# Patient Record
Sex: Male | Born: 1940 | Race: Black or African American | Hispanic: No | Marital: Married | Smoking: Former smoker
Health system: Southern US, Community
[De-identification: ages and names within clinical notes are randomized; demographics above are authoritative.]

## PROBLEM LIST (undated history)

## (undated) ENCOUNTER — Emergency Department (HOSPITAL_COMMUNITY): Admission: EM | Payer: PPO

## (undated) DIAGNOSIS — I2510 Atherosclerotic heart disease of native coronary artery without angina pectoris: Secondary | ICD-10-CM

## (undated) DIAGNOSIS — K219 Gastro-esophageal reflux disease without esophagitis: Secondary | ICD-10-CM

## (undated) DIAGNOSIS — I499 Cardiac arrhythmia, unspecified: Secondary | ICD-10-CM

## (undated) DIAGNOSIS — E785 Hyperlipidemia, unspecified: Secondary | ICD-10-CM

## (undated) DIAGNOSIS — R002 Palpitations: Secondary | ICD-10-CM

## (undated) DIAGNOSIS — K279 Peptic ulcer, site unspecified, unspecified as acute or chronic, without hemorrhage or perforation: Secondary | ICD-10-CM

## (undated) DIAGNOSIS — N400 Enlarged prostate without lower urinary tract symptoms: Secondary | ICD-10-CM

## (undated) DIAGNOSIS — M1990 Unspecified osteoarthritis, unspecified site: Secondary | ICD-10-CM

## (undated) DIAGNOSIS — I209 Angina pectoris, unspecified: Secondary | ICD-10-CM

## (undated) DIAGNOSIS — I10 Essential (primary) hypertension: Secondary | ICD-10-CM

## (undated) HISTORY — DX: Atherosclerotic heart disease of native coronary artery without angina pectoris: I25.10

## (undated) HISTORY — PX: HERNIA REPAIR: SHX51

---

## 1980-06-04 HISTORY — PX: INGUINAL HERNIA REPAIR: SUR1180

## 2000-05-29 ENCOUNTER — Encounter: Payer: Self-pay | Admitting: Orthopedic Surgery

## 2000-05-29 ENCOUNTER — Ambulatory Visit (HOSPITAL_COMMUNITY): Admission: RE | Admit: 2000-05-29 | Discharge: 2000-05-29 | Payer: Self-pay | Admitting: Orthopedic Surgery

## 2001-02-13 ENCOUNTER — Ambulatory Visit (HOSPITAL_COMMUNITY): Admission: RE | Admit: 2001-02-13 | Discharge: 2001-02-13 | Payer: Self-pay | Admitting: Family Medicine

## 2001-02-13 ENCOUNTER — Encounter: Payer: Self-pay | Admitting: Family Medicine

## 2001-03-11 ENCOUNTER — Encounter: Payer: Self-pay | Admitting: Family Medicine

## 2001-03-11 ENCOUNTER — Ambulatory Visit (HOSPITAL_COMMUNITY): Admission: RE | Admit: 2001-03-11 | Discharge: 2001-03-11 | Payer: Self-pay | Admitting: Family Medicine

## 2001-06-26 ENCOUNTER — Encounter: Payer: Self-pay | Admitting: Emergency Medicine

## 2001-06-26 ENCOUNTER — Emergency Department (HOSPITAL_COMMUNITY): Admission: EM | Admit: 2001-06-26 | Discharge: 2001-06-26 | Payer: Self-pay | Admitting: Emergency Medicine

## 2001-06-27 ENCOUNTER — Inpatient Hospital Stay (HOSPITAL_COMMUNITY): Admission: RE | Admit: 2001-06-27 | Discharge: 2001-06-29 | Payer: Self-pay | Admitting: Family Medicine

## 2003-06-05 HISTORY — PX: BACK SURGERY: SHX140

## 2003-09-03 HISTORY — PX: LUMBAR LAMINECTOMY: SHX95

## 2003-09-04 ENCOUNTER — Inpatient Hospital Stay (HOSPITAL_COMMUNITY): Admission: RE | Admit: 2003-09-04 | Discharge: 2003-09-10 | Payer: Self-pay | Admitting: Orthopedic Surgery

## 2003-09-18 ENCOUNTER — Emergency Department (HOSPITAL_COMMUNITY): Admission: EM | Admit: 2003-09-18 | Discharge: 2003-09-19 | Payer: Self-pay | Admitting: Emergency Medicine

## 2003-12-01 ENCOUNTER — Ambulatory Visit (HOSPITAL_COMMUNITY): Admission: RE | Admit: 2003-12-01 | Discharge: 2003-12-01 | Payer: Self-pay | Admitting: *Deleted

## 2003-12-01 ENCOUNTER — Encounter (INDEPENDENT_AMBULATORY_CARE_PROVIDER_SITE_OTHER): Payer: Self-pay | Admitting: *Deleted

## 2006-02-22 ENCOUNTER — Inpatient Hospital Stay (HOSPITAL_COMMUNITY): Admission: EM | Admit: 2006-02-22 | Discharge: 2006-02-23 | Payer: Self-pay | Admitting: Emergency Medicine

## 2006-02-22 HISTORY — PX: CARDIAC CATHETERIZATION: SHX172

## 2007-02-21 ENCOUNTER — Ambulatory Visit (HOSPITAL_COMMUNITY): Admission: RE | Admit: 2007-02-21 | Discharge: 2007-02-21 | Payer: Self-pay | Admitting: Family Medicine

## 2008-04-26 ENCOUNTER — Inpatient Hospital Stay (HOSPITAL_COMMUNITY): Admission: EM | Admit: 2008-04-26 | Discharge: 2008-04-28 | Payer: Self-pay | Admitting: Cardiovascular Disease

## 2008-04-26 ENCOUNTER — Encounter: Payer: Self-pay | Admitting: Emergency Medicine

## 2008-04-27 HISTORY — PX: CARDIAC CATHETERIZATION: SHX172

## 2009-10-03 HISTORY — PX: NM MYOCAR PERF WALL MOTION: HXRAD629

## 2010-03-19 ENCOUNTER — Inpatient Hospital Stay (HOSPITAL_COMMUNITY)
Admission: EM | Admit: 2010-03-19 | Discharge: 2010-03-22 | Payer: Self-pay | Source: Home / Self Care | Admitting: Emergency Medicine

## 2010-03-20 ENCOUNTER — Ambulatory Visit: Payer: Self-pay | Admitting: Internal Medicine

## 2010-03-21 ENCOUNTER — Encounter (INDEPENDENT_AMBULATORY_CARE_PROVIDER_SITE_OTHER): Payer: Self-pay | Admitting: Family Medicine

## 2010-08-16 LAB — BASIC METABOLIC PANEL
BUN: 17 mg/dL (ref 6–23)
Chloride: 104 mEq/L (ref 96–112)
Glucose, Bld: 183 mg/dL — ABNORMAL HIGH (ref 70–99)
Potassium: 4.4 mEq/L (ref 3.5–5.1)

## 2010-08-16 LAB — DIFFERENTIAL
Basophils Absolute: 0.1 10*3/uL (ref 0.0–0.1)
Basophils Relative: 1 % (ref 0–1)
Eosinophils Absolute: 0.2 10*3/uL (ref 0.0–0.7)
Eosinophils Relative: 4 % (ref 0–5)
Eosinophils Relative: 4 % (ref 0–5)
Lymphs Abs: 0.9 10*3/uL (ref 0.7–4.0)
Lymphs Abs: 1.2 10*3/uL (ref 0.7–4.0)
Monocytes Absolute: 0.6 10*3/uL (ref 0.1–1.0)
Monocytes Relative: 11 % (ref 3–12)
Monocytes Relative: 12 % (ref 3–12)
Neutro Abs: 3.3 10*3/uL (ref 1.7–7.7)

## 2010-08-16 LAB — CBC
HCT: 31.6 % — ABNORMAL LOW (ref 39.0–52.0)
Hemoglobin: 10.7 g/dL — ABNORMAL LOW (ref 13.0–17.0)
Hemoglobin: 11.4 g/dL — ABNORMAL LOW (ref 13.0–17.0)
Hemoglobin: 11.6 g/dL — ABNORMAL LOW (ref 13.0–17.0)
MCH: 29.1 pg (ref 26.0–34.0)
MCH: 29.2 pg (ref 26.0–34.0)
MCV: 86.1 fL (ref 78.0–100.0)
MCV: 86.7 fL (ref 78.0–100.0)
MCV: 86.8 fL (ref 78.0–100.0)
RBC: 3.67 MIL/uL — ABNORMAL LOW (ref 4.22–5.81)
RBC: 3.91 MIL/uL — ABNORMAL LOW (ref 4.22–5.81)
RBC: 3.97 MIL/uL — ABNORMAL LOW (ref 4.22–5.81)
WBC: 5.7 10*3/uL (ref 4.0–10.5)

## 2010-08-16 LAB — GLUCOSE, CAPILLARY
Glucose-Capillary: 107 mg/dL — ABNORMAL HIGH (ref 70–99)
Glucose-Capillary: 128 mg/dL — ABNORMAL HIGH (ref 70–99)
Glucose-Capillary: 135 mg/dL — ABNORMAL HIGH (ref 70–99)
Glucose-Capillary: 142 mg/dL — ABNORMAL HIGH (ref 70–99)
Glucose-Capillary: 144 mg/dL — ABNORMAL HIGH (ref 70–99)
Glucose-Capillary: 149 mg/dL — ABNORMAL HIGH (ref 70–99)
Glucose-Capillary: 88 mg/dL (ref 70–99)

## 2010-08-16 LAB — HEMOGLOBIN AND HEMATOCRIT, BLOOD
HCT: 32.1 % — ABNORMAL LOW (ref 39.0–52.0)
Hemoglobin: 10.4 g/dL — ABNORMAL LOW (ref 13.0–17.0)
Hemoglobin: 10.7 g/dL — ABNORMAL LOW (ref 13.0–17.0)

## 2010-08-16 LAB — POCT CARDIAC MARKERS
CKMB, poc: 1.3 ng/mL (ref 1.0–8.0)
CKMB, poc: <1 ng/mL — ABNORMAL LOW (ref 1.0–8.0)
Myoglobin, poc: 80.2 ng/mL (ref 12–200)
Troponin i, poc: <0.05 ng/mL (ref 0.00–0.09)

## 2010-08-16 LAB — TYPE AND SCREEN
ABO/RH(D): O POS
Antibody Screen: NEGATIVE

## 2010-10-17 NOTE — Cardiovascular Report (Signed)
NAMEGAUGE, WINSKI            ACCOUNT NO.:  [id number]   MEDICAL RECORD NO.:  [id number]          PATIENT TYPE:  INP   LOCATION:  2003                         FACILITY:  [facility]   PHYSICIAN:  Richard A. Alanda Amass, M.D.DATE OF BIRTH:  1940-11-12   DATE OF PROCEDURE:  04/27/2008  DATE OF DISCHARGE:                            CARDIAC CATHETERIZATION   PROCEDURES:  Retrograde central aortic catheterization, selective  coronary angiography by Judkins technique, left ventricular angiogram  LAO projection, abdominal aortic angiogram, midstream PA projection,  selective left renal angiogram, hand injection.   PROCEDURE:  The patient was brought to second floor CP lab in a  postabsorptive state after premedication of 5 mg Valium p.o.  Given 2 mg  of IV Versed for sedation in the laboratory.  A 1% Xylocaine was used  throughout the procedure.  The right groin was prepped, draped in the  usual manner.  The CRFA was entered with a single anterior puncture  using 18 thin-wall needle.  He was relatively thin and had a very  superficial vessel that appeared somewhat tortuous, but this was a  single anterior puncture.  A 6-French short sidearm sheath was inserted  without difficulty and study was done using guidewire exchange and 6-  French 4-cm taper Cordis preformed coronary and pigtail catheters.  Selective coronary angiography was performed followed by LV angiogram in  the RAO and LAO projection at 25 mL, 14 mL per second and 20 mL, 12 mL  per second.  Pullback pressure of CA was performed showed no gradient  across the aortic valve.  Catheter was pulled down above the level of  the renal arteries and abdominal aortic angiogram was done in the  midstream PA projection at 25 mL, 20 mL per second.  Because of concern  of dual, left renal arteries overlap, and possible stenosis, selective  left renal angiography was done with the 6-French right coronary  catheter.  Catheters were removed.   Side-arm sheath was flushed.  Because of relative hypertension of 160 to 170 mmHg with maintenance of  sinus rhythm.  The patient was given 20 mg of labetalol for blood  pressure elevation which brought this down to 130-140 range.  Initial  attempt at Angio-Seal closure was not successful because of the  superficial artery and a relatively stiff angle, the sheath would not  going smoothly, so this was abandoned.  A 6-French sheath was then put  back in easily and the patient was brought to the holding area for ACT  measurement, sheath removal, and pressure hemostasis.  He tolerated the  procedure well.   Pressures:  LV:  160/0; LVEDP 18 mmHg.   CA:  160/85 mmHg.   There was no gradient across the aortic valve on catheter pullback.   Fluoroscopy did not reveal any significant coronary, intracardiac, or  valvular calcification.   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  Selective left renal angiography because of  overlap showed approximately 30% narrowing of the superior left renal  artery in the proximal portion and no significant narrowing of the  posterior  segment inferior left renal artery.  The right renal artery is  single, widely patent and normal.  The IMA was intact and the proximal  iliacs appeared intact.  There was no infrarenal aneurysm or stenosis.   LV angiogram in the RAO and LAO projection showed a normally contracting  LV with angiographic LVH of at least a moderate degree and EF of  approximately 60% and no mitral regurgitation or segment.  There was  very minimal late systolic relaxation of the mid anterolateral wall  (minimal hypokinesis).   The main left coronary artery was long, widely patent, and normal.  The  coronary arteries were all quite large and tortuous except for the  affected DX1.   The LAD course to the apex of the heart, was tortuous, bifurcated.  There were irregularities with less  than 20% after SP1 and DX1 and there  was a 40% eccentric narrowing in the mid-to-distal third of the LAD, but  a large residual lumen just before the small DX2.  The remainder of the  LAD had no significant disease and normal flow.   There was a moderately long, but very thin DX1.  A normal superior  bifurcation branch, but the inferior bifurcation branch was 100%  occluded with some late filling beyond this.  This is in the area of  past angiographic stenosis at cath of 607 (70%).   There was a small, thin optional diagonal that was normal.  The  circumflex was tortuous and large, widely patent, mild irregularity in  the proximal third with normal 4 marginal branches from the AV groove,  circ, and a large atrial branch that was normal proximally.   The right coronary was a dominant vessel.  There was 30% irregularity  and narrowing of the proximal third.  The remainder of the vessel was  widely patent.  The PLA bifurcated and the PDA was single, both were  normal.  Two RV branches were normal from the proximal mid RCA.   DISCUSSION:  This 70 year old Afro American, married, father of 4 (3  living) with 13 grandchildren, is a remote smoker.  He has AODM,  hypertension, and hyperlipidemia on therapy.  He underwent  catheterization for chest pain by Dr. Tresa Endo on June 2007 and had 60-70%  narrowing of the inferior bifurcation branch of the DX1.  He has done  well on medical therapy long-term; however, over the last 3 days, he has  had intermittent chest fullness and pressure that has been unlike his  occasional reflux symptoms.  He has severe nonradiating chest pain,  prompting him going to [facility] on April 26, 2008.  He was  transferred to [facility] on the same evening.  Initial enzymes showed mild  elevation of troponin, mild elevation of CPK to 220 and MB to 126 with  troponin 1.13, all compatible with mild ischemia.  There are no acute  EKG changes, but he did have nonspecific ST-T  changes in the anterior  leads that look like repolarization and were not evolutionary on his  first EKGs.   The patient's chest pain is most likely related to occlusion of the  inferior bifurcation branch of the DX1.  This is probably completed with  a minimal wall motion abnormality of the mid anterolateral wall, well-  preserved global EF, and very mild coronary disease of the LAD and right  as outlined above.  I would recommend continued medical therapy, status  post small lateral MI with continued medical therapy of  his  hypertension, diabetes, and lipids.   Follow up with Dr. [name] and then cardiac followup with Dr.  Nanetta Batty.  We would probably need to keep him in the hospital  another 24-48 hours for cardiac monitoring.   CATHETERIZATION DIAGNOSES:  1. Unstable angina 3 days' duration resulting in small lateral      myocardial infarction from inferior bifurcation branch DX      occlusion.  2. Otherwise noncritical coronary artery disease, mild as outlined      above.  3. Systemic hypertension - left ventricular hypertrophy.  4. No significant renal artery stenosis.  5. Adult-onset diabetes mellitus.  6. Hyperlipidemia.  7. Remote smoker.  8. History of gastroesophageal reflux disease.      Richard A. Alanda Amass, M.D.  Electronically Signed     RAW/MEDQ  D:  04/27/2008  T:  04/28/2008  Job:  [id number]   cc:   Nanetta Batty, M.D.  Mila Homer. Sudie Bailey, M.D.  Cath Lab

## 2010-10-17 NOTE — Discharge Summary (Signed)
Angel Dixon, Angel Dixon            ACCOUNT NO.:  [id number]   MEDICAL RECORD NO.:  [id number]          PATIENT TYPE:  INP   LOCATION:  2003                         FACILITY:  [facility]   PHYSICIAN:  Nanetta Batty, M.D.   DATE OF BIRTH:  1940-08-05   DATE OF ADMISSION:  04/26/2008  DATE OF DISCHARGE:  04/28/2008                               DISCHARGE SUMMARY   DISCHARGE DIAGNOSES:  1. 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  2. Non-insulin-dependent diabetes.  3. Treated hypertension.  4. Treated dyslipidemia.  5. Minor coronary disease at catheterization in 2007.  6. Preserved left ventricular function.  7. SULFA allergy.   HOSPITAL COURSE:  Angel Dixon is a pleasant 70 year old male who has  been seen in the past by Dr. Allyson Sabal.  He had a catheterization in  September 2007 for chest pain.  This showed a 60-70% branch narrowing  off a diagonal.  Remainder of his coronaries were essentially without  significant stenosis.  He had good LV function and normal renal arteries  and normal iliac arteries.  He does have risk factors for coronary  disease including hypertension, dyslipidemia, and diabetes.  He is a  remote smoker.  He quit 20 years ago.  He is retired from [employer].  He does have a family history that his father died of some heart trouble  in his 73s.  The patient presented to [facility] ER on April 26, 2008  with chest pain, worrisome for unstable angina.  His initial CK was 220  with 12.6 MBs and a troponin of 1.13.  He was transferred to [facility] and  was started on heparin, nitrates, and aspirin.  The patient was pain-  free and he had no acute EKG changes, so he was not transferred until a  bed was available which was late on the April 26, 2008.  He was taken  the cath lab on April 27, 2008 by Dr. Alanda Amass.  This revealed a 30%  dominant RCA, normal left main, normal circumflex, normal  optional  diagonal, 40% narrowing in the mid LAD prior to the takeoff of the  second diagonal, large first diagonal with an occluded branch that had  previously been 60-70% in 2007.  EF was normal, he did have some LVH.  His initial CK of 200 with 12 MB was his peak.  By April 28, 2008,  his CK-MB were normal at 110 with 2.8 MB and a negative troponin.  He  has not had arrhythmias.  His EKG shows no acute changes.  We feel he  can be discharged later on April 28, 2008.  He will follow up with  Dr. Allyson Sabal in [place].  We did make some adjustments in his  medications, he had been on Pravachol at home for generic reasons, the  patient tells me he had been on Lipitor in the past without problems but  could not afford it.  Although, I had to change him over to simvastatin  which may be more effective.  He will need a followup lipid panel at  some point in about 6-8 weeks, his LDL was 104.   DISCHARGE MEDICATIONS:  1. Lisinopril 10 mg a day.  2. Glipizide 10 mg a day.  3. Aspirin 81 mg 2 tablets a day.  4. Centrum Silver once a day.  5. Ranitidine 150 mg twice a day.  6. Metoprolol ER 50 mg a day.  7. Simvastatin 80 mg a day.  8. Nitroglycerin 0.4 mg sublingual p.r.n.  9. The patient will resume his metformin 850 mg twice a day on      April 30, 2008.   LABORATORY DATA:  TSH is 1.9.  Cholesterol is 171, LDL 104, and HDL 30.  CK peaked at 220 with 12 MBs.  At discharge, his sodium was 135,  potassium 4.2, BUN 8, and creatinine 0.88.  White count 5.3, hemoglobin  13.1, hematocrit 38.4, and platelets 227.  Chest x-ray shows no active  disease.   DISPOSITION:  The patient is discharged in stable condition and followup  will be arranged with Dr. Allyson Sabal either in [place] or in [place].  He has been instructed not to drive or perform any strenuous activity  for at least a week.      Angel Dixon, P.A.      Nanetta Batty, M.D.  Electronically Signed    LKK/MEDQ   D:  04/28/2008  T:  04/29/2008  Job:  [id number]   cc:   Mila Homer. Sudie Bailey, M.D.

## 2010-10-20 NOTE — H&P (Signed)
NAME:  Angel Dixon, Angel Dixon                      ACCOUNT NO.:  [id number]   MEDICAL RECORD NO.:  [id number]                   PATIENT TYPE:  AMB   LOCATION:  DAY                                  FACILITY:  [facility]   PHYSICIAN:  Marlowe Kays, M.D.               DATE OF BIRTH:  [birth year]   DATE OF ADMISSION:  09/03/2003  DATE OF DISCHARGE:                                HISTORY & PHYSICAL   CHIEF COMPLAINT:  Pain in my back and legs.   HISTORY OF PRESENT ILLNESS:  This 70 year old black male who has been seen  by [place] for continuing progressive problems concerning his low back with  radiculitis into the lower extremities. The patient has had epidural steroid  injections in the back by Dr. Sheran Luz which unfortunately has not  truly ameliorated his discomfort. He has been tried on nonsteroidal anti-  inflammatories. 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 Quite severe stenosis was seen at  L4-5, moderate at L3-4, and mild at L2-3.  No fracture or acute abnormality  was seen other than mentioned.  Due to these continuing findings and the  fact that the patient has not responded to conservative care, it was felt  that he would benefit with surgical intervention now with central  decompressive lumbar laminectomy at L2-3, L3-4, and L4-5 with possible  diskectomy at L4-5. The potential risks and complications of the procedure  were explained to him by Dr. Marlowe Kays. We decided to go ahead with  the above procedure.   PAST MEDICAL HISTORY:  This patient has no medical allergies. His current  medications are Lipitor 40 mg one daily, Avandia 8 mg one daily, glipizide  10 mg one b.i.d., Nexium 40 mg one daily, lisinopril/hydrochlorothiazide  10/12.5 mg one daily, aspirin 81 mg per day (will stop five days prior to  surgery). He has hypertension, GERD,  hyperlipidemia, and type 2 diabetes.  His family physician is Dr. [name] in [place], [place].  Past surgical includes endoscopy about two years ago. No other major  surgeries.   FAMILY HISTORY:  Noncontributory.   SOCIAL HISTORY:  The patient is married, works for [employer]. Has no intake of tobacco products. Has moderate amount of intake of  ETOH. He has four children and his caregiver after surgery will be his wife.   REVIEW OF SYSTEMS:  CNS:  Seizures, stroke, paralysis, or double vision, but  the patient does have radiculitis consistent with spinal stenosis of the  lower extremities . No fever, chills, night sweats. RESPIRATORY:  No  productive cough, hemoptysis, or shortness of breath. CARDIOVASCULAR: No  chest pain, angina, or orthopnea. GI: No nausea, vomiting, melena, or bloody  stool. GENITOURINARY: No discharge, dysuria, or hematuria. MUSCULOSKELETAL:  Primarily in present illness.   PHYSICAL EXAMINATION:  VITAL  SIGNS: Pulse 82, respirations 12, blood  pressure 140/80.  GENERAL: Alert, cooperative, and friendly 70 year old black male who is  accompanied by his wife. He is well groomed and fully alert.  HEENT:  Normocephalic. PERRLA. Oropharynx is clear. Wears dental prosthesis.  NECK: Supple. No lymphadenopathy.  CHEST: Clear to auscultation. No rales or rhonchi.  HEART: Regular rate and rhythm.  No murmurs are heard.  ABDOMEN: Soft, nontender. Liver and spleen not felt.  RECTAL/GENITALIA: Not done; not pertinent to the present illness.  EXTREMITIES: Negative straight leg raise bilaterally.  NEUROLOGIC: Intact in lower extremities.   ADMISSION DIAGNOSES:  1. Spinal stenosis, L2-3, L3-4, and L4-5.  2. Hypertension.  3. Type 2 diabetes.  4. Hyperlipidemia.   PLAN:  The patient will undergo central decompressive lumbar laminectomy at  L2 through L5 with possible diskectomy at L4.     Dooley L. Cherlynn June.                 Marlowe Kays, M.D.    DLU/MEDQ  D:  09/02/2003  T:  09/02/2003  Job:  [id number]   cc:   Mila Homer. Sudie Bailey, M.D.  [address] [place], [place] [address]  Fax: [phone]
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
## 2010-10-20 NOTE — Consult Note (Signed)
NAME:  Angel Dixon, Angel Dixon                      ACCOUNT NO.:  [id number]   MEDICAL RECORD NO.:  [id number]                   PATIENT TYPE:  INP   LOCATION:  0473                                 FACILITY:  [facility]   PHYSICIAN:  James L. Malon Kindle., M.D.          DATE OF BIRTH:  09/09/1940   DATE OF CONSULTATION:  09/06/2003  DATE OF DISCHARGE:                                   CONSULTATION   REASON FOR CONSULTATION:  Postoperative ileus.   HISTORY:  A nice 70 year old patient of Dr. Bridgett Larsson up in [place],  who has pain in his back and legs.  He has had progressive problems and has  been treated with steroid injections, medications, etc.  He has developed  spinal stenosis.  It is felt that he would benefit from an elective  decompressive lumbar laminectomy, which was performed on April 1.  Since  that procedure, the patient has had problems passing bowel movements and has  become progressively more distended.  The most recent labs have shown a  potassium of 3.6.  He has had enemas which have helped some.  An acute  abdominal series has shown a postoperative ileus with increased air in the  colon with a small bowel.   MEDICATIONS ON ADMISSION:  Lipitor, Avandia, glipizide, Nexium, lisinopril,  hydrochlorothiazide, aspirin.   ALLERGIES:  1. SULFA.  2. VIOXX.   PAST MEDICAL HISTORY:  He has non-insulin-dependent diabetes.  This is  treated with oral agents.  He also has hypertension and esophageal reflux  disease.   He had an endoscopy a couple of years ago.  He has never had any other  abdominal surgeries.   FAMILY HISTORY:  Noncontributory.   SOCIAL HISTORY:  He is married and works for [employer].  Does not smoke.  Drinks alcohol intermittently.  Has four children.   REVIEW OF SYSTEMS:  Remarkable for some constipation but generally speaking,  no problems with his bowels.   PHYSICAL EXAMINATION:  VITAL SIGNS:  Patient is afebrile.  Vital signs are  unremarkable.  GENERAL:  An alert, oriented African-American male in no acute distress.  HEENT:  Eyes:  Sclerae are anicteric.  NECK:  Supple.  No lymphadenopathy.  LUNGS:  Clear.  HEART:  Regular rate and rhythm.  No murmurs or gallops.  ABDOMEN:  Distended with a few bowel sounds.  Soft and nontender.   ASSESSMENT:  Postoperative ileus, probably due to the combination of a  spinal surgery and hypokalemia.   PLAN:  Will continue on clear liquids and will try to get his potassium up  to 4.5 to 5 for several days and see how he does clinically.                                               James L. Malon Kindle.,  M.D.    JLE/MEDQ  D:  09/06/2003  T:  09/07/2003  Job:  [id number]   cc:   Marlowe Kays, M.D.  [address]  [place]  [place] [address]  Fax: [phone]   Lacy Duverney, M.D.

## 2010-10-20 NOTE — Group Therapy Note (Signed)
[facility]  Patient:    Angel Dixon, Angel Dixon Visit Number: [id number] MRN: [id number]          Service Type: MED Location: 2A A226 01 Attending Physician:  [name] Dictated by:   [name], M.D. Admit Date:  06/27/2001                               Progress Note  SUBJECTIVE:  The patient is really feeling better.  He is in no distress, no pain.  OBJECTIVE:  Temperature 99.3, pulse 81, respiratory rate 20, blood pressure 122/74.  He is supine in bed, oriented, and alert, in no acute distress. Heart has a regular rhythm without murmur, rate of 70.  The lungs are clear throughout, moving air well.  The abdomen is soft, without hepatosplenomegaly or mass.  No tenderness.  Bowel sounds normal.  Extremities normal.  Hemoglobin today is 9.9.  He had an EGD through Dr. Karilyn Cota, gastroenterologist, who found on this two bulbar ulcers with stigmata of bleed.  These were coagulated.  There was also erosive gastritis.  Also, problem #2 is diabetes.  Accu-Chek today 169.  PLAN:  Continue with liquids.  Start a 4-gram sodium diet.  If his hemoglobin is stable in the morning, he will be able to be discharged home. Dictated by:   [name], M.D. Attending Physician:  [name] DD:  06/28/01 TD:  06/29/01 Job: [id number]

## 2010-10-20 NOTE — Consult Note (Signed)
[facility]  Patient:    Angel Dixon, Angel Dixon Visit Number: [id number] MRN: [id number]          Service Type: MED Location: 2A A226 01 Attending Physician:  Angel Dixon Dictated by:   Angel Dixon, M.D. Proc. Date: 06/27/01 Admit Date:  06/27/2001                            Consultation Report  REASON FOR CONSULTATION:  Melena and anemia.  HISTORY OF PRESENT ILLNESS:  [name] is a 70 year old African-American male who was admitted to [facility] service this afternoon with weakness, melena, and anemia.  The patient was in his usual state of health until yesterday afternoon when he was at work.  He lifted a box of cigarettes and he felt very light-headed and dizzy.  This passed within a few minutes and he was able to finish his shift. He drove back home.  When he sat down to eat his evening meal, he did not feel well and he did not have a good appetite.  However, he did not throw up or have abdominal pain.  He did have just one episode of chest tightness which resolved spontaneously.  Later in the evening, he stood up and he had very transient syncopal spell.  His wife helped him.  He stood up and slumped over to the ground a second time.  He did not have a frank fall.  He was brought to the emergency room by ambulance.  He was evaluated.  His hemoglobin was in the 11 range.  He was given IV fluids.  He felt better and he was discharged with advice to follow with Angel Dixon.  He did have tarry stool this morning. Angel Dixon did a rectal exam and noted his stool to be black and strongly guaiac positive.  He has not had a spell like that but he still feels weak and is not hungry.  He denies heartburn, dysphagia, hematemesis, abdominal pain, fresh blood per rectum, or recent change in his bowel habits.  He has a good appetite.  He had back problems a couple of months ago.  He was evaluated by neurosurgeon.  He received three epidural  shots with significant relief of his pain.  He also was begun on Naprosyn 500 mg but he does not take it every day. He is also on ASA 81 mg q.d. and Anacin on a p.r.n. basis.  Other medications include Prinivil ? 10 mg q.d. and glipizide q.d.  CURRENT MEDICATIONS: 1. Naprosyn 500 mg. 2. ASA 81 mg q.d. 3. Anacin on a p.r.n. basis. 4. Prinivil ? 10 mg q.d. 5. Glipizide q.d.  REVIEW OF SYSTEMS:  Negative for shortness of breath, fever or chills, hematuria, or dysuria.  There is no history of peptic ulcer disease.  PAST MEDICAL HISTORY: 1. Hypertension. 2. Diabetes mellitus.  Both of these were diagnosed about five years ago. 3. About four and a half years ago he had a screening flexible sigmoidoscopy    and ______ which was normal. 4. He had a right inguinal herniorrhaphy years ago. 5. 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 6. He had a stress test about three months ago which was within normal limits.  ALLERGIES:  SULFA caused rash and itching.  He is allergic to another medication but does not remember the name.  FAMILY HISTORY:  Both parents are  deceased.  They died in their 50s.  Father had some heart problems and mother had renal problems.  SOCIAL HISTORY:  He is married.  He has three daughters and one son.  One of his daughters is diabetic.  She is on oral hypoglycemics and takes insulin on a p.r.n. basis.  Other children are in good health.  He quit cigarette smoking 13 years ago after having smoked for 25-30 years.  He drinks alcohol no more than a couple of drinks a month.  PHYSICAL EXAMINATION:  GENERAL:  The patient is a pleasant well-developed, well-nourished African-American male who is in no acute distress.  VITAL SIGNS:  His usual weight is 165 pounds.  He has not been weighed yet. He is 5 feet 8 inches tall.  Pulse 112 per minute, blood pressure 123/80, respirations 20, and temperature 97.8.  He was noted to  be orthostatic in [facility] Dixon office.  HEENT:  Conjunctivae is pale.  Sclerae is nonicteric.  Oropharyngeal mucosa is normal.  He has complete upper and partial lower dental plate.  NECK:  Without masses or thyromegaly.  JVD is flat.  Carotids are 2+ bilaterally and without bruits.  CARDIAC:  Regular rhythm, normal S1 and S2.  No murmur or gallop noted.  LUNGS:  Clear to auscultation.  ABDOMEN:  Symmetrical bowel sounds and normal.  Palpation reveals a soft abdomen.  He has mild mid epigastric tenderness but no organomegaly or masses.  RECTAL:  Deferred as he had one by Angel Dixon today.  EXTREMITIES:  He does not have clubbing or koilonychia.  LABORATORY DATA:  Are pending.  ASSESSMENT:  Angel Dixon is a 70 year old African-American male who presents with melena and anemia with associated symptoms of weakness and lightheadedness.  He has been on Naprosyn p.r.n. and he also takes low dose aspirin and p.r.n. Anacin.  I suspect he has upper gastrointestinal bleed secondary to peptic ulcer disease due to nonsteroidal anti-inflammatory drug therapy.  Whether or not he has Helicobacter pylori infection remains to be seen.  I suspect his hemoglobin has dropped significantly since yesterday as he has a resting sinus tachycardia.  He needs to undergo esophagogastroduodenoscopy both for diagnostic and therapeutic purposes. Unfortunately, he decided to eat some food before he came to the hospital and therefore it would be best for me to wait until tomorrow morning unless he were to show signs of active bleeding.  PLAN: 1. I agree with the use of IV proton pump inhibitors and IV fluids. 2. Will consider packed red blood cells if hemoglobin drops below 8 grams. 3. Esophagogastroduodenoscopy in the a.m. 4. I have reviewed the procedure with the patient and his wife, and they are    agreeable.  I would like to thank Angel Dixon for sharing the care of this gentleman. Dictated by:    Angel Dixon, M.D. Attending Physician:  Angel Dixon DD:  06/27/01  TD:  06/28/01 Job: [id number]

## 2010-10-20 NOTE — Op Note (Signed)
NAME:  Angel Dixon, Angel Dixon                      ACCOUNT NO.:  [id number]   MEDICAL RECORD NO.:  [id number]                   PATIENT TYPE:  OBV   LOCATION:  0460                                 FACILITY:  [facility]   PHYSICIAN:  Marlowe Kays, M.D.               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   POSTOPERATIVE DIAGNOSES:  Severe spinal stenosis L2-3, L3-4, and L4-5.   OPERATION:  Central decompressive laminectomy L2-3, L3-4, L4-5 right with  inspection of the L4-5 disk right.   SURGEON:  Marlowe Kays, M.D.   ASSISTANT:  Georges Lynch. Darrelyn Hillock, M.D.   ANESTHESIA:  General.   PATHOLOGY AND JUSTIFICATION FOR PROCEDURE:  He has had progressive back and bilateral leg pain.  He had a myelogram done  actually in 2001 which demonstrated three level spinal stenosis most severe  at L4-5.  Because of the progression of symptoms, he is here today for the  above mentioned surgery. A recent MRI on August 11, 2003 demonstrated disk  protrusion on the right at L4-5 which I do not feel was the major  contributor of symptoms but we felt that it should be looked at anyway.   DESCRIPTION OF PROCEDURE:  Prophylactic antibiotics, satisfactory general  anesthesia, Foley catheter inserted, knee chest position on the Andrew's  frame. The back was prepped with Duraprep, draped in a sterile field. I made  an initial midline incision and tagged the two spinous processes in the  middle of the field with Kocher clamps and then took a lateral x-ray which  was co-read by the radiologist as well since this man has a transitional  vertebra at L5-S1.  It was felt by both Dr. Darrelyn Hillock and me and the  radiologist that the Kocher clamps were on the spinous process of L2 and L3.  I therefore did not do anymore  dissection proximal ward but opened the wound  caudad until I was able to dissect down to the L4-5 disk space. Two self  retaining McCullough retractors were inserted. With a combination of double  action rongeur and 2 and 3 mm Kerrison rongeurs, I first removed the spinous  processes and a portion of the neural arches of L2 except for the most  superior portion down to L5 removing the superior portion.  Then working in  the midline with the Kerrison rongeurs, we meticulously and carefully began  decompressing the spinal canal which was severely compressed by bone.  When  we had freed up as much of the meninges as we felt comfortable without using  the microscope, we brought in the microscope and completed the decompression  particularly at L4-5.  He had a very thick ligamentum flavum which was a  major contributor to his  compression. The neuroforamen bilaterally at L2-3,  L3-4 and L4-5 were all widely patent at the conclusion of the decompression.  We looked at the L4-5 disk. This appeared to be flat to Bristol 4 pressure  and the neuroforamen for the L5 nerve root was widely patent and it was not  felt that any microdiskectomy was indicated. I then irrigated the wound well  with sterile saline and Gelfoam was placed over the dura. I then placed a  1/4 inch Penrose drain through the left posterior flank on top of the  Gelfoam.  Self retaining retractors were removed. There was no unusual  bleeding.  I then closed the wound with interrupted #1 Vicryl in the  paralumbar muscle and fascia taking care under direct visualization to  avoid piercing the drain. The subcutaneous tissue was closed with 2-0 Vicryl  and skin with staples.  Betadine Adaptic dry sterile dressing were applied,  he was gently placed on his bed and taken to the recovery room in  satisfactory condition with no known complications. Estimated blood loss was  450 mL, no blood replacement.                                                Marlowe Kays, M.D.    JA/MEDQ  D:  09/03/2003  T:  09/04/2003  Job:  [id number]
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
## 2010-10-20 NOTE — Cardiovascular Report (Signed)
NAMESIDNEY, KANN NO.:  [id number]   MEDICAL RECORD NO.:  [id number]          PATIENT TYPE:  INP   LOCATION:  2807                         FACILITY:  [facility]   PHYSICIAN:  Nicki Guadalajara, M.D.     DATE OF BIRTH:  08-Jun-1940   DATE OF PROCEDURE:  DATE OF DISCHARGE:                              CARDIAC CATHETERIZATION   Audio too short to transcribe (less than 5 seconds)           ______________________________  Nicki Guadalajara, M.D.     TK/MEDQ  D:  02/22/2006  T:  02/22/2006  Job:  [id number]

## 2010-10-20 NOTE — Discharge Summary (Signed)
NAME:  DIMETRIUS, MONTFORT                      ACCOUNT NO.:  [id number]   MEDICAL RECORD NO.:  [id number]                   PATIENT TYPE:  INP   LOCATION:  0473                                 FACILITY:  [facility]   PHYSICIAN:  Marlowe Kays, M.D.               DATE OF BIRTH:  1941-03-12   DATE OF ADMISSION:  09/03/2003  DATE OF DISCHARGE:  09/10/2003                                 DISCHARGE SUMMARY   ADMISSION DIAGNOSES:  1. Spinal stenosis, L2-3, L3-4, and L4-5.  2. Hypertension.  3. Type 2 diabetes.  4. Hyperlipidemia.   DISCHARGE DIAGNOSES:  1. Spinal stenosis, L2-3, L3-4, and L4-5.  2. Hypertension.  3. Type 2 diabetes.  4. Hyperlipidemia.  5. Postoperative anemia treated with two units of autologous blood.  6. Postoperative ileus (resolved).  7. Hyponatremia (resolved).  8. Tinea cruris, treated with Nystatin.   OPERATION:  On September 03, 2003, the patient underwent central decompressive  lumbar laminectomy at L2-3, L3-4, and L4-5 on the right with inspection of  the L4 disk on the right. Dr. Ranee Gosselin assisted.   CONSULTATIONS:  Dr. Randa Evens and Dr. Luther Parody, gastroenterology.   BRIEF HISTORY:  This 70 year old black male seen for continuing problems  concerning his low back pain. He has radiation of pain and discomfort in  both lower extremities. He has failed conservative care including epidural  steroid injections, nonsteroidal anti-inflammatories.  MRI showed the three  level spinal stenosis. After potential risks and complications, the surgery  was discussed with the patient who decided to go ahead with the above  procedure.   HOSPITAL COURSE:  The patient tolerated the surgical procedure quite well.  He had marked relief in his leg pain. He had some soreness in his back. A  Penrose drain, which was placed postoperatively, was advanced and then  pulled over the weekend after surgery.  He had slight elevation in  temperature, and Tylenol relieved it. He has no  pulmonary symptomatology.  It was noted on postoperative day two, however, that he had complaints of  stomach tightness. The weekend call people noted that he had some distention  beginning. Mr. Clarene Reamer, PA-C, began the patient on ileus precautions.  Unfortunately,the patient went on to develop a full postoperative ileus with  KUB showing ileus pattern (this was repeated after treatment and was  resolving). Unfortunately, the patient did not respond in a timely fashion  to the protocol that we had given him. He had quiet bowel sounds, no flatus,  and certainly no BM. We asked Dr. Randa Evens and his group to see the patient.  It was noted that the patient had hyponatremia. This was corrected,  potassium was added as well as simethicone. Eventually, the patient began  having passing of flatus. Various suppositories and enemas also caused him  to have stool. He emptied stool the day prior to discharge. He was much more  comfortable. Bowel sounds were present. The  abdomen was soft at discharge.  Postoperatively, it was noted that the patient had a postoperative anemia  with his hemoglobin dropping down to 7.9. He was ashen, pale, and light-  headed. It was felt that indeed a postoperative anemia was present and  therefore discussed it with both him and his wife concerning transfusion. He  was transfused with two units of bank blood. This brought his hemoglobin up  to 10.1. He has less dizziness, felt better.  The patient also had an  intertriginous groin monilial infection and this was treated with Nystatin  powder.  On the day of discharge the patient had very little pain or  discomfort in the back or lower extremities. He was ambulating in the hall  as he had been postoperatively. His abdomen was soft, bowel sounds present.  He was voiding on his own and had had stool. The wound was dry.  Neurovascularly was intact in the lower extremities. Both him and his wife  in the room for discharge  instructions. He has passed a considerable amount  of flatus during the night.   Laboratory values in the hospital, hematologically, showed a preoperative  CBC with a very mild anemia, hemoglobin 12.7, hematocrit 38.0; otherwise  within normal limits.  Preoperative chemistry was normal with a glucose of  135.  Final sodium was 133 and glucose was normal at 88, BUN 33, creatinine  2.6. Urinalysis was negative for urinary tract infection.  Preoperative  chest x-ray showed no active lung disease. The KUB x-ray on September 05, 2003,  showed ileus pattern and when repeated on September 08, 2003, showed improving  postoperative ileus.  Electrocardiogram showed normal sinus rhythm with left  atrial enlargement.   CONDITION ON DISCHARGE:  Improved, stable.   PLAN:  The patient is discharged to his home in the care of his wife and  family. He is given both verbally and written explicit instructions to  return to see Dr. Simonne Come on September 17, 2003.  He is to use dry dressing  p.r.n. to the back, he may shower. He is to ambulate for comfort, be seated  for comfort, and supine for comfort.   He is to follow up with Dr. Sudie Bailey, his primary care physician, next week.  I told them he would call for an  appointment so he can be back on the  diabetic regimen Dr. Sudie Bailey has had him on.  We had to put him on sliding  scale in the hospital due to his NPO status.   The patient is follow up with Dr. Luther Parody in the not too distant future.  Dr. Luther Parody feels that the patient may have had a recurrence of an ulcer  problem as he had stool for positive blood times one while in the hospital.  He is recommended an upper endoscopy. I instructed him to call Dr. Luther Parody  for an  appointment at [phone]. The patient is to continue with his home  medications and diet per Dr. Michelle Nasuti office.  MEDICATIONS:  Tylox #30 one q.6h. p.r.n. pain and Robaxin 500 mg one q.6h.  p.r.n. muscle spasm. They are to buy  over-the-counter Gas-X (simethicone)  should he have increased gas. He is to use laxatives p.r.n.  By the time of  discharge he was on a regular diet.     Dooley L. Cherlynn June.                 Marlowe Kays, M.D.    DLU/MEDQ  D:  09/10/2003  T:  09/10/2003  Job:  [id number]   cc:   Mila Homer. Sudie Bailey, M.D.  [address] [place], [place] [address]  Fax: [phone]

## 2010-10-20 NOTE — Cardiovascular Report (Signed)
Angel Dixon, SALOMON NO.:  [id number]   MEDICAL RECORD NO.:  [id number]          PATIENT TYPE:  INP   LOCATION:  2034                         FACILITY:  [facility]   PHYSICIAN:  Nicki Guadalajara, M.D.     DATE OF BIRTH:  [birth year]   DATE OF PROCEDURE:  02/22/2006  DATE OF DISCHARGE:                              CARDIAC CATHETERIZATION   INDICATIONS:  Mr. [name] is a 70 year old, African American male  who is a patient Dr. Allyson Sabal, who has a history of hypertension,  hyperlipidemia, diabetes mellitus as well as significant premature family  history of coronary disease.  He has noticed some episodes of some mild  chest tightness.  Prior nuclear stress study has suggested inferior  thinning.  Apparently this morning, he developed a significant episode of  palpitation with chest tightness associated with shortness of breath.  He  ultimately presented to the emergency room.  His ECG suggested showed new,  small Q waves inferiorly of questionable significance.  With his significant  cardiac risk factor profile and an episode of significant chest pressure  this morning, definitive diagnostic cardiac catheterization was recommended.   PROCEDURE:  After premedication with Valium 5 mg intravenously, the patient  prepped and draped in usual fashion.  His right femoral artery was punctured  anteriorly and a 5-French sheath was inserted without difficulty.  Diagnostic catheterization was done utilizing 5-French Judkins-4, left and  right diagnostic catheters.  A pigtail catheter was used for RAO  ventriculography.  With his hypertensive history and diabetes mellitus,  distal aortography was also performed.  He tolerated procedure well.  Hemostasis was obtained by direct manual pressure.   HEMODYNAMIC DATA:  Central aortic pressure is 132/75, mean 99, left  ventricular pressure is 132/13.   ANGIOGRAPHIC DATA:  Left main coronary artery was a very long, normal  angiographically normal vessel which trifurcated into LAD, small to moderate  intermediate vessel and left circumflex system.   The LAD proper was angiographically normal and extended around the LV apex.  The LAD gave rise to a first diagonal vessel and the inferior branch of this  diagonal vessel had 60-70% focal narrowing.  The remainder of the LAD and  septal and diagonal vessels were normal.   The intermediate vessel was small to moderate size and was angiographically  normal.   The circumflex vessel was angiographically normal and gave rise to several  obtuse marginal vessels and ended in a posterolateral like vessel.   The right coronary artery was moderate size vessel that gave rise to the  PDA, several inferior LV branches and small PLA system.  There was mild  smooth 10% narrowing in the mid segment.   RAO ventriculography revealed normal LV contractility with an ejection  fraction of at least 65% without focal segmental wall motion abnormalities.   Distal aortography revealed a normal aortoiliac system without evidence for  renal artery stenosis.   IMPRESSION:  1. Normal left ventricular function.  2. 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  3. Normal intermediate vessel.  4. Normal left circumflex vessel.  5. Smooth 10% narrowing in the mid right coronary artery.   RECOMMENDATIONS:  Medical therapy with increased beta-blocker for his  documented PVCs and palpitations leading to presentation.           ______________________________  Nicki Guadalajara, M.D.     TK/MEDQ  D:  02/22/2006  T:  02/25/2006  Job:  [id number]   cc:   Nanetta Batty, M.D.

## 2010-10-20 NOTE — Discharge Summary (Signed)
Angel Dixon, Angel Dixon            ACCOUNT NO.:  [id number]   MEDICAL RECORD NO.:  [id number]          PATIENT TYPE:  INP   LOCATION:  2034                         FACILITY:  [facility]   PHYSICIAN:  Cristy Hilts. Jacinto Halim, MD       DATE OF BIRTH:  Oct 20, 1940   DATE OF ADMISSION:  02/22/2006  DATE OF DISCHARGE:  02/23/2006                                 DISCHARGE SUMMARY   SUBJECTIVE:  Angel Dixon is a 70 year old African-American male patient  who came to the ER per referral of our office.  He is a patient of Dr.  Hazle Coca.  He has known hypertension, diabetes, hyperlipidemia.  He came to  the emergency room visit for his recurrence of chest tightness with  shortness of breath, a rumbling in his chest and skip beats.  He had no  diaphoresis, no shortness of breath.  It did not last long.  He has had  recurrence while he has been in the ER.  His EKG shows questionable new Q  waves in inferior leads since June of 2007.  The EKG in the office, his  point of care of markers x1 were negative.  He was seen by Dr. Tresa Endo. It was  decided since he had a treadmill Cardiolite July 17, 2005, which showed  no ischemia, inferior thinning with a normal EF, that it was decided he  should undergo coronary artery catheterization for definite diagnosis.  He  also drives a forklift at work.  Cath was performed February 22, 2006, it  showed no major obstructive disease.  He had 10% in his RCA.  He had 60% to  70% in SI branch; it was diagonal.  Continued primary medical therapy was  recommended.  He was seen in the morning of February 23, 2006.  His groin  was without any bruit or hematoma.  His blood pressure was 116/71, his pulse  was 70, his respirations were 20 and his temperature was 97.1.   LABS:  Sodium 139, potassium 3.9, BUN 11, creatinine 1.1 and his hemoglobin  was 13.6, his hematocrit 40, his WBCs 5.4, his platelets 260, INR was 1.1,  his PTT was 37.   Chest x-ray showed no acute distress.   DISCHARGE MEDICATIONS:  Same meds as prior to hospitalization:  1. Glipizide 10 mg two times per day.  2. Lipitor 80 mg one time per day.  3. Lisinopril 20 mg one time per day.  4. Metformin 500 mg three times per day; he should hold it until Monday.  5. Aspirin 81 mg one time per day.  6. Nexium 40 mg one time per day.  7. Toprol XL 25 mg a day.   He should follow up Dr. Allyson Sabal in two weeks, he should call Monday for an  appointment.   DISCHARGE DIAGNOSES:  1. Chest pain noncardiac, probable musculoskeletal.  2. Palpitations.  3. Hypertension.  4. Hyperlipidemia.  5. Non-insulin-dependent diabetes mellitus.  6. Premature family history of heart disease.      Lezlie Octave, N.P.      Cristy Hilts. Jacinto Halim, MD  Electronically Signed    BB/MEDQ  D:  02/23/2006  T:  02/23/2006  Job:  [id number]   cc:   Mila Homer. Sudie Bailey, M.D.

## 2011-03-06 LAB — CBC
HCT: 39.6
MCHC: 32.9
MCHC: 34
MCV: 88.8
Platelets: 261
Platelets: 227
Platelets: 234
RBC: 4.45
RDW: 13
RDW: 13.1
RDW: 13.3
RDW: 13.6
WBC: 6.1

## 2011-03-06 LAB — BASIC METABOLIC PANEL
CO2: 29
Calcium: 8.8
Calcium: 9.3
Creatinine, Ser: 0.93
GFR calc Af Amer: >60
GFR calc Af Amer: >60
GFR calc non Af Amer: >60
Potassium: 4.2
Sodium: 135

## 2011-03-06 LAB — GLUCOSE, CAPILLARY
Glucose-Capillary: 141 — ABNORMAL HIGH
Glucose-Capillary: 167 — ABNORMAL HIGH
Glucose-Capillary: 230 — ABNORMAL HIGH

## 2011-03-06 LAB — CARDIAC PANEL(CRET KIN+CKTOT+MB+TROPI)
CK, MB: 10.3 — ABNORMAL HIGH
CK, MB: 2.8
Relative Index: 2.5
Relative Index: 5.3 — ABNORMAL HIGH
Total CK: 110
Troponin I: 0.68
Troponin I: 1.2

## 2011-03-06 LAB — DIFFERENTIAL
Basophils Absolute: 0
Lymphocytes Relative: 24
Monocytes Absolute: 1.1 — ABNORMAL HIGH
Neutro Abs: 3.6

## 2011-03-06 LAB — COMPREHENSIVE METABOLIC PANEL
ALT: 33
AST: 34
Albumin: 3.8
Albumin: 3.7
Alkaline Phosphatase: 72
BUN: 18
Chloride: 104
Creatinine, Ser: 1.24
GFR calc Af Amer: >60
Potassium: 4.1
Sodium: 136
Total Bilirubin: 0.9
Total Protein: 7
Total Protein: 6.6

## 2011-03-06 LAB — POCT CARDIAC MARKERS
CKMB, poc: 6
Myoglobin, poc: 120
Troponin i, poc: 0.24 — ABNORMAL HIGH
Troponin i, poc: 0.33

## 2011-03-06 LAB — PROTIME-INR
INR: 1
Prothrombin Time: 13.7

## 2011-03-06 LAB — HEPARIN LEVEL (UNFRACTIONATED)
Heparin Unfractionated: 0.37
Heparin Unfractionated: 0.5
Heparin Unfractionated: <0.1 — ABNORMAL LOW

## 2011-03-06 LAB — LIPID PANEL
LDL Cholesterol: 104 — ABNORMAL HIGH
Triglycerides: 184 — ABNORMAL HIGH

## 2011-05-11 ENCOUNTER — Encounter: Payer: Self-pay | Admitting: *Deleted

## 2011-05-11 ENCOUNTER — Other Ambulatory Visit: Payer: Self-pay

## 2011-05-11 ENCOUNTER — Emergency Department (HOSPITAL_COMMUNITY): Payer: Medicare Other

## 2011-05-11 ENCOUNTER — Inpatient Hospital Stay (HOSPITAL_COMMUNITY)
Admission: EM | Admit: 2011-05-11 | Discharge: 2011-05-14 | DRG: 287 | Disposition: A | Discharge status: Home / Self Care | Payer: Medicare Other | Attending: Internal Medicine | Admitting: Internal Medicine

## 2011-05-11 DIAGNOSIS — I493 Ventricular premature depolarization: Secondary | ICD-10-CM | POA: Diagnosis present

## 2011-05-11 DIAGNOSIS — I252 Old myocardial infarction: Secondary | ICD-10-CM

## 2011-05-11 DIAGNOSIS — E785 Hyperlipidemia, unspecified: Secondary | ICD-10-CM | POA: Diagnosis present

## 2011-05-11 DIAGNOSIS — R0789 Other chest pain: Principal | ICD-10-CM | POA: Diagnosis present

## 2011-05-11 DIAGNOSIS — Z8249 Family history of ischemic heart disease and other diseases of the circulatory system: Secondary | ICD-10-CM

## 2011-05-11 DIAGNOSIS — Z79899 Other long term (current) drug therapy: Secondary | ICD-10-CM

## 2011-05-11 DIAGNOSIS — I2510 Atherosclerotic heart disease of native coronary artery without angina pectoris: Secondary | ICD-10-CM | POA: Diagnosis present

## 2011-05-11 DIAGNOSIS — I10 Essential (primary) hypertension: Secondary | ICD-10-CM | POA: Diagnosis present

## 2011-05-11 DIAGNOSIS — Z7982 Long term (current) use of aspirin: Secondary | ICD-10-CM

## 2011-05-11 DIAGNOSIS — I249 Acute ischemic heart disease, unspecified: Secondary | ICD-10-CM | POA: Diagnosis present

## 2011-05-11 DIAGNOSIS — I4949 Other premature depolarization: Secondary | ICD-10-CM | POA: Diagnosis present

## 2011-05-11 DIAGNOSIS — E119 Type 2 diabetes mellitus without complications: Secondary | ICD-10-CM | POA: Insufficient documentation

## 2011-05-11 DIAGNOSIS — R002 Palpitations: Secondary | ICD-10-CM | POA: Diagnosis present

## 2011-05-11 HISTORY — PX: CARDIAC CATHETERIZATION: SHX172

## 2011-05-11 HISTORY — DX: Cardiac arrhythmia, unspecified: I49.9

## 2011-05-11 HISTORY — DX: Peptic ulcer, site unspecified, unspecified as acute or chronic, without hemorrhage or perforation: K27.9

## 2011-05-11 HISTORY — DX: Hyperlipidemia, unspecified: E78.5

## 2011-05-11 HISTORY — DX: Angina pectoris, unspecified: I20.9

## 2011-05-11 HISTORY — DX: Essential (primary) hypertension: I10

## 2011-05-11 HISTORY — DX: Unspecified osteoarthritis, unspecified site: M19.90

## 2011-05-11 HISTORY — DX: Gastro-esophageal reflux disease without esophagitis: K21.9

## 2011-05-11 LAB — COMPREHENSIVE METABOLIC PANEL
ALT: 29 U/L (ref 0–53)
AST: 24 U/L (ref 0–37)
Albumin: 3.8 g/dL (ref 3.5–5.2)
Alkaline Phosphatase: 100 U/L (ref 39–117)
BUN: 15 mg/dL (ref 6–23)
CO2: 27 mEq/L (ref 19–32)
Calcium: 9.6 mg/dL (ref 8.4–10.5)
Chloride: 101 mEq/L (ref 96–112)
Creatinine, Ser: 0.84 mg/dL (ref 0.50–1.35)
GFR calc Af Amer: >90 mL/min (ref >90)
GFR calc non Af Amer: 87 mL/min — ABNORMAL LOW (ref >90)
Glucose, Bld: 76 mg/dL (ref 70–99)
Potassium: 4.1 mEq/L (ref 3.5–5.1)
Sodium: 140 mEq/L (ref 135–145)
Total Bilirubin: 0.4 mg/dL (ref 0.3–1.2)
Total Protein: 7.2 g/dL (ref 6.0–8.3)

## 2011-05-11 LAB — DIFFERENTIAL
Basophils Absolute: 0.1 10*3/uL (ref 0.0–0.1)
Basophils Relative: 1 % (ref 0–1)
Eosinophils Absolute: 0.5 10*3/uL (ref 0.0–0.7)
Eosinophils Relative: 7 % — ABNORMAL HIGH (ref 0–5)
Lymphocytes Relative: 30 % (ref 12–46)
Lymphs Abs: 2 10*3/uL (ref 0.7–4.0)
Monocytes Absolute: 0.7 10*3/uL (ref 0.1–1.0)
Monocytes Relative: 11 % (ref 3–12)
Neutro Abs: 3.3 10*3/uL (ref 1.7–7.7)
Neutrophils Relative %: 51 % (ref 43–77)

## 2011-05-11 LAB — CBC
HCT: 36.1 % — ABNORMAL LOW (ref 39.0–52.0)
Hemoglobin: 12.2 g/dL — ABNORMAL LOW (ref 13.0–17.0)
Hemoglobin: 11.8 g/dL — ABNORMAL LOW (ref 13.0–17.0)
MCH: 28.4 pg (ref 26.0–34.0)
MCH: 27.5 pg (ref 26.0–34.0)
MCHC: 32.7 g/dL (ref 30.0–36.0)
MCV: 83.9 fL (ref 78.0–100.0)
MCV: 84.1 fL (ref 78.0–100.0)
Platelets: 293 10*3/uL (ref 150–400)
Platelets: 321 10*3/uL (ref 150–400)
RBC: 4.29 MIL/uL (ref 4.22–5.81)
RBC: 4.29 MIL/uL (ref 4.22–5.81)
RDW: 12.7 % (ref 11.5–15.5)
WBC: 5.2 10*3/uL (ref 4.0–10.5)
WBC: 6.6 10*3/uL (ref 4.0–10.5)

## 2011-05-11 LAB — BASIC METABOLIC PANEL
CO2: 28 mEq/L (ref 19–32)
Calcium: 10 mg/dL (ref 8.4–10.5)
Chloride: 98 mEq/L (ref 96–112)
Creatinine, Ser: 0.85 mg/dL (ref 0.50–1.35)
Glucose, Bld: 163 mg/dL — ABNORMAL HIGH (ref 70–99)
Sodium: 136 mEq/L (ref 135–145)

## 2011-05-11 LAB — APTT: aPTT: 36 seconds (ref 24–37)

## 2011-05-11 LAB — CARDIAC PANEL(CRET KIN+CKTOT+MB+TROPI)
CK, MB: 2.3 ng/mL (ref 0.3–4.0)
Relative Index: 1.8 (ref 0.0–2.5)
Total CK: 130 U/L (ref 7–232)
Troponin I: <0.3 ng/mL (ref <0.30)

## 2011-05-11 LAB — PROTIME-INR
INR: 1.03 (ref 0.00–1.49)
Prothrombin Time: 13.7 seconds (ref 11.6–15.2)

## 2011-05-11 LAB — MAGNESIUM: Magnesium: 2 mg/dL (ref 1.5–2.5)

## 2011-05-11 LAB — GLUCOSE, CAPILLARY: Glucose-Capillary: 123 mg/dL — ABNORMAL HIGH (ref 70–99)

## 2011-05-11 LAB — POCT I-STAT TROPONIN I: Troponin i, poc: 0 ng/mL (ref 0.00–0.08)

## 2011-05-11 MED ORDER — METOPROLOL TARTRATE 25 MG PO TABS
25.0000 mg | ORAL_TABLET | Freq: Two times a day (BID) | ORAL | Status: DC
  Administered 2011-05-11: 25 mg via ORAL
  Filled 2011-05-11 (×3): qty 1

## 2011-05-11 MED ORDER — ASPIRIN EC 81 MG PO TBEC
81.0000 mg | DELAYED_RELEASE_TABLET | Freq: Every day | ORAL | Status: DC
  Administered 2011-05-12 – 2011-05-13 (×2): 81 mg via ORAL
  Filled 2011-05-11 (×4): qty 1

## 2011-05-11 MED ORDER — INSULIN ASPART 100 UNIT/ML ~~LOC~~ SOLN
0.0000 [IU] | Freq: Three times a day (TID) | SUBCUTANEOUS | Status: DC
  Administered 2011-05-12 (×3): 2 [IU] via SUBCUTANEOUS
  Administered 2011-05-13: 1 [IU] via SUBCUTANEOUS
  Administered 2011-05-13: 2 [IU] via SUBCUTANEOUS
  Filled 2011-05-11 (×2): qty 3

## 2011-05-11 MED ORDER — LISINOPRIL 10 MG PO TABS
10.0000 mg | ORAL_TABLET | Freq: Every day | ORAL | Status: DC
  Administered 2011-05-12 – 2011-05-14 (×3): 10 mg via ORAL
  Filled 2011-05-11 (×4): qty 1

## 2011-05-11 MED ORDER — NITROGLYCERIN 0.4 MG SL SUBL: 0.4000 mg | SUBLINGUAL_TABLET | SUBLINGUAL | Status: DC | PRN

## 2011-05-11 MED ORDER — SALINE NASAL SPRAY 0.65 % NA SOLN
1.0000 | NASAL | Status: DC | PRN
  Filled 2011-05-11 (×3): qty 30

## 2011-05-11 MED ORDER — ZOLPIDEM TARTRATE 5 MG PO TABS
10.0000 mg | ORAL_TABLET | Freq: Every evening | ORAL | Status: DC | PRN
  Administered 2011-05-12: 10 mg via ORAL
  Filled 2011-05-11: qty 2

## 2011-05-11 MED ORDER — SODIUM CHLORIDE 0.9 % IJ SOLN
3.0000 mL | INTRAMUSCULAR | Status: DC | PRN
  Administered 2011-05-13: 3 mL via INTRAVENOUS

## 2011-05-11 MED ORDER — NITROGLYCERIN 0.4 MG SL SUBL
0.4000 mg | SUBLINGUAL_TABLET | Freq: Once | SUBLINGUAL | Status: AC
  Administered 2011-05-11: 0.4 mg via SUBLINGUAL

## 2011-05-11 MED ORDER — ONDANSETRON HCL 4 MG/2ML IJ SOLN: 4.0000 mg | Freq: Four times a day (QID) | INTRAMUSCULAR | Status: DC | PRN

## 2011-05-11 MED ORDER — ALPRAZOLAM 0.25 MG PO TABS: 0.2500 mg | ORAL_TABLET | Freq: Three times a day (TID) | ORAL | Status: DC | PRN

## 2011-05-11 MED ORDER — ASPIRIN 81 MG PO CHEW
324.0000 mg | CHEWABLE_TABLET | Freq: Once | ORAL | Status: AC
  Administered 2011-05-11: 243 mg via ORAL
  Filled 2011-05-11: qty 4

## 2011-05-11 MED ORDER — INSULIN ASPART 100 UNIT/ML ~~LOC~~ SOLN
0.0000 [IU] | Freq: Every day | SUBCUTANEOUS | Status: DC
  Filled 2011-05-11 (×4): qty 3

## 2011-05-11 MED ORDER — HEPARIN SOD (PORCINE) IN D5W 100 UNIT/ML IV SOLN
1050.0000 [IU]/h | INTRAVENOUS | Status: DC
  Administered 2011-05-11: 850 [IU]/h via INTRAVENOUS
  Administered 2011-05-12 – 2011-05-13 (×2): 1050 [IU]/h via INTRAVENOUS
  Filled 2011-05-11 (×5): qty 250

## 2011-05-11 MED ORDER — NITROGLYCERIN 2 % TD OINT
0.5000 [in_us] | TOPICAL_OINTMENT | Freq: Four times a day (QID) | TRANSDERMAL | Status: DC
  Administered 2011-05-11 – 2011-05-14 (×10): 1 [in_us] via TOPICAL
  Filled 2011-05-11 (×2): qty 30

## 2011-05-11 MED ORDER — PANTOPRAZOLE SODIUM 40 MG PO TBEC
40.0000 mg | DELAYED_RELEASE_TABLET | Freq: Every day | ORAL | Status: DC
  Administered 2011-05-12 – 2011-05-14 (×3): 40 mg via ORAL
  Filled 2011-05-11 (×2): qty 1

## 2011-05-11 MED ORDER — GLIPIZIDE 10 MG PO TABS
10.0000 mg | ORAL_TABLET | Freq: Two times a day (BID) | ORAL | Status: DC
  Administered 2011-05-12 – 2011-05-14 (×5): 10 mg via ORAL
  Filled 2011-05-11 (×8): qty 1

## 2011-05-11 MED ORDER — METOPROLOL TARTRATE 1 MG/ML IV SOLN
5.0000 mg | Freq: Once | INTRAVENOUS | Status: AC
  Administered 2011-05-11: 5 mg via INTRAVENOUS
  Filled 2011-05-11: qty 5

## 2011-05-11 MED ORDER — ACETAMINOPHEN 325 MG PO TABS
650.0000 mg | ORAL_TABLET | ORAL | Status: DC | PRN
  Administered 2011-05-12 – 2011-05-13 (×2): 650 mg via ORAL
  Filled 2011-05-11 (×4): qty 2

## 2011-05-11 MED ORDER — ROSUVASTATIN CALCIUM 20 MG PO TABS
20.0000 mg | ORAL_TABLET | Freq: Every day | ORAL | Status: DC
  Administered 2011-05-11 – 2011-05-14 (×4): 20 mg via ORAL
  Filled 2011-05-11 (×5): qty 1

## 2011-05-11 MED ORDER — HEPARIN BOLUS VIA INFUSION
3500.0000 [IU] | Freq: Once | INTRAVENOUS | Status: AC
  Administered 2011-05-11: 3500 [IU] via INTRAVENOUS
  Filled 2011-05-11: qty 3500

## 2011-05-11 NOTE — H&P (Signed)
Patient ID: JAHAN FRIEDLANDER MRN: [id number], DOB/AGE: 1940/06/23   Admit date: 05/11/2011   Primary Physician: Milana Obey, MD Primary Cardiologist: Dr Rennis Golden  HPI: Mr. Haroon is a 70 year old male who has been seen by [place] in the past. He had a catheterization in 2007 for chest pain that showed a 60-70% branch of the diagonal. This was treated medically. In 2009 he had a small non-ST elevation MI with a troponin of 1.13. He was restudied at that time and the branch off the diagonal had now occluded. Otherwise he had 40% mid LAD but no other significant coronary disease and good LV function. He has not seen [place] back since. He is followed by Dr. Katharine Look. He is admitted now as a transfer from [facility]. The patient presented to the emergency room there after complaining of 2 weeks of gradually increasing indigestion and chest tightness and palpitations. He's had frequent PVCs on telemetry. His EKG shows no other acute changes. He denies any radiation to his jaw or arms. He denies any associated nausea vomiting diaphoresis. He did not use nitroglycerin.   Problem List: Past Medical History  Diagnosis Date  . Hypertension   . Diabetes mellitus   . PUD (peptic ulcer disease)     endo 03/15/10  . 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 Dyslipidemia     Past Surgical History  Procedure Date  . Hernia repair   . Back surgery      Allergies:  Allergies  Allergen Reactions  . Sulfa Antibiotics   . Vioxx (Rofecoxib)      Home Medications Prescriptions prior to admission  Medication Sig Dispense Refill  . aspirin EC 81 MG tablet Take 81 mg by mouth daily.        Marland Kitchen atorvastatin (LIPITOR) 40 MG tablet Take 40 mg by mouth daily.        Marland Kitchen glipiZIDE (GLUCOTROL) 10 MG tablet Take 10 mg by mouth 2 (two) times daily before a meal.        . lisinopril (PRINIVIL,ZESTRIL) 20 MG tablet Take 10 mg by mouth daily.        . metFORMIN (GLUCOPHAGE) 500 MG tablet  Take 1,000 mg by mouth 2 (two) times daily with a meal.        . metoprolol tartrate (LOPRESSOR) 25 MG tablet Take 12.5 mg by mouth 2 (two) times daily.        . Multiple Vitamins-Minerals (CENTRUM SILVER ULTRA MENS PO) Take 1 tablet by mouth daily.        . pantoprazole (PROTONIX) 40 MG tablet Take 40 mg by mouth daily.        . sodium chloride (OCEAN) 0.65 % nasal spray Place 1 spray into the nose as needed. sinus          Family History  Problem Relation Age of Onset  . Coronary artery disease Father     died 22  . Kidney failure Mother     died 50  . Sarcoidosis Daughter     died 30     History   Social History  . Marital Status: Married    Spouse Name: N/A    Number of Children: 4  . Years of Education: N/A   Occupational History  . retired    Social History Main Topics  . Smoking status: Former Games developer  . Smokeless tobacco: Not on file  . Alcohol Use: Yes     seldom  .  Drug Use: No  . Sexually Active:    Other Topics Concern  . Not on file   Social History Narrative  . No narrative on file     Review of Systems: General: negative for chills, fever, night sweats or weight changes.  Cardiovascular: negative for chest pain, dyspnea on exertion, edema, orthopnea, palpitations, paroxysmal nocturnal dyspnea or shortness of breath Dermatological: negative for rash Respiratory: negative for cough or wheezing Urologic: negative for hematuria Abdominal: negative for nausea, vomiting, diarrhea, bright red blood per rectum, melena, or hematemesis Neurologic: negative for visual changes, syncope, or dizziness All other systems reviewed and are otherwise negative except as noted above.  Physical Exam: Blood pressure 138/80, pulse 63, temperature 98.7 F (37.1 C), temperature source Oral, resp. rate 20, height 5\' 8"  (1.727 m), weight 69.4 kg (153 lb), SpO2 99.00%.  General appearance: alert, cooperative, appears stated age and no distress Neck: no adenopathy, no  carotid bruit, no JVD, supple, symmetrical, trachea midline and thyroid not enlarged, symmetric, no tenderness/mass/nodules Lungs: clear to auscultation bilaterally Heart: regular rate and rhythm, S1, S2 normal, no murmur, click, rub or gallop Abdomen: soft, non-tender; bowel sounds normal; no masses,  no organomegaly Extremities: extremities normal, atraumatic, no cyanosis or edema Pulses: 2+ and symmetric Skin: Skin color, texture, turgor normal. No rashes or lesions Neurologic: Grossly normal    Labs:   Results for orders placed during the hospital encounter of 05/11/11 (from the past 24 hour(s))  CBC     Status: Abnormal   Collection Time   05/11/11 11:38 AM      Component Value Range   WBC 5.2  4.0 - 10.5 (K/uL)   RBC 4.29  4.22 - 5.81 (MIL/uL)   Hemoglobin 12.2 (*) 13.0 - 17.0 (g/dL)   HCT 16.1 (*) 09.6 - 52.0 (%)   MCV 83.9  78.0 - 100.0 (fL)   MCH 28.4  26.0 - 34.0 (pg)   MCHC 33.9  30.0 - 36.0 (g/dL)   RDW 04.5  40.9 - 81.1 (%)   Platelets 293  150 - 400 (K/uL)  BASIC METABOLIC PANEL     Status: Abnormal   Collection Time   05/11/11 11:38 AM      Component Value Range   Sodium 136  135 - 145 (mEq/L)   Potassium 4.0  3.5 - 5.1 (mEq/L)   Chloride 98  96 - 112 (mEq/L)   CO2 28  19 - 32 (mEq/L)   Glucose, Bld 163 (*) 70 - 99 (mg/dL)   BUN 15  6 - 23 (mg/dL)   Creatinine, Ser 9.14  0.50 - 1.35 (mg/dL)   Calcium 78.2  8.4 - 10.5 (mg/dL)   GFR calc non Af Amer 86 (*) >90 (mL/min)   GFR calc Af Amer >90  >90 (mL/min)  POCT I-STAT TROPONIN I     Status: Normal   Collection Time   05/11/11 11:45 AM      Component Value Range   Troponin i, poc 0.00  0.00 - 0.08 (ng/mL)   Comment 3              Radiology/Studies: Chest Portable 1 View  05/11/2011  *RADIOLOGY REPORT*  Clinical Data: Chest pain and shortness of breath.  PORTABLE CHEST - 1 VIEW  Comparison: Plain film chest 03/19/2010 and 04/26/2008.  Findings: Lungs are clear.  Heart size is normal.  No pneumothorax or  effusion.  IMPRESSION: No acute disease.  Original Report Authenticated By: Bernadene Bell. Maricela Curet, M.D.  EKG:NSR with frequent PVC's\   ASSESSMENT AND PLAN:  Principal Problem:  *Acute coronary syndrome Active Problems:  CAD (coronary artery disease), small Dx branch occl 2009  Diabetes mellitus  HTN (hypertension)  Dyslipidemia  Family history of early CAD  Pan- Admit, r/o MI, start heparin, nitrates, increase beta blocker.  Deland Pretty, PA-C 05/11/2011, 8:02 PM

## 2011-05-11 NOTE — Progress Notes (Signed)
ANTICOAGULATION CONSULT NOTE - Initial Consult  Pharmacy Consult:  Heparin Indication: NSTEMI  Allergies  Allergen Reactions  . Sulfa Antibiotics   . Vioxx (Rofecoxib)     Patient Measurements: Height: 5\' 8"  (172.7 cm) Weight: 153 lb (69.4 kg) IBW/kg (Calculated) : 68.4  Heparin weight = 69kg  Vital Signs: Temp: 98.7 F (37.1 C) (12/07 1702) Temp src: Oral (12/07 1702) BP: 138/80 mmHg (12/07 1702) Pulse Rate: 63  (12/07 1702)  Labs:  Basename 05/11/11 1138  HGB 12.2*  HCT 36.0*  PLT 293  APTT --  LABPROT --  INR --  HEPARINUNFRC --  CREATININE 0.85  CKTOTAL --  CKMB --  TROPONINI --   Estimated Creatinine Clearance: 78.2 ml/min (by C-G formula based on Cr of 0.85).  Medical History: Past Medical History  Diagnosis Date  . Hypertension   . Diabetes mellitus   . PUD (peptic ulcer disease)     endo 03/15/10  . 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 Dyslipidemia      Assessment: 70 YOM with NSTEMI to start anticoagulation with heparin.  Baseline labs ok.  Goal of Therapy:  HL 0.3 - 0.7   Plan:  1.  Heparin 3500 units IV bolus, then gtt at 850 units/hr 2.  Heparin level 8 hrs post gtt started 3.  Daily HL and CBC   Phillips Climes Dien 05/11/2011,8:17 PM

## 2011-05-11 NOTE — ED Notes (Signed)
Called [phone] at [facility], gave report to [place] RN

## 2011-05-11 NOTE — ED Notes (Signed)
Pt c/o chest pain intermittently x 3 weeks; pt states he has had some sob and dizziness

## 2011-05-11 NOTE — ED Provider Notes (Addendum)
History    Scribed for Angel Hutching, MD, the patient was seen in room APA14/APA14. This chart was scribed by Katha Cabal.   CSN: [id number] Arrival date & time: 05/11/2011 11:31 AM   First MD Initiated Contact with Patient 05/11/11 1157      Chief Complaint  Patient presents with  . Chest Pain    (Consider location/radiation/quality/duration/timing/severity/associated sxs/prior treatment) Patient is a 70 y.o. male presenting with chest pain. The history is provided by the patient. No language interpreter was used.  Chest Pain Episode onset: 3 weeks ago  Duration of episode(s) is 5 minutes. Chest pain occurs intermittently. The chest pain is unchanged. The pain is associated with exertion. The quality of the pain is described as pressure-like and crushing. Primary symptoms include shortness of breath and palpitations. Pertinent negatives for primary symptoms include no nausea.  The palpitations also occurred with shortness of breath.  Pertinent negatives for associated symptoms include no diaphoresis. Risk factors include male gender and being elderly.  His past medical history is significant for diabetes, hyperlipidemia and hypertension.  Pertinent negatives for past medical history include no MI.  Pertinent negatives for family medical history include: no early MI in family.   Patient reports similar pain with indigestion.  Patient saw PCP how changed his GERD medication.  Patient had episode in ED that lasted about 3 minutes.  Patient takes baby ASA.    PCP Milana Obey, MD  Past Medical History  Diagnosis Date  . Hypertension   . Diabetes mellitus     Past Surgical History  Procedure Date  . Hernia repair   . Back surgery     History reviewed. No pertinent family history.  History  Substance Use Topics  . Smoking status: Former Games developer  . Smokeless tobacco: Not on file  . Alcohol Use: Yes     seldom      Review of Systems  Constitutional: Negative for  diaphoresis.  Respiratory: Positive for chest tightness and shortness of breath.   Cardiovascular: Positive for chest pain and palpitations.  Gastrointestinal: Negative for nausea.  All other systems reviewed and are negative.    Allergies  Sulfa antibiotics and Vioxx  Home Medications   Current Outpatient Rx  Name Route Sig Dispense Refill  . ASPIRIN EC 81 MG PO TBEC Oral Take 81 mg by mouth daily.      . ATORVASTATIN CALCIUM 40 MG PO TABS Oral Take 40 mg by mouth daily.      Marland Kitchen GLIPIZIDE 10 MG PO TABS Oral Take 10 mg by mouth 2 (two) times daily before a meal.      . LISINOPRIL 20 MG PO TABS Oral Take 10 mg by mouth daily.      Marland Kitchen METFORMIN HCL 500 MG PO TABS Oral Take 1,000 mg by mouth 2 (two) times daily with a meal.      . METOPROLOL TARTRATE 25 MG PO TABS Oral Take 12.5 mg by mouth 2 (two) times daily.      . CENTRUM SILVER ULTRA MENS PO Oral Take 1 tablet by mouth daily.      Marland Kitchen PANTOPRAZOLE SODIUM 40 MG PO TBEC Oral Take 40 mg by mouth daily.      Marland Kitchen SALINE NASAL SPRAY 0.65 % NA SOLN Nasal Place 1 spray into the nose as needed. sinus       BP 123/81  Pulse 77  Temp(Src) 98.5 F (36.9 C) (Oral)  Resp 20  Ht 5\' 8"  (1.727 m)  Wt  160 lb (72.576 kg)  BMI 24.33 kg/m2  SpO2 97%  Physical Exam  Constitutional: He is oriented to person, place, and time. He appears well-developed and well-nourished.  Non-toxic appearance. He does not have a sickly appearance.  HENT:  Head: Normocephalic and atraumatic.  Eyes: Conjunctivae, EOM and lids are normal. Pupils are equal, round, and reactive to light.  Neck: Trachea normal, normal range of motion and full passive range of motion without pain. Neck supple.  Cardiovascular: Normal rate, regular rhythm and normal heart sounds.   No murmur heard. Pulmonary/Chest: Effort normal and breath sounds normal. No respiratory distress.  Abdominal: Soft. Normal appearance. He exhibits no distension. There is no tenderness. There is no rebound.    Musculoskeletal: Normal range of motion. He exhibits no edema.  Neurological: He is alert and oriented to person, place, and time. He has normal strength.  Skin: Skin is warm, dry and intact. No rash noted.  Psychiatric: He has a normal mood and affect. His behavior is normal.    ED Course  Procedures (including critical care time)   DIAGNOSTIC STUDIES: Oxygen Saturation is 98% on nasal cannula, normal by my interpretation.     COORDINATION OF CARE: 12:22 PM  Physical exam complete.  Will order cardiac markers.  2:34 PM  Plan to transfer patient.  Patient agrees with plan.    MEDICATIONS GIVEN IN ED:  Scheduled Meds:    . aspirin  324 mg Oral Once  . nitroGLYCERIN  0.4 mg Sublingual Once   Continuous Infusions:  PRN Meds:.   LABS / RADIOLOGY:   Labs Reviewed  CBC - Abnormal; Notable for the following:    Hemoglobin 12.2 (*)    HCT 36.0 (*)    All other components within normal limits  BASIC METABOLIC PANEL - Abnormal; Notable for the following:    Glucose, Bld 163 (*)    GFR calc non Af Amer 86 (*)    All other components within normal limits  POCT I-STAT TROPONIN I  I-STAT TROPONIN I   Results for orders placed during the hospital encounter of 05/11/11  CBC      Component Value Range   WBC 5.2  4.0 - 10.5 (K/uL)   RBC 4.29  4.22 - 5.81 (MIL/uL)   Hemoglobin 12.2 (*) 13.0 - 17.0 (g/dL)   HCT 16.1 (*) 09.6 - 52.0 (%)   MCV 83.9  78.0 - 100.0 (fL)   MCH 28.4  26.0 - 34.0 (pg)   MCHC 33.9  30.0 - 36.0 (g/dL)   RDW 04.5  40.9 - 81.1 (%)   Platelets 293  150 - 400 (K/uL)  BASIC METABOLIC PANEL      Component Value Range   Sodium 136  135 - 145 (mEq/L)   Potassium 4.0  3.5 - 5.1 (mEq/L)   Chloride 98  96 - 112 (mEq/L)   CO2 28  19 - 32 (mEq/L)   Glucose, Bld 163 (*) 70 - 99 (mg/dL)   BUN 15  6 - 23 (mg/dL)   Creatinine, Ser 9.14  0.50 - 1.35 (mg/dL)   Calcium 78.2  8.4 - 10.5 (mg/dL)   GFR calc non Af Amer 86 (*) >90 (mL/min)   GFR calc Af Amer >90  >90  (mL/min)  POCT I-STAT TROPONIN I      Component Value Range   Troponin i, poc 0.00  0.00 - 0.08 (ng/mL)   Comment 3  Chest Portable 1 View  05/11/2011  *RADIOLOGY REPORT*  Clinical Data: Chest pain and shortness of breath.  PORTABLE CHEST - 1 VIEW  Comparison: Plain film chest 03/19/2010 and 04/26/2008.  Findings: Lungs are clear.  Heart size is normal.  No pneumothorax or effusion.  IMPRESSION: No acute disease.  Original Report Authenticated By: Bernadene Bell. Maricela Curet, M.D.    Date: 05/11/2011  Rate: 96  Rhythm: normal sinus rhythm  QRS Axis: normal  Intervals: normal  ST/T Wave abnormalities: normal  Conduction Disutrbances:none  Narrative Interpretation:   Old EKG Reviewed: changes noted PAC, PVC   CRITICAL CARE Performed by: Angel Dixon   Total critical care time: 30  Critical care time was exclusive of separately billable procedures and treating other patients.  Critical care was necessary to treat or prevent imminent or life-threatening deterioration.  Critical care was time spent personally by me on the following activities: development of treatment plan with patient and/or surrogate as well as nursing, discussions with consultants, evaluation of patient's response to treatment, examination of patient, obtaining history from patient or surrogate, ordering and performing treatments and interventions, ordering and review of laboratory studies, ordering and review of radiographic studies, pulse oximetry and re-evaluation of patient's condition. IMPRESSION: 1. Chest pain       MDM  Patient has several risk factors and good history for anginal pain.  Rx nitroglycerin and aspirin in ED. Also IV beta blocker. Dust with primary care Dr. and cardiologist. Will transfer to [facility]      I personally performed the services described in this documentation, which was scribed in my presence. The recorded information has been reviewed and considered.   Angel Hutching,  MD 05/11/11 1437  Angel Hutching, MD 05/11/11 [phone]

## 2011-05-12 LAB — CARDIAC PANEL(CRET KIN+CKTOT+MB+TROPI)
CK, MB: 2.2 ng/mL (ref 0.3–4.0)
CK, MB: 1.9 ng/mL (ref 0.3–4.0)
Relative Index: 2 (ref 0.0–2.5)
Relative Index: 1.7 (ref 0.0–2.5)
Total CK: 112 U/L (ref 7–232)
Total CK: 115 U/L (ref 7–232)
Troponin I: <0.3 ng/mL (ref <0.30)
Troponin I: <0.3 ng/mL (ref <0.30)

## 2011-05-12 LAB — TSH: TSH: 3.045 u[IU]/mL (ref 0.350–4.500)

## 2011-05-12 LAB — HEMOGLOBIN A1C
Hgb A1c MFr Bld: 7.4 % — ABNORMAL HIGH (ref <5.7)
Mean Plasma Glucose: 166 mg/dL — ABNORMAL HIGH (ref <117)

## 2011-05-12 LAB — CBC
MCH: 27.7 pg (ref 26.0–34.0)
MCHC: 33.2 g/dL (ref 30.0–36.0)
Platelets: 317 10*3/uL (ref 150–400)

## 2011-05-12 MED ORDER — MAGNESIUM HYDROXIDE 400 MG/5ML PO SUSP
30.0000 mL | Freq: Every day | ORAL | Status: DC | PRN
  Administered 2011-05-12: 30 mL via ORAL
  Filled 2011-05-12 (×2): qty 30

## 2011-05-12 MED ORDER — ONDANSETRON HCL 4 MG/2ML IJ SOLN: 4.0000 mg | Freq: Four times a day (QID) | INTRAMUSCULAR | Status: DC | PRN

## 2011-05-12 MED ORDER — METOPROLOL TARTRATE 50 MG PO TABS
50.0000 mg | ORAL_TABLET | Freq: Two times a day (BID) | ORAL | Status: DC
  Administered 2011-05-12 – 2011-05-14 (×5): 50 mg via ORAL
  Filled 2011-05-12 (×6): qty 1

## 2011-05-12 MED ORDER — ACETAMINOPHEN 325 MG PO TABS: 650.0000 mg | ORAL_TABLET | ORAL | Status: DC | PRN

## 2011-05-12 NOTE — Progress Notes (Signed)
[facility]  DAILY PROGRESS NOTE   Subjective:  Denies chest pain. Still having fluttering of his heart, infrequent.  Objective:  Temp:  [97.5 F (36.4 C)-98.7 F (37.1 C)] 97.9 F (36.6 C) (12/08 8295) Pulse Rate:  [52-100] 82  (12/08 0637) Resp:  [14-20] 20  (12/08 0637) BP: (113-159)/(71-93) 132/77 mmHg (12/08 0637) SpO2:  [96 %-99 %] 97 % (12/08 0637) Weight:  [68.6 kg (151 lb 3.8 oz)-72.576 kg (160 lb)] 151 lb 3.8 oz (68.6 kg) (12/08 6213) Weight change:   Intake/Output from previous day: 12/07 0701 - 12/08 0700 In: 663 [P.O.:663] Out: 500 [Urine:500]  Intake/Output from this shift: Total I/O In: 240 [P.O.:240] Out: -   Medications: Current Facility-Administered Medications  Medication Dose Route Frequency Provider Last Rate Last Dose  . acetaminophen (TYLENOL) tablet 650 mg  650 mg Oral Q4H PRN Abelino Derrick, PA      . ALPRAZolam Prudy Feeler) tablet 0.25 mg  0.25 mg Oral Q8H PRN Abelino Derrick, [place]      . aspirin chewable tablet 324 mg  324 mg Oral Once Donnetta Hutching, MD   243 mg at 05/11/11 1247  . aspirin EC tablet 81 mg  81 mg Oral Daily Eda Paschal [place], [place]      . glipiZIDE (GLUCOTROL) tablet 10 mg  10 mg Oral BID AC Eda Paschal [place], [place]   10 mg at 05/12/11 0650  . heparin ADULT infusion 100 units/ml (25000 units/250 ml)  850 Units/hr Intravenous Continuous Lennon Alstrom, PHARMD 8.5 mL/hr at 05/11/11 2120 850 Units/hr at 05/11/11 2120  . heparin bolus via infusion 3,500 Units  3,500 Units Intravenous Once [facility], [place]   3,500 Units at 05/11/11 2120  . insulin aspart (novoLOG) injection 0-5 Units  0-5 Units Subcutaneous QHS Eda Paschal [place], [place]      . insulin aspart (novoLOG) injection 0-9 Units  0-9 Units Subcutaneous TID WC Abelino Derrick, PA   2 Units at 05/12/11 [phone]  . lisinopril (PRINIVIL,ZESTRIL) tablet 10 mg  10 mg Oral Daily Eda Paschal [place], [place]      . metoprolol (LOPRESSOR) injection 5 mg  5 mg Intravenous Once Donnetta Hutching, MD   5 mg at  05/11/11 1547  . metoprolol tartrate (LOPRESSOR) tablet 25 mg  25 mg Oral BID Eda Paschal [place], [place]   25 mg at 05/11/11 2246  . nitroGLYCERIN (NITROGLYN) 2 % ointment 1 inch  1 inch Topical Q6H Abelino Derrick, [place]   1 inch at 05/12/11 0650  . nitroGLYCERIN (NITROSTAT) SL tablet 0.4 mg  0.4 mg Sublingual Once Donnetta Hutching, MD   0.4 mg at 05/11/11 1253  . nitroGLYCERIN (NITROSTAT) SL tablet 0.4 mg  0.4 mg Sublingual Q5 min PRN Abelino Derrick, PA      . ondansetron [facility]) injection 4 mg  4 mg Intravenous Q6H PRN Abelino Derrick, PA      . pantoprazole (PROTONIX) EC tablet 40 mg  40 mg Oral Daily Eda Paschal [place], [place]      . rosuvastatin (CRESTOR) tablet 20 mg  20 mg Oral Daily Eda Paschal [place], [place]   20 mg at 05/11/11 2257  . sodium chloride (OCEAN) 0.65 % nasal spray 1 spray  1 spray Nasal PRN Abelino Derrick, PA      . sodium chloride 0.9 % injection 3 mL  3 mL Intravenous PRN Abelino Derrick, PA      . zolpidem (AMBIEN) tablet 10  mg  10 mg Oral QHS PRN Abelino Derrick, PA        Physical Exam: General appearance: alert and no distress Neck: no adenopathy, no carotid bruit, no JVD, supple, symmetrical, trachea midline and thyroid not enlarged, symmetric, no tenderness/mass/nodules Lungs: clear to auscultation bilaterally Heart: regular rate and rhythm, S1, S2 normal, no murmur, click, rub or gallop Abdomen: soft, non-tender; bowel sounds normal; no masses,  no organomegaly Extremities: extremities normal, atraumatic, no cyanosis or edema Skin: Skin color, texture, turgor normal. No rashes or lesions  Lab Results: Results for orders placed during the hospital encounter of 05/11/11 (from the past 48 hour(s))  CBC     Status: Abnormal   Collection Time   05/11/11 11:38 AM      Component Value Range Comment   WBC 5.2  4.0 - 10.5 (K/uL)    RBC 4.29  4.22 - 5.81 (MIL/uL)    Hemoglobin 12.2 (*) 13.0 - 17.0 (g/dL)    HCT 40.9 (*) 81.1 - 52.0 (%)    MCV 83.9  78.0 - 100.0 (fL)    MCH 28.4  26.0 - 34.0 (pg)    MCHC  33.9  30.0 - 36.0 (g/dL)    RDW 91.4  78.2 - 95.6 (%)    Platelets 293  150 - 400 (K/uL)   BASIC METABOLIC PANEL     Status: Abnormal   Collection Time   05/11/11 11:38 AM      Component Value Range Comment   Sodium 136  135 - 145 (mEq/L)    Potassium 4.0  3.5 - 5.1 (mEq/L)    Chloride 98  96 - 112 (mEq/L)    CO2 28  19 - 32 (mEq/L)    Glucose, Bld 163 (*) 70 - 99 (mg/dL)    BUN 15  6 - 23 (mg/dL)    Creatinine, Ser 2.13  0.50 - 1.35 (mg/dL)    Calcium 08.6  8.4 - 10.5 (mg/dL)    GFR calc non Af Amer 86 (*) >90 (mL/min)    GFR calc Af Amer >90  >90 (mL/min)   POCT I-STAT TROPONIN I     Status: Normal   Collection Time   05/11/11 11:45 AM      Component Value Range Comment   Troponin i, poc 0.00  0.00 - 0.08 (ng/mL)    Comment 3            CARDIAC PANEL(CRET KIN+CKTOT+MB+TROPI)     Status: Normal   Collection Time   05/11/11  7:45 PM      Component Value Range Comment   Total CK 130  7 - 232 (U/L)    CK, MB 2.3  0.3 - 4.0 (ng/mL)    Troponin I <0.30  <0.30 (ng/mL)    Relative Index 1.8  0.0 - 2.5    PROTIME-INR     Status: Normal   Collection Time   05/11/11  7:46 PM      Component Value Range Comment   Prothrombin Time 13.7  11.6 - 15.2 (seconds)    INR 1.03  0.00 - 1.49    CBC     Status: Abnormal   Collection Time   05/11/11  7:46 PM      Component Value Range Comment   WBC 6.6  4.0 - 10.5 (K/uL)    RBC 4.29  4.22 - 5.81 (MIL/uL)    Hemoglobin 11.8 (*) 13.0 - 17.0 (g/dL)    HCT 57.8 (*) 46.9 - 52.0 (%)  MCV 84.1  78.0 - 100.0 (fL)    MCH 27.5  26.0 - 34.0 (pg)    MCHC 32.7  30.0 - 36.0 (g/dL)    RDW 16.1  09.6 - 04.5 (%)    Platelets 321  150 - 400 (K/uL)   DIFFERENTIAL     Status: Abnormal   Collection Time   05/11/11  7:46 PM      Component Value Range Comment   Neutrophils Relative 51  43 - 77 (%)    Neutro Abs 3.3  1.7 - 7.7 (K/uL)    Lymphocytes Relative 30  12 - 46 (%)    Lymphs Abs 2.0  0.7 - 4.0 (K/uL)    Monocytes Relative 11  3 - 12 (%)    Monocytes  Absolute 0.7  0.1 - 1.0 (K/uL)    Eosinophils Relative 7 (*) 0 - 5 (%)    Eosinophils Absolute 0.5  0.0 - 0.7 (K/uL)    Basophils Relative 1  0 - 1 (%)    Basophils Absolute 0.1  0.0 - 0.1 (K/uL)   TSH     Status: Normal   Collection Time   05/11/11  7:46 PM      Component Value Range Comment   TSH 3.045  0.350 - 4.500 (uIU/mL)   APTT     Status: Normal   Collection Time   05/11/11  7:46 PM      Component Value Range Comment   aPTT 36  24 - 37 (seconds)   COMPREHENSIVE METABOLIC PANEL     Status: Abnormal   Collection Time   05/11/11  7:46 PM      Component Value Range Comment   Sodium 140  135 - 145 (mEq/L)    Potassium 4.1  3.5 - 5.1 (mEq/L)    Chloride 101  96 - 112 (mEq/L)    CO2 27  19 - 32 (mEq/L)    Glucose, Bld 76  70 - 99 (mg/dL)    BUN 15  6 - 23 (mg/dL)    Creatinine, Ser 4.09  0.50 - 1.35 (mg/dL)    Calcium 9.6  8.4 - 10.5 (mg/dL)    Total Protein 7.2  6.0 - 8.3 (g/dL)    Albumin 3.8  3.5 - 5.2 (g/dL)    AST 24  0 - 37 (U/L)    ALT 29  0 - 53 (U/L)    Alkaline Phosphatase 100  39 - 117 (U/L)    Total Bilirubin 0.4  0.3 - 1.2 (mg/dL)    GFR calc non Af Amer 87 (*) >90 (mL/min)    GFR calc Af Amer >90  >90 (mL/min)   MAGNESIUM     Status: Normal   Collection Time   05/11/11  7:46 PM      Component Value Range Comment   Magnesium 2.0  1.5 - 2.5 (mg/dL)   HEMOGLOBIN W1X     Status: Abnormal   Collection Time   05/11/11  7:46 PM      Component Value Range Comment   Hemoglobin A1C 7.4 (*) <5.7 (%)    Mean Plasma Glucose 166 (*) <117 (mg/dL)   GLUCOSE, CAPILLARY     Status: Abnormal   Collection Time   05/11/11 10:42 PM      Component Value Range Comment   Glucose-Capillary 123 (*) 70 - 99 (mg/dL)   CARDIAC PANEL(CRET KIN+CKTOT+MB+TROPI)     Status: Normal   Collection Time   05/12/11 12:34 AM  Component Value Range Comment   Total CK 112  7 - 232 (U/L)    CK, MB 2.2  0.3 - 4.0 (ng/mL)    Troponin I <0.30  <0.30 (ng/mL)    Relative Index 2.0  0.0 - 2.5      CARDIAC PANEL(CRET KIN+CKTOT+MB+TROPI)     Status: Normal   Collection Time   05/12/11  6:00 AM      Component Value Range Comment   Total CK 115  7 - 232 (U/L)    CK, MB 1.9  0.3 - 4.0 (ng/mL)    Troponin I <0.30  <0.30 (ng/mL)    Relative Index 1.7  0.0 - 2.5    HEPARIN LEVEL (UNFRACTIONATED)     Status: Normal   Collection Time   05/12/11  6:00 AM      Component Value Range Comment   Heparin Unfractionated 0.36  0.30 - 0.70 (IU/mL)   CBC     Status: Abnormal   Collection Time   05/12/11  6:00 AM      Component Value Range Comment   WBC 5.9  4.0 - 10.5 (K/uL)    RBC 4.44  4.22 - 5.81 (MIL/uL)    Hemoglobin 12.3 (*) 13.0 - 17.0 (g/dL)    HCT 04.5 (*) 40.9 - 52.0 (%)    MCV 83.3  78.0 - 100.0 (fL)    MCH 27.7  26.0 - 34.0 (pg)    MCHC 33.2  30.0 - 36.0 (g/dL)    RDW 81.1  91.4 - 78.2 (%)    Platelets 317  150 - 400 (K/uL)   GLUCOSE, CAPILLARY     Status: Abnormal   Collection Time   05/12/11  6:43 AM      Component Value Range Comment   Glucose-Capillary 170 (*) 70 - 99 (mg/dL)    Comment 1 Notify RN       Imaging: Chest Portable 1 View  05/11/2011  *RADIOLOGY REPORT*  Clinical Data: Chest pain and shortness of breath.  PORTABLE CHEST - 1 VIEW  Comparison: Plain film chest 03/19/2010 and 04/26/2008.  Findings: Lungs are clear.  Heart size is normal.  No pneumothorax or effusion.  IMPRESSION: No acute disease.  Original Report Authenticated By: Bernadene Bell. Maricela Curet, M.D.    Assessment:  1. Principal Problem: 2.  *Acute coronary syndrome 3. Active Problems: 4.  CAD (coronary artery disease), small Dx branch occl 2009 5.  Diabetes mellitus 6.  HTN (hypertension) 7.  Dyslipidemia 8.  Family history of early CAD 69. PVC's  Plan:  1. Increase B-blocker given PVC's. Continue heparin. 2. Plan LHC on Monday.  Time Spent Directly with Patient:  15 minutes  Length of Stay:  LOS: 1 day   Chrystie Nose, MD Attending Cardiologist [facility]  Carnie Bruemmer C 05/12/2011, 9:35 AM

## 2011-05-12 NOTE — H&P (Signed)
Pt. Seen and examined. Agree with the NP/PA-C note as written.  Known CAD .Marland Kitchen Recurrent chest pain. Cardiac enzymes negative thus far. Frequent PVC's on telemetry. Chest pain has resolved. Will likely need repeat LHC on Monday.  Chrystie Nose, MD Attending Cardiologist [facility]

## 2011-05-12 NOTE — Consult Note (Signed)
ANTICOAGULATION CONSULT NOTE - Follow Up Consult  Pharmacy Consult for Heparin Indication: NSTEMI  Allergies  Allergen Reactions  . Sulfa Antibiotics   . Vioxx (Rofecoxib)     Patient Measurements: Height: 5\' 8"  (172.7 cm) Weight: 151 lb 3.8 oz (68.6 kg) (scale (B0) IBW/kg (Calculated) : 68.4    Vital Signs: Temp: 98.3 F (36.8 C) (12/08 1435) Temp src: Oral (12/08 1435) BP: 107/64 mmHg (12/08 1435) Pulse Rate: 72  (12/08 1435)  Labs:  Basename 05/12/11 1133 05/12/11 0600 05/12/11 0034 05/11/11 1946 05/11/11 1945 05/11/11 1138  HGB -- 12.3* -- 11.8* -- --  HCT -- 37.0* -- 36.1* -- 36.0*  PLT -- 317 -- 321 -- 293  APTT -- -- -- 36 -- --  LABPROT -- -- -- 13.7 -- --  INR -- -- -- 1.03 -- --  HEPARINUNFRC 0.28* 0.36 -- -- -- --  CREATININE -- -- -- 0.84 -- 0.85  CKTOTAL -- 115 112 -- 130 --  CKMB -- 1.9 2.2 -- 2.3 --  TROPONINI -- <0.30 <0.30 -- <0.30 --   Estimated Creatinine Clearance: 79.2 ml/min (by C-G formula based on Cr of 0.84).   Medications:  Scheduled:    . aspirin EC  81 mg Oral Daily  . glipiZIDE  10 mg Oral BID AC  . heparin  3,500 Units Intravenous Once  . insulin aspart  0-5 Units Subcutaneous QHS  . insulin aspart  0-9 Units Subcutaneous TID WC  . lisinopril  10 mg Oral Daily  . metoprolol  5 mg Intravenous Once  . metoprolol tartrate  50 mg Oral BID  . nitroGLYCERIN  1 inch Topical Q6H  . pantoprazole  40 mg Oral Daily  . rosuvastatin  20 mg Oral Daily  . DISCONTD: metoprolol tartrate  25 mg Oral BID   Infusions:    . heparin 850 Units/hr (05/11/11 2120)    Assessment: Patient is a 70 y/o male admitted for NSTEMI.  He has been on a Heparin infusion. Heparin level 0.28 units/ml  (Goal 0.3-0.7)  Goal of Therapy:  Heparin level 0.3-0.7 units/ml   Plan: Increase Heparin  infusion rate 1050 units/hr  Glorianne Proctor J, Pharm.D. 05/12/2011 2:47 PM

## 2011-05-12 NOTE — Progress Notes (Signed)
ANTICOAGULATION CONSULT NOTE - Follow Up Consult  Pharmacy Consult for heparin Indication: NSTEMI  Allergies  Allergen Reactions  . Sulfa Antibiotics   . Vioxx (Rofecoxib)     Patient Measurements: Height: 5\' 8"  (172.7 cm) Weight: 151 lb 3.8 oz (68.6 kg) (scale (B0) IBW/kg (Calculated) : 68.4   Vital Signs: Temp: 97.9 F (36.6 C) (12/08 0637) BP: 132/77 mmHg (12/08 0637) Pulse Rate: 82  (12/08 0637)  Labs:  Basename 05/12/11 0600 05/12/11 0034 05/11/11 1946 05/11/11 1945 05/11/11 1138  HGB 12.3* -- 11.8* -- --  HCT 37.0* -- 36.1* -- 36.0*  PLT 317 -- 321 -- 293  APTT -- -- 36 -- --  LABPROT -- -- 13.7 -- --  INR -- -- 1.03 -- --  HEPARINUNFRC 0.36 -- -- -- --  CREATININE -- -- 0.84 -- 0.85  CKTOTAL -- 112 -- 130 --  CKMB -- 2.2 -- 2.3 --  TROPONINI -- <0.30 -- <0.30 --   Estimated Creatinine Clearance: 79.2 ml/min (by C-G formula based on Cr of 0.84).   Medications:  Scheduled:    . aspirin  324 mg Oral Once  . aspirin EC  81 mg Oral Daily  . glipiZIDE  10 mg Oral BID AC  . heparin  3,500 Units Intravenous Once  . insulin aspart  0-5 Units Subcutaneous QHS  . insulin aspart  0-9 Units Subcutaneous TID WC  . lisinopril  10 mg Oral Daily  . metoprolol  5 mg Intravenous Once  . metoprolol tartrate  25 mg Oral BID  . nitroGLYCERIN  1 inch Topical Q6H  . nitroGLYCERIN  0.4 mg Sublingual Once  . pantoprazole  40 mg Oral Daily  . rosuvastatin  20 mg Oral Daily   Infusions:    . heparin 850 Units/hr (05/11/11 2120)    Assessment: 70yo male therapeutic on heparin with initial dosing for NSTEMI.  Goal of Therapy:  Heparin level 0.3-0.7 units/ml   Plan:  Will continue gtt at current rate and confirm stable with additional level.  Colleen Can PharmD BCPS 05/12/2011,7:49 AM

## 2011-05-12 NOTE — Progress Notes (Signed)
Placed pt. On CPAP auto titrate (Pt. Unaware of home settings) via nasal mask. Pt. Is tolerating CPAP well at this time.

## 2011-05-13 DIAGNOSIS — I493 Ventricular premature depolarization: Secondary | ICD-10-CM | POA: Diagnosis present

## 2011-05-13 DIAGNOSIS — R002 Palpitations: Secondary | ICD-10-CM | POA: Diagnosis present

## 2011-05-13 LAB — CBC
HCT: 33.1 % — ABNORMAL LOW (ref 39.0–52.0)
Hemoglobin: 11.1 g/dL — ABNORMAL LOW (ref 13.0–17.0)
RDW: 12.7 % (ref 11.5–15.5)
WBC: 5.9 10*3/uL (ref 4.0–10.5)

## 2011-05-13 LAB — GLUCOSE, CAPILLARY
Glucose-Capillary: 182 mg/dL — ABNORMAL HIGH (ref 70–99)
Glucose-Capillary: 187 mg/dL — ABNORMAL HIGH (ref 70–99)

## 2011-05-13 LAB — HEPARIN LEVEL (UNFRACTIONATED): Heparin Unfractionated: 0.53 IU/mL (ref 0.30–0.70)

## 2011-05-13 MED ORDER — SODIUM CHLORIDE 0.9 % IJ SOLN: 3.0000 mL | INTRAMUSCULAR | Status: DC | PRN

## 2011-05-13 MED ORDER — SODIUM CHLORIDE 0.9 % IV SOLN
1.0000 mL/kg/h | INTRAVENOUS | Status: DC
  Administered 2011-05-14: 1 mL/kg/h via INTRAVENOUS

## 2011-05-13 MED ORDER — ASPIRIN 81 MG PO CHEW
324.0000 mg | CHEWABLE_TABLET | ORAL | Status: AC
  Administered 2011-05-14: 324 mg via ORAL
  Filled 2011-05-13: qty 4

## 2011-05-13 MED ORDER — SODIUM CHLORIDE 0.9 % IJ SOLN
3.0000 mL | Freq: Two times a day (BID) | INTRAMUSCULAR | Status: DC
  Administered 2011-05-13: 3 mL via INTRAVENOUS

## 2011-05-13 MED ORDER — SODIUM CHLORIDE 0.9 % IV SOLN
250.0000 mL | INTRAVENOUS | Status: DC | PRN
  Administered 2011-05-13: 250 mL via INTRAVENOUS

## 2011-05-13 NOTE — Progress Notes (Signed)
Subjective:  Still some palpitations, no chest pain  Objective:  Vital Signs in the last 24 hours: Temp:  [97.5 F (36.4 C)-98.3 F (36.8 C)] 97.5 F (36.4 C) (12/09 0548) Pulse Rate:  [65-80] 65  (12/09 0548) Resp:  [16-20] 16  (12/09 0548) BP: (98-139)/(64-82) 98/69 mmHg (12/09 0548) SpO2:  [97 %-98 %] 98 % (12/09 0548) Weight:  [68.9 kg (151 lb 14.4 oz)] 151 lb 14.4 oz (68.9 kg) (12/09 0548)  Intake/Output from previous day:  Intake/Output Summary (Last 24 hours) at 05/13/11 0805 Last data filed at 05/13/11 0600  Gross per 24 hour  Intake 1666.57 ml  Output      0 ml  Net 1666.57 ml    Physical Exam: General appearance: alert, cooperative, appears stated age and no distress Lungs: clear to auscultation bilaterally Heart: regular rate and rhythm, S1, S2 normal, no murmur, click, rub or gallop   Rate: 65  Rhythm: normal sinus rhythm and premature ventricular contractions (PVC)  Lab Results:  Basename 05/13/11 0535 05/12/11 0600  WBC 5.9 5.9  HGB 11.1* 12.3*  PLT 272 317    Basename 05/11/11 1946 05/11/11 1138  NA 140 136  K 4.1 4.0  CL 101 98  CO2 27 28  GLUCOSE 76 163*  BUN 15 15  CREATININE 0.84 0.85    Basename 05/12/11 0600 05/12/11 0034  TROPONINI <0.30 <0.30   Hepatic Function Panel  Basename 05/11/11 1946  PROT 7.2  ALBUMIN 3.8  AST 24  ALT 29  ALKPHOS 100  BILITOT 0.4  BILIDIR --  IBILI --   No results found for this basename: CHOL in the last 72 hours  Basename 05/11/11 1946  INR 1.03    Imaging: Chest Portable 1 View  05/11/2011  *RADIOLOGY REPORT*  Clinical Data: Chest pain and shortness of breath.  PORTABLE CHEST - 1 VIEW  Comparison: Plain film chest 03/19/2010 and 04/26/2008.  Findings: Lungs are clear.  Heart size is normal.  No pneumothorax or effusion.  IMPRESSION: No acute disease.  Original Report Authenticated By: Bernadene Bell. Maricela Curet, M.D.    Cardiac Studies:  Assessment/Plan:   Principal Problem:  *Acute coronary  syndrome Active Problems:  CAD (coronary artery disease), small Dx branch occl 2009  Diabetes mellitus  HTN (hypertension)  Dyslipidemia  Palpitations  Symptomatic PVCs  Family history of early CAD  Plan- cath in am    Corine Shelter PA-C 05/13/2011, 8:05 AM

## 2011-05-13 NOTE — Consult Note (Signed)
ANTICOAGULATION CONSULT NOTE - Follow Up Consult  Pharmacy Consult for Heparin Indication: NSTEMI  Allergies  Allergen Reactions  . Sulfa Antibiotics   . Vioxx (Rofecoxib)    Patient Measurements: Height: 5\' 8"  (172.7 cm) Weight: 151 lb 3.8 oz (68.6 kg) (scale (B0) IBW/kg (Calculated) : 68.4   Vital Signs: Temp: 97.7 F (36.5 C) (12/08 2205) Temp src: Oral (12/08 1435) BP: 108/75 mmHg (12/08 2205) Pulse Rate: 76  (12/08 2205)  Labs:  Basename 05/12/11 2316 05/12/11 1133 05/12/11 0600 05/12/11 0034 05/11/11 1946 05/11/11 1945 05/11/11 1138  HGB -- -- 12.3* -- 11.8* -- --  HCT -- -- 37.0* -- 36.1* -- 36.0*  PLT -- -- 317 -- 321 -- 293  APTT -- -- -- -- 36 -- --  LABPROT -- -- -- -- 13.7 -- --  INR -- -- -- -- 1.03 -- --  HEPARINUNFRC 0.47 0.28* 0.36 -- -- -- --  CREATININE -- -- -- -- 0.84 -- 0.85  CKTOTAL -- -- 115 112 -- 130 --  CKMB -- -- 1.9 2.2 -- 2.3 --  TROPONINI -- -- <0.30 <0.30 -- <0.30 --   Estimated Creatinine Clearance: 79.2 ml/min (by C-G formula based on Cr of 0.84).  Medications:  Scheduled:    . aspirin EC  81 mg Oral Daily  . glipiZIDE  10 mg Oral BID AC  . insulin aspart  0-5 Units Subcutaneous QHS  . insulin aspart  0-9 Units Subcutaneous TID WC  . lisinopril  10 mg Oral Daily  . metoprolol tartrate  50 mg Oral BID  . nitroGLYCERIN  1 inch Topical Q6H  . pantoprazole  40 mg Oral Daily  . rosuvastatin  20 mg Oral Daily  . DISCONTD: metoprolol tartrate  25 mg Oral BID   Infusions:    . heparin 1,050 Units/hr (05/12/11 1528)    Assessment: 70yo male now therapeutic on heparin for NSTEMI.  Goal of Therapy:  Heparin level 0.3-0.7 units/ml   Plan: Will continue heparin gtt at current rate and confirm stable with am labs.   Colleen Can PharmD BCPS 05/13/2011 12:53 AM

## 2011-05-13 NOTE — Progress Notes (Signed)
ANTICOAGULATION CONSULT NOTE - Follow Up Consult  Pharmacy Consult for Heparin Indication: NSTEMI  Allergies  Allergen Reactions  . Sulfa Antibiotics   . Vioxx (Rofecoxib)     Patient Measurements: Height: 5\' 8"  (172.7 cm) Weight: 151 lb 14.4 oz (68.9 kg) IBW/kg (Calculated) : 68.4    Vital Signs: Temp: 98.4 F (36.9 C) (12/09 1317) Temp src: Oral (12/09 1317) BP: 101/62 mmHg (12/09 1317) Pulse Rate: 66  (12/09 1317)  Labs:  Basename 05/13/11 0535 05/12/11 2316 05/12/11 1133 05/12/11 0600 05/12/11 0034 05/11/11 1946 05/11/11 1945 05/11/11 1138  HGB 11.1* -- -- 12.3* -- -- -- --  HCT 33.1* -- -- 37.0* -- 36.1* -- --  PLT 272 -- -- 317 -- 321 -- --  APTT -- -- -- -- -- 36 -- --  LABPROT -- -- -- -- -- 13.7 -- --  INR -- -- -- -- -- 1.03 -- --  HEPARINUNFRC 0.53 0.47 0.28* -- -- -- -- --  CREATININE -- -- -- -- -- 0.84 -- 0.85  CKTOTAL -- -- -- 115 112 -- 130 --  CKMB -- -- -- 1.9 2.2 -- 2.3 --  TROPONINI -- -- -- <0.30 <0.30 -- <0.30 --   Estimated Creatinine Clearance: 79.2 ml/min (by C-G formula based on Cr of 0.84).   Medications:  Scheduled:    . aspirin EC  81 mg Oral Daily  . glipiZIDE  10 mg Oral BID AC  . insulin aspart  0-5 Units Subcutaneous QHS  . insulin aspart  0-9 Units Subcutaneous TID WC  . lisinopril  10 mg Oral Daily  . metoprolol tartrate  50 mg Oral BID  . nitroGLYCERIN  1 inch Topical Q6H  . pantoprazole  40 mg Oral Daily  . rosuvastatin  20 mg Oral Daily  . sodium chloride  3 mL Intravenous Q12H   Infusions:    . heparin 1,050 Units/hr (05/12/11 1528)    Assessment: 70yo male now therapeutic on heparin for NSTEMI.   Goal of Therapy:  Heparin level 0.3-0.7 units/ml   Plan:  Continue Heparin at 1050 units/hr. Follow up after planned cath tomorrow.  Teneka Malmberg, Elisha Headland, Pharm.D. 05/13/2011 2:23 PM

## 2011-05-13 NOTE — Progress Notes (Signed)
Pt. Seen and examined. Agree with the NP/PA-C note as written. PVC's less frequent .Marland Kitchen Still some periods of bigemeny. Plan LHC in am tomorrow to evaluate for ischemia.  Chrystie Nose, MD Attending Cardiologist [facility]

## 2011-05-14 ENCOUNTER — Other Ambulatory Visit: Payer: Self-pay

## 2011-05-14 ENCOUNTER — Encounter (HOSPITAL_COMMUNITY)
Admission: EM | Disposition: A | Discharge status: Home / Self Care | Payer: Self-pay | Source: Home / Self Care | Attending: Internal Medicine

## 2011-05-14 HISTORY — PX: LEFT HEART CATHETERIZATION WITH CORONARY ANGIOGRAM: SHX5451

## 2011-05-14 LAB — HEPARIN LEVEL (UNFRACTIONATED): Heparin Unfractionated: 0.6 IU/mL (ref 0.30–0.70)

## 2011-05-14 LAB — BASIC METABOLIC PANEL
BUN: 13 mg/dL (ref 6–23)
CO2: 30 mEq/L (ref 19–32)
Calcium: 9.4 mg/dL (ref 8.4–10.5)
Chloride: 104 mEq/L (ref 96–112)
Creatinine, Ser: 0.91 mg/dL (ref 0.50–1.35)
GFR calc Af Amer: >90 mL/min (ref >90)
GFR calc non Af Amer: 84 mL/min — ABNORMAL LOW (ref >90)
Glucose, Bld: 173 mg/dL — ABNORMAL HIGH (ref 70–99)
Potassium: 4.4 mEq/L (ref 3.5–5.1)
Sodium: 141 mEq/L (ref 135–145)

## 2011-05-14 LAB — GLUCOSE, CAPILLARY
Glucose-Capillary: 130 mg/dL — ABNORMAL HIGH (ref 70–99)
Glucose-Capillary: 132 mg/dL — ABNORMAL HIGH (ref 70–99)

## 2011-05-14 LAB — CBC
HCT: 37.6 % — ABNORMAL LOW (ref 39.0–52.0)
Hemoglobin: 12.3 g/dL — ABNORMAL LOW (ref 13.0–17.0)
MCHC: 32.7 g/dL (ref 30.0–36.0)
RBC: 4.46 MIL/uL (ref 4.22–5.81)

## 2011-05-14 SURGERY — LEFT HEART CATHETERIZATION WITH CORONARY ANGIOGRAM
Anesthesia: LOCAL

## 2011-05-14 MED ORDER — SODIUM CHLORIDE 0.9 % IJ SOLN: 3.0000 mL | Freq: Two times a day (BID) | INTRAMUSCULAR | Status: DC

## 2011-05-14 MED ORDER — METOPROLOL TARTRATE 50 MG PO TABS: 50.0000 mg | ORAL_TABLET | Freq: Two times a day (BID) | ORAL | Status: DC

## 2011-05-14 MED ORDER — MORPHINE SULFATE 2 MG/ML IJ SOLN: 1.0000 mg | INTRAMUSCULAR | Status: DC | PRN

## 2011-05-14 MED ORDER — FENTANYL CITRATE 0.05 MG/ML IJ SOLN
INTRAMUSCULAR | Status: AC
  Filled 2011-05-14: qty 2

## 2011-05-14 MED ORDER — ACETAMINOPHEN 325 MG PO TABS
650.0000 mg | ORAL_TABLET | ORAL | Status: DC | PRN
  Administered 2011-05-14: 650 mg via ORAL

## 2011-05-14 MED ORDER — NITROGLYCERIN 0.4 MG SL SUBL: 0.4000 mg | SUBLINGUAL_TABLET | SUBLINGUAL | Status: DC | PRN

## 2011-05-14 MED ORDER — LIDOCAINE HCL (PF) 1 % IJ SOLN
INTRAMUSCULAR | Status: AC
  Filled 2011-05-14: qty 30

## 2011-05-14 MED ORDER — NITROGLYCERIN 0.2 MG/ML ON CALL CATH LAB
INTRAVENOUS | Status: AC
  Filled 2011-05-14: qty 1

## 2011-05-14 MED ORDER — HEPARIN SODIUM (PORCINE) 1000 UNIT/ML IJ SOLN
INTRAMUSCULAR | Status: AC
  Filled 2011-05-14: qty 1

## 2011-05-14 MED ORDER — MIDAZOLAM HCL 2 MG/2ML IJ SOLN
INTRAMUSCULAR | Status: AC
  Filled 2011-05-14: qty 2

## 2011-05-14 MED ORDER — SODIUM CHLORIDE 0.9 % IJ SOLN: 3.0000 mL | INTRAMUSCULAR | Status: DC | PRN

## 2011-05-14 MED ORDER — LISINOPRIL 10 MG PO TABS: 10.0000 mg | ORAL_TABLET | Freq: Every day | ORAL | Status: DC

## 2011-05-14 MED ORDER — VERAPAMIL HCL 2.5 MG/ML IV SOLN
INTRAVENOUS | Status: AC
  Filled 2011-05-14: qty 2

## 2011-05-14 MED ORDER — METFORMIN HCL 500 MG PO TABS: 1000.0000 mg | ORAL_TABLET | Freq: Two times a day (BID) | ORAL | Status: DC

## 2011-05-14 MED ORDER — ACETAMINOPHEN 325 MG PO TABS: 650.0000 mg | ORAL_TABLET | ORAL | Status: AC | PRN

## 2011-05-14 MED ORDER — SODIUM CHLORIDE 0.9 % IV SOLN: 250.0000 mL | INTRAVENOUS | Status: DC | PRN

## 2011-05-14 MED ORDER — HEPARIN (PORCINE) IN NACL 2-0.9 UNIT/ML-% IJ SOLN
INTRAMUSCULAR | Status: AC
  Filled 2011-05-14: qty 2000

## 2011-05-14 MED ORDER — SODIUM CHLORIDE 0.9 % IV SOLN: 1.0000 mL/kg/h | INTRAVENOUS | Status: DC

## 2011-05-14 NOTE — Progress Notes (Signed)
Subjective:  No chest pain  Objective:  Vital Signs in the last 24 hours: Temp:  [97.2 F (36.2 C)-98.4 F (36.9 C)] 97.7 F (36.5 C) (12/10 0555) Pulse Rate:  [62-75] 62  (12/10 0555) Resp:  [18-20] 18  (12/10 0555) BP: (101-126)/(62-81) 126/81 mmHg (12/10 0555) SpO2:  [98 %-99 %] 99 % (12/10 0555) Weight:  [68.266 kg (150 lb 8 oz)] 150 lb 8 oz (68.266 kg) (12/10 0555)  Intake/Output from previous day:  Intake/Output Summary (Last 24 hours) at 05/14/11 0855 Last data filed at 05/14/11 0500  Gross per 24 hour  Intake    720 ml  Output   1500 ml  Net   -780 ml    Physical Exam: General appearance: alert, cooperative and no distress Lungs: clear to auscultation bilaterally Heart: regular rate and rhythm, S1, S2 normal, no murmur, click, rub or gallop   Rate: 62  Rhythm: normal sinus rhythm and premature ventricular contractions (PVC)  Lab Results:  [facility] 05/14/11 0625 05/13/11 0535  WBC 5.5 5.9  HGB 12.3* 11.1*  PLT 313 272    Basename 05/14/11 0625 05/11/11 1946  NA 141 140  K 4.4 4.1  CL 104 101  CO2 30 27  GLUCOSE 173* 76  BUN 13 15  CREATININE 0.91 0.84    Basename 05/12/11 0600 05/12/11 0034  TROPONINI <0.30 <0.30   Hepatic Function Panel  Basename 05/11/11 1946  PROT 7.2  ALBUMIN 3.8  AST 24  ALT 29  ALKPHOS 100  BILITOT 0.4  BILIDIR --  IBILI --   No results found for this basename: CHOL in the last 72 hours  Basename 05/11/11 1946  INR 1.03    Imaging: No results found.  Cardiac Studies: Previous Cath studies reviewed.  Assessment/Plan:   Principal Problem:  *Acute coronary syndrome Active Problems:  CAD (coronary artery disease), small Dx branch occl 2009  Diabetes mellitus  HTN (hypertension)  Dyslipidemia  Palpitations  Symptomatic PVCs  Family history of early CAD  Plan- cath  Corine Shelter PA-C 05/14/2011, 8:55 AM   I have seen and examined the patient along with Corine Shelter, PA.  I have reviewed the chart,  notes and new data.  I agree with Luke's note.  Key new complaints: No further CP or SOB Key examination changes: None, agree with Luke's exam. Key new findings / data: CE negative for MI, Cr & CBC stable  PLAN: Agree with plan LHC today +/- PCI.  Marykay Lex, M.D., M.S. [facility] [id number] [place], [place]  [address]  [phone]  05/14/2011 9:18 AM

## 2011-05-14 NOTE — Progress Notes (Signed)
05/14/11 1150 Nursing Note: Pt arrival back to unit, pt stable with no complaints. Pt right radial level 0. No hematomas, bleeding, swelling or pain present. Pt's vital signs within normal limits. Will continue to monitor pt. Cara Thaxton Scientist, clinical (histocompatibility and immunogenetics).

## 2011-05-14 NOTE — Progress Notes (Signed)
05/14/11 1712 Nursing Note : Pt to be discharged home per MD's order. Pt has received all discharge information including post cath instructions. Pt verbalized understanding. Family at bedside and also verbalized understanding. Right radial level 0 no complaints at this time. Will escort pt to car safely upon discharge. Angel Dixon Scientist, clinical (histocompatibility and immunogenetics).

## 2011-05-14 NOTE — Discharge Summary (Signed)
Patient ID: HARLO FABELA,  MRN: [id number], DOB/AGE: [birth year] 70 y.o.  Admit date: 05/11/2011 Discharge date: 05/14/2011  Primary Care Provider: Dr Michelle Nasuti Primary Cardiologist: Dr Rennis Golden  Discharge Diagnoses Principal Problem:  *Acute coronary syndrome  Active Problems:  CAD (coronary artery disease), small Dx branch occl 2009, cath this admission showed nonobstructive disease   Diabetes mellitus  HTN (hypertension)  Dyslipidemia  Palpitations  Symptomatic PVCs  Family history of early CAD    Procedures: Cardiac cath 05/14/11  HPI:  Mr. Borcherding is a 70 year old male who has been seen by [place] in the past. He had a catheterization in 2007 for chest pain that showed a 60-70% branch of the diagonal. This was treated medically. In 2009 he had a small non-ST elevation MI with a troponin of 1.13. He was restudied at that time and the branch off the diagonal had now occluded. Otherwise he had 40% mid LAD but no other significant coronary disease and good LV function. He has not seen [place] back since. He is followed by Dr. Katharine Look. He is admitted now as a transfer from [facility] The patient presented to the emergency room there after complaining of 2 weeks of gradually increasing indigestion and chest tightness and palpitations. He's had frequent PVCs on telemetry. His EKG shows no other acute changes. He denies any radiation to his jaw or arms. He denies any associated nausea vomiting diaphoresis. He did not use nitroglycerin.    Hospital Course: Mr. Fusselman is a 70 year old male had been seen by [place] in the past. He had a catheterization in 2007 that showed moderate disease in a diagonal branch. In 2090 a small non-ST elevation MI and was treated medically. The diagonal was occluded by that time. He had minor disease otherwise but no critical coronary disease. He's not seen [place]  since. He presented to [facility] with chest pressure and palpitations, in  retrospect I suspect that most of his symptoms were secondary to palpitations. He was having frequent PVCs. He was admitted and put on heparin and nitrates and his beta blocker was uptitrated. He underwent diagnostic catheterization on 05/14/2011 by Dr. Herbie Baltimore. This showed moderate nonobstructive coronary disease. Plan is for continued medical therapy. He'll see Dr. Rennis Golden back in followup in [place].  Discharge Vitals:  Blood pressure 104/71, pulse 80, temperature 97.7 F (36.5 C), temperature source Oral, resp. rate 18, height 5\' 8"  (1.727 m), weight 68.266 kg (150 lb 8 oz), SpO2 99.00%.    Labs: Results for orders placed during the hospital encounter of 05/11/11 (from the past 48 hour(s))  GLUCOSE, CAPILLARY     Status: Abnormal   Collection Time   05/12/11  4:08 PM      Component Value Range Comment   Glucose-Capillary 194 (*) 70 - 99 (mg/dL)   GLUCOSE, CAPILLARY     Status: Abnormal   Collection Time   05/12/11  9:36 PM      Component Value Range Comment   Glucose-Capillary 135 (*) 70 - 99 (mg/dL)   HEPARIN LEVEL (UNFRACTIONATED)     Status: Normal   Collection Time   05/12/11 11:16 PM      Component Value Range Comment   Heparin Unfractionated 0.47  0.30 - 0.70 (IU/mL)   HEPARIN LEVEL (UNFRACTIONATED)     Status: Normal   Collection Time   05/13/11  5:35 AM      Component Value Range Comment   Heparin Unfractionated 0.53  0.30 - 0.70 (IU/mL)  CBC     Status: Abnormal   Collection Time   05/13/11  5:35 AM      Component Value Range Comment   WBC 5.9  4.0 - 10.5 (K/uL)    RBC 3.98 (*) 4.22 - 5.81 (MIL/uL)    Hemoglobin 11.1 (*) 13.0 - 17.0 (g/dL)    HCT 04.5 (*) 40.9 - 52.0 (%)    MCV 83.2  78.0 - 100.0 (fL)    MCH 27.9  26.0 - 34.0 (pg)    MCHC 33.5  30.0 - 36.0 (g/dL)    RDW 81.1  91.4 - 78.2 (%)    Platelets 272  150 - 400 (K/uL)   GLUCOSE, CAPILLARY     Status: Abnormal   Collection Time   05/13/11  6:16 AM      Component Value Range Comment   Glucose-Capillary 182  (*) 70 - 99 (mg/dL)   GLUCOSE, CAPILLARY     Status: Abnormal   Collection Time   05/13/11 11:39 AM      Component Value Range Comment   Glucose-Capillary 164 (*) 70 - 99 (mg/dL)    Comment 1 Notify RN     GLUCOSE, CAPILLARY     Status: Abnormal   Collection Time   05/13/11  5:10 PM      Component Value Range Comment   Glucose-Capillary 105 (*) 70 - 99 (mg/dL)    Comment 1 Documented in Chart      Comment 2 Notify RN     GLUCOSE, CAPILLARY     Status: Abnormal   Collection Time   05/13/11  9:17 PM      Component Value Range Comment   Glucose-Capillary 187 (*) 70 - 99 (mg/dL)   HEPARIN LEVEL (UNFRACTIONATED)     Status: Normal   Collection Time   05/14/11  6:25 AM      Component Value Range Comment   Heparin Unfractionated 0.60  0.30 - 0.70 (IU/mL)   CBC     Status: Abnormal   Collection Time   05/14/11  6:25 AM      Component Value Range Comment   WBC 5.5  4.0 - 10.5 (K/uL)    RBC 4.46  4.22 - 5.81 (MIL/uL)    Hemoglobin 12.3 (*) 13.0 - 17.0 (g/dL)    HCT 95.6 (*) 21.3 - 52.0 (%)    MCV 84.3  78.0 - 100.0 (fL)    MCH 27.6  26.0 - 34.0 (pg)    MCHC 32.7  30.0 - 36.0 (g/dL)    RDW 08.6  57.8 - 46.9 (%)    Platelets 313  150 - 400 (K/uL)   BASIC METABOLIC PANEL     Status: Abnormal   Collection Time   05/14/11  6:25 AM      Component Value Range Comment   Sodium 141  135 - 145 (mEq/L)    Potassium 4.4  3.5 - 5.1 (mEq/L)    Chloride 104  96 - 112 (mEq/L)    CO2 30  19 - 32 (mEq/L)    Glucose, Bld 173 (*) 70 - 99 (mg/dL)    BUN 13  6 - 23 (mg/dL)    Creatinine, Ser 6.29  0.50 - 1.35 (mg/dL)    Calcium 9.4  8.4 - 10.5 (mg/dL)    GFR calc non Af Amer 84 (*) >90 (mL/min)    GFR calc Af Amer >90  >90 (mL/min)   GLUCOSE, CAPILLARY     Status: Abnormal   Collection Time  05/14/11 10:20 AM      Component Value Range Comment   Glucose-Capillary 130 (*) 70 - 99 (mg/dL)   GLUCOSE, CAPILLARY     Status: Abnormal   Collection Time   05/14/11 11:51 AM      Component Value Range  Comment   Glucose-Capillary 134 (*) 70 - 99 (mg/dL)    Comment 1 Notify RN      Comment 2 Documented in Chart       Disposition:  Follow-up Information    Follow up with KNOWLTON,STEPHEN D      Follow up with HILTY,Kenneth C on 05/31/2011. (9:30 am in [place])    Contact information:   [phone]          Discharge Medications:  Current Discharge Medication List    START taking these medications   Details  acetaminophen (TYLENOL) 325 MG tablet Take 2 tablets (650 mg total) by mouth every 4 (four) hours as needed. Qty: 30 tablet    nitroGLYCERIN (NITROSTAT) 0.4 MG SL tablet Place 1 tablet (0.4 mg total) under the tongue every 5 (five) minutes as needed for chest pain (severe chest pain or pressure only). Qty: 25 tablet, Refills: 2      CONTINUE these medications which have CHANGED   Details  lisinopril (PRINIVIL,ZESTRIL) 10 MG tablet Take 1 tablet (10 mg total) by mouth daily. Qty: 30 tablet, Refills: 5    metFORMIN (GLUCOPHAGE) 500 MG tablet Take 2 tablets (1,000 mg total) by mouth 2 (two) times daily with a meal.    metoprolol (LOPRESSOR) 50 MG tablet Take 1 tablet (50 mg total) by mouth 2 (two) times daily. Qty: 60 tablet, Refills: 5      CONTINUE these medications which have NOT CHANGED   Details  aspirin EC 81 MG tablet Take 81 mg by mouth daily.      atorvastatin (LIPITOR) 40 MG tablet Take 40 mg by mouth daily.      glipiZIDE (GLUCOTROL) 10 MG tablet Take 10 mg by mouth 2 (two) times daily before a meal.      pantoprazole (PROTONIX) 40 MG tablet Take 40 mg by mouth daily.      sodium chloride (OCEAN) 0.65 % nasal spray Place 1 spray into the nose as needed. sinus       STOP taking these medications     Multiple Vitamins-Minerals (CENTRUM SILVER ULTRA MENS PO)         Outstanding Labs/Studies  Duration of Discharge Encounter: Greater than 30 minutes including physician time.  Signed, Corine Shelter PA-C 05/14/2011 3:00 PM  I have seen and  examined the patient along with Corine Shelter, PA-C.  I have reviewed the chart, notes and new data.  I agree with PA's note.  Key new complaints: no further angina Key examination changes: still has occasional episodes of bigeminal PVCs; these may be the source of his symptoms Key new findings / data: See Cath report with nonobstructive coronary lesions  PLAN: Discharge home follow up with Dr. Rennis Golden in the regional office  Ayaan Shutes 05/14/2011, 4:18 PM

## 2011-05-14 NOTE — H&P (Addendum)
  History and Physical Interval Note:  NAME:  Angel Dixon   MRN: [id number] DOB:  1940/08/20   ADMIT DATE: 05/11/2011   05/14/2011 9:21 AM  Angel Dixon is a 70 y.o. male has presented today for surgery, with the diagnosis of chest pain and PVCs.  See H&P and today's progress note for full details.  He has ruled out for MI.    The various methods of treatment have been discussed with the patient and family. After consideration of risks, benefits and other options for treatment, the patient has consented to Procedure(s):  LEFT HEART CATHETERIZATION with Left Ventriculography AND CORONARY ANGIOGRAPHY +/- AD HOC PERCUTANEOUS CORONARY INTERVENTION  as a surgical intervention.   The patients' history has been reviewed, patient examined, no change in status from most recent note, stable for surgery. I have reviewed the patients' chart and labs. Questions were answered to the patient's satisfaction.   The procedure with Risks/Benefits/Alternatives and Indications was reviewed with the patient.  All questions were answered.    Risks / Complications include, but not limited to: Death, MI, CVA/TIA, VF/VT (with defibrillation), Bradycardia (need for temporary pacer placement), contrast induced nephropathy, bleeding / bruising / hematoma / pseudoaneurysm, vascular or coronary injury (with possible emergent CT or Vascular Surgery), adverse medication reactions, infection.    The patient voices understanding and agree to proceed.   I have signed the consent form and placed it on the chart for patient signature and RN witness.     Marykay Lex, M.D., M.S. [facility] [address] [place], [place]  [address]  [phone]  05/14/2011 9:21 AM

## 2011-05-14 NOTE — Op Note (Signed)
[facility]     CARDIAC CATHETERIZATION REPORT  NAME: Angel Dixon   MRN: [id number] DOB: 1940-12-20   ADMIT DATE:  05/11/2011  Performing Cardiologist: Marykay Lex  Primary Physician: Milana Obey, MD Primary Cardiologist:  Unknown, [facility]   Procedures Performed:  Left Heart Catheterization via 5 Fr Right Radial Artery Access  Left Ventriculography, RAO 11 ml/sec for 33 ml total contrast  Native Coronary Angiography  Indication(s): Known prior coronary artery disease; frequent PVCs; chest pain concerning for angina    History: 70 y.o. male has presented today for surgery, with the diagnosis of chest pain and PVCs. See H&P and today's progress note for full details. He has ruled out for MI. He is now referred for invasive cardiac evaluation with cardiac catheterization.  Consent: The procedure with Risks/Benefits/Alternatives and Indications was reviewed with the patient.  All questions were answered.    Risks / Complications include, but not limited to: Death, MI, CVA/TIA, VF/VT (with defibrillation), Bradycardia (need for temporary pacer placement), contrast induced nephropathy, bleeding / bruising / hematoma / pseudoaneurysm, vascular or coronary injury (with possible emergent CT or Vascular Surgery), adverse medication reactions, infection.    The patient voice understanding and agree to proceed.     Consent for signed by MD and patient with RN witness -- placed on chart.  Procedure: The patient was brought to the 2nd Floor  Cardiac Catheterization Lab in the fasting state and prepped and draped in the usual sterile fashion for Right groin or radial) access. (A modified Allen's test with plethysmography was performed on the right wrist demonstrating adequate Ulnar Artery collateral flow).    Sterile technique was used including antiseptics, cap, gloves, gown, hand hygiene, mask and sheet.  Skin prep: Chlorhexidine;  Time  Out: Verified patient identification, verified procedure, site/side was marked, verified correct patient position, special equipment/implants available, medications/allergies/relevent history reviewed, required imaging and test results available.  Performed  The right wrist was anesthetized with 1% subcutaneous Lidocaine.  The right radial artery was accessed using the Seldinger Technique with placement of a 5 Fr Glide Sheath. The sheath was aspirated and flushed.  Then a total of 10 ml of standard Radial Artery Cocktail (see medications) was infused.  Radial Cocktail: 5 mg Verapamil, 400 mcg NTG, 2 ml 2% Lidocaine  A 5 Fr TIG 4.0 Catheter was advanced of over a long exchange safety J wire into the ascending Aorta.  The catheter was used to engage the right and left coronary artery.  Multiple cineangiographic views of the right than left coronary artery system(s) were performed.   This catheter was then exchanged over the Long Exchange Safety J wire for an angled Pigtail catheter that was advanced across the Aortic Valve.  LV hemodynamics were measured (and) Left Ventriculography was performed in the RAO projection.  LV hemodynamics were then re-sampled, and the catheter was pulled back across the Aortic Valve for measurement of "pull-back" gradient.  The catheter and the wire was removed completely out of the body.  The sheath was removed in the Cath Lab with a TR band placed at 10 ml Air at 1000 hours.  Reverse Allen's test revealed non-occlusive hemostasis.   The patient was transported to the PACU in stable condition.   The patient  was stable before, during and following the procedure.   Patient did tolerate procedure well. There were not complications. EBL: <21ml  Medications:  Premedication: 5 mg  Valium  Sedation:  1 mg IV  Versed, 25 mcg IV Fentanyl  Contrast:  80 Omnipaque  Heparin 4000 units IV at time of sheath placement  Hemodynamics:  Normal Aortic Pressures, with no AoV  gradient.  Normal LVEDP ~55mmHg  Left Ventriculography:  EF:  55-60%  Wall Motion: No significant wall motion abnormality; 1+ MR  Coronary Angiographic Data: Right dominant  Left Main:  Large caliber, bifurcates to the LAD and Circumflex, angiographically normal  Left Anterior Descending (LAD):  Moderate caliber, tapers the apex no significant disease  Several small to moderate diagonal branches small-caliber no significant disease the  Circumflex (LCx):  Moderate to large caliber, 40-50% mid vessel stenosis, no other significant disease  1st obtuse marginal:  Moderate caliber, Proximal ~60% in a <2.0 mm vessel  2nd obtuse marginal:  Small - moderate, minimal luminal iregularities 3rd obtuse marginal:  Small - moderate, minimal luminal iregularities   posterior lateral branch:  Small - moderate, minimal luminal iregularities  Right Coronary Artery: Large Caliber, minimal luminal irregularities  right ventricle branch of right coronary artery: very small, luminal irregularities  posterior descending artery: prox ~40%  posterior lateral branch:  3 branches - small, minimal luminal irregularities.  Impression: 1.  Nonobstructive coronary disease 2.  Preserved left ventricular ejection fraction  Plan: 1.  Anticipate discharge later on this afternoon, provided heart rate And blood pressure are stable 2.  Will increase beta blocker and ACE inhibitor dose.  The case and results was discussed with the patient (and family). The case and results was not discussed with the patient's PCP. The case and results was not discussed with the patient's Cardiologist.  Time Spend Directly with Patient:  45 minutes  Demarrion Meiklejohn W, M.D., M.S. [facility] [address] [place], [place]  [address]  [phone]  05/14/2011 8:15 PM

## 2011-05-14 NOTE — Brief Op Note (Addendum)
05/11/2011 - 05/14/2011  10:14 AM  PATIENT:  Angel Dixon  70 y.o. male  PRE-OPERATIVE DIAGNOSIS:  [place] - frequent PVCs   POST-OPERATIVE DIAGNOSIS:  Non-obstructive CAD  PROCEDURE:   LEFT HEART CATHETERIZATION WITH CORONARY ANGIOGRAM and LEFT VENTRICULOGRAM - via 5Fr Right Radial Artery Access  Surgeon(s): Marykay Lex  ASSISTANTS: Lauro Regulus, RN   ANESTHESIA:   local and IV sedation; 1mg  Versed, 25 mcg Fentanyl  EBL:   <10 ml   LOCAL MEDICATIONS USED:  LIDOCAINE 2 ml   TOURNIQUET: TR Band - 10 ml air, @ 1000 hrs.  DICTATION: .Note written in EPIC No angiographic evidence of obstructive CAD; ~60% prox RPL and ~40-50% mid Cx as most significant stenosis EF ~55-60%, no AoV gradient, ~1+ MR  PLAN OF CARE: Discharge to home after PACU - likely if BP & PVCs / Symptoms stable  PATIENT DISPOSITION:  PACU - hemodynamically stable.   Delay start of Pharmacological VTE agent (>24hrs) due to surgical blood loss or risk of bleeding: NOT APPLICABLE   HARDING,DAVID W, M.D., M.S. [facility] [address] [place], [place]  [address]  [phone]  05/14/2011 10:18 AM

## 2012-03-27 ENCOUNTER — Observation Stay (HOSPITAL_COMMUNITY)
Admission: EM | Admit: 2012-03-27 | Discharge: 2012-03-29 | Disposition: A | Discharge status: Home / Self Care | Payer: Medicare Other | Attending: Family Medicine | Admitting: Family Medicine

## 2012-03-27 ENCOUNTER — Encounter (HOSPITAL_COMMUNITY): Payer: Self-pay | Admitting: Emergency Medicine

## 2012-03-27 ENCOUNTER — Emergency Department (HOSPITAL_COMMUNITY): Payer: Medicare Other

## 2012-03-27 DIAGNOSIS — I10 Essential (primary) hypertension: Secondary | ICD-10-CM | POA: Diagnosis present

## 2012-03-27 DIAGNOSIS — E119 Type 2 diabetes mellitus without complications: Secondary | ICD-10-CM | POA: Insufficient documentation

## 2012-03-27 DIAGNOSIS — R079 Chest pain, unspecified: Secondary | ICD-10-CM | POA: Diagnosis present

## 2012-03-27 DIAGNOSIS — I2510 Atherosclerotic heart disease of native coronary artery without angina pectoris: Principal | ICD-10-CM | POA: Diagnosis present

## 2012-03-27 DIAGNOSIS — R002 Palpitations: Secondary | ICD-10-CM | POA: Diagnosis present

## 2012-03-27 DIAGNOSIS — R0602 Shortness of breath: Secondary | ICD-10-CM | POA: Insufficient documentation

## 2012-03-27 DIAGNOSIS — E785 Hyperlipidemia, unspecified: Secondary | ICD-10-CM | POA: Diagnosis present

## 2012-03-27 DIAGNOSIS — I701 Atherosclerosis of renal artery: Secondary | ICD-10-CM | POA: Insufficient documentation

## 2012-03-27 LAB — CBC WITH DIFFERENTIAL/PLATELET
Basophils Absolute: 0 10*3/uL (ref 0.0–0.1)
Eosinophils Absolute: 0.3 10*3/uL (ref 0.0–0.7)
Eosinophils Relative: 4 % (ref 0–5)
Lymphocytes Relative: 30 % (ref 12–46)
MCV: 84.8 fL (ref 78.0–100.0)
Platelets: 344 10*3/uL (ref 150–400)
RDW: 12.5 % (ref 11.5–15.5)
WBC: 6.9 10*3/uL (ref 4.0–10.5)

## 2012-03-27 LAB — PRO B NATRIURETIC PEPTIDE: Pro B Natriuretic peptide (BNP): 14.8 pg/mL (ref 0–125)

## 2012-03-27 LAB — COMPREHENSIVE METABOLIC PANEL
ALT: 19 U/L (ref 0–53)
AST: 19 U/L (ref 0–37)
Albumin: 4.1 g/dL (ref 3.5–5.2)
CO2: 28 mEq/L (ref 19–32)
Calcium: 9.7 mg/dL (ref 8.4–10.5)
Sodium: 138 mEq/L (ref 135–145)
Total Protein: 7.9 g/dL (ref 6.0–8.3)

## 2012-03-27 LAB — PROTIME-INR
INR: 1 (ref 0.00–1.49)
Prothrombin Time: 13.1 seconds (ref 11.6–15.2)

## 2012-03-27 MED ORDER — NITROGLYCERIN 2 % TD OINT
1.0000 [in_us] | TOPICAL_OINTMENT | Freq: Once | TRANSDERMAL | Status: AC
  Administered 2012-03-27: 1 [in_us] via TOPICAL
  Filled 2012-03-27: qty 1

## 2012-03-27 MED ORDER — ASPIRIN 81 MG PO CHEW
324.0000 mg | CHEWABLE_TABLET | Freq: Once | ORAL | Status: AC
  Administered 2012-03-27: 324 mg via ORAL
  Filled 2012-03-27: qty 4

## 2012-03-27 NOTE — ED Provider Notes (Signed)
History   This chart was scribed for Ward Givens, MD by Gerlean Ren. This patient was seen in room [facility] and the patient's care was started at 22:41.   CSN: [id number]  Arrival date & time 03/27/12  2224   First MD Initiated Contact with Patient 03/27/12 2235      Chief Complaint  Patient presents with  . Chest Pain  . Shortness of Breath    (Consider location/radiation/quality/duration/timing/severity/associated sxs/prior treatment) Patient is a 71 y.o. male presenting with shortness of breath. The history is provided by the patient. No language interpreter was used.  Shortness of Breath  Associated symptoms include chest pain and shortness of breath.   Angel Dixon is a 71 y.o. male who presents to the Emergency Department complaining of 2 weeks of waxing-and-waning, sore, non-radiating chest pain and tightness that normally lasts about 2 hours with no obvious factors that trigger onset but that is sometimes worsened by exertion.  Pain causes associated dyspnea and sweating tonight.  Pain has worsened over past 2 days and pain has been lasting for longer than 2 hours.  Pt reports new palpitations earlier today.  He reports this pain started about 1 pm today. Pain is currently described as 4/10 but was as high as 7/10 earlier today.  Pt has h/o HTN and DM.  Patient states his pain is in the center of his chest. He denies any recent injury or change in his activity. He states the pain is aching and then becomes an indigestion type pain and "feels like someone sitting on my chest". He also states today he feels like his heart is pounding and skipping although he does not feel like it's racing. He states he did have diaphoresis today. He states he has noticed if he walks up the hill from his garage he gets the chest pain. Patient has nitroglycerin but he has not tried taking it for this pain.he states he has had a cardiac cath and he was told he had a blockage that was about  45%.  PCP is Dr. Sudie Bailey. Cardiologist is Dr. Rennis Golden.  Past Medical History  Diagnosis Date  . Hypertension   . Diabetes mellitus   . PUD (peptic ulcer disease)     endo 03/15/10  . 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 Dyslipidemia   . Dysrhythmia     "palpitation"  . Angina   . GERD (gastroesophageal reflux disease)     Past Surgical History  Procedure Date  . Hernia repair   . Back surgery 2005  . Lumbar laminectomy 09/2003    L2-3, 3-4, 4-5  . Cardiac catheterization ~ 2010  . Inguinal hernia repair 1982    right    Family History  Problem Relation Age of Onset  . Coronary artery disease Father     died 23  . Kidney failure Mother     died 77  . Sarcoidosis Daughter     died 22    History  Substance Use Topics  . Smoking status: Former Smoker -- 1.0 packs/day for 25 years    Types: Cigarettes  . Smokeless tobacco: Not on file   Comment: quit smoking cigarettes ~ 1990  . Alcohol Use: Yes     05/11/11 "shot every now and then"  retired Lives at home    Review of Systems  Respiratory: Positive for chest tightness and shortness of breath.   Cardiovascular: Positive for chest pain.  All other  systems reviewed and are negative.    Allergies  Sulfa antibiotics; Vioxx; and Yellow jacket venom  Home Medications   Current Outpatient Rx  Name Route Sig Dispense Refill  . ASPIRIN EC 81 MG PO TBEC Oral Take 81 mg by mouth daily.      . ATORVASTATIN CALCIUM 40 MG PO TABS Oral Take 40 mg by mouth daily.      Marland Kitchen LISINOPRIL 10 MG PO TABS Oral Take 1 tablet (10 mg total) by mouth daily. 30 tablet 5  . METFORMIN HCL 500 MG PO TABS Oral Take 2 tablets (1,000 mg total) by mouth 2 (two) times daily with a meal.      Dont take until 12/13  . NITROGLYCERIN 0.4 MG SL SUBL Sublingual Place 1 tablet (0.4 mg total) under the tongue every 5 (five) minutes as needed for chest pain (severe chest pain or pressure only). 25 tablet 2  . PANTOPRAZOLE SODIUM  40 MG PO TBEC Oral Take 40 mg by mouth daily.      Marland Kitchen SALINE NASAL SPRAY 0.65 % NA SOLN Nasal Place 1 spray into the nose as needed. sinus       BP 154/95  Pulse 80  Temp 97.8 F (36.6 C) (Oral)  Resp 18  Ht 5\' 8"  (1.727 m)  Wt 162 lb (73.483 kg)  BMI 24.63 kg/m2  SpO2 97%  Vital signs normal    Physical Exam  Nursing note and vitals reviewed. Constitutional: He is oriented to person, place, and time. He appears well-developed and well-nourished.  Non-toxic appearance. He does not appear ill. No distress.  HENT:  Head: Normocephalic and atraumatic.  Right Ear: External ear normal.  Left Ear: External ear normal.  Nose: Nose normal. No mucosal edema or rhinorrhea.  Mouth/Throat: Oropharynx is clear and moist and mucous membranes are normal. No dental abscesses or uvula swelling.  Eyes: Conjunctivae normal and EOM are normal. Pupils are equal, round, and reactive to light.  Neck: Normal range of motion and full passive range of motion without pain. Neck supple.  Cardiovascular: Normal rate, regular rhythm and normal heart sounds.  Exam reveals no gallop and no friction rub.   No murmur heard. Pulmonary/Chest: Effort normal and breath sounds normal. No respiratory distress. He has no wheezes. He has no rhonchi. He has no rales. He exhibits tenderness. He exhibits no crepitus.    Abdominal: Soft. Normal appearance and bowel sounds are normal. He exhibits no distension. There is no tenderness. There is no rebound and no guarding.  Musculoskeletal: Normal range of motion. He exhibits no edema and no tenderness.       Moves all extremities well.   Neurological: He is alert and oriented to person, place, and time. He has normal strength. No cranial nerve deficit.  Skin: Skin is warm, dry and intact. No rash noted. No erythema. No pallor.  Psychiatric: He has a normal mood and affect. His speech is normal and behavior is normal. His mood appears not anxious.    ED Course  Procedures  (including critical care time)   Medications  nitroGLYCERIN (NITROGLYN) 2 % ointment 1 inch (1 inch Topical Given 03/27/12 2248)  aspirin chewable tablet 324 mg (324 mg Oral Given 03/27/12 2248)    DIAGNOSTIC STUDIES: Oxygen Saturation is 97% on room air, adequate by my interpretation.    COORDINATION OF CARE: 22:49- Patient informed of clinical course, understands medical decision-making process, and agrees with plan.  Ordered nitro, PO aspirin, CBC, c-met, troponin, APTT,  protime-INR, pro b natriuretic peptide, and chest XR.    Patient reports his pain is down to 2/10. He is agreeable to staying in the hospital overnight for a slow rule out and being evaluated in the morning by [facility] heart and vascular. His chest pain is worrisome to be CAD.   Pt left with Dr Manus Gunning at change of shift to talk to hospitalist about admitting for Dr Sudie Bailey  Cardiac Cath 05/11/11 Coronary Angiographic Data: Right dominant  Left Main: Large caliber, bifurcates to the LAD and Circumflex, angiographically normal  Left Anterior Descending (LAD): Moderate caliber, tapers the apex no significant disease  Several small to moderate diagonal branches small-caliber no significant disease the  Circumflex (LCx): Moderate to large caliber, 40-50% mid vessel stenosis, no other significant disease  1st obtuse marginal: Moderate caliber, Proximal ~60% in a <2.0 mm vessel  2nd obtuse marginal: Small - moderate, minimal luminal iregularities 3rd obtuse marginal: Small - moderate, minimal luminal iregularities  posterior lateral branch: Small - moderate, minimal luminal iregularities  Right Coronary Artery: Large Caliber, minimal luminal irregularities  right ventricle branch of right coronary artery: very small, luminal irregularities  posterior descending artery: prox ~40%  posterior lateral branch: 3 branches - small, minimal luminal irregularities.      Results for orders placed during the hospital  encounter of 03/27/12  CBC WITH DIFFERENTIAL      Component Value Range   WBC 6.9  4.0 - 10.5 K/uL   RBC 4.53  4.22 - 5.81 MIL/uL   Hemoglobin 12.8 (*) 13.0 - 17.0 g/dL   HCT 98.1 (*) 19.1 - 47.8 %   MCV 84.8  78.0 - 100.0 fL   MCH 28.3  26.0 - 34.0 pg   MCHC 33.3  30.0 - 36.0 g/dL   RDW 29.5  62.1 - 30.8 %   Platelets 344  150 - 400 K/uL   Neutrophils Relative 57  43 - 77 %   Neutro Abs 3.9  1.7 - 7.7 K/uL   Lymphocytes Relative 30  12 - 46 %   Lymphs Abs 2.0  0.7 - 4.0 K/uL   Monocytes Relative 9  3 - 12 %   Monocytes Absolute 0.6  0.1 - 1.0 K/uL   Eosinophils Relative 4  0 - 5 %   Eosinophils Absolute 0.3  0.0 - 0.7 K/uL   Basophils Relative 1  0 - 1 %   Basophils Absolute 0.0  0.0 - 0.1 K/uL  COMPREHENSIVE METABOLIC PANEL      Component Value Range   Sodium 138  135 - 145 mEq/L   Potassium 3.9  3.5 - 5.1 mEq/L   Chloride 97  96 - 112 mEq/L   CO2 28  19 - 32 mEq/L   Glucose, Bld 96  70 - 99 mg/dL   BUN 17  6 - 23 mg/dL   Creatinine, Ser 6.57  0.50 - 1.35 mg/dL   Calcium 9.7  8.4 - 84.6 mg/dL   Total Protein 7.9  6.0 - 8.3 g/dL   Albumin 4.1  3.5 - 5.2 g/dL   AST 19  0 - 37 U/L   ALT 19  0 - 53 U/L   Alkaline Phosphatase 108  39 - 117 U/L   Total Bilirubin 0.6  0.3 - 1.2 mg/dL   GFR calc non Af Amer 74 (*) >90 mL/min   GFR calc Af Amer 85 (*) >90 mL/min  TROPONIN I      Component Value Range  Troponin I <0.30  <0.30 ng/mL  APTT      Component Value Range   aPTT 38 (*) 24 - 37 seconds  PROTIME-INR      Component Value Range   Prothrombin Time 13.1  11.6 - 15.2 seconds   INR 1.00  0.00 - 1.49  PRO B NATRIURETIC PEPTIDE      Component Value Range   Pro B Natriuretic peptide (BNP) 14.8  0 - 125 pg/mL   Laboratory interpretation all normal except minimal anemia   Dg Chest Portable 1 View  03/27/2012  *RADIOLOGY REPORT*  Clinical Data: Mid chest pain.  PORTABLE CHEST - 1 VIEW  Comparison: 05/11/2011  Findings: Lungs clear bilaterally. Heart and mediastinum are  within normal limits.  The trachea is midline.  Negative for a pneumothorax.  IMPRESSION: No acute chest findings.   Original Report Authenticated By: Richarda Overlie, M.D.     Date: 03/27/2012  Rate: 77  Rhythm: normal sinus rhythm and sinus arrhythmia  QRS Axis: normal  Intervals: normal  ST/T Wave abnormalities: normal  Conduction Disutrbances:none  Narrative Interpretation:   Old EKG Reviewed: unchanged from 05/14/2011    1. Chest pain    Plan admission  MDM Pt with known CAD with blockage of 60% about 11 months ago, now with some exertional, but also nonexertional chest pain with secondary symptoms worrisome for CAD (SOB, Diaphoresis, palpitations). Will admit to have evaluation by his cardiologist [facility].   I personally performed the services described in this documentation, which was scribed in my presence. The recorded information has been reviewed and considered.  Devoria Albe, MD, Armando Gang         Ward Givens, MD 03/28/12 [phone]

## 2012-03-27 NOTE — ED Notes (Signed)
Pt states that he has been experiencing chest pain on and off over the last couple weeks.  States that it has gotten a lot worse in the last couple of days.  Denies N/V.  Describes the chest pain as a tightness and real sore, chest pressure. Admits to blurred vision at times, SOB, and dizziness.

## 2012-03-28 ENCOUNTER — Encounter (HOSPITAL_COMMUNITY): Payer: Self-pay | Admitting: *Deleted

## 2012-03-28 DIAGNOSIS — R079 Chest pain, unspecified: Secondary | ICD-10-CM | POA: Diagnosis present

## 2012-03-28 HISTORY — PX: TRANSTHORACIC ECHOCARDIOGRAM: SHX275

## 2012-03-28 LAB — LIPID PANEL
HDL: 30 mg/dL — ABNORMAL LOW (ref >39)
LDL Cholesterol: 47 mg/dL (ref 0–99)

## 2012-03-28 LAB — GLUCOSE, CAPILLARY
Glucose-Capillary: 224 mg/dL — ABNORMAL HIGH (ref 70–99)
Glucose-Capillary: 115 mg/dL — ABNORMAL HIGH (ref 70–99)

## 2012-03-28 LAB — BASIC METABOLIC PANEL
CO2: 27 mEq/L (ref 19–32)
Calcium: 9.4 mg/dL (ref 8.4–10.5)
Chloride: 98 mEq/L (ref 96–112)
Glucose, Bld: 102 mg/dL — ABNORMAL HIGH (ref 70–99)
Sodium: 137 mEq/L (ref 135–145)

## 2012-03-28 LAB — RAPID URINE DRUG SCREEN, HOSP PERFORMED
Barbiturates: NOT DETECTED
Benzodiazepines: NOT DETECTED
Cocaine: NOT DETECTED

## 2012-03-28 LAB — CBC
HCT: 35.1 % — ABNORMAL LOW (ref 39.0–52.0)
Hemoglobin: 12 g/dL — ABNORMAL LOW (ref 13.0–17.0)
MCH: 28.8 pg (ref 26.0–34.0)
MCV: 84.2 fL (ref 78.0–100.0)
Platelets: 315 10*3/uL (ref 150–400)
RBC: 4.17 MIL/uL — ABNORMAL LOW (ref 4.22–5.81)
WBC: 6.7 10*3/uL (ref 4.0–10.5)

## 2012-03-28 LAB — TROPONIN I: Troponin I: <0.3 ng/mL (ref <0.30)

## 2012-03-28 LAB — HEMOGLOBIN A1C
Hgb A1c MFr Bld: 7.4 % — ABNORMAL HIGH (ref <5.7)
Mean Plasma Glucose: 166 mg/dL — ABNORMAL HIGH (ref <117)

## 2012-03-28 LAB — TSH: TSH: 3.537 u[IU]/mL (ref 0.350–4.500)

## 2012-03-28 MED ORDER — SODIUM CHLORIDE 0.9 % IV SOLN: 250.0000 mL | INTRAVENOUS | Status: DC | PRN

## 2012-03-28 MED ORDER — ATORVASTATIN CALCIUM 40 MG PO TABS
40.0000 mg | ORAL_TABLET | Freq: Every day | ORAL | Status: DC
  Administered 2012-03-28: 40 mg via ORAL
  Filled 2012-03-28: qty 1

## 2012-03-28 MED ORDER — ASPIRIN 300 MG RE SUPP
300.0000 mg | RECTAL | Status: AC
  Filled 2012-03-28: qty 1

## 2012-03-28 MED ORDER — METOPROLOL TARTRATE 25 MG PO TABS
25.0000 mg | ORAL_TABLET | Freq: Two times a day (BID) | ORAL | Status: DC
  Administered 2012-03-28 (×3): 25 mg via ORAL
  Filled 2012-03-28 (×3): qty 1

## 2012-03-28 MED ORDER — ALPRAZOLAM 0.25 MG PO TABS: 0.2500 mg | ORAL_TABLET | Freq: Two times a day (BID) | ORAL | Status: DC | PRN

## 2012-03-28 MED ORDER — ASPIRIN EC 81 MG PO TBEC: 81.0000 mg | DELAYED_RELEASE_TABLET | Freq: Every day | ORAL | Status: DC

## 2012-03-28 MED ORDER — ACETAMINOPHEN 325 MG PO TABS
650.0000 mg | ORAL_TABLET | ORAL | Status: DC | PRN
  Administered 2012-03-28 (×3): 650 mg via ORAL
  Filled 2012-03-28 (×3): qty 2

## 2012-03-28 MED ORDER — SODIUM CHLORIDE 0.9 % IJ SOLN
3.0000 mL | Freq: Two times a day (BID) | INTRAMUSCULAR | Status: DC
  Administered 2012-03-28 (×2): 3 mL via INTRAVENOUS
  Filled 2012-03-28 (×3): qty 3

## 2012-03-28 MED ORDER — INSULIN ASPART 100 UNIT/ML ~~LOC~~ SOLN
0.0000 [IU] | Freq: Three times a day (TID) | SUBCUTANEOUS | Status: DC
  Administered 2012-03-28: 5 [IU] via SUBCUTANEOUS
  Administered 2012-03-29: 2 [IU] via SUBCUTANEOUS

## 2012-03-28 MED ORDER — ENOXAPARIN SODIUM 40 MG/0.4ML ~~LOC~~ SOLN
40.0000 mg | SUBCUTANEOUS | Status: DC
  Administered 2012-03-28 – 2012-03-29 (×2): 40 mg via SUBCUTANEOUS
  Filled 2012-03-28: qty 0.4

## 2012-03-28 MED ORDER — ASPIRIN 81 MG PO CHEW: 324.0000 mg | CHEWABLE_TABLET | ORAL | Status: AC

## 2012-03-28 MED ORDER — SODIUM CHLORIDE 0.9 % IJ SOLN: 3.0000 mL | INTRAMUSCULAR | Status: DC | PRN

## 2012-03-28 MED ORDER — LISINOPRIL 10 MG PO TABS
10.0000 mg | ORAL_TABLET | Freq: Every day | ORAL | Status: DC
  Administered 2012-03-28: 10 mg via ORAL
  Filled 2012-03-28: qty 1

## 2012-03-28 MED ORDER — RANOLAZINE ER 500 MG PO TB12
500.0000 mg | ORAL_TABLET | Freq: Two times a day (BID) | ORAL | Status: DC
  Administered 2012-03-28: 500 mg via ORAL
  Filled 2012-03-28 (×6): qty 1

## 2012-03-28 MED ORDER — PANTOPRAZOLE SODIUM 40 MG PO TBEC
40.0000 mg | DELAYED_RELEASE_TABLET | Freq: Every day | ORAL | Status: DC
  Administered 2012-03-28: 40 mg via ORAL
  Filled 2012-03-28: qty 1

## 2012-03-28 MED ORDER — NITROGLYCERIN 0.4 MG SL SUBL: 0.4000 mg | SUBLINGUAL_TABLET | SUBLINGUAL | Status: DC | PRN

## 2012-03-28 MED ORDER — ONDANSETRON HCL 4 MG/2ML IJ SOLN: 4.0000 mg | Freq: Four times a day (QID) | INTRAMUSCULAR | Status: DC | PRN

## 2012-03-28 NOTE — Progress Notes (Signed)
Angel Dixon, Angel Dixon            ACCOUNT NO.:  [id number]  MEDICAL RECORD NO.:  [id number]  LOCATION:  A325                          FACILITY:  [facility]  PHYSICIAN:  Washington Whedbee G. Renard Matter, MD   DATE OF BIRTH:  Mar 17, 1941  DATE OF PROCEDURE: DATE OF DISCHARGE:                                PROGRESS NOTE   This patient has known coronary artery disease, has been treated medically.  Previous cardiac catheterization showed nonobstructive disease and normal ventricular function.  He developed yesterday intermittent chest pain and palpitations, substernal tightness.  He was admitted by [facility].  He is a diabetic.  He is relatively asymptomatic this a.m.  OBJECTIVE:  VITAL SIGNS:  Blood pressure 187/85, respirations 17, pulse 80, temp 97.8.  Troponin less than 0.3. LUNGS:  Clear to P and A. HEART:  Regular rhythm. ABDOMEN:  No palpable organs or masses.  ASSESSMENT:  The patient has known coronary artery disease.  He has had recent chest pain.  PLAN:  To continue current regimen.  We will obtain Cardiology consult today.     Odaly Peri G. Renard Matter, MD     AGM/MEDQ  D:  03/28/2012  T:  03/28/2012  Job:  [id number]

## 2012-03-28 NOTE — Care Management Note (Signed)
    Page 1 of 1   03/28/2012     1:32:38 PM   CARE MANAGEMENT NOTE 03/28/2012  Patient:  Angel Dixon, Angel Dixon   Account Number:  [id number]  Date Initiated:  03/28/2012  Documentation initiated by:  Sharrie Rothman  Subjective/Objective Assessment:   Pt admitted from home with CP. Pt lives with wife and will return home at discharge. Pt is independent with ADL's.     Action/Plan:   No CM o rHH needs noted.   Anticipated DC Date:  03/29/2012   Anticipated DC Plan:  HOME/SELF CARE      DC Planning Services  CM consult      Choice offered to / List presented to:             Status of service:  Completed, signed off Medicare Important Message given?   (If response is "NO", the following Medicare IM given date fields will be blank) Date Medicare IM given:   Date Additional Medicare IM given:    Discharge Disposition:  HOME/SELF CARE  Per UR Regulation:    If discussed at Long Length of Stay Meetings, dates discussed:    Comments:  03/28/12 1331 Arlyss Queen, RN BSN CM

## 2012-03-28 NOTE — Consult Note (Signed)
oo

## 2012-03-28 NOTE — Consult Note (Signed)
Reason for Consult: Chest pain in patient with known coronary disease  Referring Physician: Dr. Orpha Bur Angel Dixon is an 71 y.o. male. Afro-American man with 4 children (3 living) 3 grandchildren and 3 great-grandchildren. He lives with his wife.         Chief Complaint: Chest pain intermittent of 2 weeks  HPI: Angel Dixon is a very pleasant 71 year old Afro-American great-grandfather who was retired from the lower large [employer] for over 20 years. He stopped smoking over 25 years ago. He has a history of systemic hypertension mild left renal artery stenosis on prior diagnostic catheterization noncritical coronary disease on 2 prior catheterizations. The last of which was 05/31/2011. This was done for chest pain without myocardial infarction.  The patient suffered a small [facility] MRI in November of 2009. He underwent catheterization by me which demonstrated 30% narrowing of the proximal right irregularity of the proximal LAD 40% narrowing in the mid LAD and 100% occlusion of an inferior bifurcation branch of the first diagonal. He had normal LV for and 30% L. RAS which was felt to be minor with minor anterolateral wall motion abnormality probably compatible with the diagonal side branch occlusion. He h as monitor with no other significant arrhythmia. Outpatient medications include glipizide 10 metformin 501 g a.m. 500 p.m. one baby aspirin Toprol 12.5 twice a day Uroxatral salt Protonix 40 atorvastatin 40.  The associated hypertension and AODM both of which been under good control and as mentioned he hasn't smoked in over 25 years.  e denies any significant reflux symptoms he has had no syncope or presyncope or palpitations  Is occasional recatheterization in December of 2012 showing no significant LAD disease, 40-50% mid circumflex disease, old occlusion of the inferior diagonal bifurcation branch and 40% right. Medical therapy was recommended. He did well up until a week prior to admission.  He was seen by Dr. Rennis Golden in July of 2013.  Over the week prior to admission he has had intermittent substernal pressure without radiation and a full feeling in his chest. He says this often comes on after eating and then trying to walk. He says is also a come on at rest although he's had no nocturnal episodes. The episodes became more frequent over the last week some persistent discomfort. He presented to the emergency room and pain was relieved in the ER the in no acute EKG changes and thus far cardiac enzymes including troponins have been negative and there've been no EKG changes to suggest MI with no significant ST-T wave abnormalities were diagnostic Q waves per he has been pain-free in the hospital. Blood pressure renal function and rhythm have been stable in the hospital thus far.    Past Medical History  Diagnosis Date  . Hypertension   . Diabetes mellitus   . PUD (peptic ulcer disease)     endo 03/15/10  . 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 Dyslipidemia   . Dysrhythmia     "palpitation"  . Angina   . GERD (gastroesophageal reflux disease)     Past Surgical History  Procedure Date  . Hernia repair   . Back surgery 2005  . Lumbar laminectomy 09/2003    L2-3, 3-4, 4-5  . Cardiac catheterization ~ 2010  . Inguinal hernia repair 1982    right    Family History  Problem Relation Age of Onset  . Coronary artery disease Father     died 55  . Kidney failure  Mother     died 46  . Sarcoidosis Daughter     died 36   Social History:  reports that he has quit smoking. His smoking use included Cigarettes. He has a 25 pack-year smoking history. He does not have any smokeless tobacco history on file. He reports that he drinks alcohol. He reports that he does not use illicit drugs.  Allergies:  Allergies  Allergen Reactions  . Sulfa Antibiotics Hives  . Vioxx (Rofecoxib) Hives  . Yellow Jacket Venom (Bee Venom) Swelling    Medications Prior to Admission    Medication Sig Dispense Refill  . aspirin EC 81 MG tablet Take 81 mg by mouth daily.        Marland Kitchen atorvastatin (LIPITOR) 40 MG tablet Take 40 mg by mouth daily.        . Bisacodyl (LAXATIVE PO) Take 1 tablet by mouth as needed. For constipation      . glipiZIDE (GLUCOTROL XL) 2.5 MG 24 hr tablet Take 2.5 mg by mouth 2 (two) times daily.      Marland Kitchen lisinopril (PRINIVIL,ZESTRIL) 10 MG tablet Take 1 tablet (10 mg total) by mouth daily.  30 tablet  5  . metFORMIN (GLUCOPHAGE) 500 MG tablet Take 500-1,000 mg by mouth 2 (two) times daily. Take two tablets in the morning and one tablet at bedtime      . metoprolol tartrate (LOPRESSOR) 25 MG tablet Take 25-37.5 mg by mouth 2 (two) times daily. Take one & one-half tablet in the morning (37.5mg  total) and one tablet in the evening      . pantoprazole (PROTONIX) 40 MG tablet Take 40 mg by mouth daily.        . sodium chloride (OCEAN) 0.65 % nasal spray Place 1 spray into the nose as needed. sinus       . tetrahydrozoline-zinc (VISINE-AC) 0.05-0.25 % ophthalmic solution Place 2 drops into both eyes daily as needed. For dry eye relief      . nitroGLYCERIN (NITROSTAT) 0.4 MG SL tablet Place 1 tablet (0.4 mg total) under the tongue every 5 (five) minutes as needed for chest pain (severe chest pain or pressure only).  25 tablet  2    Results for orders placed during the hospital encounter of 03/27/12 (from the past 48 hour(s))  CBC WITH DIFFERENTIAL     Status: Abnormal   Collection Time   03/27/12 10:37 PM      Component Value Range Comment   WBC 6.9  4.0 - 10.5 K/uL    RBC 4.53  4.22 - 5.81 MIL/uL    Hemoglobin 12.8 (*) 13.0 - 17.0 g/dL    HCT 11.9 (*) 14.7 - 52.0 %    MCV 84.8  78.0 - 100.0 fL    MCH 28.3  26.0 - 34.0 pg    MCHC 33.3  30.0 - 36.0 g/dL    RDW 82.9  56.2 - 13.0 %    Platelets 344  150 - 400 K/uL    Neutrophils Relative 57  43 - 77 %    Neutro Abs 3.9  1.7 - 7.7 K/uL    Lymphocytes Relative 30  12 - 46 %    Lymphs Abs 2.0  0.7 - 4.0 K/uL     Monocytes Relative 9  3 - 12 %    Monocytes Absolute 0.6  0.1 - 1.0 K/uL    Eosinophils Relative 4  0 - 5 %    Eosinophils Absolute 0.3  0.0 - 0.7 K/uL  Basophils Relative 1  0 - 1 %    Basophils Absolute 0.0  0.0 - 0.1 K/uL   COMPREHENSIVE METABOLIC PANEL     Status: Abnormal   Collection Time   03/27/12 10:37 PM      Component Value Range Comment   Sodium 138  135 - 145 mEq/L    Potassium 3.9  3.5 - 5.1 mEq/L    Chloride 97  96 - 112 mEq/L    CO2 28  19 - 32 mEq/L    Glucose, Bld 96  70 - 99 mg/dL    BUN 17  6 - 23 mg/dL    Creatinine, Ser 7.82  0.50 - 1.35 mg/dL    Calcium 9.7  8.4 - 95.6 mg/dL    Total Protein 7.9  6.0 - 8.3 g/dL    Albumin 4.1  3.5 - 5.2 g/dL    AST 19  0 - 37 U/L    ALT 19  0 - 53 U/L    Alkaline Phosphatase 108  39 - 117 U/L    Total Bilirubin 0.6  0.3 - 1.2 mg/dL    GFR calc non Af Amer 74 (*) >90 mL/min    GFR calc Af Amer 85 (*) >90 mL/min   TROPONIN I     Status: Normal   Collection Time   03/27/12 10:37 PM      Component Value Range Comment   Troponin I <0.30  <0.30 ng/mL   APTT     Status: Abnormal   Collection Time   03/27/12 10:37 PM      Component Value Range Comment   aPTT 38 (*) 24 - 37 seconds   PROTIME-INR     Status: Normal   Collection Time   03/27/12 10:37 PM      Component Value Range Comment   Prothrombin Time 13.1  11.6 - 15.2 seconds    INR 1.00  0.00 - 1.49   PRO B NATRIURETIC PEPTIDE     Status: Normal   Collection Time   03/27/12 10:37 PM      Component Value Range Comment   Pro B Natriuretic peptide (BNP) 14.8  0 - 125 pg/mL   TROPONIN I     Status: Normal   Collection Time   03/28/12  2:41 AM      Component Value Range Comment   Troponin I <0.30  <0.30 ng/mL   BASIC METABOLIC PANEL     Status: Abnormal   Collection Time   03/28/12  2:43 AM      Component Value Range Comment   Sodium 137  135 - 145 mEq/L    Potassium 4.1  3.5 - 5.1 mEq/L    Chloride 98  96 - 112 mEq/L    CO2 27  19 - 32 mEq/L    Glucose,  Bld 102 (*) 70 - 99 mg/dL    BUN 19  6 - 23 mg/dL    Creatinine, Ser 2.13  0.50 - 1.35 mg/dL    Calcium 9.4  8.4 - 08.6 mg/dL    GFR calc non Af Amer 69 (*) >90 mL/min    GFR calc Af Amer 80 (*) >90 mL/min   CBC     Status: Abnormal   Collection Time   03/28/12  2:43 AM      Component Value Range Comment   WBC 6.7  4.0 - 10.5 K/uL    RBC 4.17 (*) 4.22 - 5.81 MIL/uL    Hemoglobin 12.0 (*)  13.0 - 17.0 g/dL    HCT 16.1 (*) 09.6 - 52.0 %    MCV 84.2  78.0 - 100.0 fL    MCH 28.8  26.0 - 34.0 pg    MCHC 34.2  30.0 - 36.0 g/dL    RDW 04.5  40.9 - 81.1 %    Platelets 315  150 - 400 K/uL   LIPID PANEL     Status: Abnormal   Collection Time   03/28/12  2:49 AM      Component Value Range Comment   Cholesterol 121  0 - 200 mg/dL    Triglycerides 914 (*) <150 mg/dL    HDL 30 (*) >78 mg/dL    Total CHOL/HDL Ratio 4.0      VLDL 44 (*) 0 - 40 mg/dL    LDL Cholesterol 47  0 - 99 mg/dL   MRSA PCR SCREENING     Status: Normal   Collection Time   03/28/12  3:30 AM      Component Value Range Comment   MRSA by PCR NEGATIVE  NEGATIVE   GLUCOSE, CAPILLARY     Status: Abnormal   Collection Time   03/28/12  7:42 AM      Component Value Range Comment   Glucose-Capillary 118 (*) 70 - 99 mg/dL    Comment 1 Documented in Chart      Comment 2 Notify RN     TROPONIN I     Status: Normal   Collection Time   03/28/12  8:37 AM      Component Value Range Comment   Troponin I <0.30  <0.30 ng/mL   GLUCOSE, CAPILLARY     Status: Abnormal   Collection Time   03/28/12 11:08 AM      Component Value Range Comment   Glucose-Capillary 224 (*) 70 - 99 mg/dL    Comment 1 Documented in Chart      Comment 2 Notify RN      Dg Chest Portable 1 View  03/27/2012  *RADIOLOGY REPORT*  Clinical Data: Mid chest pain.  PORTABLE CHEST - 1 VIEW  Comparison: 05/11/2011  Findings: Lungs clear bilaterally. Heart and mediastinum are within normal limits.  The trachea is midline.  Negative for a pneumothorax.  IMPRESSION: No  acute chest findings.   Original Report Authenticated By: Richarda Overlie, M.D.     ROS: Review of systems other than outlined above and in chart is unremarkable and not helpful. Blood pressure 187/85, pulse 80, temperature 97.8 F (36.6 C), temperature source Oral, resp. rate 17, height 5\' 8"  (1.727 m), weight 73.483 kg (162 lb), SpO2 98.00%. PE on examination he is quite healthy appearing comfortable 10-20 skin is warm and dry he is afebrile blood pressure is 125/75. No significant carotid bruits thyroid is not enlarged and his chest shows mildly diminished breath sounds but no wheezes rubs or rhonchi. The PMI is in the left fifth ICS in the [facility] felt was localized heave there is an S4 no S3 no parasternal lift and is a benign sounding 1-2/6 systolic murmur at the left sternal border no diastolic murmur or rubs. There is no JVD Hg reflux or edema. Liver spleen and kidney not palpable abdomen nontender bowel sounds normal femorals intact DP and PT intact. Reflexes are normal with no pathologic reflexes.  Assessment/Plan Mr. Cato has recurrent chest pain. This is similar to his last admission in December of 2012 when he had catheterization which showed no significant changes coronary disease except of  40-50% mid circumflex lesion which was new his symptoms do sound compatible with ischemia although his enzymes are negative and there is no evidence of myocardial infarction. I would recommend further evaluation and we can do this as an outpatient with day outpatient nuclear stress test. He is relatively low risk at present based on his pain-free status negative enzymes and no EKG changes.  I would recommend adding Ranexa to his regimen and some of this chest pain may ischemic be ischemic and I would continue high-dose PPI take some of the symptoms are upper GI related. We'll request that you make an appointment with him to follow up with Dr. Rennis Golden post hospital and we will take care of scheduling his stress  test. During further questions or problems please let us know  [facility], Hera Celaya A 03/28/2012, 12:03 PM

## 2012-03-28 NOTE — Progress Notes (Signed)
*  PRELIMINARY RESULTS* Echocardiogram 2D Echocardiogram has been performed.  Angel Dixon 03/28/2012, 11:18 AM

## 2012-03-28 NOTE — Progress Notes (Signed)
Pt continues to c/o slight chest pressure that is much relieved since admission, and has gotten better throughout the day. Also, pts headache has subsided since nitroglycerin patch was removed earlier today, and pt given Tylenol. [name]

## 2012-03-28 NOTE — H&P (Signed)
[facility] History and Physical  Angel Dixon WUJ:[id number] DOB: 1941-05-04 DOA: 03/27/2012  Referring physician: Marylyn Ishihara, MD PCP: Milana Obey, MD  Specialists: [facility]  Chief Complaint: Chest Pain  HPI: Angel Dixon is a 71 y.o. male with known Coronary Artery Disease s/p small NSTEMI in 2009 (treated medically) and a prior chest pain admission 05/2011 with a cardiac cath which showed non-obstructing disease and normal LV function came in this evening to the [facility] c/o 2 weeks of intermittent chest pain and new onset feeling of palpations that began around 1PM and chest pain that lasted for about 2 hours, brought on by exertion. Pain was 7/10, substernal, non-radiating, similar to prior cardiac pain, associated with diaphoresis, denies dyspnea- pain resolved completely in ED at time of admission. Denies reflux symptoms or chest wall muscle strain/tenderness. Hospitalist service asked to admit for ACS rule out. At the time of admission CEs were negative and EKG is unchanged from prior with no signs of ischemia.  Review of Systems: Review of Systems  Constitutional: Positive for diaphoresis.  HENT: Negative.   Eyes: Negative.   Respiratory: Negative.   Cardiovascular: Positive for chest pain and palpitations.  Gastrointestinal: Positive for heartburn.  Genitourinary: Negative.   Musculoskeletal: Negative.   Skin: Negative.   Neurological: Negative.   Endo/Heme/Allergies: Negative.   Psychiatric/Behavioral: Negative.   All other systems reviewed and are negative.     Past Medical History  Diagnosis Date  . Hypertension   . Diabetes mellitus   . PUD (peptic ulcer disease)     endo 03/15/10  . 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 Dyslipidemia   . Dysrhythmia     "palpitation"  . Angina   . GERD (gastroesophageal reflux disease)    Past Surgical History  Procedure Date  . Hernia repair   . Back surgery 2005    . Lumbar laminectomy 09/2003    L2-3, 3-4, 4-5  . Cardiac catheterization ~ 2010  . Inguinal hernia repair 1982    right   Social History:  reports that he has quit smoking. His smoking use included Cigarettes. He has a 25 pack-year smoking history. He does not have any smokeless tobacco history on file. He reports that he drinks alcohol. He reports that he does not use illicit drugs.   Allergies  Allergen Reactions  . Sulfa Antibiotics Hives  . Vioxx (Rofecoxib) Hives  . Yellow Jacket Venom (Bee Venom) Swelling    Family History  Problem Relation Age of Onset  . Coronary artery disease Father     died 23  . Kidney failure Mother     died 64  . Sarcoidosis Daughter     died 56    Prior to Admission medications   Medication Sig Start Date End Date Taking? Authorizing Provider  aspirin EC 81 MG tablet Take 81 mg by mouth daily.     Yes Historical Provider, MD  atorvastatin (LIPITOR) 40 MG tablet Take 40 mg by mouth daily.     Yes Historical Provider, MD  Bisacodyl (LAXATIVE PO) Take 1 tablet by mouth as needed. For constipation   Yes Historical Provider, MD  glipiZIDE (GLUCOTROL XL) 2.5 MG 24 hr tablet Take 2.5 mg by mouth 2 (two) times daily.   Yes Historical Provider, MD  lisinopril (PRINIVIL,ZESTRIL) 10 MG tablet Take 1 tablet (10 mg total) by mouth daily. 05/14/11 05/13/12 Yes Abelino Derrick, PA  metFORMIN (GLUCOPHAGE) 500 MG tablet  Take 500-1,000 mg by mouth 2 (two) times daily. Take two tablets in the morning and one tablet at bedtime 05/17/11  Yes Abelino Derrick, PA  metoprolol tartrate (LOPRESSOR) 25 MG tablet Take 25-37.5 mg by mouth 2 (two) times daily. Take one & one-half tablet in the morning (37.5mg  total) and one tablet in the evening   Yes Historical Provider, MD  pantoprazole (PROTONIX) 40 MG tablet Take 40 mg by mouth daily.     Yes Historical Provider, MD  sodium chloride (OCEAN) 0.65 % nasal spray Place 1 spray into the nose as needed. sinus    Yes Historical  Provider, MD  tetrahydrozoline-zinc (VISINE-AC) 0.05-0.25 % ophthalmic solution Place 2 drops into both eyes daily as needed. For dry eye relief   Yes Historical Provider, MD  nitroGLYCERIN (NITROSTAT) 0.4 MG SL tablet Place 1 tablet (0.4 mg total) under the tongue every 5 (five) minutes as needed for chest pain (severe chest pain or pressure only). 05/14/11 05/13/12  Abelino Derrick, PA   Physical Exam: Filed Vitals:   03/28/12 0000 03/28/12 0100 03/28/12 0148 03/28/12 0200  BP:  127/74 123/74 121/70  Pulse: 73 76 73 75  Temp:      TempSrc:      Resp:   20   Height:      Weight:      SpO2: 97% 97% 97% 97%     General:  NAD, cooperative and pleasant  Eyes: normal appearance  ENT: normal, MMM  Neck: supple  Cardiovascular: RRR, no mrg, ext pulses 2+, no edema  Respiratory: CTAB  Abdomen: S, NT, active BS  Skin: no lesions  Musculoskeletal: normal, moves all 4 symetrically  Psychiatric: normal affect and mood  Neurologic: grossly non-focal  Labs on Admission:  Basic Metabolic Panel:  Lab 03/27/12 4098  NA 138  K 3.9  CL 97  CO2 28  GLUCOSE 96  BUN 17  CREATININE 1.00  CALCIUM 9.7  MG --  PHOS --   Liver Function Tests:  Lab 03/27/12 2237  AST 19  ALT 19  ALKPHOS 108  BILITOT 0.6  PROT 7.9  ALBUMIN 4.1   No results found for this basename: LIPASE:5,AMYLASE:5 in the last 168 hours No results found for this basename: AMMONIA:5 in the last 168 hours CBC:  Lab 03/27/12 2237  WBC 6.9  NEUTROABS 3.9  HGB 12.8*  HCT 38.4*  MCV 84.8  PLT 344   Cardiac Enzymes:  Lab 03/27/12 2237  CKTOTAL --  CKMB --  CKMBINDEX --  TROPONINI <0.30    BNP (last 3 results)  Basename 03/27/12 2237  PROBNP 14.8   CBG: No results found for this basename: GLUCAP:5 in the last 168 hours  Radiological Exams on Admission: Dg Chest Portable 1 View  03/27/2012  *RADIOLOGY REPORT*  Clinical Data: Mid chest pain.  PORTABLE CHEST - 1 VIEW  Comparison: 05/11/2011   Findings: Lungs clear bilaterally. Heart and mediastinum are within normal limits.  The trachea is midline.  Negative for a pneumothorax.  IMPRESSION: No acute chest findings.   Original Report Authenticated By: Richarda Overlie, M.D.     EKG: Independently reviewed. Sinus arrythmia.  Assessment/Plan  1. Chest Pain, with known CAD. High risk for ACS but on optimal medication regimen-last cath in 05/2011. Followed by [facility]. Chest pain resolved in ED.   Cycle CEs, monitor on Tele, repeat 12-lead in AM  Cardiology Consultation for further imaging/diagnostics-coronary evaluation  Resumed home Metoprolol, ASA, Statin, and ACE-I  2D echo to eval LVF  2.   DM2, stable controlled   Check A1C  Started on SSI, held oral medications in case he needs contrasted study or is NPO  Code Status:Full Code Family Communication: Discussed plan with patient at bedside Disposition Plan:  Home when medically stable, anticipate LOS 1-2 days.  Time spent: 50 minutes  [facility] Pager [phone]  If 7PM-7AM, please contact night-coverage [email or web address] Password [email or web address] 03/28/2012, 3:03 AM

## 2012-03-28 NOTE — Progress Notes (Signed)
UR Chart Review Completed  

## 2012-03-29 LAB — GLUCOSE, CAPILLARY

## 2012-03-29 MED ORDER — RANOLAZINE ER 500 MG PO TB12: 500.0000 mg | ORAL_TABLET | Freq: Two times a day (BID) | ORAL | Status: DC

## 2012-03-29 NOTE — Discharge Summary (Signed)
[name], Angel Dixon            ACCOUNT NO.:  [id number]  MEDICAL RECORD NO.:  [id number]  LOCATION:  A325                          FACILITY:  [facility]  PHYSICIAN:  Stran Raper G. Renard Matter, MD   DATE OF BIRTH:  11-24-1940  DATE OF ADMISSION:  03/27/2012 DATE OF DISCHARGE:  10/26/2013LH                              DISCHARGE SUMMARY   A 71 year old patient of Dr. Sudie Bailey admitted March 27, 2012, discharged March 29, 2012, 2 days hospitalization.  DIAGNOSES: 1. Coronary artery disease noncritical. 2. Diabetes mellitus type 2. 3. Hypertension systemic. 4. Mild left renal artery stenosis. 5. Dyslipidemia.  CONDITION:  Stable and improved at the time of his discharge.  This patient was admitted with intermittent chest pain for approximately 2 weeks duration.  Apparently had a cardiac catheterization in 2009 which showed 30% narrowing of the proximal right coronary and proximal LAD 40% narrowing.  He had intermittent substernal pressure which became more frequent.  He was seen in emergency room.  No acute EKG changes were seen.  Cardiac enzymes and troponins were negative.  He was placed in for evaluation to rule out acute process.  He denied any reflux symptoms.  PHYSICAL EXAMINATION:  VITAL SIGNS:  On admission, BP 127/74, pulse 73, temp 98. HEENT:  Eyes, PERRLA.  TMs negative.  Oropharynx benign. NECK:  Supple.  No JVD or thyroid abnormalities. HEART:  Regular rhythm.  No murmurs. LUNGS:  Clear to P and A. ABDOMEN:  No palpable organs or masses. SKIN:  Warm and dry. NEUROLOGIC:  No focal deficit.  LABORATORY DATA:  Chemistries on admission; sodium 138, potassium 3.9, chloride 97, CO2 28, glucose 96, BUN 17, creatinine 1.0, calcium 9.7. AST 19, ALT 19, alkaline phosphatase 108.  Bilirubin 0.6, protein 7.9, albumin 4.1.  CBC; WBC 6900 with hemoglobin 12.8, hematocrit 38.4. Cardiac markers, troponin less than 0.3.  ProBNP 14.8.  X-rays; chest x- ray, no acute findings.  EKG,  sinus arrhythmia.  HOSPITAL COURSE:  The patient was placed on telemetry.  He was given aspirin 81 mg daily, atorvastatin 40 mg daily, Lovenox 40 mg subcutaneously every 24 hours, aspartate insulin 5 units t.i.d., lisinopril 10 mg daily, metoprolol tartrate 25 mg b.i.d., pantoprazole 40 mg daily, IV fluids, sodium chloride every 12 hours.  He was seen on consultation by Dr. Alanda Amass of [facility], placed on Ranexa 500 mg b.i.d. and also ordered for him to have p.r.n. nitroglycerin 0.4 mg as needed.  The patient remained stable while in the hospital, was seen in consultation by Dr. Alanda Amass.  He felt further evaluation could be done as an outpatient with outpatient nuclear stress test.  He felt that he was at low risk.  based on the pain free and had negative enzymes and no EKG changes.  The patient was discharged in stable condition.     [name] G. Renard Matter, MD     AGM/MEDQ  D:  03/28/2012  T:  03/29/2012  Job:  [id number]

## 2012-03-29 NOTE — Discharge Summary (Signed)
Angel Dixon, Angel Dixon            ACCOUNT NO.:  [id number]  MEDICAL RECORD NO.:  [id number]  LOCATION:  A325                          FACILITY:  [facility]  PHYSICIAN:  Inaara Tye G. Renard Matter, MD   DATE OF BIRTH:  [birth year]  DATE OF ADMISSION:  03/27/2012 DATE OF DISCHARGE:  10/26/2013LH                              DISCHARGE SUMMARY   ADDENDUM:  The patient will be discharged on the following medications. 1. Protonix 40 mg daily. 2. Atorvastatin 40 mg daily. 3. Aspirin 81 mg daily. 4. Prinivil 10 mg daily. 5. P.r.n. nitroglycerin 0.4 mg. 6. Glipizide 2.5 mg daily. 7. Metoprolol 25 mg daily. 8. Visine 2 drops in both eyes as needed daily. 9. Bisacodyl laxative 1 daily as needed. 10.Metformin 500 mg b.i.d.  The patient was stable at the time of his discharge.     Myrakle Wingler G. Renard Matter, MD     AGM/MEDQ  D:  03/29/2012  T:  03/29/2012  Job:  [id number]

## 2012-03-29 NOTE — Progress Notes (Addendum)
Pt given d/c instructions, prescriptions and follow up appt information. Pt and wife have no questions at this time. IV and telemetry d/c. [name] [phone]: pt and wife d/c via wheelchair by 2 NTs. [name]

## 2012-03-29 NOTE — Progress Notes (Signed)
NAMERAYMEL, CULL            ACCOUNT NO.:  [id number]  MEDICAL RECORD NO.:  [id number]  LOCATION:  A325                          FACILITY:  [facility]  PHYSICIAN:  Roselyn Doby G. Renard Matter, MD   DATE OF BIRTH:  1940-11-28  DATE OF PROCEDURE: DATE OF DISCHARGE:  03/29/2012                                PROGRESS NOTE   ADDENDUM:  Additional medication prescribed was Ranexa 500 mg 1 b.i.d.     Milagros Middendorf G. Renard Matter, MD     AGM/MEDQ  D:  03/29/2012  T:  03/29/2012  Job:  [id number]

## 2012-03-29 NOTE — Plan of Care (Signed)
Problem: Phase II Progression Outcomes Goal: Cath/PCI Day Path if indicated Outcome: Adequate for Discharge Recent cath. Cardiology aware

## 2012-12-15 ENCOUNTER — Ambulatory Visit: Payer: Medicare Other | Admitting: Internal Medicine

## 2012-12-16 ENCOUNTER — Ambulatory Visit (INDEPENDENT_AMBULATORY_CARE_PROVIDER_SITE_OTHER): Payer: Medicare Other | Admitting: Internal Medicine

## 2012-12-16 ENCOUNTER — Encounter: Payer: Self-pay | Admitting: Internal Medicine

## 2012-12-16 VITALS — BP 142/86 | HR 69 | Ht 68.0 in | Wt 158.8 lb

## 2012-12-16 DIAGNOSIS — I2510 Atherosclerotic heart disease of native coronary artery without angina pectoris: Secondary | ICD-10-CM

## 2012-12-16 DIAGNOSIS — I4949 Other premature depolarization: Secondary | ICD-10-CM

## 2012-12-16 DIAGNOSIS — I10 Essential (primary) hypertension: Secondary | ICD-10-CM

## 2012-12-16 DIAGNOSIS — E785 Hyperlipidemia, unspecified: Secondary | ICD-10-CM

## 2012-12-16 DIAGNOSIS — I493 Ventricular premature depolarization: Secondary | ICD-10-CM

## 2012-12-16 DIAGNOSIS — R002 Palpitations: Secondary | ICD-10-CM

## 2012-12-16 NOTE — Patient Instructions (Addendum)
Your physician wants you to follow-up in: 1 year. You will receive a reminder letter in the mail two months in advance. If you don't receive a letter, please call our office to schedule the follow-up appointment.  

## 2012-12-16 NOTE — Progress Notes (Signed)
OFFICE NOTE  Chief Complaint:  Routine office visit  Primary Care Physician: Milana Obey, MD  HPI:  Angel Dixon is a 72 year old gentleman with history of moderate coronary disease by catheterization in December 2012, 40% to 50% mid circumflex stenosis at the time, 60% OM-1 stenosis, and 40% in the PDA. He was started on a beta blocker and his symptoms improved; however, he was having more substernal chest pressure, and Dr. Alanda Amass saw him and recommended starting Ranexa. He had marked improvement in his symptoms and was interested in taking the medicine, but has since discontinued it for a feeling that it caused him some side effects. He was also feeling that the beta blocker caused him side effects and decreased the dose to 12.5 mg at night. Overall, he seems to be doing fairly well. His EKG today demonstrates no ectopy. He denies any palpitations. He does get some mild shortness of breath walking up stairs, but is not particularly active and wishes to start exercising again at the [facility].  PMHx:  Past Medical History  Diagnosis Date  . Hypertension   . Diabetes mellitus   . PUD (peptic ulcer disease)     endo 03/15/10  . 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 Dyslipidemia   . Dysrhythmia     "palpitation"  . Angina   . GERD (gastroesophageal reflux disease)     Past Surgical History  Procedure Laterality Date  . Hernia repair    . Back surgery  2005  . Lumbar laminectomy  09/2003    L2-3, 3-4, 4-5  . Cardiac catheterization  ~ 2010  . Inguinal hernia repair  1982    right    FAMHx:  Family History  Problem Relation Age of Onset  . Coronary artery disease Father     died 72  . Kidney failure Mother     died 64  . Sarcoidosis Daughter     died 94    SOCHx:   reports that he has quit smoking. His smoking use included Cigarettes. He has a 25 pack-year smoking history. He does not have any smokeless tobacco history on file. He reports  that  drinks alcohol. He reports that he does not use illicit drugs.  ALLERGIES:  Allergies  Allergen Reactions  . Sulfa Antibiotics Hives  . Vioxx (Rofecoxib) Hives  . Yellow Jacket Venom (Bee Venom) Swelling    ROS: A comprehensive review of systems was negative except for: Respiratory: positive for dyspnea on exertion Cardiovascular: positive for palpitations  HOME MEDS: Current Outpatient Prescriptions  Medication Sig Dispense Refill  . alfuzosin (UROXATRAL) 10 MG 24 hr tablet Take 10 mg by mouth daily as needed.      Marland Kitchen aspirin EC 81 MG tablet Take 81 mg by mouth daily.        Marland Kitchen atorvastatin (LIPITOR) 40 MG tablet Take 40 mg by mouth daily.        . Cyanocobalamin (VITAMIN B 12 PO) Take 1 tablet by mouth daily.      Tery Sanfilippo Calcium (STOOL SOFTENER PO) Take 1 tablet by mouth daily as needed.      . ferrous sulfate 325 (65 FE) MG tablet Take 325 mg by mouth daily with breakfast.      . fluticasone (FLONASE) 50 MCG/ACT nasal spray Place 1 spray into the nose as needed.      Marland Kitchen glipiZIDE (GLUCOTROL) 10 MG tablet Take 10 mg by mouth 2 (two) times daily  before a meal.      . lisinopril (PRINIVIL,ZESTRIL) 2.5 MG tablet Take 2.5 mg by mouth daily.      . metFORMIN (GLUCOPHAGE) 500 MG tablet Take 500 mg by mouth 2 (two) times daily. Take two tablets in the morning and one tablet at bedtime      . metoprolol tartrate (LOPRESSOR) 25 MG tablet Take 25 mg by mouth 2 (two) times daily. Take one & one-half tablet in the morning (37.5mg  total) and one tablet in the evening      . pantoprazole (PROTONIX) 40 MG tablet Take 40 mg by mouth daily.        Marland Kitchen tetrahydrozoline-zinc (VISINE-AC) 0.05-0.25 % ophthalmic solution Place 2 drops into both eyes daily as needed. For dry eye relief      . nitroGLYCERIN (NITROSTAT) 0.4 MG SL tablet Place 1 tablet (0.4 mg total) under the tongue every 5 (five) minutes as needed for chest pain (severe chest pain or pressure only).  25 tablet  2   No current  facility-administered medications for this visit.    LABS/IMAGING: No results found for this or any previous visit (from the past 48 hour(s)). No results found.  VITALS: BP 142/86  Pulse 69  Ht 5\' 8"  (1.727 m)  Wt 158 lb 12.8 oz (72.031 kg)  BMI 24.15 kg/m2  EXAM: General appearance: alert and no distress Neck: no adenopathy, no carotid bruit, no JVD, supple, symmetrical, trachea midline and thyroid not enlarged, symmetric, no tenderness/mass/nodules Lungs: clear to auscultation bilaterally Heart: regular rate and rhythm, S1, S2 normal, no murmur, click, rub or gallop Abdomen: soft, non-tender; bowel sounds normal; no masses,  no organomegaly Extremities: extremities normal, atraumatic, no cyanosis or edema Pulses: 2+ and symmetric Skin: Skin color, texture, turgor normal. No rashes or lesions Neurologic: Grossly normal  EKG: Normal sinus rhythm at 69  ASSESSMENT: 1. Mild to moderate coronary disease by cath in 2012 2. 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 Hypertension 4. Dyslipidemia 5. Diabetes type 2  PLAN: 1.   [name] is doing well without new anginal complaints. Does get some mild shortness of breath walking up stairs but is not limiting to his exertion. He wishes to start doing more exercise which I've encouraged. He is on appropriate medicines currently. He recently had laboratory work from his primary care provider which shows his cholesterol to be well controlled. His morning blood sugars are in the 90-100 range. Blood pressure is top normal but much better controlled at home. Overall think he is doing well I would not recommend any changes in his medicines at this time. We'll plan to see him back annually or sooner as necessary.  Chrystie Nose, MD, [facility] Attending Cardiologist [facility]  HILTY,Kenneth C 12/16/2012, 8:21 AM

## 2013-07-21 ENCOUNTER — Other Ambulatory Visit (HOSPITAL_COMMUNITY): Payer: Self-pay | Admitting: Family Medicine

## 2013-07-21 ENCOUNTER — Ambulatory Visit (HOSPITAL_COMMUNITY)
Admission: RE | Admit: 2013-07-21 | Discharge: 2013-07-21 | Disposition: A | Discharge status: Home / Self Care | Payer: Medicare Other | Source: Ambulatory Visit | Attending: Family Medicine | Admitting: Family Medicine

## 2013-07-21 DIAGNOSIS — M549 Dorsalgia, unspecified: Secondary | ICD-10-CM

## 2013-07-21 DIAGNOSIS — M545 Low back pain, unspecified: Secondary | ICD-10-CM | POA: Insufficient documentation

## 2013-07-21 DIAGNOSIS — M47817 Spondylosis without myelopathy or radiculopathy, lumbosacral region: Secondary | ICD-10-CM | POA: Insufficient documentation

## 2013-07-21 DIAGNOSIS — M5137 Other intervertebral disc degeneration, lumbosacral region: Secondary | ICD-10-CM | POA: Insufficient documentation

## 2013-07-21 DIAGNOSIS — M51379 Other intervertebral disc degeneration, lumbosacral region without mention of lumbar back pain or lower extremity pain: Secondary | ICD-10-CM | POA: Insufficient documentation

## 2014-02-24 ENCOUNTER — Encounter: Payer: Self-pay | Admitting: *Deleted

## 2014-02-25 ENCOUNTER — Encounter: Payer: Self-pay | Admitting: Internal Medicine

## 2014-02-25 ENCOUNTER — Ambulatory Visit (INDEPENDENT_AMBULATORY_CARE_PROVIDER_SITE_OTHER): Payer: Medicare Other | Admitting: Internal Medicine

## 2014-02-25 VITALS — BP 160/90 | HR 79 | Ht 68.0 in | Wt 154.9 lb

## 2014-02-25 DIAGNOSIS — R55 Syncope and collapse: Secondary | ICD-10-CM

## 2014-02-25 DIAGNOSIS — Z8249 Family history of ischemic heart disease and other diseases of the circulatory system: Secondary | ICD-10-CM

## 2014-02-25 DIAGNOSIS — R42 Dizziness and giddiness: Secondary | ICD-10-CM

## 2014-02-25 DIAGNOSIS — I2510 Atherosclerotic heart disease of native coronary artery without angina pectoris: Secondary | ICD-10-CM

## 2014-02-25 DIAGNOSIS — I493 Ventricular premature depolarization: Secondary | ICD-10-CM

## 2014-02-25 DIAGNOSIS — R002 Palpitations: Secondary | ICD-10-CM

## 2014-02-25 DIAGNOSIS — I4949 Other premature depolarization: Secondary | ICD-10-CM

## 2014-02-25 MED ORDER — METOPROLOL TARTRATE 25 MG PO TABS: 37.5000 mg | ORAL_TABLET | Freq: Two times a day (BID) | ORAL | Status: DC

## 2014-02-25 NOTE — Patient Instructions (Signed)
Your physician has recommended that you wear an event monitor. Event monitors are medical devices that record the heart's electrical activity. Doctors most often [place] these monitors to diagnose arrhythmias. Arrhythmias are problems with the speed or rhythm of the heartbeat. The monitor is a small, portable device. You can wear one while you do your normal daily activities. This is usually used to diagnose what is causing palpitations/syncope (passing out). ** you will wear this for 30 days  Your physician has recommended you make the following change in your medication: INCREASE metoprolol to 1.5 tablets (37.5mg ) twice daily  Your physician recommends that you schedule a follow-up appointment after monitor.
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
## 2014-02-26 ENCOUNTER — Encounter: Payer: Self-pay | Admitting: Cardiovascular Disease

## 2014-02-26 ENCOUNTER — Encounter: Payer: Self-pay | Admitting: Internal Medicine

## 2014-03-19 ENCOUNTER — Other Ambulatory Visit: Payer: Self-pay

## 2014-03-19 DIAGNOSIS — R42 Dizziness and giddiness: Secondary | ICD-10-CM

## 2014-03-30 ENCOUNTER — Encounter: Payer: Self-pay | Admitting: Internal Medicine

## 2014-04-02 ENCOUNTER — Encounter: Payer: Self-pay | Admitting: Internal Medicine

## 2014-04-02 ENCOUNTER — Ambulatory Visit (INDEPENDENT_AMBULATORY_CARE_PROVIDER_SITE_OTHER): Payer: Medicare Other | Admitting: Internal Medicine

## 2014-04-02 VITALS — BP 167/99 | HR 77 | Ht 68.0 in | Wt 155.6 lb

## 2014-04-02 DIAGNOSIS — I4729 Other ventricular tachycardia: Secondary | ICD-10-CM | POA: Insufficient documentation

## 2014-04-02 DIAGNOSIS — R002 Palpitations: Secondary | ICD-10-CM

## 2014-04-02 DIAGNOSIS — I2510 Atherosclerotic heart disease of native coronary artery without angina pectoris: Secondary | ICD-10-CM

## 2014-04-02 DIAGNOSIS — I493 Ventricular premature depolarization: Secondary | ICD-10-CM

## 2014-04-02 DIAGNOSIS — I472 Ventricular tachycardia: Secondary | ICD-10-CM

## 2014-04-02 DIAGNOSIS — I2583 Coronary atherosclerosis due to lipid rich plaque: Secondary | ICD-10-CM

## 2014-04-02 DIAGNOSIS — I10 Essential (primary) hypertension: Secondary | ICD-10-CM

## 2014-04-02 MED ORDER — LISINOPRIL 5 MG PO TABS: 5.0000 mg | ORAL_TABLET | Freq: Every day | ORAL | Status: DC

## 2014-04-02 NOTE — Patient Instructions (Signed)
Your physician has recommended you make the following change in your medication - INCREASE lisinopril to 5mg  once daily.   Your physician recommends that you schedule a follow-up appointment in 2-3 weeks with Erasmo Downer for BP check   Your physician wants you to follow-up in: 6 months with Dr. Debara Pickett. You will receive a reminder letter in the mail two months in advance. If you don't receive a letter, please call our office to schedule the follow-up appointment.
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
## 2014-04-07 ENCOUNTER — Encounter: Payer: Self-pay | Admitting: Internal Medicine

## 2014-04-23 ENCOUNTER — Ambulatory Visit (INDEPENDENT_AMBULATORY_CARE_PROVIDER_SITE_OTHER): Payer: Medicare Other | Admitting: Pharmacist Clinician (PhC)/ Clinical Pharmacy Specialist

## 2014-04-23 ENCOUNTER — Encounter: Payer: Self-pay | Admitting: Pharmacist Clinician (PhC)/ Clinical Pharmacy Specialist

## 2014-04-23 VITALS — BP 162/90 | HR 84 | Ht 68.0 in | Wt 154.7 lb

## 2014-04-23 DIAGNOSIS — I10 Essential (primary) hypertension: Secondary | ICD-10-CM

## 2014-04-23 MED ORDER — LISINOPRIL 10 MG PO TABS: 10.0000 mg | ORAL_TABLET | Freq: Every day | ORAL | Status: DC

## 2014-04-23 NOTE — Progress Notes (Signed)
     04/23/2014 [place] 12/12/1940 [id number]   HPI:  Angel Dixon is a 73 y.o. male patient of Dr Debara Pickett, with a PMH below who presents today for hypertension clinic evaluation.  He has been well with the exception of some sinus problems and cough.  He hasn't thought it bad enough for MD visit, but is taking DiabeticTussin and getting some relief.  Pt describes cough as productive for now.    Cardiac Hx:  Cath 2012 40-50% mid circumflex, 60% OM-1and $0% PDA stenosis.    Family Hx:  Father deceased at 75 CAD, mother deceased at 105 kidney failure, 1 sister deceased in her 55s MI  Social Hx:  Quit smoking 25+ years ago, only social alcohol, caffeine free coffee and sodas  Diet:  Mostly low sodium, does eat out occasionally  Exercise:  Usually goes to [facility] for 2-2.5 miles on treadmill, hasn't been in few weeks, yard work in [place].    Home BP cuff:  Tested in office today, within 5/10 points of office reading.  Slightly more than half home readings <140/90, only 2 >150/90.  Cuff size and technique appropriate   Current Outpatient Prescriptions  Medication Sig Dispense Refill  . alfuzosin (UROXATRAL) 10 MG 24 hr tablet Take 10 mg by mouth daily as needed.    Marland Kitchen aspirin EC 81 MG tablet Take 81 mg by mouth daily.      Marland Kitchen atorvastatin (LIPITOR) 40 MG tablet Take 40 mg by mouth daily.      Marland Kitchen glipiZIDE (GLUCOTROL) 10 MG tablet Take 10 mg by mouth 2 (two) times daily before a meal.    . lisinopril (PRINIVIL,ZESTRIL) 10 MG tablet Take 1 tablet (10 mg total) by mouth daily. 30 tablet 3  . metFORMIN (GLUCOPHAGE) 500 MG tablet Take 1,000 mg by mouth 2 (two) times daily with a meal.     . metoprolol tartrate (LOPRESSOR) 25 MG tablet Take 1.5 tablets (37.5 mg total) by mouth 2 (two) times daily. 270 tablet 1  . nitroGLYCERIN (NITROSTAT) 0.4 MG SL tablet Place 1 tablet (0.4 mg total) under the tongue every 5 (five) minutes as needed for chest pain (severe chest pain or pressure  only). 25 tablet 2  . pantoprazole (PROTONIX) 40 MG tablet Take 40 mg by mouth daily.      . Potassium 99 MG TABS Take by mouth daily.     No current facility-administered medications for this visit.    Allergies  Allergen Reactions  . Sulfa Antibiotics Hives  . Vioxx [Rofecoxib] Hives  . Yellow Jacket Venom [Bee Venom] Swelling    Past Medical History  Diagnosis Date  . Hypertension   . Diabetes mellitus   . PUD (peptic ulcer disease)     endo 03/15/10  . 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 Dyslipidemia   . Dysrhythmia     "palpitation", PVCs  . Angina   . GERD (gastroesophageal reflux disease)   . CAD (coronary artery disease)     moderate, by cath    Blood pressure 162/90, pulse 84, height 5\' 8"  (1.727 m), weight 154 lb 11.2 oz (70.171 kg).    Tommy Medal PharmD CPP [facility]

## 2014-04-23 NOTE — Assessment & Plan Note (Signed)
Today in office his BP is significantly elevated from what his home readings indicate.  However readings were consistent with office cuff.  Will increase lisinopril to 10mg  daily, pt to double up on 5mg  tablets until gone.  He is to continue with daily BP checks.  Encouraged pt to get back to [facility] for regular exercise, explained benefit to his BP.  He is currently fighting a possible sinus infection and has been coughing, although has been productive cough.  Advised pt that when infection clear, if cough becomes dry and doesn't stop, he is to call office for change in BP med.  I will see him back in 1 month for follow up.

## 2014-04-23 NOTE — Patient Instructions (Signed)
Return for a a follow up appointment in 1 month  Your blood pressure today is 162/90   (goal is <140/90   Check your blood pressure at home daily and keep record of the readings.  Take your BP meds as follows - increase lisinopril from 5mg  to 10mg  once daily.  If cough becomes dry tickle please call so we can change the medication  Bring all of your meds, your BP cuff and your record of home blood pressures to your next appointment.  Exercise as you're able, try to walk approximately 30 minutes per day.  Keep salt intake to a minimum, especially watch canned and prepared boxed foods.  Eat more fresh fruits and vegetables and fewer canned items.  Avoid eating in fast food restaurants.    HOW TO TAKE YOUR BLOOD PRESSURE: . Rest 5 minutes before taking your blood pressure. .  Don't smoke or drink caffeinated beverages for at least 30 minutes before. . Take your blood pressure before (not after) you eat. . Sit comfortably with your back supported and both feet on the floor (don't cross your legs). . Elevate your arm to heart level on a table or a desk. . Use the proper sized cuff. It should fit smoothly and snugly around your bare upper arm. There should be enough room to slip a fingertip under the cuff. The bottom edge of the cuff should be 1 inch above the crease of the elbow. . Ideally, take 3 measurements at one sitting and record the average.

## 2014-05-12 ENCOUNTER — Encounter (HOSPITAL_COMMUNITY): Payer: Self-pay | Admitting: Cardiology

## 2014-05-24 ENCOUNTER — Encounter: Payer: Self-pay | Admitting: Pharmacist Clinician (PhC)/ Clinical Pharmacy Specialist

## 2014-05-24 ENCOUNTER — Ambulatory Visit (INDEPENDENT_AMBULATORY_CARE_PROVIDER_SITE_OTHER): Payer: Medicare Other | Admitting: Pharmacist Clinician (PhC)/ Clinical Pharmacy Specialist

## 2014-05-24 VITALS — BP 150/82 | HR 84 | Ht 68.0 in | Wt 158.0 lb

## 2014-05-24 DIAGNOSIS — I10 Essential (primary) hypertension: Secondary | ICD-10-CM

## 2014-05-24 MED ORDER — LISINOPRIL 10 MG PO TABS
10.0000 mg | ORAL_TABLET | Freq: Every day | ORAL | Status: DC
Start: 2014-05-24 — End: 2014-10-21

## 2014-05-24 NOTE — Progress Notes (Signed)
     05/24/2014 [place] 1940/10/26 [id number]   HPI:  Angel Dixon is a 73 y.o. male patient of Dr Debara Pickett, with a PMH below who presents today for hypertension clinic evaluation.  When I saw him last month he was having problems with sinus/chest congestion and cough.  He still is having the same problems, has an appointment with his primary MD after the first of the year.  Still taking diabetic tussin and describes the cough as productive.  Cardiac Hx:  Cath 2012 40-50% mid circumflex, 60% OM-1and $0% PDA stenosis, DM  Family Hx:  Father deceased at 2 CAD, mother deceased at 29 kidney failure, 1 sister deceased in her 66s MI  Social Hx:  Quit smoking 25+ years ago, only social alcohol, caffeine free coffee and sodas  Diet:  Mostly low sodium, does eat out occasionally  Exercise:  None, goal for new year is to get back to [facility].    Home BP cuff:  Tested in office at last visit, within 5/10 points of office reading.  Cuff size and technique appropriate.  Home readings since last visit have decreased, only 4 of 31 readings were >902 systolic, all diastolic readings were <40.  Pt reports no dizziness or orothostasis associated with increase in dose   Current Outpatient Prescriptions  Medication Sig Dispense Refill  . alfuzosin (UROXATRAL) 10 MG 24 hr tablet Take 10 mg by mouth daily as needed.    Marland Kitchen aspirin EC 81 MG tablet Take 81 mg by mouth daily.      Marland Kitchen atorvastatin (LIPITOR) 40 MG tablet Take 40 mg by mouth daily.      Marland Kitchen glipiZIDE (GLUCOTROL) 10 MG tablet Take 10 mg by mouth 2 (two) times daily before a meal.    . lisinopril (PRINIVIL,ZESTRIL) 10 MG tablet Take 1 tablet (10 mg total) by mouth daily. 90 tablet 1  . metFORMIN (GLUCOPHAGE) 500 MG tablet Take 1,000 mg by mouth 2 (two) times daily with a meal.     . metoprolol tartrate (LOPRESSOR) 25 MG tablet Take 1.5 tablets (37.5 mg total) by mouth 2 (two) times daily. 270 tablet 1  . nitroGLYCERIN (NITROSTAT) 0.4 MG SL  tablet Place 1 tablet (0.4 mg total) under the tongue every 5 (five) minutes as needed for chest pain (severe chest pain or pressure only). 25 tablet 2  . pantoprazole (PROTONIX) 40 MG tablet Take 40 mg by mouth daily.      . Potassium 99 MG TABS Take by mouth daily.     No current facility-administered medications for this visit.    Allergies  Allergen Reactions  . Sulfa Antibiotics Hives  . Vioxx [Rofecoxib] Hives  . Yellow Jacket Venom [Bee Venom] Swelling    Past Medical History  Diagnosis Date  . Hypertension   . Diabetes mellitus   . PUD (peptic ulcer disease)     endo 03/15/10  . 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 Dyslipidemia   . Dysrhythmia     "palpitation", PVCs  . Angina   . GERD (gastroesophageal reflux disease)   . CAD (coronary artery disease)     moderate, by cath    Blood pressure 150/82, pulse 84, height 5\' 8"  (1.727 m), weight 158 lb (71.668 kg).    Tommy Medal PharmD CPP [facility]

## 2014-05-24 NOTE — Patient Instructions (Signed)
Your blood pressure today is 150/82 (goal is <140/90)  Check your blood pressure at home 3-4 times per week and keep record of the readings.  Take your BP meds as follows: continue with the lisinopril 10mg  daily  Bring all of your meds, your BP cuff and your record of home blood pressures to your next appointment.  Exercise as you're able, try to walk approximately 30 minutes per day.  Keep salt intake to a minimum, especially watch canned and prepared boxed foods.  Eat more fresh fruits and vegetables and fewer canned items.  Avoid eating in fast food restaurants.    HOW TO TAKE YOUR BLOOD PRESSURE: . Rest 5 minutes before taking your blood pressure. .  Don't smoke or drink caffeinated beverages for at least 30 minutes before. . Take your blood pressure before (not after) you eat. . Sit comfortably with your back supported and both feet on the floor (don't cross your legs). . Elevate your arm to heart level on a table or a desk. . Use the proper sized cuff. It should fit smoothly and snugly around your bare upper arm. There should be enough room to slip a fingertip under the cuff. The bottom edge of the cuff should be 1 inch above the crease of the elbow. . Ideally, take 3 measurements at one sitting and record the average.

## 2014-05-24 NOTE — Assessment & Plan Note (Signed)
While his BP is still elevated in the office, at 150/82, his home readings are well WNL.  Interestingly, of the four high systolic BP readings at home, one was this morning before his appointment.  I believe this is probably related to white-coat hypertension, fortunately he is just slightly elevated when coming to the office.  Because of his DM, our goal is to keep him at <140/90.  He will continue to monitor his home BP 3-4 times per week and is to call if he notes any increase on a regular basis to >140/90.  Also reviewed the potential for lisinopril to cause a cough, although the cough he describes to me does not fit with what we would expect from ACE inhibitors.

## 2014-10-21 ENCOUNTER — Ambulatory Visit (INDEPENDENT_AMBULATORY_CARE_PROVIDER_SITE_OTHER): Payer: PPO | Admitting: Internal Medicine

## 2014-10-21 ENCOUNTER — Encounter: Payer: Self-pay | Admitting: Internal Medicine

## 2014-10-21 VITALS — BP 146/72 | HR 76 | Ht 68.0 in | Wt 156.3 lb

## 2014-10-21 DIAGNOSIS — I493 Ventricular premature depolarization: Secondary | ICD-10-CM | POA: Diagnosis not present

## 2014-10-21 DIAGNOSIS — R002 Palpitations: Secondary | ICD-10-CM | POA: Diagnosis not present

## 2014-10-21 DIAGNOSIS — I2510 Atherosclerotic heart disease of native coronary artery without angina pectoris: Secondary | ICD-10-CM | POA: Diagnosis not present

## 2014-10-21 DIAGNOSIS — I10 Essential (primary) hypertension: Secondary | ICD-10-CM

## 2014-10-21 DIAGNOSIS — I472 Ventricular tachycardia: Secondary | ICD-10-CM

## 2014-10-21 DIAGNOSIS — I4729 Other ventricular tachycardia: Secondary | ICD-10-CM

## 2014-10-21 DIAGNOSIS — I2583 Coronary atherosclerosis due to lipid rich plaque: Secondary | ICD-10-CM

## 2014-10-21 NOTE — Patient Instructions (Signed)
Your physician wants you to follow-up in:  6 months. You will receive a reminder letter in the mail two months in advance. If you don't receive a letter, please call our office to schedule the follow-up appointment.   
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
## 2014-12-16 ENCOUNTER — Encounter (HOSPITAL_COMMUNITY): Payer: Self-pay | Admitting: *Deleted

## 2014-12-16 ENCOUNTER — Emergency Department (HOSPITAL_COMMUNITY)
Admission: EM | Admit: 2014-12-16 | Discharge: 2014-12-17 | Disposition: A | Discharge status: Home / Self Care | Payer: PPO | Attending: Emergency Medicine | Admitting: Emergency Medicine

## 2014-12-16 DIAGNOSIS — K219 Gastro-esophageal reflux disease without esophagitis: Secondary | ICD-10-CM | POA: Insufficient documentation

## 2014-12-16 DIAGNOSIS — Z8711 Personal history of peptic ulcer disease: Secondary | ICD-10-CM | POA: Diagnosis not present

## 2014-12-16 DIAGNOSIS — Z9889 Other specified postprocedural states: Secondary | ICD-10-CM | POA: Insufficient documentation

## 2014-12-16 DIAGNOSIS — Z7982 Long term (current) use of aspirin: Secondary | ICD-10-CM | POA: Insufficient documentation

## 2014-12-16 DIAGNOSIS — Z79899 Other long term (current) drug therapy: Secondary | ICD-10-CM | POA: Insufficient documentation

## 2014-12-16 DIAGNOSIS — I10 Essential (primary) hypertension: Secondary | ICD-10-CM | POA: Insufficient documentation

## 2014-12-16 DIAGNOSIS — E119 Type 2 diabetes mellitus without complications: Secondary | ICD-10-CM | POA: Insufficient documentation

## 2014-12-16 DIAGNOSIS — I25119 Atherosclerotic heart disease of native coronary artery with unspecified angina pectoris: Secondary | ICD-10-CM | POA: Diagnosis not present

## 2014-12-16 DIAGNOSIS — Z8739 Personal history of other diseases of the musculoskeletal system and connective tissue: Secondary | ICD-10-CM | POA: Diagnosis not present

## 2014-12-16 DIAGNOSIS — E785 Hyperlipidemia, unspecified: Secondary | ICD-10-CM | POA: Insufficient documentation

## 2014-12-16 DIAGNOSIS — Z87891 Personal history of nicotine dependence: Secondary | ICD-10-CM | POA: Diagnosis not present

## 2014-12-16 DIAGNOSIS — M5440 Lumbago with sciatica, unspecified side: Secondary | ICD-10-CM | POA: Diagnosis not present

## 2014-12-16 DIAGNOSIS — M5441 Lumbago with sciatica, right side: Secondary | ICD-10-CM

## 2014-12-16 DIAGNOSIS — M5442 Lumbago with sciatica, left side: Secondary | ICD-10-CM

## 2014-12-16 DIAGNOSIS — M545 Low back pain: Secondary | ICD-10-CM | POA: Diagnosis present

## 2014-12-16 MED ORDER — CYCLOBENZAPRINE HCL 10 MG PO TABS
10.0000 mg | ORAL_TABLET | Freq: Once | ORAL | Status: AC
  Administered 2014-12-17: 10 mg via ORAL
  Filled 2014-12-16: qty 1

## 2014-12-16 MED ORDER — IBUPROFEN 800 MG PO TABS
800.0000 mg | ORAL_TABLET | Freq: Once | ORAL | Status: AC
  Administered 2014-12-17: 800 mg via ORAL
  Filled 2014-12-16: qty 1

## 2014-12-16 MED ORDER — ORPHENADRINE CITRATE ER 100 MG PO TB12: 100.0000 mg | ORAL_TABLET | Freq: Two times a day (BID) | ORAL | Status: DC

## 2014-12-16 MED ORDER — OXYCODONE-ACETAMINOPHEN 5-325 MG PO TABS
1.0000 | ORAL_TABLET | Freq: Once | ORAL | Status: AC
  Administered 2014-12-17: 1 via ORAL
  Filled 2014-12-16: qty 1

## 2014-12-16 MED ORDER — NAPROXEN 500 MG PO TABS: 500.0000 mg | ORAL_TABLET | Freq: Two times a day (BID) | ORAL | Status: DC

## 2014-12-16 MED ORDER — OXYCODONE-ACETAMINOPHEN 5-325 MG PO TABS: 1.0000 | ORAL_TABLET | ORAL | Status: DC | PRN

## 2014-12-16 NOTE — ED Notes (Signed)
Upper lumbar pain x 2 days w/radiating burning pain into bilateral legs.  Denies loss of bowel or bladder control.  Cannot identify any recent injury or overuse.

## 2014-12-16 NOTE — Discharge Instructions (Signed)
Back Pain, Adult °Low back pain is very common. About 1 in 5 people have back pain. The cause of low back pain is rarely dangerous. The pain often gets better over time. About half of people with a sudden onset of back pain feel better in just 2 weeks. About 8 in 10 people feel better by 6 weeks.  °CAUSES °Some common causes of back pain include: °· Strain of the muscles or ligaments supporting the spine. °· Wear and tear (degeneration) of the spinal discs. °· Arthritis. °· Direct injury to the back. °DIAGNOSIS °Most of the time, the direct cause of low back pain is not known. However, back pain can be treated effectively even when the exact cause of the pain is unknown. Answering your caregiver's questions about your overall health and symptoms is one of the most accurate ways to make sure the cause of your pain is not dangerous. If your caregiver needs more information, he or she may order lab work or imaging tests (X-rays or MRIs). However, even if imaging tests show changes in your back, this usually does not require surgery. °HOME CARE INSTRUCTIONS °For many people, back pain returns. Since low back pain is rarely dangerous, it is often a condition that people can learn to manage on their own.  °· Remain active. It is stressful on the back to sit or stand in one place. Do not sit, drive, or stand in one place for more than 30 minutes at a time. Take short walks on level surfaces as soon as pain allows. Try to increase the length of time you walk each day. °· Do not stay in bed. Resting more than 1 or 2 days can delay your recovery. °· Do not avoid exercise or work. Your body is made to move. It is not dangerous to be active, even though your back may hurt. Your back will likely heal faster if you return to being active before your pain is gone. °· Pay attention to your body when you  bend and lift. Many people have less discomfort when lifting if they bend their knees, keep the load close to their bodies, and  avoid twisting. Often, the most comfortable positions are those that put less stress on your recovering back. °· Find a comfortable position to sleep. Use a firm mattress and lie on your side with your knees slightly bent. If you lie on your back, put a pillow under your knees. °· Only take over-the-counter or prescription medicines as directed by your caregiver. Over-the-counter medicines to reduce pain and inflammation are often the most helpful. Your caregiver may prescribe muscle relaxant drugs. These medicines help dull your pain so you can more quickly return to your normal activities and healthy exercise. °· Put ice on the injured area. °¨ Put ice in a plastic bag. °¨ Place a towel between your skin and the bag. °¨ Leave the ice on for 15-20 minutes, 03-04 times a day for the first 2 to 3 days. After that, ice and heat may be alternated to reduce pain and spasms. °· Ask your caregiver about trying back exercises and gentle massage. This may be of some benefit. °· Avoid feeling anxious or stressed. Stress increases muscle tension and can worsen back pain. It is important to recognize when you are anxious or stressed and learn ways to manage it. Exercise is a great option. °SEEK MEDICAL CARE IF: °· You have pain that is not relieved with rest or medicine. °· You have pain that does not improve in 1 week. °· You have new symptoms. °· You are generally not feeling well. °SEEK   IMMEDIATE MEDICAL CARE IF:   You have pain that radiates from your back into your legs.  You develop new bowel or bladder control problems.  You have unusual weakness or numbness in your arms or legs.  You develop nausea or vomiting.  You develop abdominal pain.  You feel faint. Document Released: 05/21/2005 Document Revised: 11/20/2011 Document Reviewed: 09/22/2013 [facility] Patient Information 2015 [place], [place]. This information is not intended to replace advice given to you by your health care provider. Make sure you  discuss any questions you have with your health care provider.  Naproxen and naproxen sodium oral immediate-release tablets What is this medicine? NAPROXEN (na PROX en) is a non-steroidal anti-inflammatory drug (NSAID). It is used to reduce swelling and to treat pain. This medicine may be used for dental pain, headache, or painful monthly periods. It is also used for painful joint and muscular problems such as arthritis, tendinitis, bursitis, and gout. This medicine may be used for other purposes; ask your health care provider or pharmacist if you have questions. COMMON BRAND NAME(S): Aflaxen, Aleve, Aleve Arthritis, All Day Relief, Anaprox, Anaprox DS, Naprosyn What should I tell my health care provider before I take this medicine? They need to know if you have any of these conditions: -asthma -cigarette smoker -drink more than 3 alcohol containing drinks a day -heart disease or circulation problems such as heart failure or leg edema (fluid retention) -high blood pressure -kidney disease -liver disease -stomach bleeding or ulcers -an unusual or allergic reaction to naproxen, aspirin, other NSAIDs, other medicines, foods, dyes, or preservatives -pregnant or trying to get pregnant -breast-feeding How should I use this medicine? Take this medicine by mouth with a glass of water. Follow the directions on the prescription label. Take it with food if your stomach gets upset. Try to not lie down for at least 10 minutes after you take it. Take your medicine at regular intervals. Do not take your medicine more often than directed. Long-term, continuous use may increase the risk of heart attack or stroke. A special MedGuide will be given to you by the pharmacist with each prescription and refill. Be sure to read this information carefully each time. Talk to your pediatrician regarding the use of this medicine in children. Special care may be needed. Overdosage: If you think you have taken too much of  this medicine contact a poison control center or emergency room at once. NOTE: This medicine is only for you. Do not share this medicine with others. What if I miss a dose? If you miss a dose, take it as soon as you can. If it is almost time for your next dose, take only that dose. Do not take double or extra doses. What may interact with this medicine? -alcohol -aspirin -cidofovir -diuretics -lithium -methotrexate -other drugs for inflammation like ketorolac or prednisone -pemetrexed -probenecid -warfarin This list may not describe all possible interactions. Give your health care provider a list of all the medicines, herbs, non-prescription drugs, or dietary supplements you use. Also tell them if you smoke, drink alcohol, or use illegal drugs. Some items may interact with your medicine. What should I watch for while using this medicine? Tell your doctor or health care professional if your pain does not get better. Talk to your doctor before taking another medicine for pain. Do not treat yourself. This medicine does not prevent heart attack or stroke. In fact, this medicine may increase the chance of a heart attack or stroke. The chance may increase  with longer use of this medicine and in people who have heart disease. If you take aspirin to prevent heart attack or stroke, talk with your doctor or health care professional. Do not take other medicines that contain aspirin, ibuprofen, or naproxen with this medicine. Side effects such as stomach upset, nausea, or ulcers may be more likely to occur. Many medicines available without a prescription should not be taken with this medicine. This medicine can cause ulcers and bleeding in the stomach and intestines at any time during treatment. Do not smoke cigarettes or drink alcohol. These increase irritation to your stomach and can make it more susceptible to damage from this medicine. Ulcers and bleeding can happen without warning symptoms and can cause  death. You may get drowsy or dizzy. Do not drive, use machinery, or do anything that needs mental alertness until you know how this medicine affects you. Do not stand or sit up quickly, especially if you are an older patient. This reduces the risk of dizzy or fainting spells. This medicine can cause you to bleed more easily. Try to avoid damage to your teeth and gums when you brush or floss your teeth. What side effects may I notice from receiving this medicine? Side effects that you should report to your doctor or health care professional as soon as possible: -black or bloody stools, blood in the urine or vomit -blurred vision -chest pain -difficulty breathing or wheezing -nausea or vomiting -severe stomach pain -skin rash, skin redness, blistering or peeling skin, hives, or itching -slurred speech or weakness on one side of the body -swelling of eyelids, throat, lips -unexplained weight gain or swelling -unusually weak or tired -yellowing of eyes or skin Side effects that usually do not require medical attention (report to your doctor or health care professional if they continue or are bothersome): -constipation -headache -heartburn This list may not describe all possible side effects. Call your doctor for medical advice about side effects. You may report side effects to FDA at 1-800-FDA-1088. Where should I keep my medicine? Keep out of the reach of children. Store at room temperature between 15 and 30 degrees C (59 and 86 degrees F). Keep container tightly closed. Throw away any unused medicine after the expiration date. NOTE: This sheet is a summary. It may not cover all possible information. If you have questions about this medicine, talk to your doctor, pharmacist, or health care provider.  2015, Elsevier/[employer] Standard. (2009-05-23 20:10:16)  Orphenadrine tablets What is this medicine? ORPHENADRINE (or FEN a dreen) helps to relieve pain and stiffness in muscles and can treat  muscle spasms. This medicine may be used for other purposes; ask your health care provider or pharmacist if you have questions. COMMON BRAND NAME(S): Norflex What should I tell my health care provider before I take this medicine? They need to know if you have any of these conditions: -glaucoma -heart disease -kidney disease -myasthenia gravis -peptic ulcer disease -prostate disease -stomach problems -an unusual or allergic reaction to orphenadrine, other medicines, foods, lactose, dyes, or preservatives -pregnant or trying to get pregnant -breast-feeding How should I use this medicine? Take this medicine by mouth with a full glass of water. Follow the directions on the prescription label. Take your medicine at regular intervals. Do not take your medicine more often than directed. Do not take more than you are told to take. Talk to your pediatrician regarding the use of this medicine in children. Special care may be needed. Patients over 61 years old  may have a stronger reaction and need a smaller dose. Overdosage: If you think you have taken too much of this medicine contact a poison control center or emergency room at once. NOTE: This medicine is only for you. Do not share this medicine with others. What if I miss a dose? If you miss a dose, take it as soon as you can. If it is almost time for your next dose, take only that dose. Do not take double or extra doses. What may interact with this medicine? -alcohol -antihistamines -barbiturates, like phenobarbital -benzodiazepines -cyclobenzaprine -medicines for pain -phenothiazines like chlorpromazine, mesoridazine, prochlorperazine, thioridazine This list may not describe all possible interactions. Give your health care provider a list of all the medicines, herbs, non-prescription drugs, or dietary supplements you use. Also tell them if you smoke, drink alcohol, or use illegal drugs. Some items may interact with your medicine. What  should I watch for while using this medicine? Your mouth may get dry. Chewing sugarless gum or sucking hard candy, and drinking plenty of water may help. Contact your doctor if the problem does not go away or is severe. This medicine may cause dry eyes and blurred vision. If you wear contact lenses you may feel some discomfort. Lubricating drops may help. See your eye doctor if the problem does not go away or is severe. You may get drowsy or dizzy. Do not drive, use machinery, or do anything that needs mental alertness until you know how this medicine affects you. Do not stand or sit up quickly, especially if you are an older patient. This reduces the risk of dizzy or fainting spells. Alcohol may interfere with the effect of this medicine. Avoid alcoholic drinks. What side effects may I notice from receiving this medicine? Side effects that you should report to your doctor or health care professional as soon as possible: -allergic reactions like skin rash, itching or hives, swelling of the face, lips, or tongue -changes in vision -difficulty breathing -fast heartbeat or palpitations -hallucinations -light headedness, fainting spells -vomiting Side effects that usually do not require medical attention (report to your doctor or health care professional if they continue or are bothersome): -dizziness -drowsiness -headache -nausea This list may not describe all possible side effects. Call your doctor for medical advice about side effects. You may report side effects to FDA at 1-800-FDA-1088. Where should I keep my medicine? Keep out of the reach of children. Store at room temperature between 15 and 30 degrees C (59 and 86 degrees F). Protect from light. Keep container tightly closed. Throw away any unused medicine after the expiration date. NOTE: This sheet is a summary. It may not cover all possible information. If you have questions about this medicine, talk to your doctor, pharmacist, or health  care provider.  2015, [employer]/Gold Standard. (2007-12-16 17:19:12)  Acetaminophen; Oxycodone tablets What is this medicine? ACETAMINOPHEN; OXYCODONE (a set a MEE noe fen; ox i KOE done) is a pain reliever. It is used to treat mild to moderate pain. This medicine may be used for other purposes; ask your health care provider or pharmacist if you have questions. COMMON BRAND NAME(S): Endocet, Magnacet, Narvox, Percocet, Perloxx, Primalev, Primlev, Roxicet, Xolox What should I tell my health care provider before I take this medicine? They need to know if you have any of these conditions: -brain tumor -Crohn's disease, inflammatory bowel disease, or ulcerative colitis -drug abuse or addiction -head injury -heart or circulation problems -if you often drink alcohol -kidney disease or problems  going to the bathroom -liver disease -lung disease, asthma, or breathing problems -an unusual or allergic reaction to acetaminophen, oxycodone, other opioid analgesics, other medicines, foods, dyes, or preservatives -pregnant or trying to get pregnant -breast-feeding How should I use this medicine? Take this medicine by mouth with a full glass of water. Follow the directions on the prescription label. Take your medicine at regular intervals. Do not take your medicine more often than directed. Talk to your pediatrician regarding the use of this medicine in children. Special care may be needed. Patients over 13 years old may have a stronger reaction and need a smaller dose. Overdosage: If you think you have taken too much of this medicine contact a poison control center or emergency room at once. NOTE: This medicine is only for you. Do not share this medicine with others. What if I miss a dose? If you miss a dose, take it as soon as you can. If it is almost time for your next dose, take only that dose. Do not take double or extra doses. What may interact with this  medicine? -alcohol -antihistamines -barbiturates like amobarbital, butalbital, butabarbital, methohexital, pentobarbital, phenobarbital, thiopental, and secobarbital -benztropine -drugs for bladder problems like solifenacin, trospium, oxybutynin, tolterodine, hyoscyamine, and methscopolamine -drugs for breathing problems like ipratropium and tiotropium -drugs for certain stomach or intestine problems like propantheline, homatropine methylbromide, glycopyrrolate, atropine, belladonna, and dicyclomine -general anesthetics like etomidate, ketamine, nitrous oxide, propofol, desflurane, enflurane, halothane, isoflurane, and sevoflurane -medicines for depression, anxiety, or psychotic disturbances -medicines for sleep -muscle relaxants -naltrexone -narcotic medicines (opiates) for pain -phenothiazines like perphenazine, thioridazine, chlorpromazine, mesoridazine, fluphenazine, prochlorperazine, promazine, and trifluoperazine -scopolamine -tramadol -trihexyphenidyl This list may not describe all possible interactions. Give your health care provider a list of all the medicines, herbs, non-prescription drugs, or dietary supplements you use. Also tell them if you smoke, drink alcohol, or use illegal drugs. Some items may interact with your medicine. What should I watch for while using this medicine? Tell your doctor or health care professional if your pain does not go away, if it gets worse, or if you have new or a different type of pain. You may develop tolerance to the medicine. Tolerance means that you will need a higher dose of the medication for pain relief. Tolerance is normal and is expected if you take this medicine for a long time. Do not suddenly stop taking your medicine because you may develop a severe reaction. Your body becomes used to the medicine. This does NOT mean you are addicted. Addiction is a behavior related to getting and using a drug for a non-medical reason. If you have pain, you  have a medical reason to take pain medicine. Your doctor will tell you how much medicine to take. If your doctor wants you to stop the medicine, the dose will be slowly lowered over time to avoid any side effects. You may get drowsy or dizzy. Do not drive, use machinery, or do anything that needs mental alertness until you know how this medicine affects you. Do not stand or sit up quickly, especially if you are an older patient. This reduces the risk of dizzy or fainting spells. Alcohol may interfere with the effect of this medicine. Avoid alcoholic drinks. There are different types of narcotic medicines (opiates) for pain. If you take more than one type at the same time, you may have more side effects. Give your health care provider a list of all medicines you use. Your doctor will tell you how  much medicine to take. Do not take more medicine than directed. Call emergency for help if you have problems breathing. The medicine will cause constipation. Try to have a bowel movement at least every 2 to 3 days. If you do not have a bowel movement for 3 days, call your doctor or health care professional. Do not take Tylenol (acetaminophen) or medicines that have acetaminophen with this medicine. Too much acetaminophen can be very dangerous. Many nonprescription medicines contain acetaminophen. Always read the labels carefully to avoid taking more acetaminophen. What side effects may I notice from receiving this medicine? Side effects that you should report to your doctor or health care professional as soon as possible: -allergic reactions like skin rash, itching or hives, swelling of the face, lips, or tongue -breathing difficulties, wheezing -confusion -light headedness or fainting spells -severe stomach pain -unusually weak or tired -yellowing of the skin or the whites of the eyes Side effects that usually do not require medical attention (report to your doctor or health care professional if they continue  or are bothersome): -dizziness -drowsiness -nausea -vomiting This list may not describe all possible side effects. Call your doctor for medical advice about side effects. You may report side effects to FDA at 1-800-FDA-1088. Where should I keep my medicine? Keep out of the reach of children. This medicine can be abused. Keep your medicine in a safe place to protect it from theft. Do not share this medicine with anyone. Selling or giving away this medicine is dangerous and against the law. Store at room temperature between 20 and 25 degrees C (68 and 77 degrees F). Keep container tightly closed. Protect from light. This medicine may cause accidental overdose and death if it is taken by other adults, children, or pets. Flush any unused medicine down the toilet to reduce the chance of harm. Do not use the medicine after the expiration date. NOTE: This sheet is a summary. It may not cover all possible information. If you have questions about this medicine, talk to your doctor, pharmacist, or health care provider.  2015, [employer] Standard. (2013-01-12 13:17:35)

## 2014-12-16 NOTE — ED Notes (Signed)
Pt checked on. NAD noted. Delay explained.

## 2014-12-16 NOTE — ED Provider Notes (Signed)
CSN: [id number]     Arrival date & time 12/16/14  2150 History  This chart was scribed for Angel Fuel, MD by Rayna Sexton, ED scribe. This patient was seen in room APA10/APA10 and the patient's care was started at 11:34 PM.    Chief Complaint  Patient presents with  . Back Pain   The history is provided by the patient. No language interpreter was used.    HPI Comments: Angel Dixon is a 74 y.o. male, with a history of DJD, who presents to the Emergency Department complaining of constant, moderate, generalized, lower back pain with onset 2 days ago. Pt notes radiation of pain down his sides and legs, rates it as a 10/10, describes it as burning and notes associated tingling that radiates from his bilateral hips to the lateral aspect of his bilateral upper legs. He notes a worsening of his symptoms when standing or ambulating. He notes taking 800 mg ibuprofen with no relief of his symptoms. Pt notes a SHx to his lumbar region in 2005 that was performed at [facility]. He denies any bowel or bladder issues or any recent trauma.   Past Medical History  Diagnosis Date  . Hypertension   . Diabetes mellitus   . PUD (peptic ulcer disease)     endo 03/15/10  . 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 Dyslipidemia   . Dysrhythmia     "palpitation", PVCs  . Angina   . GERD (gastroesophageal reflux disease)   . CAD (coronary artery disease)     moderate, by cath   Past Surgical History  Procedure Laterality Date  . Hernia repair    . Back surgery  2005  . Lumbar laminectomy  09/2003    L2-3, 3-4, 4-5  . Inguinal hernia repair  1982    right  . Transthoracic echocardiogram  03/28/2012    EF 57-47%, grade 1 diastolic dysfunction, mod AV calcification, mild MR  . Nm myocar perf wall motion  10/03/2009    bruce myoview - normal perfusion in all regions, EF 70%, no ischemia, low risk  . Cardiac catheterization  02/22/2006    mild obs. CAD 60-70% narrowing in  small inferior branch of 1st diagonal of LAD, smooth 10% narrowing of mid RCA (Dr. Corky Downs)  . 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 Marella Chimes)  . Cardiac catheterization  05/11/2011    nonobstructive CAD (Dr. Roni Bread)  . Left heart catheterization with coronary angiogram N/A 05/14/2011    Procedure: LEFT HEART CATHETERIZATION WITH CORONARY ANGIOGRAM;  Surgeon: [name], MD;  Location: [facility] CATH LAB;  Service: Cardiovascular;  Laterality: N/A;  Coronary angiogram and possible PCI   Family History  Problem Relation Age of Onset  . Coronary artery disease Father     died 36  . Kidney failure Mother     died 105  . Sarcoidosis Daughter     died 53  . Heart attack Sister     died of MI in her 71s   History  Substance Use Topics  . Smoking status: Former Smoker -- 1.00 packs/day for 25 years    Types: Cigarettes  . Smokeless tobacco: Not on file     Comment: quit smoking cigarettes ~ 1990  . Alcohol Use: Yes     Comment: 05/11/11 "shot every now and then"    Review of Systems  Gastrointestinal:  Negative for diarrhea and constipation.  Genitourinary: Negative for urgency, frequency, decreased urine volume and difficulty urinating.  Musculoskeletal: Positive for myalgias and back pain.  All other systems reviewed and are negative.  Allergies  Lisinopril; Sulfa antibiotics; Vioxx; and Yellow jacket venom  Home Medications   Prior to Admission medications   Medication Sig Start Date End Date Taking? Authorizing Provider  alfuzosin (UROXATRAL) 10 MG 24 hr tablet Take 10 mg by mouth daily as needed. 12/11/12   Historical Provider, MD  aspirin EC 81 MG tablet Take 81 mg by mouth daily.      Historical Provider, MD  atorvastatin (LIPITOR) 40 MG tablet Take 40 mg by mouth daily.      Historical Provider, MD  fluticasone (FLONASE) 50 MCG/ACT nasal spray Place 1 spray into both nostrils as needed.  09/20/14   Historical Provider, MD  glipiZIDE (GLUCOTROL) 10 MG tablet Take 10 mg by mouth 2 (two) times daily before a meal.    Historical Provider, MD  losartan (COZAAR) 50 MG tablet Take 50 mg by mouth daily. 09/29/14   Historical Provider, MD  metFORMIN (GLUCOPHAGE) 500 MG tablet Take 1,000 mg by mouth 2 (two) times daily with a meal.  05/17/11   Erlene Quan, PA-C  metoprolol tartrate (LOPRESSOR) 25 MG tablet Take 1.5 tablets (37.5 mg total) by mouth 2 (two) times daily. 02/25/14   Pixie Casino, MD  nitroGLYCERIN (NITROSTAT) 0.4 MG SL tablet Place 1 tablet (0.4 mg total) under the tongue every 5 (five) minutes as needed for chest pain (severe chest pain or pressure only). 05/14/11 10/21/14  Erlene Quan, PA-C  pantoprazole (PROTONIX) 40 MG tablet Take 40 mg by mouth daily.      Historical Provider, MD  Potassium 99 MG TABS Take by mouth daily.    Historical Provider, MD   BP 164/75 mmHg  Pulse 84  Temp(Src) 98 F (36.7 C) (Oral)  Resp 18  Ht 5\' 8"  (1.727 m)  Wt 158 lb (71.668 kg)  BMI 24.03 kg/m2  SpO2 100% Physical Exam  Constitutional: He is oriented to person, place, and time. He appears well-developed and well-nourished.  HENT:  Head: Normocephalic and atraumatic.  Eyes: Conjunctivae and EOM are normal. Pupils are equal, round, and reactive to light. No scleral icterus.  Neck: Normal range of motion. Neck supple. No JVD present.  Cardiovascular: Normal rate, regular rhythm and normal heart sounds.   No murmur heard. Pulmonary/Chest: Effort normal and breath sounds normal. He has no wheezes. He has no rales. He exhibits no tenderness.  Abdominal: Soft. Bowel sounds are normal. He exhibits no distension and no mass. There is no tenderness.  Musculoskeletal: Normal range of motion. He exhibits tenderness. He exhibits no edema.  Moderate tenderness in upper lumbar spine; marked bilateral para lumbar spasm; positive straight leg raise bilaterally at 45 degrees;   Lymphadenopathy:     He has no cervical adenopathy.  Neurological: He is alert and oriented to person, place, and time. He exhibits normal muscle tone. Coordination normal.  No objective motor or sensory deficits.  Skin: Skin is warm and dry. No rash noted.  Psychiatric: He has a normal mood and affect. His behavior is normal. Judgment and thought content normal.  Nursing note and vitals reviewed.   ED Course  Procedures  DIAGNOSTIC STUDIES: Oxygen Saturation is 100% on RA, normal by my interpretation.    COORDINATION OF CARE: 11:50 PM Discussed treatment plan with pt at bedside and pt agreed to  plan.  MDM   Final diagnoses:  Bilateral low back pain with sciatica, sciatica laterality unspecified    Lumbar pain with bilateral sciatica inpatient who is status post lumbar surgery. No evidence of neurologic deficit. Old records are reviewed and he does have a CT scan of abdomen and pelvis in 2008 which showed no evidence of abdominal aortic aneurysm. He is given prescriptions for naproxen, orphenadrine, and oxycodone-acetaminophen and referred back to the orthopedic surgeon who had done his original back surgery.  I personally performed the services described in this documentation, which was scribed in my presence. The recorded information has been reviewed and is accurate.      Angel Fuel, MD [id number]

## 2014-12-20 ENCOUNTER — Emergency Department (HOSPITAL_COMMUNITY)
Admission: EM | Admit: 2014-12-20 | Discharge: 2014-12-20 | Disposition: A | Discharge status: Home / Self Care | Payer: PPO | Attending: Emergency Medicine | Admitting: Emergency Medicine

## 2014-12-20 ENCOUNTER — Encounter (HOSPITAL_COMMUNITY): Payer: Self-pay | Admitting: *Deleted

## 2014-12-20 ENCOUNTER — Emergency Department (HOSPITAL_COMMUNITY): Payer: PPO

## 2014-12-20 DIAGNOSIS — Z9889 Other specified postprocedural states: Secondary | ICD-10-CM | POA: Insufficient documentation

## 2014-12-20 DIAGNOSIS — E119 Type 2 diabetes mellitus without complications: Secondary | ICD-10-CM | POA: Diagnosis not present

## 2014-12-20 DIAGNOSIS — R34 Anuria and oliguria: Secondary | ICD-10-CM | POA: Insufficient documentation

## 2014-12-20 DIAGNOSIS — G8929 Other chronic pain: Secondary | ICD-10-CM | POA: Insufficient documentation

## 2014-12-20 DIAGNOSIS — Z79899 Other long term (current) drug therapy: Secondary | ICD-10-CM | POA: Diagnosis not present

## 2014-12-20 DIAGNOSIS — K219 Gastro-esophageal reflux disease without esophagitis: Secondary | ICD-10-CM | POA: Insufficient documentation

## 2014-12-20 DIAGNOSIS — M5136 Other intervertebral disc degeneration, lumbar region: Secondary | ICD-10-CM | POA: Diagnosis not present

## 2014-12-20 DIAGNOSIS — Z8711 Personal history of peptic ulcer disease: Secondary | ICD-10-CM | POA: Diagnosis not present

## 2014-12-20 DIAGNOSIS — I25119 Atherosclerotic heart disease of native coronary artery with unspecified angina pectoris: Secondary | ICD-10-CM | POA: Diagnosis not present

## 2014-12-20 DIAGNOSIS — Z791 Long term (current) use of non-steroidal anti-inflammatories (NSAID): Secondary | ICD-10-CM | POA: Diagnosis not present

## 2014-12-20 DIAGNOSIS — M545 Low back pain: Secondary | ICD-10-CM | POA: Diagnosis present

## 2014-12-20 DIAGNOSIS — Z7982 Long term (current) use of aspirin: Secondary | ICD-10-CM | POA: Insufficient documentation

## 2014-12-20 DIAGNOSIS — E785 Hyperlipidemia, unspecified: Secondary | ICD-10-CM | POA: Diagnosis not present

## 2014-12-20 DIAGNOSIS — Z87891 Personal history of nicotine dependence: Secondary | ICD-10-CM | POA: Insufficient documentation

## 2014-12-20 LAB — URINALYSIS, ROUTINE W REFLEX MICROSCOPIC
BILIRUBIN URINE: NEGATIVE
Glucose, UA: 100 mg/dL — AB
Hgb urine dipstick: NEGATIVE
Ketones, ur: NEGATIVE mg/dL
Leukocytes, UA: NEGATIVE
Nitrite: NEGATIVE
Protein, ur: NEGATIVE mg/dL
SPECIFIC GRAVITY, URINE: 1.009 (ref 1.005–1.030)
Urobilinogen, UA: 0.2 mg/dL (ref 0.0–1.0)
pH: 5.5 (ref 5.0–8.0)

## 2014-12-20 MED ORDER — HYDROMORPHONE HCL 1 MG/ML IJ SOLN
1.0000 mg | Freq: Once | INTRAMUSCULAR | Status: AC
Start: 2014-12-20 — End: 2014-12-20
  Administered 2014-12-20: 1 mg via INTRAMUSCULAR
  Filled 2014-12-20: qty 1

## 2014-12-20 MED ORDER — OXYCODONE-ACETAMINOPHEN 5-325 MG PO TABS: 2.0000 | ORAL_TABLET | ORAL | Status: DC | PRN

## 2014-12-20 NOTE — Discharge Instructions (Signed)

## 2014-12-20 NOTE — ED Notes (Signed)
Patient verified his son was on the way to pick him up.

## 2014-12-20 NOTE — ED Provider Notes (Signed)
CSN: [id number]     Arrival date & time 12/20/14  1227 History   This chart was scribed for non-physician practitioner, Glendell Docker, NP, working with Leonard Schwartz, MD, by Helane Gunther ED Scribe. This patient was seen in room TR08C/TR08C and the patient's care was started at 1:00 PM    Chief Complaint  Patient presents with  . Back Pain  The history is provided by the patient. No language interpreter was used.   HPI Comments: Angel Dixon is a 74 y.o. male who presents to the Emergency Department complaining of constant, aching, chronic lower back pain onset 2015. Pt reports the pain progressively worsening in the past few months. He has been to the [facility] ED on 7/14, where he received pain medication with no relief. He came to the ED today because he has had no improvement and would like to receive an MRI or XR to find out what is wrong, as well as to receive more effective pain medication. He has a Past surgical Hx on his back, done by Dr Shellia Carwin. Pt is not having incontinence and no recent injury. He has a PMHx of CAD, DM, and HTN.  His PCP is Dr Karie Kirks.   Past Medical History  Diagnosis Date  . Hypertension   . Diabetes mellitus   . PUD (peptic ulcer disease)     endo 03/15/10  . 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 Dyslipidemia   . Dysrhythmia     "palpitation", PVCs  . Angina   . GERD (gastroesophageal reflux disease)   . CAD (coronary artery disease)     moderate, by cath   Past Surgical History  Procedure Laterality Date  . Hernia repair    . Back surgery  2005  . Lumbar laminectomy  09/2003    L2-3, 3-4, 4-5  . Inguinal hernia repair  1982    right  . Transthoracic echocardiogram  03/28/2012    EF 02-72%, grade 1 diastolic dysfunction, mod AV calcification, mild MR  . Nm myocar perf wall motion  10/03/2009    bruce myoview - normal perfusion in all regions, EF 70%, no ischemia, low risk  . Cardiac catheterization  02/22/2006    mild  obs. CAD 60-70% narrowing in small inferior branch of 1st diagonal of LAD, smooth 10% narrowing of mid RCA (Dr. Corky Downs)  . 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 Marella Chimes)  . Cardiac catheterization  05/11/2011    nonobstructive CAD (Dr. Roni Bread)  . Left heart catheterization with coronary angiogram N/A 05/14/2011    Procedure: LEFT HEART CATHETERIZATION WITH CORONARY ANGIOGRAM;  Surgeon: [name], MD;  Location: [facility] CATH LAB;  Service: Cardiovascular;  Laterality: N/A;  Coronary angiogram and possible PCI   Family History  Problem Relation Age of Onset  . Coronary artery disease Father     died 39  . Kidney failure Mother     died 24  . Sarcoidosis Daughter     died 63  . Heart attack Sister     died of MI in her 46s   History  Substance Use Topics  . Smoking status: Former Smoker -- 1.00 packs/day for 25 years    Types: Cigarettes  . Smokeless tobacco: Not on file     Comment: quit smoking cigarettes ~ 1990  . Alcohol Use: Yes     Comment: 05/11/11 "shot  every now and then"    Review of Systems  Genitourinary: Positive for decreased urine volume.  Musculoskeletal: Positive for back pain.  All other systems reviewed and are negative.   Allergies  Lisinopril; Sulfa antibiotics; Vioxx; and Yellow jacket venom  Home Medications   Prior to Admission medications   Medication Sig Start Date End Date Taking? Authorizing Provider  alfuzosin (UROXATRAL) 10 MG 24 hr tablet Take 10 mg by mouth daily as needed. 12/11/12   Historical Provider, MD  aspirin EC 81 MG tablet Take 81 mg by mouth daily.      Historical Provider, MD  atorvastatin (LIPITOR) 40 MG tablet Take 40 mg by mouth daily.      Historical Provider, MD  fluticasone (FLONASE) 50 MCG/ACT nasal spray Place 1 spray into both nostrils as needed. 09/20/14   Historical Provider, MD  glipiZIDE (GLUCOTROL) 10 MG tablet Take 10 mg by mouth  2 (two) times daily before a meal.    Historical Provider, MD  losartan (COZAAR) 50 MG tablet Take 50 mg by mouth daily. 09/29/14   Historical Provider, MD  metFORMIN (GLUCOPHAGE) 500 MG tablet Take 1,000 mg by mouth 2 (two) times daily with a meal.  05/17/11   Erlene Quan, PA-C  metoprolol tartrate (LOPRESSOR) 25 MG tablet Take 1.5 tablets (37.5 mg total) by mouth 2 (two) times daily. 02/25/14   Pixie Casino, MD  naproxen (NAPROSYN) 500 MG tablet Take 1 tablet (500 mg total) by mouth 2 (two) times daily. 1/95/09   Delora Fuel, MD  nitroGLYCERIN (NITROSTAT) 0.4 MG SL tablet Place 1 tablet (0.4 mg total) under the tongue every 5 (five) minutes as needed for chest pain (severe chest pain or pressure only). 05/14/11 10/21/14  Erlene Quan, PA-C  orphenadrine (NORFLEX) 100 MG tablet Take 1 tablet (100 mg total) by mouth 2 (two) times daily. 08/27/69   Delora Fuel, MD  oxyCODONE-acetaminophen (PERCOCET) 5-325 MG per tablet Take 1 tablet by mouth every 4 (four) hours as needed for moderate pain. 2/45/80   Delora Fuel, MD  oxyCODONE-acetaminophen (PERCOCET/ROXICET) 5-325 MG per tablet Take 1 tablet by mouth every 4 (four) hours as needed. 9/98/33   Delora Fuel, MD  pantoprazole (PROTONIX) 40 MG tablet Take 40 mg by mouth daily.      Historical Provider, MD  Potassium 99 MG TABS Take by mouth daily.    Historical Provider, MD   BP 188/91 mmHg  Pulse 71  Temp(Src) 98.4 F (36.9 C) (Oral)  Resp 16  Ht 5\' 8"  (1.727 m)  Wt 157 lb (71.215 kg)  BMI 23.88 kg/m2  SpO2 99% Physical Exam  Constitutional: He is oriented to person, place, and time. He appears well-developed and well-nourished. No distress.  HENT:  Head: Normocephalic and atraumatic.  Mouth/Throat: Oropharynx is clear and moist.  Eyes: Conjunctivae and EOM are normal. Pupils are equal, round, and reactive to light.  Neck: Normal range of motion. Neck supple. No tracheal deviation present.  Cardiovascular: Normal rate.   Pulmonary/Chest:  Breath sounds normal. No respiratory distress.  Abdominal: Soft.  Musculoskeletal: Normal range of motion.  Lumbar spine and paraspinal tenderness. Pt tender over past surgery scar. Pt is able to do straight leg raises at 45 degrees. No motor or sensory deficit noted  Neurological: He is alert and oriented to person, place, and time.  Skin: Skin is warm and dry.  Psychiatric: He has a normal mood and affect. His behavior is normal.  Nursing note and vitals reviewed.  ED Course  Procedures  DIAGNOSTIC STUDIES: Oxygen Saturation is 97% on RA, adequate by my interpretation.    COORDINATION OF CARE: 1:07 PM - Discussed plans to order an XR, urinalysis, and pain medication. Pt advised of plan for treatment and pt agrees.  Labs Review Labs Reviewed  URINALYSIS, ROUTINE W REFLEX MICROSCOPIC (NOT AT [facility]) - Abnormal; Notable for the following:    Glucose, UA 100 (*)    All other components within normal limits    Imaging Review Dg Lumbar Spine Complete  12/20/2014   CLINICAL DATA:  Low back pain.  The pain started in June.  EXAM: LUMBAR SPINE - COMPLETE 4+ VIEW  COMPARISON:  July 21, 2013  FINDINGS: There is no evidence of lumbar spine fracture. Alignment is normal. There are degenerative joint changes throughout lumbar spine with narrowed joint space and osteophyte formation. Stable calcified mesenteric lymph nodes are unchanged.  IMPRESSION: No acute fracture or dislocation. Osteoarthritic changes of lumbar spine.   Electronically Signed   By: Abelardo Diesel M.D.   On: 12/20/2014 14:04     EKG Interpretation None      MDM   Final diagnoses:  Degenerative disc disease, lumbar    Pt is feeling better with dilaudid. No acute process noted on x-ray. No red flag symptoms. Pt is okay to follow up with continued symptoms. Give oxycodone for pain  I personally performed the services described in this documentation, which was scribed in my presence. The recorded information has been  reviewed and is accurate.   Glendell Docker, NP 12/20/14 [place], MD 12/25/14 2228

## 2014-12-20 NOTE — ED Notes (Signed)
Pt states that he has hx of back pain and surgery. Pt states that he has worked a lot over the last few months and has had worsening pain. Pt denies bowel or bladder changes.

## 2014-12-30 ENCOUNTER — Other Ambulatory Visit: Payer: Self-pay | Admitting: Orthopaedic Surgery

## 2014-12-30 DIAGNOSIS — M545 Low back pain: Secondary | ICD-10-CM

## 2015-01-01 ENCOUNTER — Ambulatory Visit
Admission: RE | Admit: 2015-01-01 | Discharge: 2015-01-01 | Disposition: A | Discharge status: Home / Self Care | Payer: PPO | Source: Ambulatory Visit | Attending: Orthopaedic Surgery | Admitting: Orthopaedic Surgery

## 2015-01-01 DIAGNOSIS — M545 Low back pain: Secondary | ICD-10-CM

## 2015-03-01 ENCOUNTER — Encounter (INDEPENDENT_AMBULATORY_CARE_PROVIDER_SITE_OTHER): Payer: Self-pay | Admitting: *Deleted

## 2015-04-25 ENCOUNTER — Ambulatory Visit: Payer: PPO | Admitting: Internal Medicine

## 2015-06-14 ENCOUNTER — Ambulatory Visit (INDEPENDENT_AMBULATORY_CARE_PROVIDER_SITE_OTHER): Payer: PPO | Admitting: Internal Medicine

## 2015-06-14 ENCOUNTER — Encounter: Payer: Self-pay | Admitting: Internal Medicine

## 2015-06-14 VITALS — BP 170/88 | HR 71 | Ht 68.0 in | Wt 154.3 lb

## 2015-06-14 DIAGNOSIS — I2510 Atherosclerotic heart disease of native coronary artery without angina pectoris: Secondary | ICD-10-CM | POA: Diagnosis not present

## 2015-06-14 DIAGNOSIS — Z8249 Family history of ischemic heart disease and other diseases of the circulatory system: Secondary | ICD-10-CM | POA: Diagnosis not present

## 2015-06-14 DIAGNOSIS — I158 Other secondary hypertension: Secondary | ICD-10-CM | POA: Diagnosis not present

## 2015-06-14 DIAGNOSIS — I472 Ventricular tachycardia: Secondary | ICD-10-CM | POA: Diagnosis not present

## 2015-06-14 DIAGNOSIS — I4729 Other ventricular tachycardia: Secondary | ICD-10-CM

## 2015-06-14 NOTE — Patient Instructions (Signed)
Medication Instructions:  Your physician recommends that you continue on your current medications as directed. Please refer to the Current Medication list given to you today.   Labwork: none  Testing/Procedures: none  Follow-Up: Your physician recommends that you schedule a follow-up appointment in: 2-3 weeks with Erasmo Downer in [facility].  Your physician wants you to follow-up in: 6 months with Dr. Debara Pickett. You will receive a reminder letter in the mail two months in advance. If you don't receive a letter, please call our office to schedule the follow-up appointment.   Any Other Special Instructions Will Be Listed Below (If Applicable). Your physician has requested that you regularly monitor and record your blood pressure readings at home. Please use the same machine at the same time of day to check your readings and record them to bring to your follow-up visit.     If you need a refill on your cardiac medications before your next appointment, please call your pharmacy.
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
## 2015-06-16 ENCOUNTER — Encounter (INDEPENDENT_AMBULATORY_CARE_PROVIDER_SITE_OTHER): Payer: Self-pay | Admitting: *Deleted

## 2015-06-30 ENCOUNTER — Ambulatory Visit: Payer: PPO | Admitting: Pharmacist Clinician (PhC)/ Clinical Pharmacy Specialist

## 2015-07-07 ENCOUNTER — Encounter: Payer: Self-pay | Admitting: Pharmacist Clinician (PhC)/ Clinical Pharmacy Specialist

## 2015-07-07 ENCOUNTER — Ambulatory Visit (INDEPENDENT_AMBULATORY_CARE_PROVIDER_SITE_OTHER): Payer: PPO | Admitting: Pharmacist Clinician (PhC)/ Clinical Pharmacy Specialist

## 2015-07-07 VITALS — BP 132/68 | HR 76 | Ht 68.0 in | Wt 149.4 lb

## 2015-07-07 DIAGNOSIS — I10 Essential (primary) hypertension: Secondary | ICD-10-CM

## 2015-07-07 NOTE — Assessment & Plan Note (Signed)
Angel Dixon's BP is well WNL in the office today at 132/68.  Interestingly, his home cuff actually read about 15 points higher than our office cuff.  Of note, his home cuff was too small for his arm, and I suggested that rather than replace the entire meter, see if he could get a "large" replacement cuff for his current meter.  He was encouraged to get back to his routine at the [facility] of walking and to continue with his heart healthy eating habits.  I suggested that he continue to monitor home BP readings a few times each week, and let us know if the readings start to trend upward to >150/90.

## 2015-07-07 NOTE — Patient Instructions (Signed)
Your blood pressure today is 132/68  (goal is <150/90)  Check your blood pressure at home several times each week and keep record of the readings.  Take your BP meds as follows: continue with all current medications  Bring all of your meds, your BP cuff and your record of home blood pressures to your next appointment.  Exercise as you're able, try to walk approximately 30 minutes per day.  Keep salt intake to a minimum, especially watch canned and prepared boxed foods.  Eat more fresh fruits and vegetables and fewer canned items.  Avoid eating in fast food restaurants.    HOW TO TAKE YOUR BLOOD PRESSURE: . Rest 5 minutes before taking your blood pressure. .  Don't smoke or drink caffeinated beverages for at least 30 minutes before. . Take your blood pressure before (not after) you eat. . Sit comfortably with your back supported and both feet on the floor (don't cross your legs). . Elevate your arm to heart level on a table or a desk. . Use the proper sized cuff. It should fit smoothly and snugly around your bare upper arm. There should be enough room to slip a fingertip under the cuff. The bottom edge of the cuff should be 1 inch above the crease of the elbow. . Ideally, take 3 measurements at one sitting and record the average.  See if you can find a "large" cuff to go with your meter.

## 2015-07-07 NOTE — Progress Notes (Signed)
07/07/2015 [place] 04/24/1941 [id number]   HPI:  Angel Dixon is a 75 y.o. male patient of Dr Debara Pickett, with a PMH below who presents today for hypertension clinic evaluation.  When Mr. Obarr last saw Dr. Debara Pickett on January 10, his BP in the office was elevated at 170/88 and only dropped 10 points systolic on recheck.  Patient stated that readings at home were usually WNL, so he was asked to continue his current medications, keep a daily BP log and come in today for follow up.  He presents today with a list of daily home readings, with only one showing a systolic 0000000.    Cardiac Hx: moderate CAD, nonsustained VT, DM, angina  Family Hx: 1 sister deceased from MI in her 16's, father with CAD  Social Hx: quit tobacco 27 years ago, rare alcohol, drinks decaf or caffeine free drinks  Diet:  Mostly home cooked foods, avoids fried foods when eating out, occasionally adds salt to food when cooking; oatmeal daily for breakfast  Exercise:  Previously did 2-2.5 [facility], has been slack at going this winter  Home BP readings: high 143/83; low 117/80; average of 130/81  Current antihypertensive medications: losartan 50 mg qd, metoprolol 37.5 mg bid  Intolerances: lisinopril caused sore throat, no noticed angioedema   Wt Readings from Last 3 Encounters:  07/07/15 149 lb 6.4 oz (67.767 kg)  06/14/15 154 lb 4.8 oz (69.99 kg)  12/20/14 157 lb (71.215 kg)   BP Readings from Last 3 Encounters:  07/07/15 132/68  06/14/15 170/88  12/20/14 188/91   Pulse Readings from Last 3 Encounters:  07/07/15 76  06/14/15 71  12/20/14 71    Current Outpatient Prescriptions  Medication Sig Dispense Refill  . alfuzosin (UROXATRAL) 10 MG 24 hr tablet Take 10 mg by mouth daily as needed.    Marland Kitchen aspirin EC 81 MG tablet Take 81 mg by mouth daily.      Marland Kitchen atorvastatin (LIPITOR) 40 MG tablet Take 40 mg by mouth daily.      . fluticasone (FLONASE) 50 MCG/ACT nasal spray Place 1 spray  into both nostrils as needed.  11  . glipiZIDE (GLUCOTROL) 10 MG tablet Take 10 mg by mouth 2 (two) times daily before a meal.    . losartan (COZAAR) 50 MG tablet Take 50 mg by mouth daily.  3  . metFORMIN (GLUCOPHAGE) 500 MG tablet Take 1,000 mg by mouth 2 (two) times daily with a meal.     . metoprolol tartrate (LOPRESSOR) 25 MG tablet Take 1.5 tablets (37.5 mg total) by mouth 2 (two) times daily. 270 tablet 1  . naproxen (NAPROSYN) 500 MG tablet Take 1 tablet (500 mg total) by mouth 2 (two) times daily. 30 tablet 0  . nitroGLYCERIN (NITROSTAT) 0.4 MG SL tablet Place 1 tablet (0.4 mg total) under the tongue every 5 (five) minutes as needed for chest pain (severe chest pain or pressure only). 25 tablet 2  . orphenadrine (NORFLEX) 100 MG tablet Take 1 tablet (100 mg total) by mouth 2 (two) times daily. 30 tablet 0  . pantoprazole (PROTONIX) 40 MG tablet Take 40 mg by mouth daily.      . Potassium 99 MG TABS Take by mouth daily.     No current facility-administered medications for this visit.    Allergies  Allergen Reactions  . Lisinopril Other (See Comments)    Sore throat, no obvious angioedema or cough  . Sulfa Antibiotics  Hives  . Vioxx [Rofecoxib] Hives  . Yellow Jacket Venom [Bee Venom] Swelling    Past Medical History  Diagnosis Date  . Hypertension   . Diabetes mellitus   . PUD (peptic ulcer disease)     endo 03/15/10  . 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 Dyslipidemia   . Dysrhythmia     "palpitation", PVCs  . Angina   . GERD (gastroesophageal reflux disease)   . CAD (coronary artery disease)     moderate, by cath    Blood pressure 132/68, pulse 76, height 5\' 8"  (1.727 m), weight 149 lb 6.4 oz (67.767 kg).    Tommy Medal PharmD CPP [facility]

## 2016-03-13 ENCOUNTER — Encounter (INDEPENDENT_AMBULATORY_CARE_PROVIDER_SITE_OTHER): Payer: Self-pay | Admitting: *Deleted

## 2016-03-14 ENCOUNTER — Other Ambulatory Visit (INDEPENDENT_AMBULATORY_CARE_PROVIDER_SITE_OTHER): Payer: Self-pay | Admitting: *Deleted

## 2016-03-14 DIAGNOSIS — Z86010 Personal history of colonic polyps: Secondary | ICD-10-CM

## 2016-05-09 ENCOUNTER — Encounter (INDEPENDENT_AMBULATORY_CARE_PROVIDER_SITE_OTHER): Payer: Self-pay | Admitting: *Deleted

## 2016-05-09 ENCOUNTER — Telehealth (INDEPENDENT_AMBULATORY_CARE_PROVIDER_SITE_OTHER): Payer: Self-pay | Admitting: *Deleted

## 2016-05-09 MED ORDER — PEG 3350-KCL-NA BICARB-NACL 420 G PO SOLR: 4000.0000 mL | Freq: Once | ORAL | 0 refills | Status: AC

## 2016-05-09 NOTE — Telephone Encounter (Signed)
Patient needs trilyte 

## 2016-05-14 ENCOUNTER — Telehealth (INDEPENDENT_AMBULATORY_CARE_PROVIDER_SITE_OTHER): Payer: Self-pay | Admitting: *Deleted

## 2016-05-14 NOTE — Telephone Encounter (Signed)
Referring MD/PCP: knowlton   Procedure: tcs  Reason/Indication:  Hx polyps  Has patient had this procedure before?  Yes, 2011  If so, when, by whom and where?    Is there a family history of colon cancer?  np  Who?  What age when diagnosed?    Is patient diabetic?   yes      Does patient have prosthetic heart valve or mechanical valve?  no  Do you have a pacemaker?  no  Has patient ever had endocarditis? no  Has patient had joint replacement within last 12 months?  no  Does patient tend to be constipated or take laxatives? yes  Does patient have a history of alcohol/drug use?  no  Is patient on Coumadin, Plavix and/or Aspirin? yes  Medications: see epic  Allergies: see epic  Medication Adjustment: asa 2 days, hold DM med evening before & morning of  Procedure date & time: 06/13/16 at 1200

## 2016-05-15 NOTE — Telephone Encounter (Signed)
agree

## 2016-06-13 ENCOUNTER — Encounter (HOSPITAL_COMMUNITY): Payer: Self-pay | Admitting: *Deleted

## 2016-06-13 ENCOUNTER — Ambulatory Visit (HOSPITAL_COMMUNITY)
Admission: RE | Admit: 2016-06-13 | Discharge: 2016-06-13 | Disposition: A | Discharge status: Home / Self Care | Payer: Medicare HMO | Source: Ambulatory Visit | Attending: Internal Medicine | Admitting: Internal Medicine

## 2016-06-13 ENCOUNTER — Encounter (HOSPITAL_COMMUNITY)
Admission: RE | Disposition: A | Discharge status: Home / Self Care | Payer: Self-pay | Source: Ambulatory Visit | Attending: Internal Medicine

## 2016-06-13 DIAGNOSIS — Z1211 Encounter for screening for malignant neoplasm of colon: Secondary | ICD-10-CM | POA: Diagnosis present

## 2016-06-13 DIAGNOSIS — Z79899 Other long term (current) drug therapy: Secondary | ICD-10-CM | POA: Insufficient documentation

## 2016-06-13 DIAGNOSIS — K644 Residual hemorrhoidal skin tags: Secondary | ICD-10-CM | POA: Insufficient documentation

## 2016-06-13 DIAGNOSIS — I10 Essential (primary) hypertension: Secondary | ICD-10-CM | POA: Diagnosis not present

## 2016-06-13 DIAGNOSIS — K219 Gastro-esophageal reflux disease without esophagitis: Secondary | ICD-10-CM | POA: Insufficient documentation

## 2016-06-13 DIAGNOSIS — I2510 Atherosclerotic heart disease of native coronary artery without angina pectoris: Secondary | ICD-10-CM | POA: Diagnosis not present

## 2016-06-13 DIAGNOSIS — Z7982 Long term (current) use of aspirin: Secondary | ICD-10-CM | POA: Diagnosis not present

## 2016-06-13 DIAGNOSIS — Z7984 Long term (current) use of oral hypoglycemic drugs: Secondary | ICD-10-CM | POA: Diagnosis not present

## 2016-06-13 DIAGNOSIS — Z09 Encounter for follow-up examination after completed treatment for conditions other than malignant neoplasm: Secondary | ICD-10-CM | POA: Diagnosis not present

## 2016-06-13 DIAGNOSIS — K6389 Other specified diseases of intestine: Secondary | ICD-10-CM | POA: Diagnosis not present

## 2016-06-13 DIAGNOSIS — M1990 Unspecified osteoarthritis, unspecified site: Secondary | ICD-10-CM | POA: Insufficient documentation

## 2016-06-13 DIAGNOSIS — Z860100 Personal history of colon polyps, unspecified: Secondary | ICD-10-CM | POA: Insufficient documentation

## 2016-06-13 DIAGNOSIS — K5730 Diverticulosis of large intestine without perforation or abscess without bleeding: Secondary | ICD-10-CM | POA: Insufficient documentation

## 2016-06-13 DIAGNOSIS — Z87891 Personal history of nicotine dependence: Secondary | ICD-10-CM | POA: Insufficient documentation

## 2016-06-13 DIAGNOSIS — D122 Benign neoplasm of ascending colon: Secondary | ICD-10-CM | POA: Insufficient documentation

## 2016-06-13 DIAGNOSIS — E119 Type 2 diabetes mellitus without complications: Secondary | ICD-10-CM | POA: Diagnosis not present

## 2016-06-13 DIAGNOSIS — Z8711 Personal history of peptic ulcer disease: Secondary | ICD-10-CM | POA: Insufficient documentation

## 2016-06-13 DIAGNOSIS — E785 Hyperlipidemia, unspecified: Secondary | ICD-10-CM | POA: Insufficient documentation

## 2016-06-13 HISTORY — PX: COLONOSCOPY: SHX5424

## 2016-06-13 LAB — GLUCOSE, CAPILLARY: GLUCOSE-CAPILLARY: 105 mg/dL — AB (ref 65–99)

## 2016-06-13 SURGERY — COLONOSCOPY
Anesthesia: Moderate Sedation

## 2016-06-13 MED ORDER — MIDAZOLAM HCL 5 MG/5ML IJ SOLN
INTRAMUSCULAR | Status: AC
  Filled 2016-06-13: qty 10

## 2016-06-13 MED ORDER — IBUPROFEN 800 MG PO TABS: 400.0000 mg | ORAL_TABLET | Freq: Three times a day (TID) | ORAL | 3 refills | Status: DC | PRN

## 2016-06-13 MED ORDER — MEPERIDINE HCL 50 MG/ML IJ SOLN
INTRAMUSCULAR | Status: AC
  Filled 2016-06-13: qty 1

## 2016-06-13 MED ORDER — SODIUM CHLORIDE 0.9 % IV SOLN
INTRAVENOUS | Status: DC
  Administered 2016-06-13: 1000 mL via INTRAVENOUS

## 2016-06-13 MED ORDER — STERILE WATER FOR IRRIGATION IR SOLN
Status: DC | PRN
  Administered 2016-06-13: 12:00:00

## 2016-06-13 MED ORDER — MEPERIDINE HCL 50 MG/ML IJ SOLN
INTRAMUSCULAR | Status: DC | PRN
  Administered 2016-06-13 (×2): 25 mg via INTRAVENOUS

## 2016-06-13 MED ORDER — MIDAZOLAM HCL 5 MG/5ML IJ SOLN
INTRAMUSCULAR | Status: DC | PRN
  Administered 2016-06-13: 1 mg via INTRAVENOUS
  Administered 2016-06-13: 2 mg via INTRAVENOUS

## 2016-06-13 NOTE — OR Nursing (Signed)
Earring removed and placed in bag and given to [employer], daughter.  Wedding band given to [employer] and she gave it to [employer], wife

## 2016-06-13 NOTE — H&P (Signed)
Angel Dixon is an 76 y.o. male.   Chief Complaint: Patient is here for colonoscopy. HPI: Patient is 76 year old African-American male who was history of colonic adenomas and is here for surveillance colonoscopy. He denies abdominal pain change in bowel habits or rectal bleeding. Last colonoscopy was in October 2011 with removal of 2 polyps and these are adenomas. Family history is negative for CRC. Aspirin is on hold. He states he takes ibuprofen occasionally.  Past Medical History:  Diagnosis Date  . Angina   . CAD (coronary artery disease)    moderate, by cath  . Diabetes mellitus   . DJD (degenerative joint disease)    lumbar lam 4/05  . Dyslipidemia   . Dysrhythmia    "palpitation", PVCs  . GERD (gastroesophageal reflux disease)   . Hypertension   . PUD (peptic ulcer disease)    endo 03/15/10    Past Surgical History:  Procedure Laterality Date  . BACK SURGERY  2005  . CARDIAC CATHETERIZATION  02/22/2006   mild obs. CAD 60-70% narrowing in small inferior branch of 1st diagonal of LAD, smooth 10% narrowing of mid RCA (Dr. Corky Downs)  . 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 Marella Chimes)  . CARDIAC CATHETERIZATION  05/11/2011   nonobstructive CAD (Dr. Roni Bread)  . HERNIA REPAIR    . [place]   right  . LEFT HEART CATHETERIZATION WITH CORONARY ANGIOGRAM N/A 05/14/2011   Procedure: LEFT HEART CATHETERIZATION WITH CORONARY ANGIOGRAM;  Surgeon: [name], MD;  Location: [facility] CATH LAB;  Service: Cardiovascular;  Laterality: N/A;  Coronary angiogram and possible PCI  . LUMBAR LAMINECTOMY  09/2003   L2-3, 3-4, 4-5  . NM MYOCAR PERF WALL MOTION  10/03/2009   bruce myoview - normal perfusion in all regions, EF 70%, no ischemia, low risk  . TRANSTHORACIC ECHOCARDIOGRAM  03/28/2012   EF Q000111Q, grade 1 diastolic dysfunction, mod AV calcification, mild MR    Family History   Problem Relation Age of Onset  . Coronary artery disease Father     died 37  . Kidney failure Mother     died 74  . Sarcoidosis Daughter     died 32  . Heart attack Sister     died of MI in her 37s   Social History:  reports that he has quit smoking. His smoking use included Cigarettes. He has a 25.00 pack-year smoking history. He has never used smokeless tobacco. He reports that he drinks alcohol. He reports that he does not use drugs.  Allergies:  Allergies  Allergen Reactions  . Lisinopril Other (See Comments)    Sore throat, no obvious angioedema or cough  . Sulfa Antibiotics Hives  . Vioxx [Rofecoxib] Hives  . Yellow Jacket Venom [Bee Venom] Swelling    Medications Prior to Admission  Medication Sig Dispense Refill  . aspirin EC 81 MG tablet Take 81 mg by mouth daily.      Marland Kitchen atorvastatin (LIPITOR) 40 MG tablet Take 40 mg by mouth daily.      . fluticasone (FLONASE) 50 MCG/ACT nasal spray Place 1 spray into both nostrils as needed for allergies or rhinitis.   11  . glipiZIDE (GLUCOTROL XL) 10 MG 24 hr tablet Take 10 mg by mouth daily with breakfast.    . metFORMIN (GLUCOPHAGE) 500 MG tablet Take 500 mg by mouth 2 (two) times daily.    . metoprolol tartrate (  LOPRESSOR) 25 MG tablet Take 1.5 tablets (37.5 mg total) by mouth 2 (two) times daily. (Patient taking differently: Take 37.5 mg by mouth daily. ) 270 tablet 1  . nitroGLYCERIN (NITROSTAT) 0.4 MG SL tablet Place 1 tablet (0.4 mg total) under the tongue every 5 (five) minutes as needed for chest pain (severe chest pain or pressure only). 25 tablet 2  . pantoprazole (PROTONIX) 40 MG tablet Take 40 mg by mouth daily.      . Polyethylene Glycol 400 (BLINK TEARS OP) Apply 1 drop to eye 2 (two) times daily.    Marland Kitchen alfuzosin (UROXATRAL) 10 MG 24 hr tablet Take 10 mg by mouth daily.     . Ascorbic Acid (VITAMIN C PO) Take 1 tablet by mouth daily as needed (immune support).    Marland Kitchen ibuprofen (ADVIL,MOTRIN) 800 MG tablet Take 800 mg by  mouth 3 (three) times daily as needed for pain.  3    Results for orders placed or performed during the hospital encounter of 06/13/16 (from the past 48 hour(s))  Glucose, capillary     Status: Abnormal   Collection Time: 06/13/16 11:30 AM  Result Value Ref Range   Glucose-Capillary 105 (H) 65 - 99 mg/dL   No results found.  ROS  Blood pressure (!) 146/87, pulse 69, temperature 98 F (36.7 C), temperature source Oral, resp. rate 14, height 5\' 8"  (1.727 m), weight 144 lb (65.3 kg), SpO2 100 %. Physical Exam  Constitutional: He appears well-developed and well-nourished.  HENT:  Mouth/Throat: Oropharynx is clear and moist.  Eyes: Conjunctivae are normal. No scleral icterus.  Neck: No thyromegaly present.  Cardiovascular: Normal rate, regular rhythm and normal heart sounds.   No murmur heard. Respiratory: Effort normal and breath sounds normal.  GI: Soft. He exhibits no distension and no mass. There is no tenderness.  Musculoskeletal: He exhibits no edema.  Lymphadenopathy:    He has no cervical adenopathy.  Neurological: He is alert.  Skin: Skin is warm and dry.     Assessment/Plan History of colonic adenomas. Surveillance colonoscopy.  Hildred Laser, MD 06/13/2016, 11:48 AM

## 2016-06-13 NOTE — Discharge Instructions (Signed)
No aspirin or NSAIDs for 1 week. Resume other medications and diet as before. No driving for 24 hours. Physician will call with biopsy results.       Colonoscopy, Adult, Care After This sheet gives you information about how to care for yourself after your procedure. Your doctor may also give you more specific instructions. If you have problems or questions, call your doctor. Follow these instructions at home: General instructions  For the first 24 hours after the procedure:  Do not drive or use machinery.  Do not sign important documents.  Do not drink alcohol.  Do your daily activities more slowly than normal.  Eat foods that are soft and easy to digest.  Rest often.  Take over-the-counter or prescription medicines only as told by your doctor.  It is up to you to get the results of your procedure. Ask your doctor, or the department performing the procedure, when your results will be ready. To help cramping and bloating:  Try walking around.  Put heat on your belly (abdomen) as told by your doctor. Use a heat source that your doctor recommends, such as a moist heat pack or a heating pad.  Put a towel between your skin and the heat source.  Leave the heat on for 20-30 minutes.  Remove the heat if your skin turns bright red. This is especially important if you cannot feel pain, heat, or cold. You can get burned. Eating and drinking  Drink enough fluid to keep your pee (urine) clear or pale yellow.  Return to your normal diet as told by your doctor. Avoid heavy or fried foods that are hard to digest.  Avoid drinking alcohol for as long as told by your doctor. Contact a doctor if:  You have blood in your poop (stool) 2-3 days after the procedure. Get help right away if:  You have more than a small amount of blood in your poop.  You see large clumps of tissue (blood clots) in your poop.  Your belly is swollen.  You feel sick to your stomach (nauseous).  You  throw up (vomit).  You have a fever.  You have belly pain that gets worse, and medicine does not help your pain. This information is not intended to replace advice given to you by your health care provider. Make sure you discuss any questions you have with your health care provider. Document Released: 06/23/2010 Document Revised: 02/13/2016 Document Reviewed: 02/13/2016 Elsevier Interactive Patient Education  2017 [place].    Colon Polyps Introduction Polyps are tissue growths inside the body. Polyps can grow in many places, including the large intestine (colon). A polyp may be a round bump or a mushroom-shaped growth. You could have one polyp or several. Most colon polyps are noncancerous (benign). However, some colon polyps can become cancerous over time. What are the causes? The exact cause of colon polyps is not known. What increases the risk? This condition is more likely to develop in people who:  Have a family history of colon cancer or colon polyps.  Are older than 68 or older than 45 if they are African American.  Have inflammatory bowel disease, such as ulcerative colitis or Crohn disease.  Are overweight.  Smoke cigarettes.  Do not get enough exercise.  Drink too much alcohol.  Eat a diet that is:  High in fat and red meat.  Low in fiber.  Had childhood cancer that was treated with abdominal radiation. What are the signs or symptoms? Most  polyps do not cause symptoms. If you have symptoms, they may include:  Blood coming from your rectum when having a bowel movement.  Blood in your stool.The stool may look dark red or black.  A change in bowel habits, such as constipation or diarrhea. How is this diagnosed? This condition is diagnosed with a colonoscopy. This is a procedure that uses a lighted, flexible scope to look at the inside of your colon. How is this treated? Treatment for this condition involves removing any polyps that are found. Those  polyps will then be tested for cancer. If cancer is found, your health care provider will talk to you about options for colon cancer treatment. Follow these instructions at home: Diet  Eat plenty of fiber, such as fruits, vegetables, and whole grains.  Eat foods that are high in calcium and vitamin D, such as milk, cheese, yogurt, eggs, liver, fish, and broccoli.  Limit foods high in fat, red meats, and processed meats, such as hot dogs, sausage, bacon, and lunch meats.  Maintain a healthy weight, or lose weight if recommended by your health care provider. General instructions  Do not smoke cigarettes.  Do not drink alcohol excessively.  Keep all follow-up visits as told by your health care provider. This is important. This includes keeping regularly scheduled colonoscopies. Talk to your health care provider about when you need a colonoscopy.  Exercise every day or as told by your health care provider. Contact a health care provider if:  You have new or worsening bleeding during a bowel movement.  You have new or increased blood in your stool.  You have a change in bowel habits.  You unexpectedly lose weight. This information is not intended to replace advice given to you by your health care provider. Make sure you discuss any questions you have with your health care provider. Document Released: 02/15/2004 Document Revised: 10/27/2015 Document Reviewed: 04/11/2015  2017 Elsevier    Hemorrhoids Hemorrhoids are swollen veins in and around the rectum or anus. There are two types of hemorrhoids:  Internal hemorrhoids. These occur in the veins that are just inside the rectum. They may poke through to the outside and become irritated and painful.  External hemorrhoids. These occur in the veins that are outside of the anus and can be felt as a painful swelling or hard lump near the anus. Most hemorrhoids do not cause serious problems, and they can be managed with home treatments  such as diet and lifestyle changes. If home treatments do not help your symptoms, procedures can be done to shrink or remove the hemorrhoids. What are the causes? This condition is caused by increased pressure in the anal area. This pressure may result from various things, including:  Constipation.  Straining to have a bowel movement.  Diarrhea.  Pregnancy.  Obesity.  Sitting for long periods of time.  Heavy lifting or other activity that causes you to strain.  Anal sex. What are the signs or symptoms? Symptoms of this condition include:  Pain.  Anal itching or irritation.  Rectal bleeding.  Leakage of stool (feces).  Anal swelling.  One or more lumps around the anus. How is this diagnosed? This condition can often be diagnosed through a visual exam. Other exams or tests may also be done, such as:  Examination of the rectal area with a gloved hand (digital rectal exam).  Examination of the anal canal using a small tube (anoscope).  A blood test, if you have lost a significant amount  of blood.  A test to look inside the colon (sigmoidoscopy or colonoscopy). How is this treated? This condition can usually be treated at home. However, various procedures may be done if dietary changes, lifestyle changes, and other home treatments do not help your symptoms. These procedures can help make the hemorrhoids smaller or remove them completely. Some of these procedures involve surgery, and others do not. Common procedures include:  Rubber band ligation. Rubber bands are placed at the base of the hemorrhoids to cut off the blood supply to them.  Sclerotherapy. Medicine is injected into the hemorrhoids to shrink them.  Infrared coagulation. A type of light energy is used to get rid of the hemorrhoids.  Hemorrhoidectomy surgery. The hemorrhoids are surgically removed, and the veins that supply them are tied off.  Stapled hemorrhoidopexy surgery. A circular stapling device is  used to remove the hemorrhoids and use staples to cut off the blood supply to them. Follow these instructions at home: Eating and drinking  Eat foods that have a lot of fiber in them, such as whole grains, beans, nuts, fruits, and vegetables. Ask your health care provider about taking products that have added fiber (fiber supplements).  Drink enough fluid to keep your urine clear or pale yellow. Managing pain and swelling  Take warm sitz baths for 20 minutes, 3-4 times a day to ease pain and discomfort.  If directed, apply ice to the affected area. Using ice packs between sitz baths may be helpful.  Put ice in a plastic bag.  Place a towel between your skin and the bag.  Leave the ice on for 20 minutes, 2-3 times a day. General instructions  Take over-the-counter and prescription medicines only as told by your health care provider.  Use medicated creams or suppositories as told.  Exercise regularly.  Go to the bathroom when you have the urge to have a bowel movement. Do not wait.  Avoid straining to have bowel movements.  Keep the anal area dry and clean. Use wet toilet paper or moist towelettes after a bowel movement.  Do not sit on the toilet for long periods of time. This increases blood pooling and pain. Contact a health care provider if:  You have increasing pain and swelling that are not controlled by treatment or medicine.  You have uncontrolled bleeding.  You have difficulty having a bowel movement, or you are unable to have a bowel movement.  You have pain or inflammation outside the area of the hemorrhoids. This information is not intended to replace advice given to you by your health care provider. Make sure you discuss any questions you have with your health care provider. Document Released: 05/18/2000 Document Revised: 10/19/2015 Document Reviewed: 02/02/2015 Elsevier Interactive Patient Education  2017 [employer]    Diverticulosis Diverticulosis is  the condition that develops when small pouches (diverticula) form in the wall of your colon. Your colon, or large intestine, is where water is absorbed and stool is formed. The pouches form when the inside layer of your colon pushes through weak spots in the outer layers of your colon. CAUSES  No one knows exactly what causes diverticulosis. RISK FACTORS  Being older than 56. Your risk for this condition increases with age. Diverticulosis is rare in people younger than 40 years. By age 107, almost everyone has it.  Eating a low-fiber diet.  Being frequently constipated.  Being overweight.  Not getting enough exercise.  Smoking.  Taking over-the-counter pain medicines, like aspirin and ibuprofen. SYMPTOMS  Most people with diverticulosis do not have symptoms. DIAGNOSIS  Because diverticulosis often has no symptoms, health care providers often discover the condition during an exam for other colon problems. In many cases, a health care provider will diagnose diverticulosis while using a flexible scope to examine the colon (colonoscopy). TREATMENT  If you have never developed an infection related to diverticulosis, you may not need treatment. If you have had an infection before, treatment may include:  Eating more fruits, vegetables, and grains.  Taking a fiber supplement.  Taking a live bacteria supplement (probiotic).  Taking medicine to relax your colon. HOME CARE INSTRUCTIONS   Drink at least 6-8 glasses of water each day to prevent constipation.  Try not to strain when you have a bowel movement.  Keep all follow-up appointments. If you have had an infection before:  Increase the fiber in your diet as directed by your health care provider or dietitian.  Take a dietary fiber supplement if your health care provider approves.  Only take medicines as directed by your health care provider. SEEK MEDICAL CARE IF:   You have abdominal pain.  You have bloating.  You have  cramps.  You have not gone to the bathroom in 3 days. SEEK IMMEDIATE MEDICAL CARE IF:   Your pain gets worse.  Yourbloating becomes very bad.  You have a fever or chills, and your symptoms suddenly get worse.  You begin vomiting.  You have bowel movements that are bloody or black. MAKE SURE YOU:  Understand these instructions.  Will watch your condition.  Will get help right away if you are not doing well or get worse. This information is not intended to replace advice given to you by your health care provider. Make sure you discuss any questions you have with your health care provider. Document Released: 02/16/2004 Document Revised: 05/26/2013 Document Reviewed: 04/15/2013 Elsevier Interactive Patient Education  2017 [employer].

## 2016-06-13 NOTE — Op Note (Signed)
[facility] Patient Name: Angel Dixon Procedure Date: 06/13/2016 11:48 AM MRN: [id number] Date of Birth: [birth year] Attending MD: Hildred Laser , MD CSN: [id number] Age: 76 Admit Type: Outpatient Procedure:                Colonoscopy Indications:              High risk colon cancer surveillance: Personal                            history of colonic polyps Providers:                Hildred Laser, MD, Otis Peak B. Sharon Seller, RN, Sherlyn Lees, Technician Referring MD:             Lemmie Evens, MD Medicines:                Meperidine 50 mg IV, Midazolam 3 mg IV Complications:            No immediate complications. Estimated Blood Loss:     Estimated blood loss: none. Procedure:                Pre-Anesthesia Assessment:                           - Prior to the procedure, a History and Physical                            was performed, and patient medications and                            allergies were reviewed. The patient's tolerance of                            previous anesthesia was also reviewed. The risks                            and benefits of the procedure and the sedation                            options and risks were discussed with the patient.                            All questions were answered, and informed consent                            was obtained. Prior Anticoagulants: The patient                            last took aspirin 2 days prior to the procedure.                            ASA Grade Assessment: III - A patient with severe  systemic disease. After reviewing the risks and                            benefits, the patient was deemed in satisfactory                            condition to undergo the procedure.                           After obtaining informed consent, the colonoscope                            was passed under direct vision. Throughout the   procedure, the patient's blood pressure, pulse, and                            oxygen saturations were monitored continuously. The                            [id number]) was introduced through the                            anus and advanced to the the cecum, identified by                            appendiceal orifice and ileocecal valve. The                            colonoscopy was technically difficult and complex                            due to significant looping. Successful completion                            of the procedure was aided by changing the                            patient's position and applying abdominal pressure.                            The patient tolerated the procedure well. The                            quality of the bowel preparation was adequate. The                            ileocecal valve, appendiceal orifice, and rectum                            were photographed. Scope In: [id number] AM Scope Out: 12:35:12 PM Scope Withdrawal Time: 0 hours 16 minutes 10 seconds  Total Procedure Duration: 0 hours 38 minutes 14 seconds  Findings:      The perianal and digital rectal examinations were normal.      A diffuse area of mild  melanosis was found in the entire colon.      A 8 mm polyp was found in the ascending colon. The polyp was sessile.       The polyp was removed with a hot snare. Resection and retrieval were       complete. To prevent bleeding after the polypectomy, one hemostatic clip       was successfully placed (MR conditional). There was no bleeding during,       or at the end, of the procedure.      A single small-mouthed diverticulum was found in the sigmoid colon.      External hemorrhoids were found during retroflexion. The hemorrhoids       were small. Impression:               - Melanosis in the colon.                           - One 8 mm polyp in the ascending colon, removed                            with a hot snare.  Resected and retrieved. Clip (MR                            conditional) was placed.                           - Diverticulosis in the sigmoid colon.                           - External hemorrhoids. Moderate Sedation:      Moderate (conscious) sedation was administered by the endoscopy nurse       and supervised by the endoscopist. The following parameters were       monitored: oxygen saturation, heart rate, blood pressure, CO2       capnography and response to care. Total physician intraservice time was       42 minutes. Recommendation:           - Patient has a contact number available for                            emergencies. The signs and symptoms of potential                            delayed complications were discussed with the                            patient. Return to normal activities tomorrow.                            Written discharge instructions were provided to the                            patient.                           - High fiber diet today.                           -  Resume previous diet.                           - No aspirin, ibuprofen, naproxen, or other                            non-steroidal anti-inflammatory drugs for 1 week                            after polyp removal.                           - Await pathology results.                           - Repeat colonoscopy for surveillance based on                            pathology results. Procedure Code(s):        --- Professional ---                           [phone], Colonoscopy, flexible; with removal of                            tumor(s), polyp(s), or other lesion(s) by snare                            technique                           99152, Moderate sedation services provided by the                            same physician or other qualified health care                            professional performing the diagnostic or                            therapeutic service that the sedation  supports,                            requiring the presence of an independent trained                            observer to assist in the monitoring of the                            patient's level of consciousness and physiological                            status; initial 15 minutes of intraservice time,                            patient age 57 years or older  [id number], Moderate sedation services; each additional                            15 minutes intraservice time                           99153, Moderate sedation services; each additional                            15 minutes intraservice time Diagnosis Code(s):        --- Professional ---                           Z86.010, Personal history of colonic polyps                           K63.89, Other specified diseases of intestine                           D12.2, Benign neoplasm of ascending colon                           K64.4, Residual hemorrhoidal skin tags                           K57.30, Diverticulosis of large intestine without                            perforation or abscess without bleeding CPT copyright 2016 [employer]. All rights reserved. The codes documented in this report are preliminary and upon coder review may  be revised to meet current compliance requirements. Hildred Laser, MD Hildred Laser, MD 06/13/2016 12:43:53 PM This report has been signed electronically. Number of Addenda: 0

## 2016-06-14 ENCOUNTER — Encounter (HOSPITAL_COMMUNITY): Payer: Self-pay | Admitting: Internal Medicine

## 2016-06-15 ENCOUNTER — Other Ambulatory Visit (HOSPITAL_COMMUNITY): Payer: Self-pay | Admitting: Family Medicine

## 2016-06-15 DIAGNOSIS — R109 Unspecified abdominal pain: Secondary | ICD-10-CM

## 2016-06-19 ENCOUNTER — Ambulatory Visit (HOSPITAL_COMMUNITY)
Admission: RE | Admit: 2016-06-19 | Discharge: 2016-06-19 | Disposition: A | Discharge status: Home / Self Care | Payer: Medicare HMO | Source: Ambulatory Visit | Attending: Family Medicine | Admitting: Family Medicine

## 2016-06-19 DIAGNOSIS — N281 Cyst of kidney, acquired: Secondary | ICD-10-CM | POA: Insufficient documentation

## 2016-06-19 DIAGNOSIS — N400 Enlarged prostate without lower urinary tract symptoms: Secondary | ICD-10-CM | POA: Insufficient documentation

## 2016-06-19 DIAGNOSIS — R109 Unspecified abdominal pain: Secondary | ICD-10-CM | POA: Diagnosis present

## 2016-07-02 ENCOUNTER — Other Ambulatory Visit (HOSPITAL_COMMUNITY): Payer: Self-pay | Admitting: Family Medicine

## 2016-07-02 DIAGNOSIS — N281 Cyst of kidney, acquired: Secondary | ICD-10-CM

## 2016-07-03 ENCOUNTER — Ambulatory Visit (HOSPITAL_COMMUNITY)
Admission: RE | Admit: 2016-07-03 | Discharge: 2016-07-03 | Disposition: A | Discharge status: Home / Self Care | Payer: Medicare HMO | Source: Ambulatory Visit | Attending: Family Medicine | Admitting: Family Medicine

## 2016-07-03 DIAGNOSIS — N281 Cyst of kidney, acquired: Secondary | ICD-10-CM | POA: Diagnosis present

## 2016-07-03 LAB — POCT I-STAT CREATININE: Creatinine, Ser: 1.3 mg/dL — ABNORMAL HIGH (ref 0.61–1.24)

## 2016-07-03 MED ORDER — GADOBENATE DIMEGLUMINE 529 MG/ML IV SOLN
13.0000 mL | Freq: Once | INTRAVENOUS | Status: AC | PRN
  Administered 2016-07-03: 13 mL via INTRAVENOUS

## 2016-07-03 MED ORDER — SODIUM CHLORIDE 0.9% FLUSH
INTRAVENOUS | Status: AC
  Filled 2016-07-03: qty 10

## 2016-07-03 MED ORDER — SODIUM CHLORIDE 0.9 % IV SOLN
INTRAVENOUS | Status: AC
  Filled 2016-07-03: qty 50

## 2016-07-04 ENCOUNTER — Encounter: Payer: Self-pay | Admitting: Internal Medicine

## 2016-07-06 ENCOUNTER — Encounter (HOSPITAL_COMMUNITY): Payer: Self-pay | Admitting: *Deleted

## 2016-07-06 ENCOUNTER — Emergency Department (HOSPITAL_COMMUNITY): Payer: Medicare HMO

## 2016-07-06 ENCOUNTER — Observation Stay (HOSPITAL_COMMUNITY)
Admission: EM | Admit: 2016-07-06 | Discharge: 2016-07-07 | Disposition: A | Discharge status: Home / Self Care | Payer: Medicare HMO | Attending: Family Medicine | Admitting: Family Medicine

## 2016-07-06 DIAGNOSIS — R0789 Other chest pain: Secondary | ICD-10-CM | POA: Diagnosis not present

## 2016-07-06 DIAGNOSIS — Z7984 Long term (current) use of oral hypoglycemic drugs: Secondary | ICD-10-CM | POA: Diagnosis not present

## 2016-07-06 DIAGNOSIS — R0602 Shortness of breath: Secondary | ICD-10-CM | POA: Diagnosis not present

## 2016-07-06 DIAGNOSIS — E785 Hyperlipidemia, unspecified: Secondary | ICD-10-CM | POA: Diagnosis not present

## 2016-07-06 DIAGNOSIS — I2510 Atherosclerotic heart disease of native coronary artery without angina pectoris: Secondary | ICD-10-CM | POA: Diagnosis not present

## 2016-07-06 DIAGNOSIS — Z7982 Long term (current) use of aspirin: Secondary | ICD-10-CM | POA: Diagnosis not present

## 2016-07-06 DIAGNOSIS — R079 Chest pain, unspecified: Secondary | ICD-10-CM | POA: Diagnosis present

## 2016-07-06 DIAGNOSIS — I10 Essential (primary) hypertension: Secondary | ICD-10-CM | POA: Diagnosis present

## 2016-07-06 DIAGNOSIS — Z79899 Other long term (current) drug therapy: Secondary | ICD-10-CM | POA: Insufficient documentation

## 2016-07-06 DIAGNOSIS — E119 Type 2 diabetes mellitus without complications: Secondary | ICD-10-CM | POA: Diagnosis not present

## 2016-07-06 DIAGNOSIS — Z87891 Personal history of nicotine dependence: Secondary | ICD-10-CM | POA: Diagnosis not present

## 2016-07-06 LAB — TROPONIN I: Troponin I: <0.03 ng/mL (ref <0.03)

## 2016-07-06 LAB — CBC
HEMATOCRIT: 36.2 % — AB (ref 39.0–52.0)
Hemoglobin: 12.2 g/dL — ABNORMAL LOW (ref 13.0–17.0)
MCH: 28.1 pg (ref 26.0–34.0)
MCHC: 33.7 g/dL (ref 30.0–36.0)
MCV: 83.4 fL (ref 78.0–100.0)
Platelets: 307 10*3/uL (ref 150–400)
RBC: 4.34 MIL/uL (ref 4.22–5.81)
RDW: 12.7 % (ref 11.5–15.5)
WBC: 5.1 10*3/uL (ref 4.0–10.5)

## 2016-07-06 LAB — BRAIN NATRIURETIC PEPTIDE: B Natriuretic Peptide: 11 pg/mL (ref 0.0–100.0)

## 2016-07-06 LAB — BASIC METABOLIC PANEL
Anion gap: 10 (ref 5–15)
BUN: 12 mg/dL (ref 6–20)
CO2: 28 mmol/L (ref 22–32)
Calcium: 9.4 mg/dL (ref 8.9–10.3)
Chloride: 100 mmol/L — ABNORMAL LOW (ref 101–111)
Creatinine, Ser: 1.03 mg/dL (ref 0.61–1.24)
GFR calc Af Amer: >60 mL/min (ref >60)
GLUCOSE: 117 mg/dL — AB (ref 65–99)
POTASSIUM: 3.9 mmol/L (ref 3.5–5.1)
Sodium: 138 mmol/L (ref 135–145)

## 2016-07-06 MED ORDER — ASPIRIN 81 MG PO CHEW
324.0000 mg | CHEWABLE_TABLET | Freq: Once | ORAL | Status: AC
  Administered 2016-07-06: 324 mg via ORAL
  Filled 2016-07-06: qty 4

## 2016-07-06 MED ORDER — NITROGLYCERIN 0.4 MG SL SUBL
0.4000 mg | SUBLINGUAL_TABLET | SUBLINGUAL | Status: DC | PRN
  Filled 2016-07-06: qty 1

## 2016-07-06 NOTE — ED Provider Notes (Signed)
[facility] DEPT Provider Note   CSN: [id number] Arrival date & time: 07/06/16  2056  By signing my name below, I, Gwenlyn Fudge, attest that this documentation has been prepared under the direction and in the presence of Julianne Rice, MD. Electronically Signed: Gwenlyn Fudge, ED Scribe. 07/06/16. 10:15 PM.   History   Chief Complaint Chief Complaint  Patient presents with  . Chest Pain   The history is provided by the patient. No language interpreter was used.    HPI Comments: Shalin GASPARD WILDER is a 76 y.o. male with PMHx of CAD, DM, HTN and GERD who presents to the Emergency Department complaining of gradual onset, intermittent, generalized chest tightness for 3 days. Pt last experienced this chest pain approximately 1 hour ago. Pain is associated with exertion and radiates to left arm. He has associated shortness of breath, without cough. Shortness of breath is not brought on by lying down flat. His last cardiac catheterization was 3 years ago with 65% blockage on a smaller artery that was not enough to stent. He takes 81 mg of Aspirin daily and has Nitroglycerin at home for use as needed. Pt denies fever. Pt was unable to schedule an appointment until 08/07/2016 with cardiologist.    Past Medical History:  Diagnosis Date  . Angina   . CAD (coronary artery disease)    moderate, by cath  . Diabetes mellitus   . DJD (degenerative joint disease)    lumbar lam 4/05  . Dyslipidemia   . Dysrhythmia    "palpitation", PVCs  . GERD (gastroesophageal reflux disease)   . Hypertension   . PUD (peptic ulcer disease)    endo 03/15/10    Patient Active Problem List   Diagnosis Date Noted  . History of colonic polyps 03/14/2016  . NSVT (nonsustained ventricular tachycardia) ([place]) 04/02/2014  . Chest pain 03/28/2012  . Palpitations 05/13/2011  . 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 Acute coronary syndrome ([place]) 05/11/2011  . CAD (coronary artery disease), small Dx branch occl 2009  05/11/2011  . Diabetes mellitus ([place]) 05/11/2011  . HTN (hypertension) 05/11/2011  . Dyslipidemia 05/11/2011  . Family history of early CAD 05/11/2011    Past Surgical History:  Procedure Laterality Date  . BACK SURGERY  2005  . CARDIAC CATHETERIZATION  02/22/2006   mild obs. CAD 60-70% narrowing in small inferior branch of 1st diagonal of LAD, smooth 10% narrowing of mid RCA (Dr. Corky Downs)  . 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 Marella Chimes)  . CARDIAC CATHETERIZATION  05/11/2011   nonobstructive CAD (Dr. Roni Bread)  . COLONOSCOPY N/A 06/13/2016   Procedure: COLONOSCOPY;  Surgeon: Rogene Houston, MD;  Location: [facility];  Service: Endoscopy;  Laterality: N/A;  1200  . HERNIA REPAIR    . [place]   right  . LEFT HEART CATHETERIZATION WITH CORONARY ANGIOGRAM N/A 05/14/2011   Procedure: LEFT HEART CATHETERIZATION WITH CORONARY ANGIOGRAM;  Surgeon: [name], MD;  Location: [facility] CATH LAB;  Service: Cardiovascular;  Laterality: N/A;  Coronary angiogram and possible PCI  . LUMBAR LAMINECTOMY  09/2003   L2-3, 3-4, 4-5  . NM MYOCAR PERF WALL MOTION  10/03/2009   bruce myoview - normal perfusion in all regions, EF 70%, no ischemia, low risk  . TRANSTHORACIC ECHOCARDIOGRAM  03/28/2012   EF Q000111Q, grade 1 diastolic dysfunction, mod AV calcification, mild MR       Home Medications  Prior to Admission medications   Medication Sig Start Date End Date Taking? Authorizing Provider  alfuzosin (UROXATRAL) 10 MG 24 hr tablet Take 10 mg by mouth daily.  12/11/12  Yes Historical Provider, MD  Ascorbic Acid (VITAMIN C PO) Take 1 tablet by mouth daily as needed (immune support).   Yes Historical Provider, MD  aspirin EC 81 MG tablet Take 1 tablet (81 mg total) by mouth daily. 06/20/16  Yes Rogene Houston, MD  atorvastatin (LIPITOR) 40 MG tablet Take 40 mg by mouth daily.     Yes Historical  Provider, MD  glipiZIDE (GLUCOTROL XL) 10 MG 24 hr tablet Take 10 mg by mouth daily with breakfast.   Yes Historical Provider, MD  ibuprofen (ADVIL,MOTRIN) 800 MG tablet Take 0.5 tablets (400 mg total) by mouth 3 (three) times daily as needed. 06/13/16  Yes Rogene Houston, MD  levofloxacin (LEVAQUIN) 500 MG tablet Take 500 mg by mouth daily. 10 day course starting on 06/27/2016 06/27/16  Yes Historical Provider, MD  metFORMIN (GLUCOPHAGE) 500 MG tablet Take 500 mg by mouth 2 (two) times daily.   Yes Historical Provider, MD  metoprolol tartrate (LOPRESSOR) 25 MG tablet Take 1.5 tablets (37.5 mg total) by mouth 2 (two) times daily. Patient taking differently: Take 25 mg by mouth daily.  02/25/14  Yes Pixie Casino, MD  pantoprazole (PROTONIX) 40 MG tablet Take 40 mg by mouth daily.     Yes Historical Provider, MD  Polyethylene Glycol 400 (BLINK TEARS OP) Apply 1 drop to eye 2 (two) times daily.   Yes Historical Provider, MD  fluticasone (FLONASE) 50 MCG/ACT nasal spray Place 1 spray into both nostrils as needed for allergies or rhinitis.  09/20/14   Historical Provider, MD  nitroGLYCERIN (NITROSTAT) 0.4 MG SL tablet Place 1 tablet (0.4 mg total) under the tongue every 5 (five) minutes as needed for chest pain (severe chest pain or pressure only). 05/14/11 06/06/16  Erlene Quan, PA-C    Family History Family History  Problem Relation Age of Onset  . Coronary artery disease Father     died 60  . Kidney failure Mother     died 8  . Sarcoidosis Daughter     died 71  . Heart attack Sister     died of MI in her 45s    Social History Social History  Substance Use Topics  . Smoking status: Former Smoker    Packs/day: 1.00    Years: 25.00    Types: Cigarettes  . Smokeless tobacco: Never Used     Comment: quit smoking cigarettes ~ 1990  . Alcohol use Yes     Comment: 05/11/11 "shot every now and then"     Allergies   Lisinopril; Sulfa antibiotics; Vioxx [rofecoxib]; and Yellow jacket  venom [bee venom]   Review of Systems Review of Systems  Constitutional: Negative for chills, fatigue and fever.  HENT: Negative for sore throat, trouble swallowing and voice change.   Respiratory: Positive for chest tightness and shortness of breath. Negative for cough and wheezing.   Cardiovascular: Positive for chest pain. Negative for palpitations and leg swelling.  Gastrointestinal: Negative for abdominal pain, diarrhea, nausea and vomiting.  Musculoskeletal: Negative for back pain, myalgias, neck pain and neck stiffness.  Skin: Negative for rash and wound.  Neurological: Negative for dizziness, weakness, light-headedness, numbness and headaches.  All other systems reviewed and are negative.   Physical Exam Updated Vital Signs BP 144/96   Pulse 65  Temp 97.7 F (36.5 C)   Resp 12   Ht 5\' 8"  (1.727 m)   Wt 145 lb (65.8 kg)   SpO2 97%   BMI 22.05 kg/m   Physical Exam  Constitutional: He is oriented to person, place, and time. He appears well-developed and well-nourished. No distress.  HENT:  Head: Normocephalic and atraumatic.  Mouth/Throat: Oropharynx is clear and moist.  Eyes: EOM are normal. Pupils are equal, round, and reactive to light.  Neck: Normal range of motion. Neck supple. No JVD present.  Cardiovascular: Normal rate and regular rhythm.  Exam reveals no gallop and no friction rub.   No murmur heard. Pulmonary/Chest: Effort normal and breath sounds normal. No respiratory distress. He has no wheezes. He has no rales. He exhibits no tenderness.  Abdominal: Soft. Bowel sounds are normal. There is no tenderness. There is no rebound and no guarding.  Musculoskeletal: Normal range of motion. He exhibits no edema, tenderness or deformity.  No lower extremity swelling, asymmetry or tenderness.  Neurological: He is alert and oriented to person, place, and time.  Moving all extremities without deficit. Sensation is fully intact.  Skin: Skin is warm and dry. Capillary  refill takes less than 2 seconds. No rash noted. No erythema.  Psychiatric: He has a normal mood and affect. His behavior is normal.  Nursing note and vitals reviewed.    ED Treatments / Results  DIAGNOSTIC STUDIES: Oxygen Saturation is 98% on RA, normal by my interpretation.    COORDINATION OF CARE: 9:51 PM Discussed treatment plan with pt at bedside which includes Nitroglycerin, Aspirin, BNP and cardiac monitoring and pt agreed to plan.  Labs (all labs ordered are listed, but only abnormal results are displayed) Labs Reviewed  BASIC METABOLIC PANEL - Abnormal; Notable for the following:       Result Value   Chloride 100 (*)    Glucose, Bld 117 (*)    All other components within normal limits  CBC - Abnormal; Notable for the following:    Hemoglobin 12.2 (*)    HCT 36.2 (*)    All other components within normal limits  TROPONIN I  BRAIN NATRIURETIC PEPTIDE    EKG  EKG Interpretation  Date/Time:  Friday July 06 2016 21:12:05 EST Ventricular Rate:  79 PR Interval:    QRS Duration: 79 QT Interval:  371 QTC Calculation: 426 R Axis:   18 Text Interpretation:  Sinus rhythm Probable left atrial enlargement Confirmed by Lita Mains  MD, Aaradhya Kysar ([id number]) on 07/06/2016 10:12:54 PM       Radiology Dg Chest 2 View  Result Date: 07/06/2016 CLINICAL DATA:  Acute onset of intermittent generalized chest tightness. Palpitations, shortness of breath and dizziness. Initial encounter. EXAM: CHEST  2 VIEW COMPARISON:  Chest radiograph performed 03/27/2012 FINDINGS: The lungs are well-aerated and clear. There is no evidence of focal opacification, pleural effusion or pneumothorax. The heart is normal in size; the mediastinal contour is within normal limits. No acute osseous abnormalities are seen. IMPRESSION: No acute cardiopulmonary process seen. Electronically Signed   By: Garald Balding M.D.   On: 07/06/2016 21:51    Procedures Procedures (including critical care time)  Medications  Ordered in ED Medications  nitroGLYCERIN (NITROSTAT) SL tablet 0.4 mg (not administered)  aspirin chewable tablet 324 mg (324 mg Oral Given 07/06/16 2207)     Initial Impression / Assessment and Plan / ED Course  I have reviewed the triage vital signs and the nursing notes.  Pertinent labs & imaging  results that were available during my care of the patient were reviewed by me and considered in my medical decision making (see chart for details).     I personally performed the services described in this documentation, which was scribed in my presence. The recorded information has been reviewed and is accurate.   Patient given aspirin and nitroglycerin. Multiple risk factors for coronary artery disease. Concerning exertional symptoms. He will likely need admission. EKG without any acute abnormality. Initial troponin is normal. Discussed with Dr. Marin Comment who will admit for chest pain rule out. Final Clinical Impressions(s) / ED Diagnoses   Final diagnoses:  Chest pain, unspecified type    New Prescriptions New Prescriptions   No medications on file     Julianne Rice, MD 07/06/16 2350

## 2016-07-06 NOTE — ED Triage Notes (Signed)
Pt report intermittent, chest tightness x 2 days.Pt states tonight about 8 pm he felt some palpitations and felt sob and dizzy. Pt reports that his arms are aching.

## 2016-07-06 NOTE — H&P (Signed)
History and Physical    Angel Dixon [id number] DOB: September 19, 1940 DOA: 07/06/2016  PCP: Robert Bellow, MD  Patient coming from: Home.    Chief Complaint:  Transient 10-20 seconds episodic non exertional chest pain.   HPI: Angel Dixon is an 76 y.o. male with fmily hx of premature CAD, hx of non obstructive CAD with last cath 2012, HTN, DM, HLD, presented with intermittent short interval of 10 seconds non exertional chest pain.  One episode, he had some left shoulder radiation.  He also has hx of GERD on PPI.  No nausea or vomiting, and slight diaphoresis when he get his "low blood sugar).  He sees Dr Debara Pickett of cardiology for HTN, but hadn't seen him in a year.  Work up in the ER included an EKG which looks perfectly normal, and negative initial troponin.  He is asymptomatic in the ER.  Hospitalist was asked to admit him for cardiac r/out.    ED Course:  See above.  Rewiew of Systems:  Constitutional: Negative for malaise, fever and chills. No significant weight loss or weight gain Eyes: Negative for eye pain, redness and discharge, diplopia, visual changes, or flashes of light. ENMT: Negative for ear pain, hoarseness, nasal congestion, sinus pressure and sore throat. No headaches; tinnitus, drooling, or problem swallowing. Cardiovascular: Negative for palpitations, diaphoresis, dyspnea and peripheral edema. ; No orthopnea, PND Respiratory: Negative for cough, hemoptysis, wheezing and stridor. No pleuritic chestpain. Gastrointestinal: Negative for diarrhea, constipation,  melena, blood in stool, hematemesis, jaundice and rectal bleeding.    Genitourinary: Negative for frequency, dysuria, incontinence,flank pain and hematuria; Musculoskeletal: Negative for back pain and neck pain. Negative for swelling and trauma.;  Skin: . Negative for pruritus, rash, abrasions, bruising and skin lesion.; ulcerations Neuro: Negative for headache, lightheadedness and neck stiffness. Negative for  weakness, altered level of consciousness , altered mental status, extremity weakness, burning feet, involuntary movement, seizure and syncope.  Psych: negative for anxiety, depression, insomnia, tearfulness, panic attacks, hallucinations, paranoia, suicidal or homicidal ideation    Past Medical History:  Diagnosis Date  . Angina   . CAD (coronary artery disease)    moderate, by cath  . Diabetes mellitus   . DJD (degenerative joint disease)    lumbar lam 4/05  . Dyslipidemia   . Dysrhythmia    "palpitation", PVCs  . GERD (gastroesophageal reflux disease)   . Hypertension   . PUD (peptic ulcer disease)    endo 03/15/10     Past Surgical History:  Procedure Laterality Date  . BACK SURGERY  2005  . CARDIAC CATHETERIZATION  02/22/2006   mild obs. CAD 60-70% narrowing in small inferior branch of 1st diagonal of LAD, smooth 10% narrowing of mid RCA (Dr. Corky Downs)  . 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 Marella Chimes)  . CARDIAC CATHETERIZATION  05/11/2011   nonobstructive CAD (Dr. Roni Bread)  . COLONOSCOPY N/A 06/13/2016   Procedure: COLONOSCOPY;  Surgeon: Rogene Houston, MD;  Location[facility];  Service: Endoscopy;  Laterality: N/A;  1200  . HERNIA REPAIR    . [place]   right  . LEFT HEART CATHETERIZATION WITH CORONARY ANGIOGRAM N/A 05/14/2011   Procedure: LEFT HEART CATHETERIZATION WITH CORONARY ANGIOGRAM;  Surgeon: [name], MD;  Location: [facility] CATH LAB;  Service: Cardiovascular;  Laterality: N/A;  Coronary angiogram and possible PCI  . LUMBAR LAMINECTOMY  09/2003   L2-3,  3-4, 4-5  . NM MYOCAR PERF WALL MOTION  10/03/2009   bruce myoview - normal perfusion in all regions, EF 70%, no ischemia, low risk  . TRANSTHORACIC ECHOCARDIOGRAM  03/28/2012   EF Q000111Q, grade 1 diastolic dysfunction, mod AV calcification, mild MR     reports that he has quit smoking. His smoking use  included Cigarettes. He has a 25.00 pack-year smoking history. He has never used smokeless tobacco. He reports that he drinks alcohol. He reports that he does not use drugs.  Allergies  Allergen Reactions  . Lisinopril Other (See Comments)    Sore throat, no obvious angioedema or cough  . Sulfa Antibiotics Hives  . Vioxx [Rofecoxib] Hives  . Yellow Jacket Venom [Bee Venom] Swelling    Family History  Problem Relation Age of Onset  . Coronary artery disease Father     died 35  . Kidney failure Mother     died 36  . Sarcoidosis Daughter     died 3  . Heart attack Sister     died of MI in her 18s     Prior to Admission medications   Medication Sig Start Date End Date Taking? Authorizing Provider  alfuzosin (UROXATRAL) 10 MG 24 hr tablet Take 10 mg by mouth daily.  12/11/12  Yes Historical Provider, MD  Ascorbic Acid (VITAMIN C PO) Take 1 tablet by mouth daily as needed (immune support).   Yes Historical Provider, MD  aspirin EC 81 MG tablet Take 1 tablet (81 mg total) by mouth daily. 06/20/16  Yes Rogene Houston, MD  atorvastatin (LIPITOR) 40 MG tablet Take 40 mg by mouth daily.     Yes Historical Provider, MD  glipiZIDE (GLUCOTROL XL) 10 MG 24 hr tablet Take 10 mg by mouth daily with breakfast.   Yes Historical Provider, MD  ibuprofen (ADVIL,MOTRIN) 800 MG tablet Take 0.5 tablets (400 mg total) by mouth 3 (three) times daily as needed. 06/13/16  Yes Rogene Houston, MD  levofloxacin (LEVAQUIN) 500 MG tablet Take 500 mg by mouth daily. 10 day course starting on 06/27/2016 06/27/16  Yes Historical Provider, MD  metFORMIN (GLUCOPHAGE) 500 MG tablet Take 500 mg by mouth 2 (two) times daily.   Yes Historical Provider, MD  metoprolol tartrate (LOPRESSOR) 25 MG tablet Take 1.5 tablets (37.5 mg total) by mouth 2 (two) times daily. Patient taking differently: Take 25 mg by mouth daily.  02/25/14  Yes Pixie Casino, MD  pantoprazole (PROTONIX) 40 MG tablet Take 40 mg by mouth daily.     Yes  Historical Provider, MD  Polyethylene Glycol 400 (BLINK TEARS OP) Apply 1 drop to eye 2 (two) times daily.   Yes Historical Provider, MD  fluticasone (FLONASE) 50 MCG/ACT nasal spray Place 1 spray into both nostrils as needed for allergies or rhinitis.  09/20/14   Historical Provider, MD  nitroGLYCERIN (NITROSTAT) 0.4 MG SL tablet Place 1 tablet (0.4 mg total) under the tongue every 5 (five) minutes as needed for chest pain (severe chest pain or pressure only). 05/14/11 06/06/16  Erlene Quan, PA-C    Physical Exam: Vitals:   07/06/16 2200 07/06/16 2230 07/06/16 2300 07/06/16 2330  BP: 124/86 139/83 142/89 144/96  Pulse: 72 69 65 65  Resp: 15 12 15 12   Temp:      SpO2: 98% 98% 98% 97%  Weight:      Height:          Constitutional: NAD, calm, comfortable Vitals:   07/06/16  2200 07/06/16 2230 07/06/16 2300 07/06/16 2330  BP: 124/86 139/83 142/89 144/96  Pulse: 72 69 65 65  Resp: 15 12 15 12   Temp:      SpO2: 98% 98% 98% 97%  Weight:      Height:       Eyes: PERRL, lids and conjunctivae normal ENMT: Mucous membranes are moist. Posterior pharynx clear of any exudate or lesions.Normal dentition.  Neck: normal, supple, no masses, no thyromegaly Respiratory: clear to auscultation bilaterally, no wheezing, no crackles. Normal respiratory effort. No accessory muscle use.  Cardiovascular: Regular rate and rhythm, no murmurs / rubs / gallops. No extremity edema. 2+ pedal pulses. No carotid bruits.  Abdomen: no tenderness, no masses palpated. No hepatosplenomegaly. Bowel sounds positive.  Musculoskeletal: no clubbing / cyanosis. No joint deformity upper and lower extremities. Good ROM, no contractures. Normal muscle tone.  Skin: no rashes, lesions, ulcers. No induration Neurologic: CN 2-12 grossly intact. Sensation intact, DTR normal. Strength 5/5 in all 4.  Psychiatric: Normal judgment and insight. Alert and oriented x 3. Normal mood.     Labs on Admission: I have personally reviewed  following labs and imaging studies  CBC:  Recent Labs Lab 07/06/16 2140  WBC 5.1  HGB 12.2*  HCT 36.2*  MCV 83.4  PLT [id number]   Basic Metabolic Panel:  Recent Labs Lab 07/03/16 1837 07/06/16 2140  NA  --  138  K  --  3.9  CL  --  100*  CO2  --  28  GLUCOSE  --  117*  BUN  --  12  CREATININE 1.30* 1.03  CALCIUM  --  9.4   Cardiac Enzymes:  Recent Labs Lab 07/06/16 2140  TROPONINI <0.03   Urine analysis:    Component Value Date/Time   COLORURINE YELLOW 12/20/2014 1314   APPEARANCEUR CLEAR 12/20/2014 1314   LABSPEC 1.009 12/20/2014 1314   PHURINE 5.5 12/20/2014 1314   GLUCOSEU 100 (A) 12/20/2014 1314   HGBUR NEGATIVE 12/20/2014 1314   BILIRUBINUR NEGATIVE 12/20/2014 1314   KETONESUR NEGATIVE 12/20/2014 1314   PROTEINUR NEGATIVE 12/20/2014 1314   UROBILINOGEN 0.2 12/20/2014 1314   NITRITE NEGATIVE 12/20/2014 1314   LEUKOCYTESUR NEGATIVE 12/20/2014 1314   Radiological Exams on Admission: Dg Chest 2 View  Result Date: 07/06/2016 CLINICAL DATA:  Acute onset of intermittent generalized chest tightness. Palpitations, shortness of breath and dizziness. Initial encounter. EXAM: CHEST  2 VIEW COMPARISON:  Chest radiograph performed 03/27/2012 FINDINGS: The lungs are well-aerated and clear. There is no evidence of focal opacification, pleural effusion or pneumothorax. The heart is normal in size; the mediastinal contour is within normal limits. No acute osseous abnormalities are seen. IMPRESSION: No acute cardiopulmonary process seen. Electronically Signed   By: Garald Balding M.D.   On: 07/06/2016 21:51    EKG: Independently reviewed.  Assessment/Plan Principal Problem:   Chest pain Active Problems:   CAD (coronary artery disease), small Dx branch occl 2009   Diabetes mellitus ([facility])   HTN (hypertension)   Dyslipidemia   Atypical chest pain    PLAN:   Atypical CP:  Given he had 2 days of chest pain, and has normal EKG and negative troponin, I suspect he did not  have ACS.  He has known CAD, so will admit him obs for r/out.  Get ECHO to be sure that his heart has normal wall motion.  Continue ASA, BB, and statin.   DM:  Will continue oral meds, add sensitive SSI.  Will have carb  modified diet.  HLD: Continue with statin.    DVT prophylaxis: sub Q heparin.  Code Status: FULL CODE.  Family Communication: daughter and grand son.  Disposition Plan: to home when appropriate.  Consults called: None. Admission status: OBS>    Amado Andal MD FACP. [facility]  If 7PM-7AM, please contact night-coverage [email or web address] Password [email or web address]  07/06/2016, 11:55 PM

## 2016-07-07 ENCOUNTER — Encounter (HOSPITAL_COMMUNITY): Payer: Self-pay

## 2016-07-07 ENCOUNTER — Observation Stay (HOSPITAL_BASED_OUTPATIENT_CLINIC_OR_DEPARTMENT_OTHER): Payer: Medicare HMO

## 2016-07-07 DIAGNOSIS — R0789 Other chest pain: Secondary | ICD-10-CM | POA: Diagnosis not present

## 2016-07-07 DIAGNOSIS — R079 Chest pain, unspecified: Secondary | ICD-10-CM

## 2016-07-07 LAB — GLUCOSE, CAPILLARY
GLUCOSE-CAPILLARY: 82 mg/dL (ref 65–99)
GLUCOSE-CAPILLARY: 125 mg/dL — AB (ref 65–99)
Glucose-Capillary: 103 mg/dL — ABNORMAL HIGH (ref 65–99)

## 2016-07-07 LAB — ECHOCARDIOGRAM COMPLETE
Height: 68 in
Weight: 2374.4 oz

## 2016-07-07 LAB — TROPONIN I
Troponin I: <0.03 ng/mL (ref <0.03)
Troponin I: <0.03 ng/mL (ref <0.03)

## 2016-07-07 MED ORDER — PANTOPRAZOLE SODIUM 40 MG PO TBEC
40.0000 mg | DELAYED_RELEASE_TABLET | Freq: Every day | ORAL | Status: DC
  Administered 2016-07-07: 40 mg via ORAL
  Filled 2016-07-07: qty 1

## 2016-07-07 MED ORDER — GLIPIZIDE ER 5 MG PO TB24
10.0000 mg | ORAL_TABLET | Freq: Every day | ORAL | Status: DC
  Administered 2016-07-07: 10 mg via ORAL
  Filled 2016-07-07: qty 2

## 2016-07-07 MED ORDER — INSULIN ASPART 100 UNIT/ML ~~LOC~~ SOLN
0.0000 [IU] | Freq: Three times a day (TID) | SUBCUTANEOUS | Status: DC
  Administered 2016-07-07: 1 [IU] via SUBCUTANEOUS

## 2016-07-07 MED ORDER — ENOXAPARIN SODIUM 40 MG/0.4ML ~~LOC~~ SOLN
40.0000 mg | SUBCUTANEOUS | Status: DC
  Administered 2016-07-07: 40 mg via SUBCUTANEOUS
  Filled 2016-07-07: qty 0.4

## 2016-07-07 MED ORDER — FLUTICASONE PROPIONATE 50 MCG/ACT NA SUSP: 1.0000 | NASAL | Status: DC | PRN

## 2016-07-07 MED ORDER — VITAMIN C 500 MG PO TABS
500.0000 mg | ORAL_TABLET | Freq: Every day | ORAL | Status: DC
  Administered 2016-07-07: 500 mg via ORAL
  Filled 2016-07-07: qty 1

## 2016-07-07 MED ORDER — POLYVINYL ALCOHOL 1.4 % OP SOLN
1.0000 [drp] | Freq: Two times a day (BID) | OPHTHALMIC | Status: DC
  Administered 2016-07-07: 1 [drp] via OPHTHALMIC
  Filled 2016-07-07: qty 15

## 2016-07-07 MED ORDER — ATORVASTATIN CALCIUM 40 MG PO TABS
40.0000 mg | ORAL_TABLET | Freq: Every day | ORAL | Status: DC
Start: 2016-07-07 — End: 2016-07-07
  Administered 2016-07-07: 40 mg via ORAL
  Filled 2016-07-07: qty 1

## 2016-07-07 MED ORDER — METFORMIN HCL 500 MG PO TABS
500.0000 mg | ORAL_TABLET | Freq: Two times a day (BID) | ORAL | Status: DC
  Administered 2016-07-07: 500 mg via ORAL
  Filled 2016-07-07: qty 1

## 2016-07-07 MED ORDER — INSULIN ASPART 100 UNIT/ML ~~LOC~~ SOLN: 0.0000 [IU] | Freq: Every day | SUBCUTANEOUS | Status: DC

## 2016-07-07 MED ORDER — SODIUM CHLORIDE 0.9% FLUSH
3.0000 mL | Freq: Two times a day (BID) | INTRAVENOUS | Status: DC
  Administered 2016-07-07 (×2): 3 mL via INTRAVENOUS

## 2016-07-07 MED ORDER — ASPIRIN EC 81 MG PO TBEC
81.0000 mg | DELAYED_RELEASE_TABLET | Freq: Every day | ORAL | Status: DC
  Administered 2016-07-07: 81 mg via ORAL
  Filled 2016-07-07: qty 1

## 2016-07-07 MED ORDER — METOPROLOL TARTRATE 25 MG PO TABS
25.0000 mg | ORAL_TABLET | Freq: Every day | ORAL | Status: DC
Start: 2016-07-07 — End: 2016-07-07
  Administered 2016-07-07: 25 mg via ORAL
  Filled 2016-07-07: qty 1

## 2016-07-07 NOTE — Progress Notes (Signed)
Patient is to be discharged home and in stable condition. MD made no changes to medication regimen and patient states he will follow-up with in PCP next week. Family at bedtime to take patient home. Patient will be escorted out by staff.  Celestia Khat, RN

## 2016-07-07 NOTE — Progress Notes (Signed)
*  PRELIMINARY RESULTS* Echocardiogram 2D Echocardiogram has been performed.  Leavy Cella 07/07/2016, 12:19 PM

## 2016-07-07 NOTE — Discharge Summary (Addendum)
Physician Discharge Summary  Angel Dixon [id number] DOB: 14-Aug-1940 DOA: 07/06/2016  PCP: Angel Bellow, MD  Admit date: 07/06/2016 Discharge date: 07/07/2016  Time spent: 15 minutes  Recommendations for Outpatient Follow-up:  1. Patient will need outpatient follow-up with primary physician 2. No changes to medications  Discharge Diagnoses:  Principal Problem:   Chest pain Active Problems:   CAD (coronary artery disease), small Dx branch occl 2009   Diabetes mellitus ([place])   HTN (hypertension)   Dyslipidemia   Atypical chest pain   Discharge Condition: good  Diet recommendation: hh low salt  Filed Weights   07/06/16 2104 07/07/16 0118  Weight: 65.8 kg (145 lb) 67.3 kg (148 lb 6.4 oz)    History of present illness:  76 y/o ? Mod CaD per Cath 05/2011 episodic sncope in the past Holter 03/2014-NSVT + PVC + PC Htn H/o colonic adenomas  Admitted with ? Episodic nonexertional CP Troponin neg EKG on admit 3 troponins were cycled and the patient did not have any further recurrence of pain There is no acute injury pattern to his EKG on follow-up and echocardiogram was benign and patient was stabilized for discharge home His been encouraged for    Discharge Exam: Vitals:   07/07/16 0633 07/07/16 1428  BP: 133/86 132/84  Pulse: 79 94  Resp: 17 18  Temp: 98.2 F (36.8 C) 98.4 F (36.9 C)    General: eomi ncat Cardiovascular: s1 s2 no m/r/g Respiratory:  Clear no added sound abd soft nt nd no rebound no gaurd  Discharge Instructions    Current Discharge Medication List    CONTINUE these medications which have NOT CHANGED   Details  alfuzosin (UROXATRAL) 10 MG 24 hr tablet Take 10 mg by mouth daily.     Ascorbic Acid (VITAMIN C PO) Take 1 tablet by mouth daily as needed (immune support).    aspirin EC 81 MG tablet Take 1 tablet (81 mg total) by mouth daily.    atorvastatin (LIPITOR) 40 MG tablet Take 40 mg by mouth daily.      glipiZIDE  (GLUCOTROL XL) 10 MG 24 hr tablet Take 10 mg by mouth daily with breakfast.    ibuprofen (ADVIL,MOTRIN) 800 MG tablet Take 0.5 tablets (400 mg total) by mouth 3 (three) times daily as needed. Qty: 30 tablet, Refills: 3    levofloxacin (LEVAQUIN) 500 MG tablet Take 500 mg by mouth daily. 10 day course starting on 06/27/2016    metFORMIN (GLUCOPHAGE) 500 MG tablet Take 500 mg by mouth 2 (two) times daily.    metoprolol tartrate (LOPRESSOR) 25 MG tablet Take 1.5 tablets (37.5 mg total) by mouth 2 (two) times daily. Qty: 270 tablet, Refills: 1    pantoprazole (PROTONIX) 40 MG tablet Take 40 mg by mouth daily.      Polyethylene Glycol 400 (BLINK TEARS OP) Apply 1 drop to eye 2 (two) times daily.    fluticasone (FLONASE) 50 MCG/ACT nasal spray Place 1 spray into both nostrils as needed for allergies or rhinitis.  Refills: 11    nitroGLYCERIN (NITROSTAT) 0.4 MG SL tablet Place 1 tablet (0.4 mg total) under the tongue every 5 (five) minutes as needed for chest pain (severe chest pain or pressure only). Qty: 25 tablet, Refills: 2       Allergies  Allergen Reactions  . Lisinopril Other (See Comments)    Sore throat, no obvious angioedema or cough  . Sulfa Antibiotics Hives  . Vioxx [Rofecoxib] Hives  . Yellow Jacket  Venom [Bee Venom] Swelling      The results of significant diagnostics from this hospitalization (including imaging, microbiology, ancillary and laboratory) are listed below for reference.    Significant Diagnostic Studies: Dg Chest 2 View  Result Date: 07/06/2016 CLINICAL DATA:  Acute onset of intermittent generalized chest tightness. Palpitations, shortness of breath and dizziness. Initial encounter. EXAM: CHEST  2 VIEW COMPARISON:  Chest radiograph performed 03/27/2012 FINDINGS: The lungs are well-aerated and clear. There is no evidence of focal opacification, pleural effusion or pneumothorax. The heart is normal in size; the mediastinal contour is within normal limits. No  acute osseous abnormalities are seen. IMPRESSION: No acute cardiopulmonary process seen. Electronically Signed   By: Garald Balding M.D.   On: 07/06/2016 21:51   Mr Abdomen Wwo Contrast  Result Date: 07/04/2016 CLINICAL DATA:  Recent abdominal renal ultrasound. Indeterminate renal lesion. MRI recommended for further evaluation. EXAM: MRI ABDOMEN WITHOUT AND WITH CONTRAST TECHNIQUE: Multiplanar multisequence MR imaging of the abdomen was performed both before and after the administration of intravenous contrast. CONTRAST:  97mL MULTIHANCE GADOBENATE DIMEGLUMINE 529 MG/ML IV SOLN COMPARISON:  Ultrasound 06/19/2016 FINDINGS: Lower chest:  Lung bases are clear. Hepatobiliary: No focal hepatic lesion. No biliary duct dilatation. No hepatic steatosis. Common bile duct normal. Gallbladder normal. Pancreas: Normal pancreatic parenchymal intensity. No ductal dilatation or inflammation. Spleen: Normal spleen. Adrenals/urinary tract: Large bilobed cystic lesion in the central RIGHT kidney and extending into the hilum measuring 5.7 x 3.2 cm. This bilobed cyst has thin intervening cyst wall without evidence of enhancement or nodularity. Lesion ahs typical high signal intensity on T2 weighted imaging and low signal intensity T1 weighted imaging. No enhancing nodularity. The several additional smaller cortical cysts are present within LEFT and RIGHT kidney. Stomach/Bowel: Stomach and limited of the small bowel is unremarkable Vascular/Lymphatic: Abdominal aortic normal caliber. No retroperitoneal periportal lymphadenopathy. Musculoskeletal: No aggressive osseous lesion IMPRESSION: 1. 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 2. 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 3. Normal liver. Electronically Signed   By: Suzy Bouchard M.D.   On: 07/04/2016 11:09   US Renal  Result Date: 06/19/2016 CLINICAL DATA:  BILATERAL flank pain,  history hypertension, diabetes mellitus, GERD, coronary artery disease EXAM: RENAL / URINARY TRACT ULTRASOUND COMPLETE COMPARISON:  CT abdomen and pelvis 02/20/2007 FINDINGS: Right Kidney: Length: 11.2 cm. Normal cortical thickness with upper normal cortical echogenicity. Large bilobed central RIGHT renal cyst 6.5 x 3.8 x 3.0 cm, increased in size since previous exam. Slightly irregular septations at the central portion complicate the lesion. No additional mass, hydronephrosis or shadowing calcification. Left Kidney: Length: 11.9 cm. Normal cortical thickness. Upper normal cortical echogenicity. No mass, hydronephrosis, or shadowing calcification Bladder: Normal appearance. Enlarged prostate gland 6.8 x 5.2 x 5.9 cm noted. IMPRESSION: Interval increase in size of a bilobed cyst at the mid RIGHT kidney, demonstrating slightly irregular and thick septation at the central portion ; further characterization of this lesion by MR imaging with and without contrast recommended to exclude cystic renal neoplasm. Significant prostatic enlargement. Electronically Signed   By: Lavonia Dana M.D.   On: 06/19/2016 15:11    Microbiology: No results found for this or any previous visit (from the past 240 hour(s)).   Labs: Basic Metabolic Panel:  Recent Labs Lab 07/03/16 1837 07/06/16 2140  NA  --  138  K  --  3.9  CL  --  100*  CO2  --  28  GLUCOSE  --  117*  BUN  --  12  CREATININE 1.30* 1.03  CALCIUM  --  9.4   Liver Function Tests: No results for input(s): AST, ALT, ALKPHOS, BILITOT, PROT, ALBUMIN in the last 168 hours. No results for input(s): LIPASE, AMYLASE in the last 168 hours. No results for input(s): AMMONIA in the last 168 hours. CBC:  Recent Labs Lab 07/06/16 2140  WBC 5.1  HGB 12.2*  HCT 36.2*  MCV 83.4  PLT 307   Cardiac Enzymes:  Recent Labs Lab 07/06/16 2140 07/07/16 0117 07/07/16 0822 07/07/16 1307  TROPONINI <0.03 <0.03 <0.03 <0.03   BNP: BNP (last 3 results)  Recent  Labs  07/06/16 2156  BNP 11.0    ProBNP (last 3 results) No results for input(s): PROBNP in the last 8760 hours.  CBG:  Recent Labs Lab 07/07/16 0156 07/07/16 0710 07/07/16 1109  GLUCAP 82 125* 103*       Signed:  Nita Sells MD   [facility] 07/07/2016, 3:25 PM

## 2016-08-03 ENCOUNTER — Encounter: Payer: Self-pay | Admitting: Internal Medicine

## 2016-08-03 ENCOUNTER — Ambulatory Visit (INDEPENDENT_AMBULATORY_CARE_PROVIDER_SITE_OTHER): Payer: Medicare HMO | Admitting: Internal Medicine

## 2016-08-03 VITALS — BP 162/98 | HR 69 | Ht 68.0 in | Wt 154.6 lb

## 2016-08-03 DIAGNOSIS — I493 Ventricular premature depolarization: Secondary | ICD-10-CM

## 2016-08-03 DIAGNOSIS — I10 Essential (primary) hypertension: Secondary | ICD-10-CM

## 2016-08-03 DIAGNOSIS — I2510 Atherosclerotic heart disease of native coronary artery without angina pectoris: Secondary | ICD-10-CM

## 2016-08-03 MED ORDER — AMLODIPINE BESYLATE 5 MG PO TABS: 5.0000 mg | ORAL_TABLET | Freq: Every day | ORAL | 3 refills | Status: DC

## 2016-08-03 NOTE — Patient Instructions (Addendum)
Your physician recommends that you schedule a follow-up appointment in: 2-3 weeks with clinical pharmacist (in our office) for a blood pressure check   Your physician wants you to follow-up in: ONE YEAR with Dr. Debara Pickett. You will receive a reminder letter in the mail two months in advance. If you don't receive a letter, please call our office to schedule the follow-up appointment.  Your physician has recommended you make the following change in your medication: START amlodipine 5mg  daily  Monitor home blood pressures Take BP no more than twice daily Take BP 1-2 hours after taking BP meds Take BP after resting, sitting for 5-10 minutes  HAPPY BIRTHDAY!
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
## 2016-08-07 DIAGNOSIS — Z961 Presence of intraocular lens: Secondary | ICD-10-CM | POA: Insufficient documentation

## 2016-08-24 ENCOUNTER — Encounter: Payer: Self-pay | Admitting: Pharmacist Clinician (PhC)/ Clinical Pharmacy Specialist

## 2016-08-24 ENCOUNTER — Ambulatory Visit (INDEPENDENT_AMBULATORY_CARE_PROVIDER_SITE_OTHER): Payer: Medicare HMO | Admitting: Pharmacist Clinician (PhC)/ Clinical Pharmacy Specialist

## 2016-08-24 DIAGNOSIS — I10 Essential (primary) hypertension: Secondary | ICD-10-CM

## 2016-08-24 NOTE — Assessment & Plan Note (Signed)
Patient has noted improved blood pressure since starting amlodipine.  Home cuff was good, read easily within 5 points of the office reading.  He seems to have some element of white coat hypertension, as home average was 122/82.  I have asked that he continue with regular home monitoring and call us in 4-5 weeks to let us know how he is doing.  I am more worried about the diastolic readings and would like to see them more < 80.

## 2016-08-24 NOTE — Progress Notes (Signed)
08/24/2016 Angel Dixon 16-Oct-1940 [id number]   HPI:  Angel Dixon is a 76 y.o. male patient of Dr Debara Pickett, with a PMH below who presents today for hypertension clinic evaluation.  His cardiac history is significant for moderate CAD.  Catheterization in 2012 showed 40-50% stenosis in mid circumlex, 60% in OM-1 and 40% in PDA.  He has not had any significant problems over the years.  Today he reports no side effects or concerns with the amlodipine and it has brought his systolic pressure down nicely, although the diastolic readings are still mostly in the 80's.  No chest pains, shortness of breath, dizziness or lower extremity edema.    Blood Pressure Goal: 130/80   Current Medications:  Metoprolol 37.5 mg bid   Amlodipine 5 mg qd  Family Hx:  Father - had pacemaker died in his 85's from heart disease  Mother - old age at 73 (diabetes)  1 sister with MI died in her 7's   Social Hx:  Quit 30+ years ago; occasional alcohol; occasional caffeine - decaf coffee; diet caffeine free soda   Diet:  Mostly home, some out; avoids fried except occasional fish; rare salt  Exercise:  Starting back to [employer] - likes to walk treadmills  Home BP readings:  Newer Omron cuff, about 76 year old.  Nineteen home readings average 122/82.  Of note, 12 of the 19 diastolic readings were > 80, going as high as 89.    Intolerances:   Lisinopril - angioedema  Wt Readings from Last 3 Encounters:  08/03/16 154 lb 9.6 oz (70.1 kg)  07/07/16 148 lb 6.4 oz (67.3 kg)  06/13/16 144 lb (65.3 kg)   BP Readings from Last 3 Encounters:  08/24/16 (!) 156/88  08/03/16 (!) 162/98  07/07/16 132/84   Pulse Readings from Last 3 Encounters:  08/24/16 76  08/03/16 69  07/07/16 94    Current Outpatient Prescriptions  Medication Sig Dispense Refill  . alfuzosin (UROXATRAL) 10 MG 24 hr tablet Take 10 mg by mouth daily.     Marland Kitchen ALPRAZolam (XANAX) 1 MG tablet Take 1 tablet by mouth as needed.  5  . amLODipine  (NORVASC) 5 MG tablet Take 1 tablet (5 mg total) by mouth daily. 90 tablet 3  . Ascorbic Acid (VITAMIN C PO) Take 1 tablet by mouth daily as needed (immune support).    Marland Kitchen aspirin EC 81 MG tablet Take 1 tablet (81 mg total) by mouth daily.    Marland Kitchen atorvastatin (LIPITOR) 40 MG tablet Take 40 mg by mouth daily.      . fluticasone (FLONASE) 50 MCG/ACT nasal spray Place 1 spray into both nostrils as needed for allergies or rhinitis.   11  . glipiZIDE (GLUCOTROL XL) 10 MG 24 hr tablet Take 10 mg by mouth daily with breakfast.    . ibuprofen (ADVIL,MOTRIN) 800 MG tablet Take 0.5 tablets (400 mg total) by mouth 3 (three) times daily as needed. 30 tablet 3  . metFORMIN (GLUCOPHAGE) 500 MG tablet Take 500 mg by mouth 2 (two) times daily.    . metoprolol tartrate (LOPRESSOR) 25 MG tablet Take 1.5 tablets (37.5 mg total) by mouth 2 (two) times daily. (Patient taking differently: Take 37.5 mg by mouth daily. ) 270 tablet 1  . nitroGLYCERIN (NITROSTAT) 0.4 MG SL tablet Place 1 tablet (0.4 mg total) under the tongue every 5 (five) minutes as needed for chest pain (severe chest pain or pressure only). 25 tablet 2  .  pantoprazole (PROTONIX) 40 MG tablet Take 40 mg by mouth daily.      . Polyethylene Glycol 400 (BLINK TEARS OP) Apply 1 drop to eye 2 (two) times daily.     No current facility-administered medications for this visit.     Allergies  Allergen Reactions  . Lisinopril Other (See Comments)    Sore throat, no obvious angioedema or cough  . Sulfa Antibiotics Hives  . Vioxx [Rofecoxib] Hives  . Yellow Jacket Venom [Bee Venom] Swelling    Past Medical History:  Diagnosis Date  . Angina   . CAD (coronary artery disease)    moderate, by cath  . Diabetes mellitus   . DJD (degenerative joint disease)    lumbar lam 4/05  . Dyslipidemia   . Dysrhythmia    "palpitation", PVCs  . GERD (gastroesophageal reflux disease)   . Hypertension   . PUD (peptic ulcer disease)    endo 03/15/10    Blood  pressure (!) 156/88, pulse 76.  HTN (hypertension) Patient has noted improved blood pressure since starting amlodipine.  Home cuff was good, read easily within 5 points of the office reading.  He seems to have some element of white coat hypertension, as home average was 122/82.  I have asked that he continue with regular home monitoring and call us in 4-5 weeks to let us know how he is doing.  I am more worried about the diastolic readings and would like to see them more < 80.     Tommy Medal PharmD CPP [facility]

## 2016-08-24 NOTE — Patient Instructions (Signed)
Call in 4-5 weeks and let us know how you blood pressure is doing.   We want to watch the diastolic (bottom) number, goal is to try and keep it at or below 80.  Your blood pressure today is 156/88 (goal is <130/80)   Check your blood pressure at home most days and keep record of the readings.  Take your BP meds as follows:  Continue with metoprolol and amlodipine  Bring all of your meds, your BP cuff and your record of home blood pressures to your next appointment.  Exercise as you're able, try to walk approximately 30 minutes per day.  Keep salt intake to a minimum, especially watch canned and prepared boxed foods.  Eat more fresh fruits and vegetables and fewer canned items.  Avoid eating in fast food restaurants.    HOW TO TAKE YOUR BLOOD PRESSURE: . Rest 5 minutes before taking your blood pressure. .  Don't smoke or drink caffeinated beverages for at least 30 minutes before. . Take your blood pressure before (not after) you eat. . Sit comfortably with your back supported and both feet on the floor (don't cross your legs). . Elevate your arm to heart level on a table or a desk. . Use the proper sized cuff. It should fit smoothly and snugly around your bare upper arm. There should be enough room to slip a fingertip under the cuff. The bottom edge of the cuff should be 1 inch above the crease of the elbow. . Ideally, take 3 measurements at one sitting and record the average.

## 2016-08-29 ENCOUNTER — Telehealth: Payer: Self-pay | Admitting: Pharmacist

## 2016-08-29 DIAGNOSIS — I10 Essential (primary) hypertension: Secondary | ICD-10-CM

## 2016-08-29 MED ORDER — CHLORTHALIDONE 25 MG PO TABS: 12.5000 mg | ORAL_TABLET | Freq: Every day | ORAL | 5 refills | Status: DC

## 2016-08-29 NOTE — Telephone Encounter (Signed)
Patient called to report he had leg swelling and pain with amlodipine 5mg  daily.  Stopped medication 3 days ago and now all symptoms are gone.   Noted allergy to lisinopril.  Also note patient was on losartan on the past but medication was discontinued for unknown reason.    Recommendation:  1. STOP amlodipine 5mg  2. Start chlorthalidone 25mg  daily 3. Continue metoprolol 37.5 mg daily 4. Repeat BMET in 1-2 weeks  **Patient preference is to start chlorthalidone at lower possible dose. Will start at 12.5mg  (1/2 tablet) daily **  Rx sent to prefer pharmacy Order for BMET printed and left at from desk for patient to pick up

## 2016-09-25 ENCOUNTER — Ambulatory Visit (INDEPENDENT_AMBULATORY_CARE_PROVIDER_SITE_OTHER): Payer: Medicare HMO | Admitting: Pharmacist

## 2016-09-25 VITALS — BP 144/82 | HR 64

## 2016-09-25 DIAGNOSIS — I10 Essential (primary) hypertension: Secondary | ICD-10-CM | POA: Diagnosis not present

## 2016-09-25 NOTE — Progress Notes (Signed)
Patient ID: [name]                 DOB: 02/08/1941                      MRN: [id number]     HPI:  Angel Dixon is a 76 y.o. male patient of Dr Angel Dixon  PMH includes hypertension, and moderate CAD. He has not had any significant problems over the years.  Patient reported some pain and swelling with amlodipine on 08/29/16 and amlodipine was replaced by chlorthalidone 12.5mg  daily.   Patient reports he got dizzy after initiating chlorthalidone 12.5mg  daily and both medication (amlodipine and chlorthalidone) were discontinued by PCP ,Dr Angel Dixon. Currently is taking only metoprolol 37.5mg  twice daily for HTN management per PCP recommendations.    Blood Pressure Goal: 130/80   Current Medications:             Metoprolol 37.5 mg twice daily              Family Hx:             Father - had pacemaker died in his 54's from heart disease             Mother - old age at 5 (diabetes)             1 sister with MI died in her 43's              Social Hx:             Quit 30+ years ago; occasional alcohol; occasional caffeine - decaf coffee; diet caffeine free soda   Diet:             Mostly home, some out; avoids fried except occasional fish; rare salt  Exercise:             Yard work and walk on treadmills ~2 miles   Intolerances:              Lisinopril - angioedema  Home BP readings:  18 readings;  Average 124/87 (117-136/76-87); pulse 62-91  Wt Readings from Last 3 Encounters:  08/03/16 154 lb 9.6 oz (70.1 kg)  07/07/16 148 lb 6.4 oz (67.3 kg)  06/13/16 144 lb (65.3 kg)   BP Readings from Last 3 Encounters:  09/25/16 (!) 144/82  08/24/16 (!) 156/88  08/03/16 (!) 162/98   Pulse Readings from Last 3 Encounters:  09/25/16 64  08/24/16 76  08/03/16 69    Past Medical History:  Diagnosis Date  . Angina   . CAD (coronary artery disease)    moderate, by cath  . Diabetes mellitus   . DJD (degenerative joint disease)    lumbar lam 4/05  . Dyslipidemia   .  Dysrhythmia    "palpitation", PVCs  . GERD (gastroesophageal reflux disease)   . Hypertension   . PUD (peptic ulcer disease)    endo 03/15/10    Current Outpatient Prescriptions on File Prior to Visit  Medication Sig Dispense Refill  . alfuzosin (UROXATRAL) 10 MG 24 hr tablet Take 10 mg by mouth daily.     Marland Kitchen ALPRAZolam (XANAX) 1 MG tablet Take 1 tablet by mouth as needed.  5  . Ascorbic Acid (VITAMIN C PO) Take 1 tablet by mouth daily as needed (immune support).    Marland Kitchen aspirin EC 81 MG tablet Take 1 tablet (81 mg total) by mouth daily.    Marland Kitchen atorvastatin (LIPITOR) 40  MG tablet Take 40 mg by mouth daily.      . chlorthalidone (HYGROTON) 25 MG tablet Take 0.5 tablets (12.5 mg total) by mouth daily. 15 tablet 5  . fluticasone (FLONASE) 50 MCG/ACT nasal spray Place 1 spray into both nostrils as needed for allergies or rhinitis.   11  . glipiZIDE (GLUCOTROL XL) 10 MG 24 hr tablet Take 10 mg by mouth daily with breakfast.    . ibuprofen (ADVIL,MOTRIN) 800 MG tablet Take 0.5 tablets (400 mg total) by mouth 3 (three) times daily as needed. 30 tablet 3  . metFORMIN (GLUCOPHAGE) 500 MG tablet Take 500 mg by mouth 2 (two) times daily.    . metoprolol tartrate (LOPRESSOR) 25 MG tablet Take 1.5 tablets (37.5 mg total) by mouth 2 (two) times daily. (Patient taking differently: Take 37.5 mg by mouth daily. ) 270 tablet 1  . nitroGLYCERIN (NITROSTAT) 0.4 MG SL tablet Place 1 tablet (0.4 mg total) under the tongue every 5 (five) minutes as needed for chest pain (severe chest pain or pressure only). 25 tablet 2  . pantoprazole (PROTONIX) 40 MG tablet Take 40 mg by mouth daily.      . Polyethylene Glycol 400 (BLINK TEARS OP) Apply 1 drop to eye 2 (two) times daily.     No current facility-administered medications on file prior to visit.     Allergies  Allergen Reactions  . Lisinopril Other (See Comments)    Sore throat, no obvious angioedema or cough  . Sulfa Antibiotics Hives  . Vioxx [Rofecoxib] Hives    . Yellow Jacket Venom [Bee Venom] Swelling    Blood pressure (!) 144/82, pulse 64, SpO2 98 %.  Essential hypertension: Blood pressure today remains above desired goal range. Patient continues to reports well controlled BP with an average reading of 124/87.  PCP is adjusting patient blood pressure medication; therefore, will defer any further adjustment in HTN therapy to PCP to avoid duplication or confusion in therapy.  Will continue all medication as prescribed without changes and follow up as needed.  Angel Dixon PharmD, [place] [address] 09/25/2016 8:22 PM

## 2016-09-25 NOTE — Patient Instructions (Signed)
Return for a follow up appointment as needed  Your blood pressure today is 144/82 pulse 64   Check your blood pressure at home daily (if able) and keep record of the readings.  Take your BP meds as follows: **Metoprolol 37.5mg  twice daily**  Exercise as you're able, try to walk approximately 30 minutes per day.  Keep salt intake to a minimum, especially watch canned and prepared boxed foods.  Eat more fresh fruits and vegetables and fewer canned items.  Avoid eating in fast food restaurants.    HOW TO TAKE YOUR BLOOD PRESSURE: . Rest 5 minutes before taking your blood pressure. .  Don't smoke or drink caffeinated beverages for at least 30 minutes before. . Take your blood pressure before (not after) you eat. . Sit comfortably with your back supported and both feet on the floor (don't cross your legs). . Elevate your arm to heart level on a table or a desk. . Use the proper sized cuff. It should fit smoothly and snugly around your bare upper arm. There should be enough room to slip a fingertip under the cuff. The bottom edge of the cuff should be 1 inch above the crease of the elbow. . Ideally, take 3 measurements at one sitting and record the average.

## 2016-11-17 ENCOUNTER — Emergency Department (HOSPITAL_COMMUNITY)
Admission: EM | Admit: 2016-11-17 | Discharge: 2016-11-18 | Disposition: A | Discharge status: Home / Self Care | Payer: Medicare HMO | Attending: Emergency Medicine | Admitting: Emergency Medicine

## 2016-11-17 ENCOUNTER — Encounter (HOSPITAL_COMMUNITY): Payer: Self-pay | Admitting: Emergency Medicine

## 2016-11-17 DIAGNOSIS — Z79899 Other long term (current) drug therapy: Secondary | ICD-10-CM | POA: Diagnosis not present

## 2016-11-17 DIAGNOSIS — Z7984 Long term (current) use of oral hypoglycemic drugs: Secondary | ICD-10-CM | POA: Insufficient documentation

## 2016-11-17 DIAGNOSIS — Z87891 Personal history of nicotine dependence: Secondary | ICD-10-CM | POA: Diagnosis not present

## 2016-11-17 DIAGNOSIS — E119 Type 2 diabetes mellitus without complications: Secondary | ICD-10-CM | POA: Diagnosis not present

## 2016-11-17 DIAGNOSIS — I2510 Atherosclerotic heart disease of native coronary artery without angina pectoris: Secondary | ICD-10-CM | POA: Insufficient documentation

## 2016-11-17 DIAGNOSIS — Z7982 Long term (current) use of aspirin: Secondary | ICD-10-CM | POA: Insufficient documentation

## 2016-11-17 DIAGNOSIS — I10 Essential (primary) hypertension: Secondary | ICD-10-CM | POA: Insufficient documentation

## 2016-11-17 DIAGNOSIS — R0609 Other forms of dyspnea: Secondary | ICD-10-CM | POA: Diagnosis not present

## 2016-11-17 DIAGNOSIS — R55 Syncope and collapse: Secondary | ICD-10-CM

## 2016-11-17 DIAGNOSIS — R42 Dizziness and giddiness: Secondary | ICD-10-CM | POA: Diagnosis present

## 2016-11-17 LAB — CBG MONITORING, ED: GLUCOSE-CAPILLARY: 124 mg/dL — AB (ref 65–99)

## 2016-11-17 MED ORDER — SODIUM CHLORIDE 0.9 % IV BOLUS (SEPSIS)
1000.0000 mL | Freq: Once | INTRAVENOUS | Status: AC
  Administered 2016-11-18: 1000 mL via INTRAVENOUS

## 2016-11-17 NOTE — ED Notes (Signed)
ED Provider at bedside. 

## 2016-11-17 NOTE — ED Triage Notes (Signed)
Per EMS, pt reports he has an episode of "dizzyness and vomiting".  Pt reports he was outside watering the yard most of the day and believes he got overheated.  Denies chest pain, syncope, fever.

## 2016-11-17 NOTE — ED Provider Notes (Signed)
[facility] DEPT Provider Note   CSN: [id number] Arrival date & time: 11/17/16  2327  By signing my name below, I, Marcello Moores, attest that this documentation has been prepared under the direction and in the presence of Ciera Beckum, Annie Main, MD. Electronically Signed: Marcello Moores, ED Scribe. 11/17/16. 11:57 PM.  History   Chief Complaint Chief Complaint  Patient presents with  . Dizziness   The history is provided by the patient. No language interpreter was used.   HPI Comments: Angel Dixon is a 76 y.o. male who presents to the Emergency Department complaining of an 20 minute episode of resolved, moderate sudden onset oflight-headedness and nausea, blurry vision, and vomiting (2x) that induced near sycope an hour and a half ago. He reports that he was working outside all day, was staying hydrated, and maintained regular meals before the onset of his symptoms. The pt states that he sat down to "rest" 40 minutes before his symptoms began. He states that he has had similar symptoms in the past many years ago. The pt denies a h/x of coronary artery stent, CAD, GERD and PUD. He also denies abdominal pain, diarrhea, headache, fever, diaphoresis, headache, and chest pain.    Past Medical History:  Diagnosis Date  . Angina   . CAD (coronary artery disease)    moderate, by cath  . Diabetes mellitus   . DJD (degenerative joint disease)    lumbar lam 4/05  . Dyslipidemia   . Dysrhythmia    "palpitation", PVCs  . GERD (gastroesophageal reflux disease)   . Hypertension   . PUD (peptic ulcer disease)    endo 03/15/10    Patient Active Problem List   Diagnosis Date Noted  . Atypical chest pain 07/06/2016  . History of colonic polyps 03/14/2016  . NSVT (nonsustained ventricular tachycardia) ([place]) 04/02/2014  . Chest pain 03/28/2012  . Palpitations 05/13/2011  . 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 Acute coronary syndrome ([place]) 05/11/2011  . Coronary artery disease involving  native coronary artery of native heart without angina pectoris 05/11/2011  . Diabetes mellitus ([place]) 05/11/2011  . HTN (hypertension) 05/11/2011  . Dyslipidemia 05/11/2011  . Family history of early CAD 05/11/2011    Past Surgical History:  Procedure Laterality Date  . BACK SURGERY  2005  . CARDIAC CATHETERIZATION  02/22/2006   mild obs. CAD 60-70% narrowing in small inferior branch of 1st diagonal of LAD, smooth 10% narrowing of mid RCA (Dr. Corky Downs)  . 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 Marella Chimes)  . CARDIAC CATHETERIZATION  05/11/2011   nonobstructive CAD (Dr. Roni Bread)  . COLONOSCOPY N/A 06/13/2016   Procedure: COLONOSCOPY;  Surgeon: Rogene Houston, MD;  Location: [facility] SUITE;  Service: Endoscopy;  Laterality: N/A;  1200  . HERNIA REPAIR    . [place]   right  . LEFT HEART CATHETERIZATION WITH CORONARY ANGIOGRAM N/A 05/14/2011   Procedure: LEFT HEART CATHETERIZATION WITH CORONARY ANGIOGRAM;  Surgeon: [name], MD;  Location: [facility] C[facility] LAB;  Service: Cardiovascular;  Laterality: N/A;  Coronary angiogram and possible PCI  . LUMBAR LAMINECTOMY  09/2003   L2-3, 3-4, 4-5  . NM MYOCAR PERF WALL MOTION  10/03/2009   bruce myoview - normal perfusion in all regions, EF 70%, no ischemia, low risk  . TRANSTHORACIC ECHOCARDIOGRAM  03/28/2012   EF 13-08%, grade 1 diastolic dysfunction, mod AV calcification, mild MR  Home Medications    Prior to Admission medications   Medication Sig Start Date End Date Taking? Authorizing Provider  alfuzosin (UROXATRAL) 10 MG 24 hr tablet Take 10 mg by mouth daily.  12/11/12   [provider]  ALPRAZolam Duanne Moron) 1 MG tablet Take 1 tablet by mouth as needed. 07/10/16   [provider]  Ascorbic Acid (VITAMIN C PO) Take 1 tablet by mouth daily as needed (immune support).    [provider]  aspirin EC 81 MG tablet  Take 1 tablet (81 mg total) by mouth daily. 06/20/16   Rehman, Mechele Dawley, MD  atorvastatin (LIPITOR) 40 MG tablet Take 40 mg by mouth daily.      [provider]  fluticasone (FLONASE) 50 MCG/ACT nasal spray Place 1 spray into both nostrils as needed for allergies or rhinitis.  09/20/14   [provider]  glipiZIDE (GLUCOTROL XL) 10 MG 24 hr tablet Take 10 mg by mouth daily with breakfast.    [provider]  ibuprofen (ADVIL,MOTRIN) 800 MG tablet Take 0.5 tablets (400 mg total) by mouth 3 (three) times daily as needed. 06/13/16   Rehman, Mechele Dawley, MD  metFORMIN (GLUCOPHAGE) 500 MG tablet Take 500 mg by mouth 2 (two) times daily.    [provider]  metoprolol tartrate (LOPRESSOR) 25 MG tablet Take 1.5 tablets (37.5 mg total) by mouth 2 (two) times daily. Patient taking differently: Take 37.5 mg by mouth daily.  02/25/14   Hilty, Nadean Corwin, MD  nitroGLYCERIN (NITROSTAT) 0.4 MG SL tablet Place 1 tablet (0.4 mg total) under the tongue every 5 (five) minutes as needed for chest pain (severe chest pain or pressure only). 05/14/11 06/06/16  Erlene Quan, PA-C  pantoprazole (PROTONIX) 40 MG tablet Take 40 mg by mouth daily.      [provider]  Polyethylene Glycol 400 (BLINK TEARS OP) Apply 1 drop to eye 2 (two) times daily.    [provider]    Family History Family History  Problem Relation Age of Onset  . Coronary artery disease Father        died 48  . Kidney failure Mother        died 11  . Sarcoidosis Daughter        died 24  . Heart attack Sister        died of MI in her 94s    Social History Social History  Substance Use Topics  . Smoking status: Former Smoker    Packs/day: 1.00    Years: 25.00    Types: Cigarettes  . Smokeless tobacco: Never Used     Comment: quit smoking cigarettes ~ 1990  . Alcohol use Yes     Comment: 05/11/11 "shot every now and then"     Allergies   Lisinopril; Sulfa antibiotics; Vioxx [rofecoxib]; and  Yellow jacket venom [bee venom]   Review of Systems Review of Systems  10 Systems reviewed and are negative for acute change except as noted in the HPI.   Physical Exam Updated Vital Signs BP 136/78 (BP Location: Right Arm)   Pulse 79   Temp 98.1 F (36.7 C)   Resp 15   Ht 5\' 8"  (1.727 m)   Wt 144 lb (65.3 kg)   SpO2 100%   BMI 21.90 kg/m   Physical Exam  Constitutional: He is oriented to person, place, and time. He appears well-developed and well-nourished. No distress.  HENT:  Head: Normocephalic and atraumatic.  Mouth/Throat: Oropharynx  is clear and moist. No oropharyngeal exudate.  Eyes: Conjunctivae and EOM are normal. Pupils are equal, round, and reactive to light.  Neck: Normal range of motion. Neck supple.  No meningismus.  Cardiovascular: Normal rate, regular rhythm, normal heart sounds and intact distal pulses.   No murmur heard. Pulmonary/Chest: Effort normal and breath sounds normal. No respiratory distress. He has no wheezes. He has no rales.  Abdominal: Soft. There is no tenderness. There is no rebound and no guarding.  Musculoskeletal: Normal range of motion. He exhibits no edema or tenderness.  Neurological: He is alert and oriented to person, place, and time. No cranial nerve deficit. He exhibits normal muscle tone. Coordination normal.   5/5 strength throughout. CN 2-12 intact.Equal grip strength.      Skin: Skin is warm.  Psychiatric: He has a normal mood and affect. His behavior is normal.  Nursing note and vitals reviewed.    ED Treatments / Results   DIAGNOSTIC STUDIES: Oxygen Saturation is 100% on RA, normal by my interpretation.   COORDINATION OF CARE: 11:50 PM-Discussed next steps with pt. Pt verbalized understanding and is agreeable with the plan.   Labs (all labs ordered are listed, but only abnormal results are displayed) Labs Reviewed  BASIC METABOLIC PANEL - Abnormal; Notable for the following:       Result Value   Glucose, Bld  133 (*)    Creatinine, Ser 1.35 (*)    GFR calc non Af Amer 49 (*)    GFR calc Af Amer 57 (*)    All other components within normal limits  CBC - Abnormal; Notable for the following:    RBC 4.01 (*)    Hemoglobin 11.1 (*)    HCT 33.9 (*)    All other components within normal limits  CBG MONITORING, ED - Abnormal; Notable for the following:    Glucose-Capillary 124 (*)    All other components within normal limits  URINALYSIS, ROUTINE W REFLEX MICROSCOPIC  CK  TROPONIN I  BRAIN NATRIURETIC PEPTIDE  TROPONIN I  I-STAT TROPOININ, ED    EKG  EKG Interpretation  Date/Time:  Saturday November 17 2016 23:32:42 EDT Ventricular Rate:  75 PR Interval:    QRS Duration: 82 QT Interval:  384 QTC Calculation: 429 R Axis:   31 Text Interpretation:  Sinus rhythm Probable left atrial enlargement No significant change was found Confirmed by Ezequiel Essex [phone]) on 11/17/2016 11:39:44 PM       Radiology No results found.  Procedures Procedures (including critical care time)  Medications Ordered in ED Medications - No data to display   Initial Impression / Assessment and Plan / ED Course  I have reviewed the triage vital signs and the nursing notes.  Pertinent labs & imaging results that were available during my care of the patient were reviewed by me and considered in my medical decision making (see chart for details).    Patient presents with near syncopal episode described as lightheadedness with episode of vomiting. Occurred after working outside in the heat most of the day. He denies actual syncope. Denies chest pain, fever, abdominal pain. No headache. No focal weakness, numbness or tingling.  Patient appears well. EKG is unchanged from previous. Heart rate is 72 with lying and 82 with standing. IV fluids given. Cr mildly elevated to 1.3.  CK normal.   Patient states he feels well and back to baseline. He is tolerating PO and ambulatory without a problem.  Troponin negative  x2.  EKG unchanged. Doubt ACS, Doubt PE.  Suspect near syncopal episode due to heat exhaustion and over exertion.  Discussed PO hydrated at home. Follow up with PCP. Return precautions discussed.  Final Clinical Impressions(s) / ED Diagnoses   Final diagnoses:  Near syncope    New Prescriptions New Prescriptions   No medications on file   I personally performed the services described in this documentation, which was scribed in my presence. The recorded information has been reviewed and is accurate.     Ezequiel Essex, MD 11/18/16 2253

## 2016-11-18 LAB — BASIC METABOLIC PANEL
Anion gap: 8 (ref 5–15)
BUN: 18 mg/dL (ref 6–20)
CALCIUM: 9.1 mg/dL (ref 8.9–10.3)
CO2: 27 mmol/L (ref 22–32)
CREATININE: 1.35 mg/dL — AB (ref 0.61–1.24)
Chloride: 105 mmol/L (ref 101–111)
GFR calc Af Amer: 57 mL/min — ABNORMAL LOW (ref >60)
GFR, EST NON AFRICAN AMERICAN: 49 mL/min — AB (ref >60)
GLUCOSE: 133 mg/dL — AB (ref 65–99)
POTASSIUM: 3.7 mmol/L (ref 3.5–5.1)
SODIUM: 140 mmol/L (ref 135–145)

## 2016-11-18 LAB — URINALYSIS, ROUTINE W REFLEX MICROSCOPIC
BILIRUBIN URINE: NEGATIVE
Glucose, UA: NEGATIVE mg/dL
Hgb urine dipstick: NEGATIVE
KETONES UR: NEGATIVE mg/dL
LEUKOCYTES UA: NEGATIVE
NITRITE: NEGATIVE
PROTEIN: NEGATIVE mg/dL
Specific Gravity, Urine: 1.016 (ref 1.005–1.030)
pH: 5 (ref 5.0–8.0)

## 2016-11-18 LAB — CBC
HEMATOCRIT: 33.9 % — AB (ref 39.0–52.0)
Hemoglobin: 11.1 g/dL — ABNORMAL LOW (ref 13.0–17.0)
MCH: 27.7 pg (ref 26.0–34.0)
MCHC: 32.7 g/dL (ref 30.0–36.0)
MCV: 84.5 fL (ref 78.0–100.0)
PLATELETS: 264 10*3/uL (ref 150–400)
RBC: 4.01 MIL/uL — ABNORMAL LOW (ref 4.22–5.81)
RDW: 13 % (ref 11.5–15.5)
WBC: 5.8 10*3/uL (ref 4.0–10.5)

## 2016-11-18 LAB — I-STAT TROPONIN, ED: Troponin i, poc: 0 ng/mL (ref 0.00–0.08)

## 2016-11-18 LAB — BRAIN NATRIURETIC PEPTIDE: B Natriuretic Peptide: 9.1 pg/mL (ref 0.0–100.0)

## 2016-11-18 LAB — CK: Total CK: 312 U/L (ref 49–397)

## 2016-11-18 LAB — TROPONIN I
Troponin I: <0.03 ng/mL (ref <0.03)
Troponin I: <0.03 ng/mL

## 2016-11-18 NOTE — ED Notes (Signed)
Pt given water per MD approval.  No problems keeping fluids down.

## 2016-11-18 NOTE — Discharge Instructions (Signed)
Keep yourself hydrated. Don't over exert yourself in the heat. Follow up with your doctor. Return to the ED if you develop new or worsening symptoms.

## 2017-06-11 DIAGNOSIS — M545 Low back pain, unspecified: Secondary | ICD-10-CM | POA: Insufficient documentation

## 2017-06-22 DIAGNOSIS — M5136 Other intervertebral disc degeneration, lumbar region: Secondary | ICD-10-CM | POA: Insufficient documentation

## 2017-06-22 DIAGNOSIS — M961 Postlaminectomy syndrome, not elsewhere classified: Secondary | ICD-10-CM | POA: Insufficient documentation

## 2017-08-06 ENCOUNTER — Ambulatory Visit: Payer: Medicare HMO | Admitting: Internal Medicine

## 2017-08-28 ENCOUNTER — Ambulatory Visit: Payer: Medicare HMO | Admitting: Internal Medicine

## 2017-08-28 ENCOUNTER — Encounter: Payer: Self-pay | Admitting: Internal Medicine

## 2017-08-28 VITALS — BP 158/84 | HR 70 | Ht 68.0 in | Wt 150.0 lb

## 2017-08-28 DIAGNOSIS — E7800 Pure hypercholesterolemia, unspecified: Secondary | ICD-10-CM | POA: Insufficient documentation

## 2017-08-28 DIAGNOSIS — I10 Essential (primary) hypertension: Secondary | ICD-10-CM | POA: Diagnosis not present

## 2017-08-28 DIAGNOSIS — I493 Ventricular premature depolarization: Secondary | ICD-10-CM | POA: Diagnosis not present

## 2017-08-28 DIAGNOSIS — E785 Hyperlipidemia, unspecified: Secondary | ICD-10-CM | POA: Insufficient documentation

## 2017-08-28 DIAGNOSIS — E782 Mixed hyperlipidemia: Secondary | ICD-10-CM | POA: Insufficient documentation

## 2017-08-28 DIAGNOSIS — I2510 Atherosclerotic heart disease of native coronary artery without angina pectoris: Secondary | ICD-10-CM | POA: Diagnosis not present
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
## 2017-08-28 NOTE — Patient Instructions (Signed)
Your physician wants you to follow-up in: ONE YEAR with Dr. Hilty. You will receive a reminder letter in the mail two months in advance. If you don't receive a letter, please call our office to schedule the follow-up appointment.  

## 2017-10-15 IMAGING — MR MR ABDOMEN WO/W CM
9 of 18 series · 19 of 48 positions shown · IV contrast (multihance)
Comparison: Ultrasound 06/19/2016

CLINICAL DATA: Recent abdominal renal ultrasound. Indeterminate
renal lesion. MRI recommended for further evaluation.

EXAM:
MRI ABDOMEN WITHOUT AND WITH CONTRAST
TECHNIQUE: Multiplanar multisequence MR imaging of the abdomen was performed
both before and after the administration of intravenous contrast.
CONTRAST:  13mL MULTIHANCE GADOBENATE DIMEGLUMINE 529 MG/ML IV SOLN

[Series 3: T2 · coronal · 4.0mm · 1.08mm/px · 2 of 40 slices shown]
[im 1/40]
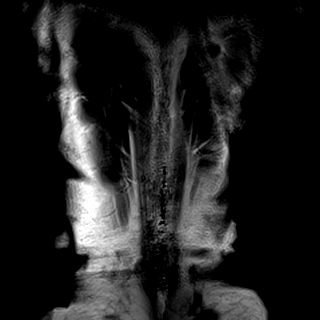
[im 40/40]
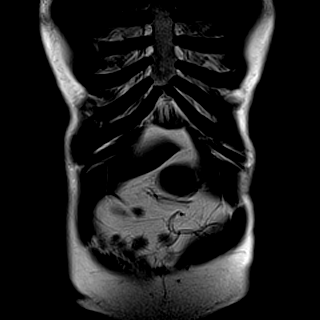

[Series 4: t2fs axial free-breathing · axial · 4.0mm · 0.48mm/px · z∈[-102,+123]mm · 2 of 48 slices shown]
[im 1/48]
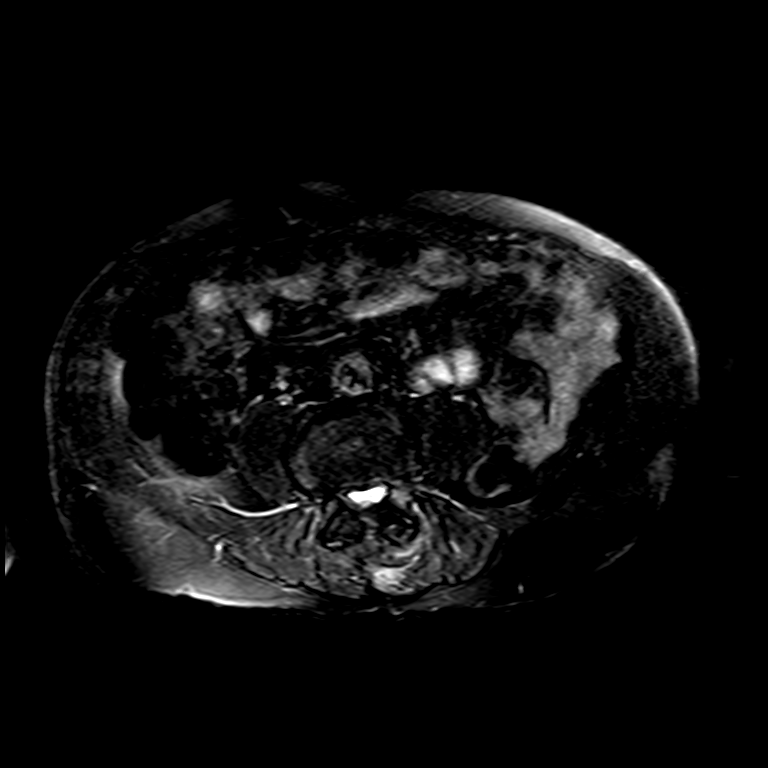
[im 48/48]
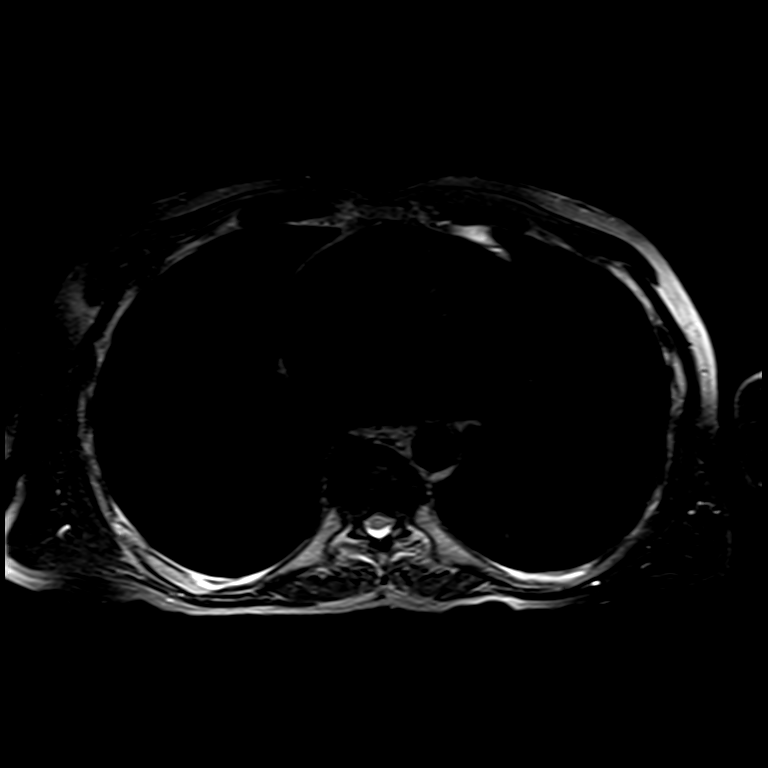

[Series 5: T1 · axial · 4.0mm · 0.56mm/px · z∈[-98,+122]mm · 2 of 56 slices shown (1 of 2)]
[im 1/56]
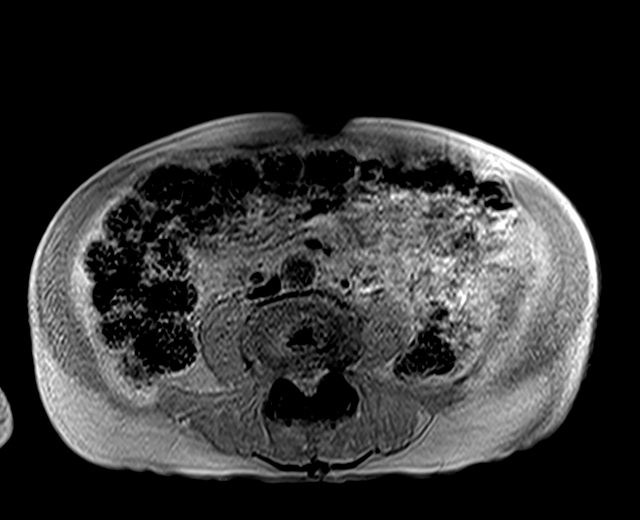
[im 56/56]
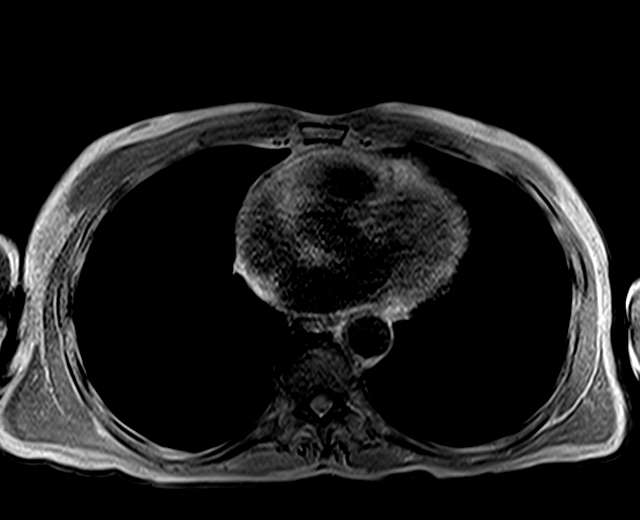

[Series 6: T1 · axial · 4.0mm · 0.56mm/px · z∈[-98,+122]mm · 2 of 56 slices shown (2 of 2)]
[im 1/56]
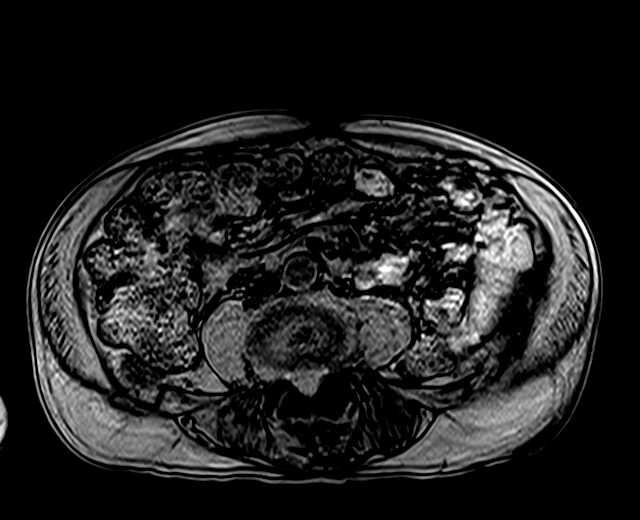
[im 56/56]
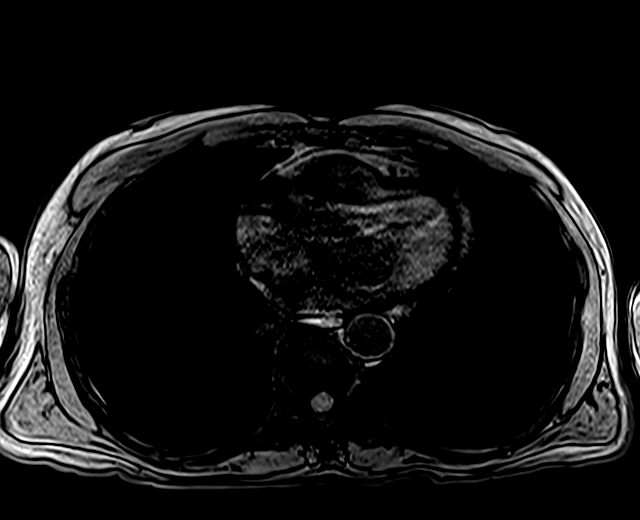

[Series 9: DWI · axial · 5.0mm · 0.91mm/px · z∈[-107,+127]mm · 3 of 80 slices shown (1 of 2)]
[im 1/80]
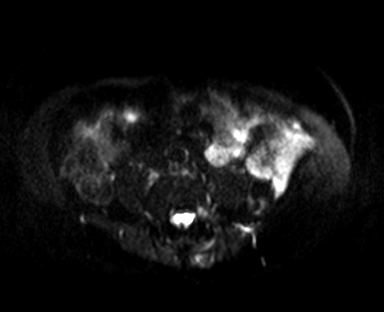
[im 40/80]
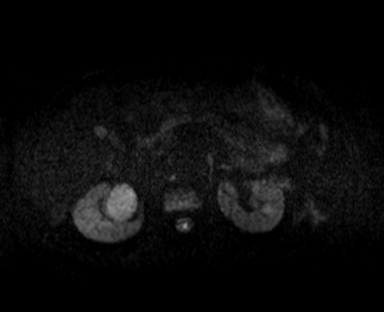
[im 80/80]
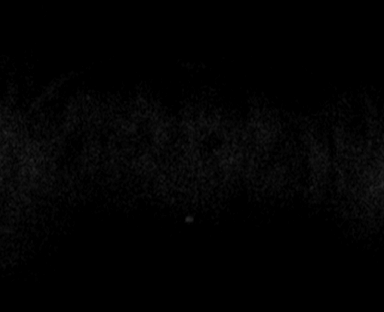

[Series 10: DWI · axial · 5.0mm · 0.91mm/px · z∈[-107,+127]mm · 2 of 40 slices shown (2 of 2)]
[im 1/40]
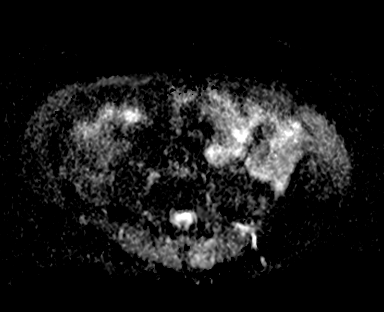
[im 40/40]
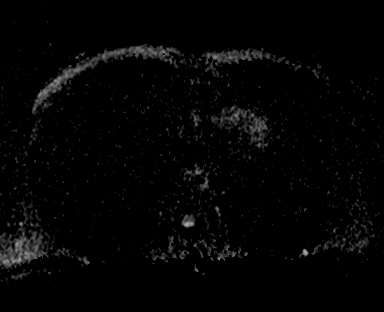

[Series 11: bSSFP · axial · 4.0mm · 0.68mm/px · z∈[-108,+128]mm · 2 of 60 slices shown]
[im 1/60]
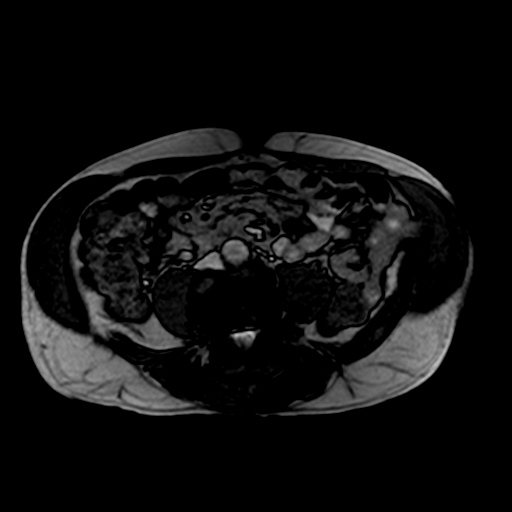
[im 60/60]
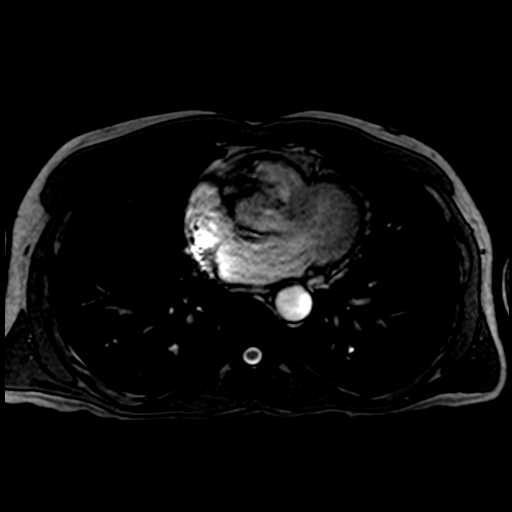

[Series 12: t1fs axial pre · axial · non-contrast · 3.0mm · 0.52mm/px · z∈[-94,+119]mm · 3 of 72 slices shown]
[im 1/72]
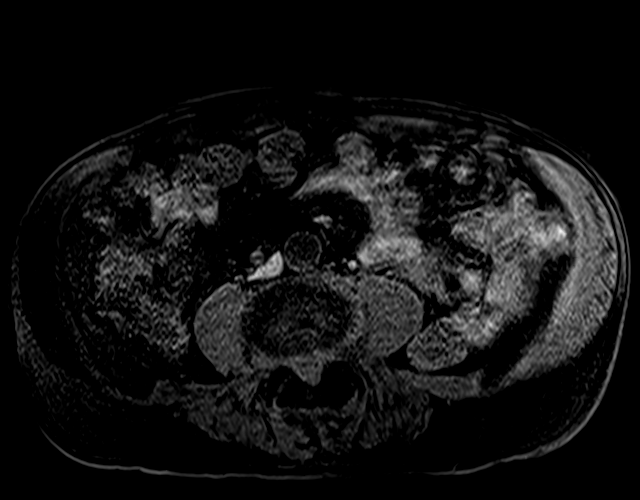
[im 36/72]
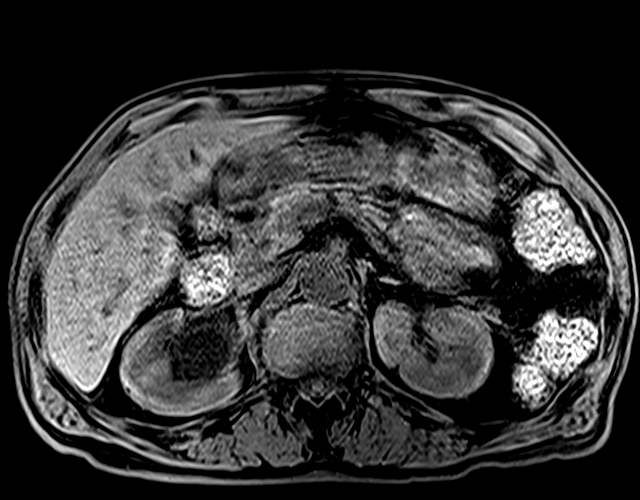
[im 72/72]
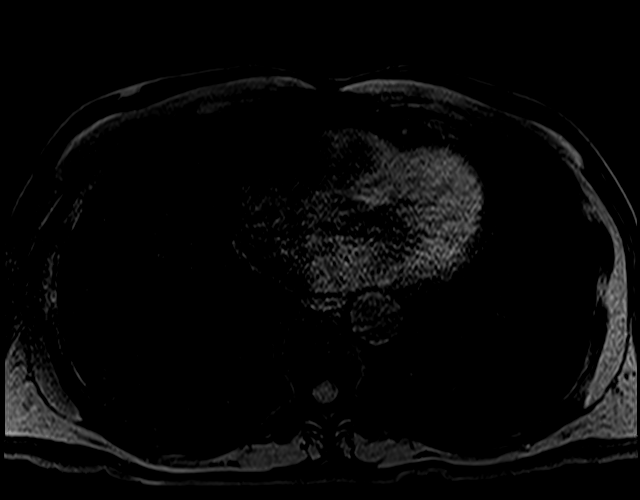

[Series 13: (id) delay · axial · delayed · 3.0mm · 0.52mm/px · 1 of 72 slices shown]
[im 1/72]
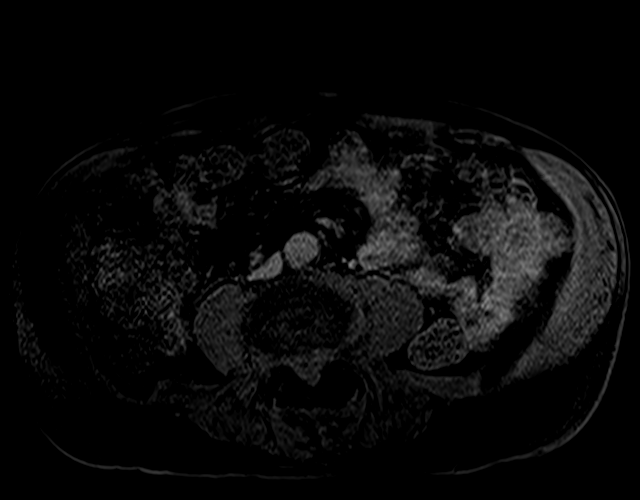

[19 of 48 positions shown; findings below may reference images not displayed]

FINDINGS: Lower chest:  Lung bases are clear.

Hepatobiliary: No focal hepatic lesion. No biliary duct dilatation.
No hepatic steatosis. Common bile duct normal. Gallbladder normal.

Pancreas: Normal pancreatic parenchymal intensity. No ductal
dilatation or inflammation.

Spleen: Normal spleen.

Adrenals/urinary tract: Large bilobed cystic lesion in the central
RIGHT kidney and extending into the hilum measuring 5.7 x 3.2 cm.
This bilobed cyst has thin intervening cyst wall without evidence of
enhancement or nodularity. Lesion ahs typical high signal intensity
on T2 weighted imaging and low signal intensity T1 weighted imaging.
No enhancing nodularity.

The several additional smaller cortical cysts are present within
LEFT and RIGHT kidney.

Stomach/Bowel: Stomach and limited of the small bowel is
unremarkable

Vascular/Lymphatic: Abdominal aortic normal caliber. No
retroperitoneal periportal lymphadenopathy.

Musculoskeletal: No aggressive osseous lesion
IMPRESSION: 1. Minimally complex nonenhancing bilobed cyst in the central RIGHT
kidney (Bosniak II, no specific follow-up recommended).
2. Additional smaller Bosniak 1 cysts of both kidneys.
3. Normal liver.

## 2017-10-18 IMAGING — DX DG CHEST 2V
2 series · 2 of 2 positions shown · non-contrast
Comparison: Chest radiograph performed 03/27/2012

CLINICAL DATA: Acute onset of intermittent generalized chest
tightness. Palpitations, shortness of breath and dizziness. Initial
encounter.

EXAM:
CHEST  2 VIEW

[chest pa]
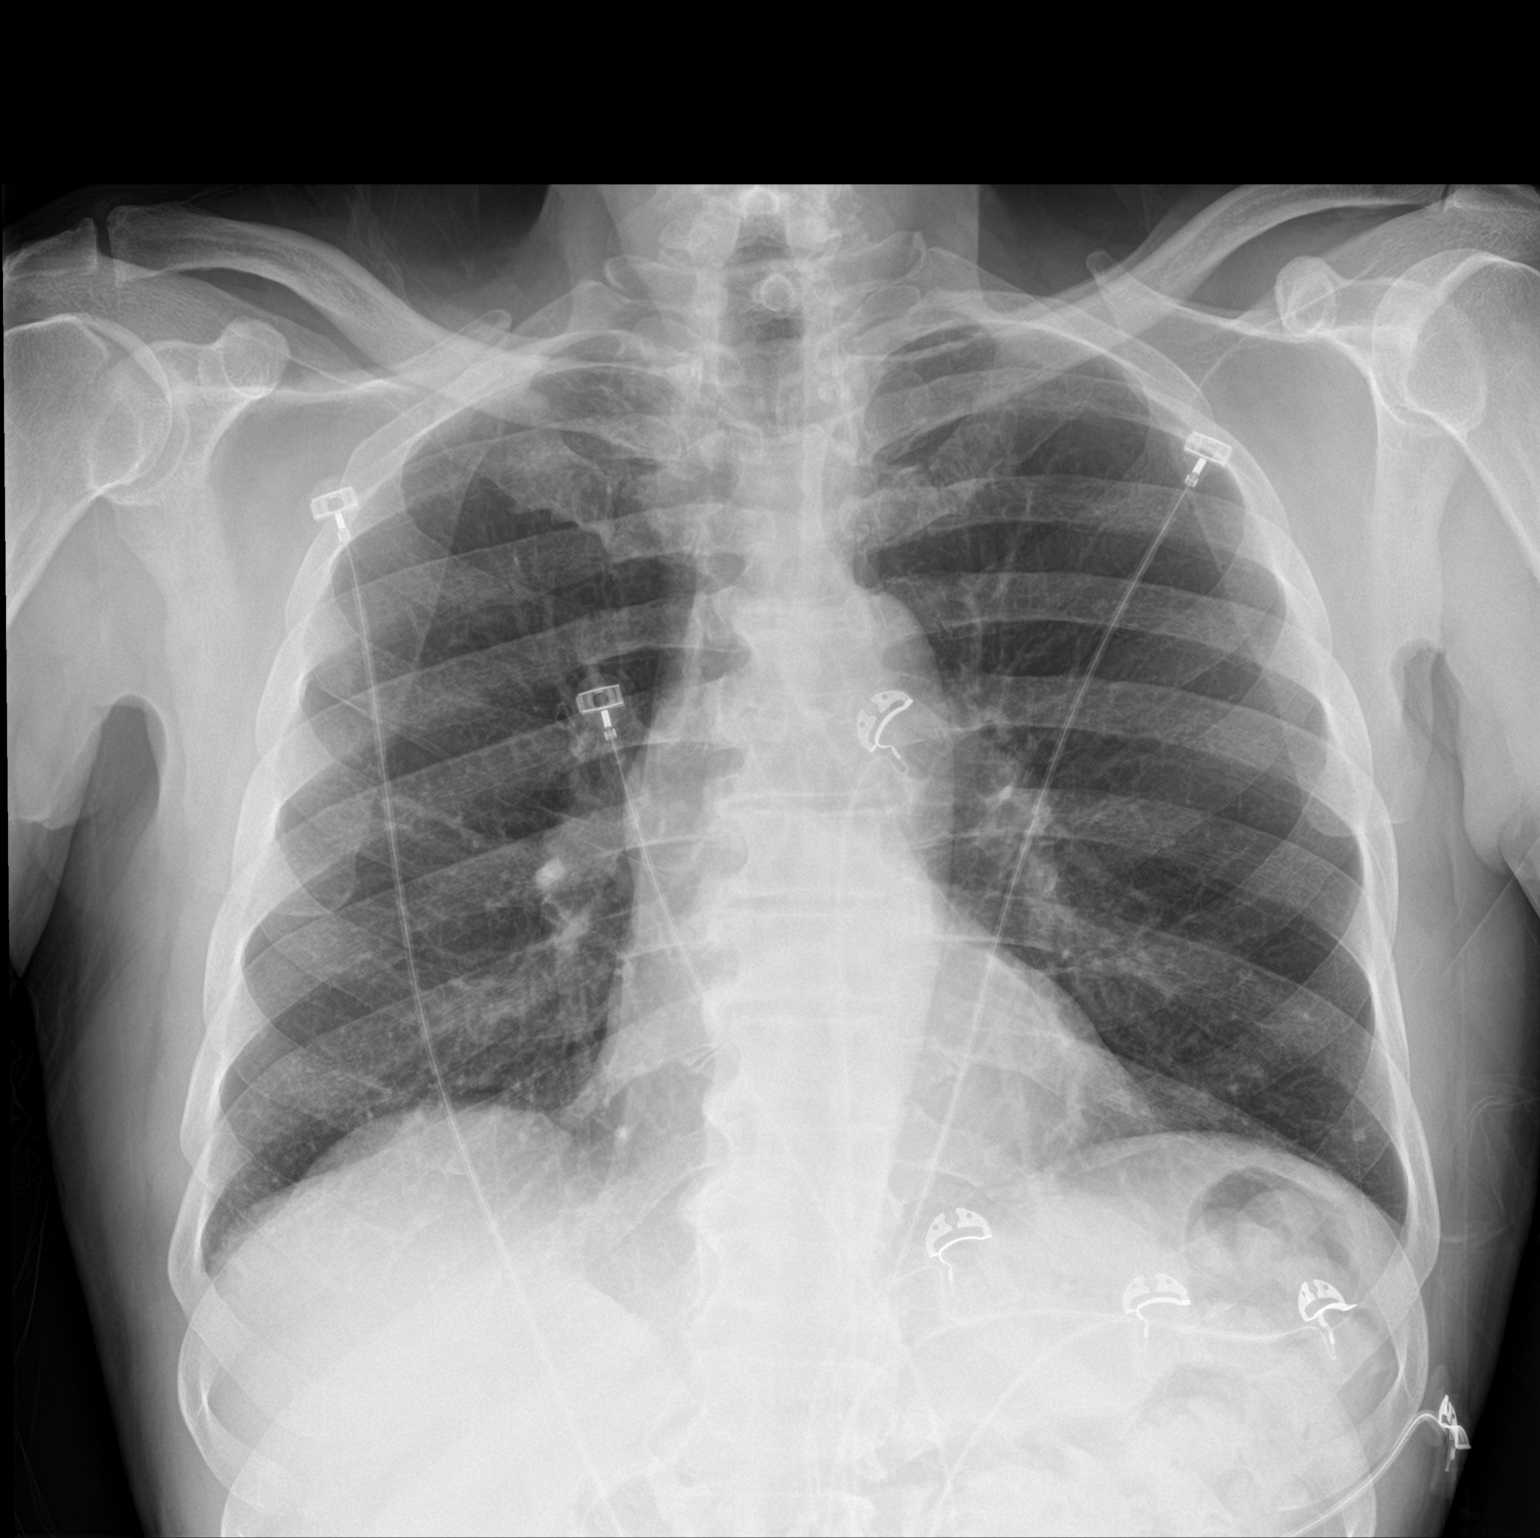

[chest lat]
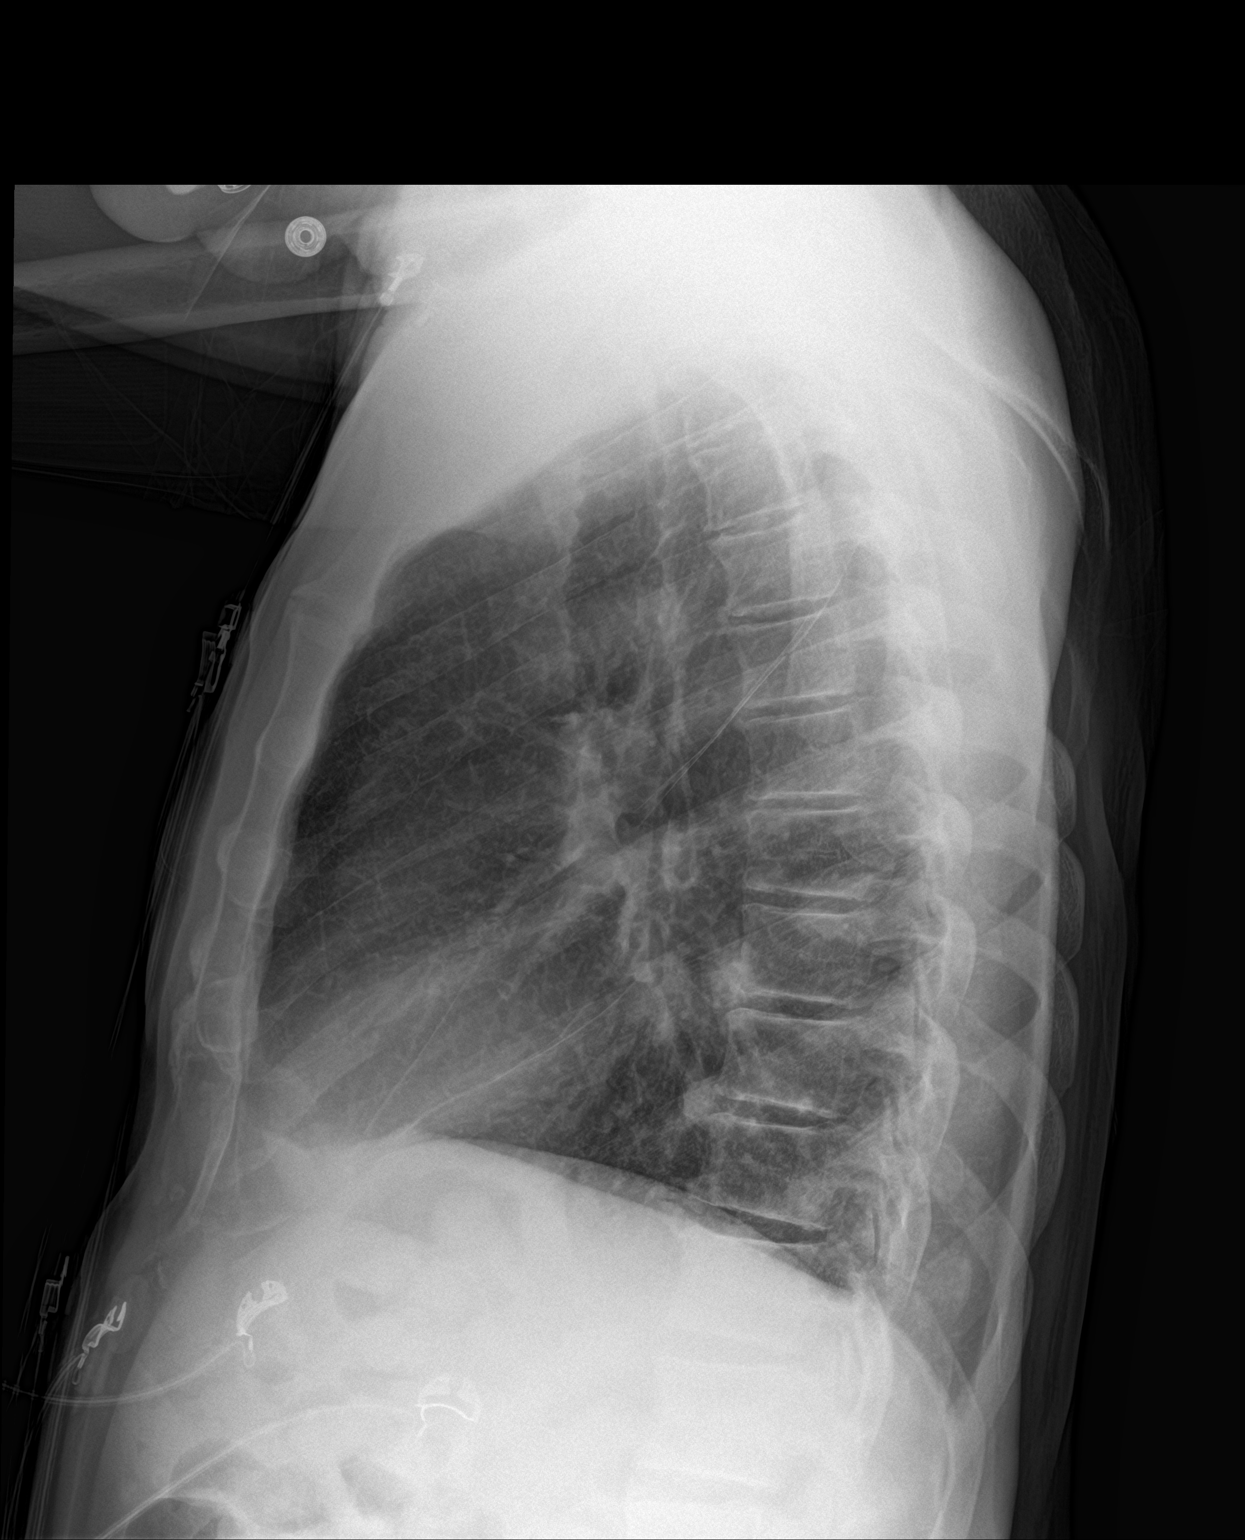

[2 of 2 positions shown; findings below may reference images not displayed]

FINDINGS: The lungs are well-aerated and clear. There is no evidence of focal
opacification, pleural effusion or pneumothorax.

The heart is normal in size; the mediastinal contour is within
normal limits. No acute osseous abnormalities are seen.
IMPRESSION: No acute cardiopulmonary process seen.

## 2018-04-17 ENCOUNTER — Ambulatory Visit: Payer: Medicare HMO | Admitting: Internal Medicine

## 2018-04-17 ENCOUNTER — Encounter: Payer: Self-pay | Admitting: Internal Medicine

## 2018-04-17 ENCOUNTER — Encounter: Payer: Self-pay | Admitting: *Deleted

## 2018-04-17 VITALS — BP 126/80 | HR 75 | Ht 68.0 in | Wt 146.0 lb

## 2018-04-17 DIAGNOSIS — I493 Ventricular premature depolarization: Secondary | ICD-10-CM | POA: Diagnosis not present

## 2018-04-17 DIAGNOSIS — I10 Essential (primary) hypertension: Secondary | ICD-10-CM | POA: Diagnosis not present

## 2018-04-17 DIAGNOSIS — E782 Mixed hyperlipidemia: Secondary | ICD-10-CM | POA: Diagnosis not present
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
## 2018-04-17 NOTE — Patient Instructions (Signed)
Medication Instructions:  Continue current medications If you need a refill on your cardiac medications before your next appointment, please call your pharmacy.   Lab work: NONE . Testing/Procedures: NONE  Follow-Up: At [facility], you and your health needs are our priority.  As part of our continuing mission to provide you with exceptional heart care, we have created designated Provider Care Teams.  These Care Teams include your primary Cardiologist (physician) and Advanced Practice Providers (APPs -  Physician Assistants and Nurse Practitioners) who all work together to provide you with the care you need, when you need it. You will need a follow up appointment in 12 months.  Please call our office 2 months in advance to schedule this appointment.  You may see Dr. Debara Pickett or one of the following Advanced Practice Providers on your designated Care Team: Almyra Deforest, [place] . Fabian Sharp, PA-C  Any Other Special Instructions Will Be Listed Below (If Applicable).

## 2018-06-11 DIAGNOSIS — M1631 Unilateral osteoarthritis resulting from hip dysplasia, right hip: Secondary | ICD-10-CM | POA: Insufficient documentation

## 2018-06-11 DIAGNOSIS — M1611 Unilateral primary osteoarthritis, right hip: Secondary | ICD-10-CM | POA: Insufficient documentation

## 2018-06-11 DIAGNOSIS — M1632 Unilateral osteoarthritis resulting from hip dysplasia, left hip: Secondary | ICD-10-CM | POA: Insufficient documentation

## 2018-06-11 DIAGNOSIS — M1612 Unilateral primary osteoarthritis, left hip: Secondary | ICD-10-CM | POA: Insufficient documentation

## 2018-12-16 ENCOUNTER — Other Ambulatory Visit: Payer: Self-pay

## 2018-12-16 ENCOUNTER — Other Ambulatory Visit: Payer: Medicare HMO

## 2018-12-16 DIAGNOSIS — Z20822 Contact with and (suspected) exposure to covid-19: Secondary | ICD-10-CM

## 2018-12-21 LAB — NOVEL CORONAVIRUS, NAA: SARS-CoV-2, NAA: NOT DETECTED

## 2019-03-09 ENCOUNTER — Other Ambulatory Visit (HOSPITAL_COMMUNITY): Payer: Self-pay | Admitting: Nurse Practitioner

## 2019-03-09 ENCOUNTER — Ambulatory Visit (HOSPITAL_COMMUNITY)
Admission: RE | Admit: 2019-03-09 | Discharge: 2019-03-09 | Disposition: A | Discharge status: Home / Self Care | Payer: Medicare HMO | Source: Ambulatory Visit | Attending: Nurse Practitioner | Admitting: Nurse Practitioner

## 2019-03-09 ENCOUNTER — Other Ambulatory Visit: Payer: Self-pay

## 2019-03-09 DIAGNOSIS — J189 Pneumonia, unspecified organism: Secondary | ICD-10-CM

## 2019-04-23 ENCOUNTER — Other Ambulatory Visit: Payer: Self-pay

## 2019-04-23 ENCOUNTER — Ambulatory Visit: Payer: Medicare HMO | Admitting: Internal Medicine

## 2019-04-23 ENCOUNTER — Encounter: Payer: Self-pay | Admitting: Internal Medicine

## 2019-04-23 VITALS — BP 149/89 | HR 82 | Temp 97.0°F | Ht 68.0 in | Wt 146.0 lb

## 2019-04-23 DIAGNOSIS — I2510 Atherosclerotic heart disease of native coronary artery without angina pectoris: Secondary | ICD-10-CM

## 2019-04-23 DIAGNOSIS — I10 Essential (primary) hypertension: Secondary | ICD-10-CM | POA: Diagnosis not present

## 2019-04-23 DIAGNOSIS — E782 Mixed hyperlipidemia: Secondary | ICD-10-CM

## 2019-04-23 NOTE — Patient Instructions (Signed)
Medication Instructions:  Your physician recommends that you continue on your current medications as directed. Please refer to the Current Medication list given to you today.  *If you need a refill on your cardiac medications before your next appointment, please call your pharmacy*  Lab Work: NONE If you have labs (blood work) drawn today and your tests are completely normal, you will receive your results only by: Marland Kitchen MyChart Message (if you have MyChart) OR . A paper copy in the mail If you have any lab test that is abnormal or we need to change your treatment, we will call you to review the results.  Testing/Procedures: NONE  Follow-Up: At [facility], you and your health needs are our priority.  As part of our continuing mission to provide you with exceptional heart care, we have created designated Provider Care Teams.  These Care Teams include your primary Cardiologist (physician) and Advanced Practice Providers (APPs -  Physician Assistants and Nurse Practitioners) who all work together to provide you with the care you need, when you need it.  Your next appointment:   12 month(s)  The format for your next appointment:   Either In Person or Virtual  Provider:   You may see Dr. Debara Pickett or one of the following Advanced Practice Providers on your designated Care Team:    Almyra Deforest, PA-C  Fabian Sharp, [place] or   Roby Lofts, [place]   Other Instructions
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
## 2019-10-02 ENCOUNTER — Emergency Department (HOSPITAL_COMMUNITY)
Admission: EM | Admit: 2019-10-02 | Discharge: 2019-10-03 | Disposition: A | Discharge status: Home / Self Care | Payer: Medicare HMO | Attending: Emergency Medicine | Admitting: Emergency Medicine

## 2019-10-02 ENCOUNTER — Other Ambulatory Visit: Payer: Self-pay

## 2019-10-02 ENCOUNTER — Emergency Department (HOSPITAL_COMMUNITY): Payer: Medicare HMO

## 2019-10-02 ENCOUNTER — Encounter (HOSPITAL_COMMUNITY): Payer: Self-pay

## 2019-10-02 DIAGNOSIS — R002 Palpitations: Secondary | ICD-10-CM

## 2019-10-02 DIAGNOSIS — Z7982 Long term (current) use of aspirin: Secondary | ICD-10-CM | POA: Diagnosis not present

## 2019-10-02 DIAGNOSIS — Z87891 Personal history of nicotine dependence: Secondary | ICD-10-CM | POA: Insufficient documentation

## 2019-10-02 DIAGNOSIS — Z79899 Other long term (current) drug therapy: Secondary | ICD-10-CM | POA: Insufficient documentation

## 2019-10-02 DIAGNOSIS — E119 Type 2 diabetes mellitus without complications: Secondary | ICD-10-CM | POA: Diagnosis not present

## 2019-10-02 DIAGNOSIS — R079 Chest pain, unspecified: Secondary | ICD-10-CM

## 2019-10-02 DIAGNOSIS — I493 Ventricular premature depolarization: Secondary | ICD-10-CM | POA: Diagnosis not present

## 2019-10-02 DIAGNOSIS — I2510 Atherosclerotic heart disease of native coronary artery without angina pectoris: Secondary | ICD-10-CM | POA: Diagnosis not present

## 2019-10-02 DIAGNOSIS — I10 Essential (primary) hypertension: Secondary | ICD-10-CM | POA: Insufficient documentation

## 2019-10-02 DIAGNOSIS — Z7984 Long term (current) use of oral hypoglycemic drugs: Secondary | ICD-10-CM | POA: Insufficient documentation

## 2019-10-02 NOTE — ED Triage Notes (Signed)
CP x3 days. Feeling a little worse today so he decided to come in.

## 2019-10-03 LAB — COMPREHENSIVE METABOLIC PANEL
ALT: 25 U/L (ref 0–44)
AST: 20 U/L (ref 15–41)
Albumin: 3.8 g/dL (ref 3.5–5.0)
Alkaline Phosphatase: 89 U/L (ref 38–126)
Anion gap: 6 (ref 5–15)
BUN: 16 mg/dL (ref 8–23)
CO2: 31 mmol/L (ref 22–32)
Calcium: 8.7 mg/dL — ABNORMAL LOW (ref 8.9–10.3)
Chloride: 101 mmol/L (ref 98–111)
Creatinine, Ser: 1.15 mg/dL (ref 0.61–1.24)
GFR calc Af Amer: >60 mL/min (ref >60)
GFR calc non Af Amer: >60 mL/min (ref >60)
Glucose, Bld: 173 mg/dL — ABNORMAL HIGH (ref 70–99)
Potassium: 4.5 mmol/L (ref 3.5–5.1)
Sodium: 138 mmol/L (ref 135–145)
Total Bilirubin: 0.7 mg/dL (ref 0.3–1.2)
Total Protein: 6.9 g/dL (ref 6.5–8.1)

## 2019-10-03 LAB — CBC
HCT: 35.7 % — ABNORMAL LOW (ref 39.0–52.0)
Hemoglobin: 11.5 g/dL — ABNORMAL LOW (ref 13.0–17.0)
MCH: 28.3 pg (ref 26.0–34.0)
MCHC: 32.2 g/dL (ref 30.0–36.0)
MCV: 87.7 fL (ref 80.0–100.0)
Platelets: 272 10*3/uL (ref 150–400)
RBC: 4.07 MIL/uL — ABNORMAL LOW (ref 4.22–5.81)
RDW: 12.6 % (ref 11.5–15.5)
WBC: 5.3 10*3/uL (ref 4.0–10.5)
nRBC: 0 % (ref 0.0–0.2)

## 2019-10-03 LAB — TROPONIN I (HIGH SENSITIVITY)
Troponin I (High Sensitivity): 4 ng/L (ref <18)
Troponin I (High Sensitivity): 4 ng/L (ref <18)

## 2019-10-03 NOTE — ED Notes (Signed)
Spoke with daughter Thalia Bloodgood to give update.
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
## 2019-10-05 ENCOUNTER — Telehealth: Payer: Self-pay | Admitting: Internal Medicine

## 2019-10-05 NOTE — Telephone Encounter (Signed)
       I went in opt's chart to see what his ER visit discharge said from 10-02-19.

## 2019-10-08 ENCOUNTER — Ambulatory Visit: Payer: Medicare HMO | Admitting: Cardiology

## 2019-10-08 ENCOUNTER — Encounter: Payer: Self-pay | Admitting: Cardiology

## 2019-10-08 ENCOUNTER — Other Ambulatory Visit: Payer: Self-pay

## 2019-10-08 VITALS — BP 136/76 | HR 63 | Ht 68.0 in | Wt 147.2 lb

## 2019-10-08 DIAGNOSIS — R002 Palpitations: Secondary | ICD-10-CM

## 2019-10-08 DIAGNOSIS — I2510 Atherosclerotic heart disease of native coronary artery without angina pectoris: Secondary | ICD-10-CM

## 2019-10-08 DIAGNOSIS — E785 Hyperlipidemia, unspecified: Secondary | ICD-10-CM

## 2019-10-08 DIAGNOSIS — I493 Ventricular premature depolarization: Secondary | ICD-10-CM

## 2019-10-08 DIAGNOSIS — E119 Type 2 diabetes mellitus without complications: Secondary | ICD-10-CM | POA: Diagnosis not present

## 2019-10-08 MED ORDER — METOPROLOL TARTRATE 25 MG PO TABS: 37.5000 mg | ORAL_TABLET | Freq: Two times a day (BID) | ORAL | 1 refills | Status: DC

## 2019-10-08 NOTE — Progress Notes (Signed)
Cardiology Office Note:    Date:  10/08/2019   ID:  OCTAVIEN FAGGART, DOB 1940/11/21, MRN [id number]  PCP:  Lemmie Evens, MD  Cardiologist:  No primary care provider on file.  Electrophysiologist:  None   Referring MD: Lemmie Evens, MD   CC: f/u from ED visit 10/03/2019  History of Present Illness:    Angel Dixon is a pleasant  79 y.o. male from [place] with a hx of CAD, DM, HLD, and symptomatic PVCs.  He had a catheterization in 2007 for chest pain that showed a 60-70% branch of the diagonal. This was treated medically. In 2009 he had a small non-ST elevation MI with a troponin of 1.13. He was restudied at that time and the branch off the diagonal had now occluded. Otherwise he had 40% mid LAD but no other significant coronary disease.  His last cath was in 2012 and showed 40-50% mCFX, 60% OM1, and 40% PDA with normal LVF.  Plan was for medical Rx. He has had problems with symptomatic PVCs in the past and has not tolerated higher doses of beta blockers.   Last week he was cutting and stacking wood, using a chainsaw.  He was able to perform the work without unusual chest pain or dyspnea but noted palpitations (extra beats) associated with chest brief chest tightness while at rest.  This went on for two days till he presented to the ED at [facility] 10/02/2019.  His troponin were negative x 2 and his EKG was unremarkable (poor tracing).  He was referred to [place] for further evaluation.  Since his ED visit he says his symptoms have abated. He also is no longer cutting and stacking wood.  Before COVID he was going to the gym and using the treadmill on a regular basis without symptoms.  In the past he has not tolerated higher doses of beta blockers.   Past Medical History:  Diagnosis Date  . Angina   . CAD (coronary artery disease)    moderate, by cath  . Diabetes mellitus   . DJD (degenerative joint disease)    lumbar lam 4/05  . Dyslipidemia   . Dysrhythmia    "palpitation", PVCs  .  GERD (gastroesophageal reflux disease)   . Hypertension   . PUD (peptic ulcer disease)    endo 03/15/10    Past Surgical History:  Procedure Laterality Date  . BACK SURGERY  2005  . CARDIAC CATHETERIZATION  02/22/2006   mild obs. CAD 60-70% narrowing in small inferior branch of 1st diagonal of LAD, smooth 10% narrowing of mid RCA (Dr. Corky Downs)  . 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 Marella Chimes)  . CARDIAC CATHETERIZATION  05/11/2011   nonobstructive CAD (Dr. Roni Bread)  . COLONOSCOPY N/A 06/13/2016   Procedure: COLONOSCOPY;  Surgeon: Rogene Houston, MD;  Location: [facility];  Service: Endoscopy;  Laterality: N/A;  1200  . HERNIA REPAIR    . Krebs   right  . LEFT HEART CATHETERIZATION WITH CORONARY ANGIOGRAM N/A 05/14/2011   Procedure: LEFT HEART CATHETERIZATION WITH CORONARY ANGIOGRAM;  Surgeon: [name], MD;  Location: [facility] CATH LAB;  Service: Cardiovascular;  Laterality: N/A;  Coronary angiogram and possible PCI  . LUMBAR LAMINECTOMY  09/2003   L2-3, 3-4, 4-5  . NM MYOCAR PERF WALL MOTION  10/03/2009   bruce myoview - normal perfusion in all regions, EF 70%, no ischemia,  low risk  . TRANSTHORACIC ECHOCARDIOGRAM  03/28/2012   EF [id number], grade 1 diastolic dysfunction, mod AV calcification, mild MR    Current Medications: Current Meds  Medication Sig  . alfuzosin (UROXATRAL) 10 MG 24 hr tablet Take 10 mg by mouth daily.   . Ascorbic Acid (VITAMIN C PO) Take 1 tablet by mouth daily.   Marland Kitchen aspirin EC 81 MG tablet Take 1 tablet (81 mg total) by mouth daily.  Marland Kitchen atorvastatin (LIPITOR) 40 MG tablet Take 40 mg by mouth daily.    . fluticasone (FLONASE) 50 MCG/ACT nasal spray Place 1 spray into both nostrils as needed for allergies or rhinitis.   Marland Kitchen glipiZIDE (GLUCOTROL XL) 5 MG 24 hr tablet Take 5 mg by mouth daily.  Marland Kitchen ibuprofen (ADVIL,MOTRIN) 800 MG tablet Take 0.5 tablets (400  mg total) by mouth 3 (three) times daily as needed.  . metFORMIN (GLUCOPHAGE) 500 MG tablet Take 500 mg by mouth 2 (two) times daily.  . metoprolol tartrate (LOPRESSOR) 25 MG tablet Take 1.5 tablets (37.5 mg total) by mouth 2 (two) times daily. (Patient taking differently: Take 37.5 mg by mouth daily. )  . pantoprazole (PROTONIX) 40 MG tablet Take 40 mg by mouth daily.    . Polyethylene Glycol 400 (BLINK TEARS OP) Apply 1 drop to eye 2 (two) times daily.     Allergies:   Lisinopril, Sulfa antibiotics, Sulfacetamide, Vioxx [rofecoxib], and Yellow jacket venom [bee venom]   Social History   Socioeconomic History  . Marital status: Married    Spouse name: Not on file  . Number of children: 4  . Years of education: 71  . Highest education level: Not on file  Occupational History  . Occupation: retired  Tobacco Use  . Smoking status: Former Smoker    Packs/day: 1.00    Years: 25.00    Pack years: 25.00    Types: Cigarettes  . Smokeless tobacco: Never Used  . Tobacco comment: quit smoking cigarettes ~ 1990  Substance and Sexual Activity  . Alcohol use: Yes    Comment: 05/11/11 "shot every now and then"  . Drug use: No  . Sexual activity: Yes  Other Topics Concern  . Not on file  Social History Narrative  . Not on file   Social Determinants of Health   Financial Resource Strain:   . Difficulty of Paying Living Expenses:   Food Insecurity:   . Worried About Charity fundraiser in the Last Year:   . Arboriculturist in the Last Year:   Transportation Needs:   . Film/video editor (Medical):   Marland Kitchen Lack of Transportation (Non-Medical):   Physical Activity:   . Days of Exercise per Week:   . Minutes of Exercise per Session:   Stress:   . Feeling of Stress :   Social Connections:   . Frequency of Communication with Friends and Family:   . Frequency of Social Gatherings with Friends and Family:   . Attends Religious Services:   . Active Member of Clubs or Organizations:   .  Attends Archivist Meetings:   Marland Kitchen Marital Status:      Family History: The patient's family history includes Coronary artery disease in his father; Heart attack in his sister; Kidney failure in his mother; Sarcoidosis in his daughter.  ROS:   Please see the history of present illness.     All other systems reviewed and are negative.  EKGs/Labs/Other Studies Reviewed:    The  following studies were reviewed today: Cath 2012  EKG:  EKG is ordered today.  The ekg ordered today demonstrates NSR-HR 77, PVCs noted  Recent Labs: 10/03/2019: ALT 25; BUN 16; Creatinine, Ser 1.15; Hemoglobin 11.5; Platelets 272; Potassium 4.5; Sodium 138  Recent Lipid Panel    Component Value Date/Time   CHOL 121 03/28/2012 0249   TRIG 222 (H) 03/28/2012 0249   HDL 30 (L) 03/28/2012 0249   CHOLHDL 4.0 03/28/2012 0249   VLDL 44 (H) 03/28/2012 0249   LDLCALC 47 03/28/2012 0249    Physical Exam:    VS:  BP 136/76   Pulse 63   Ht 5\' 8"  (1.727 m)   Wt 147 lb 3.2 oz (66.8 kg)   SpO2 99%   BMI 22.38 kg/m     Wt Readings from Last 3 Encounters:  10/08/19 147 lb 3.2 oz (66.8 kg)  10/02/19 142 lb (64.4 kg)  04/23/19 146 lb (66.2 kg)     GEN:  Well nourished, well developed in no acute distress HEENT: Normal NECK: No JVD; No carotid bruits CARDIAC: RRR, no murmurs, rubs, gallops RESPIRATORY:  Clear to auscultation without rales, wheezing or rhonchi  ABDOMEN: Soft, non-tender, non-distended MUSCULOSKELETAL:  No edema; No deformity  SKIN: Warm and dry NEUROLOGIC:  Alert and oriented x 3 PSYCHIATRIC:  Normal affect   ASSESSMENT:    Palpitations Seen in ED 10/03/2019  Symptomatic PVCs H/O symptomatic PVCs  CAD (coronary artery disease) Cath in 2007 showed 60-70 Dx branch, subsequently occluded with small NSTEMI  In 2009 Cath 05/2011 Non-obstructing disease with normal LVF;   Diabetes mellitus without complication (HCC) On oral agents  Dyslipidemia, goal LDL below 70 On statin Rx-  followed by PCP  PLAN:    I suggested Mr Tillema take an extra 12.5 mg of Metoprolol PRN for palpitations.  He feels like he "over did it" last week.  He'll let us know if his symptoms change otherwise follow up with Dr Debara Pickett as scheduled.   Medication Adjustments/Labs and Tests Ordered: Current medicines are reviewed at length with the patient today.  Concerns regarding medicines are outlined above.  Orders Placed This Encounter  Procedures  . EKG 12-Lead   No orders of the defined types were placed in this encounter.   Patient Instructions  Medication Instructions:  You may take an extra 12.5 mg (half of a 25 mg tablet) daily of the Metoprolol as needed for palpitations.   *If you need a refill on your cardiac medications before your next appointment, please call your pharmacy*   Lab Work: None ordered If you have labs (blood work) drawn today and your tests are completely normal, you will receive your results only by: Marland Kitchen MyChart Message (if you have MyChart) OR . A paper copy in the mail If you have any lab test that is abnormal or we need to change your treatment, we will call you to review the results.   Testing/Procedures: None ordered   Follow-Up: At [facility], you and your health needs are our priority.  As part of our continuing mission to provide you with exceptional heart care, we have created designated Provider Care Teams.  These Care Teams include your primary Cardiologist (physician) and Advanced Practice Providers (APPs -  Physician Assistants and Nurse Practitioners) who all work together to provide you with the care you need, when you need it.  We recommend signing up for the patient portal called "MyChart".  Sign up information is provided on this After  Visit Summary.  MyChart is used to connect with patients for Virtual Visits (Telemedicine).  Patients are able to view lab/test results, encounter notes, upcoming appointments, etc.  Non-urgent messages  can be sent to your provider as well.   To learn more about what you can do with MyChart, go to [email or web address].    Your next appointment:   Follow up with Dr. Debara Pickett in November      Signed, Larsen Zettel, [place]  10/08/2019 12:00 PM    [place]

## 2019-10-08 NOTE — Assessment & Plan Note (Signed)
>>  ASSESSMENT AND PLAN FOR DIABETES MELLITUS WITHOUT COMPLICATION (HCC) WRITTEN ON 10/08/2019 11:54 AM BY KILROY, LUKE K, PA-C  On oral agents

## 2019-10-08 NOTE — Assessment & Plan Note (Signed)
On statin Rx- followed by PCP 

## 2019-10-08 NOTE — Assessment & Plan Note (Signed)
Cath in 2007 showed 60-70 Dx branch, subsequently occluded with small NSTEMI  In 2009 Cath 05/2011 Non-obstructing disease with normal LVF;

## 2019-10-08 NOTE — Assessment & Plan Note (Signed)
On oral agents 

## 2019-10-08 NOTE — Patient Instructions (Addendum)
Medication Instructions:  You may take an extra 12.5 mg (half of a 25 mg tablet) daily of the Metoprolol as needed for palpitations.   *If you need a refill on your cardiac medications before your next appointment, please call your pharmacy*   Lab Work: None ordered If you have labs (blood work) drawn today and your tests are completely normal, you will receive your results only by: Marland Kitchen MyChart Message (if you have MyChart) OR . A paper copy in the mail If you have any lab test that is abnormal or we need to change your treatment, we will call you to review the results.   Testing/Procedures: None ordered   Follow-Up: At [facility], you and your health needs are our priority.  As part of our continuing mission to provide you with exceptional heart care, we have created designated Provider Care Teams.  These Care Teams include your primary Cardiologist (physician) and Advanced Practice Providers (APPs -  Physician Assistants and Nurse Practitioners) who all work together to provide you with the care you need, when you need it.  We recommend signing up for the patient portal called "MyChart".  Sign up information is provided on this After Visit Summary.  MyChart is used to connect with patients for Virtual Visits (Telemedicine).  Patients are able to view lab/test results, encounter notes, upcoming appointments, etc.  Non-urgent messages can be sent to your provider as well.   To learn more about what you can do with MyChart, go to [email or web address].    Your next appointment:   Follow up with Dr. Debara Pickett in November

## 2019-10-08 NOTE — Assessment & Plan Note (Signed)
Seen in ED 10/03/2019

## 2019-10-08 NOTE — Assessment & Plan Note (Signed)
H/O symptomatic PVCs

## 2020-02-18 ENCOUNTER — Ambulatory Visit: Payer: Medicare HMO | Admitting: Internal Medicine

## 2020-02-18 ENCOUNTER — Other Ambulatory Visit: Payer: Self-pay

## 2020-02-18 ENCOUNTER — Encounter: Payer: Self-pay | Admitting: Internal Medicine

## 2020-02-18 VITALS — BP 164/82 | HR 75 | Ht 68.0 in | Wt 149.6 lb

## 2020-02-18 DIAGNOSIS — E119 Type 2 diabetes mellitus without complications: Secondary | ICD-10-CM | POA: Diagnosis not present

## 2020-02-18 DIAGNOSIS — E785 Hyperlipidemia, unspecified: Secondary | ICD-10-CM

## 2020-02-18 DIAGNOSIS — R002 Palpitations: Secondary | ICD-10-CM

## 2020-02-18 DIAGNOSIS — I2510 Atherosclerotic heart disease of native coronary artery without angina pectoris: Secondary | ICD-10-CM

## 2020-02-18 NOTE — Patient Instructions (Addendum)
Medication Instructions:  Your physician recommends that you continue on your current medications as directed. Please refer to the Current Medication list given to you today.  *If you need a refill on your cardiac medications before your next appointment, please call your pharmacy*  Lab Work: NONE ordered at this time of appointment   If you have labs (blood work) drawn today and your tests are completely normal, you will receive your results only by:  [place] (if you have MyChart) OR  A paper copy in the mail If you have any lab test that is abnormal or we need to change your treatment, we will call you to review the results.  Testing/Procedures: NONE ordered at this time of appointment   Follow-Up: At [facility], you and your health needs are our priority.  As part of our continuing mission to provide you with exceptional heart care, we have created designated Provider Care Teams.  These Care Teams include your primary Cardiologist (physician) and Advanced Practice Providers (APPs -  Physician Assistants and Nurse Practitioners) who all work together to provide you with the care you need, when you need it.  We recommend signing up for the patient portal called "MyChart".  Sign up information is provided on this After Visit Summary.  MyChart is used to connect with patients for Virtual Visits (Telemedicine).  Patients are able to view lab/test results, encounter notes, upcoming appointments, etc.  Non-urgent messages can be sent to your provider as well.   To learn more about what you can do with MyChart, go to [email or web address].    Your next appointment:   1 year(s)  The format for your next appointment:   In Person  Provider:   K. Mali Hilty, MD  Other Instructions  Continue to monitor blood pressure at home and log readings.
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
## 2020-08-11 ENCOUNTER — Telehealth: Payer: Self-pay | Admitting: Internal Medicine

## 2020-08-11 NOTE — Telephone Encounter (Signed)
Patient c/o Palpitations:  High priority if patient c/o lightheadedness, shortness of breath, or chest pain  1) How long have you had palpitations/irregular HR/ Afib? Are you having the symptoms now? About a week or more palpitations- not at this time  2) Are you currently experiencing lightheadedness, SOB or CP? Chest tightness and shortness of breath off and on  3) Do you have a history of afib (atrial fibrillation) or irregular heart rhythm?   4) Have you checked your BP or HR? (document readings if available):  been good  5) Are you experiencing any other symptoms? No- pt wanted to be seen, could not come today. I made an appt for tomorrow, please call to evaluate

## 2020-08-11 NOTE — Telephone Encounter (Signed)
Spoke with pt, he reports a fluttering feeling and tightness in his chest that last a few seconds, followed by SOB and dizziness. He had the same type of palpitations 4-5 years ago. This can occur while walking or sitting still. He has had a couple of sodas but the palpitations do not happen after drinking them. He denies fainting. He has a follow up tomorrow with dr Debara Pickett and will call prior to that appointment if symptoms change.

## 2020-08-12 ENCOUNTER — Ambulatory Visit: Payer: Medicare HMO | Admitting: Internal Medicine

## 2020-08-12 ENCOUNTER — Other Ambulatory Visit: Payer: Self-pay

## 2020-08-12 ENCOUNTER — Encounter: Payer: Self-pay | Admitting: Internal Medicine

## 2020-08-12 VITALS — BP 110/65 | HR 74 | Ht 68.0 in | Wt 148.8 lb

## 2020-08-12 DIAGNOSIS — R079 Chest pain, unspecified: Secondary | ICD-10-CM

## 2020-08-12 DIAGNOSIS — I493 Ventricular premature depolarization: Secondary | ICD-10-CM

## 2020-08-12 DIAGNOSIS — I200 Unstable angina: Secondary | ICD-10-CM

## 2020-08-12 DIAGNOSIS — I25119 Atherosclerotic heart disease of native coronary artery with unspecified angina pectoris: Secondary | ICD-10-CM

## 2020-08-12 MED ORDER — NITROGLYCERIN 0.4 MG SL SUBL: 0.4000 mg | SUBLINGUAL_TABLET | SUBLINGUAL | 3 refills | Status: AC | PRN
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
## 2020-08-12 NOTE — Patient Instructions (Signed)
Medication Instructions:  Your physician recommends that you continue on your current medications as directed. Please refer to the Current Medication list given to you today.  *If you need a refill on your cardiac medications before your next appointment, please call your pharmacy*   Testing/Procedures: Dr. Debara Pickett has ordered a Lexiscan Myocardial Perfusion Imaging Study. -- [address]  Please arrive 15 minutes prior to your appointment time for registration and insurance purposes.   The test will take approximately 3 to 4 hours to complete; you may bring reading material.  If someone comes with you to your appointment, they will need to remain in the main lobby due to limited space in the testing area. **If you are pregnant or breastfeeding, please notify the nuclear lab prior to your appointment**   How to prepare for your Myocardial Perfusion Test:  Do not eat or drink 3 hours prior to your test, except you may have water.  Do not consume products containing caffeine (regular or decaffeinated) 12 hours prior to your test. (ex: coffee, chocolate, sodas, tea).  Do wear comfortable clothes (no dresses or overalls) and walking shoes, tennis shoes preferred (No heels or open toe shoes are allowed).  Do NOT wear cologne, perfume, aftershave, or lotions (deodorant is allowed).  If you use an inhaler, use it the AM of your test and bring it with you.   If you use a nebulizer, use it the AM of your test.   If these instructions are not followed, your test will have to be rescheduled.    Follow-Up: At [facility], you and your health needs are our priority.  As part of our continuing mission to provide you with exceptional heart care, we have created designated Provider Care Teams.  These Care Teams include your primary Cardiologist (physician) and Advanced Practice Providers (APPs -  Physician Assistants and Nurse Practitioners) who all work together to provide you  with the care you need, when you need it.  We recommend signing up for the patient portal called "MyChart".  Sign up information is provided on this After Visit Summary.  MyChart is used to connect with patients for Virtual Visits (Telemedicine).  Patients are able to view lab/test results, encounter notes, upcoming appointments, etc.  Non-urgent messages can be sent to your provider as well.   To learn more about what you can do with MyChart, go to [email or web address].    Your next appointment:   4 week(s)   The format for your next appointment:   In Person  Provider:   K. Mali Hilty, MD   Other Instructions

## 2020-08-18 ENCOUNTER — Telehealth (HOSPITAL_COMMUNITY): Payer: Self-pay | Admitting: *Deleted

## 2020-08-18 NOTE — Telephone Encounter (Signed)
Left message on voicemail per DPR in reference to upcoming appointment scheduled on 08/22/20 at 10:30 with detailed instructions given per Myocardial Perfusion Study Information Sheet for the test. LM to arrive 15 minutes early, and that it is imperative to arrive on time for appointment to keep from having the test rescheduled. If you need to cancel or reschedule your appointment, please call the office within 24 hours of your appointment. Failure to do so may result in a cancellation of your appointment, and a $50 no show fee. Phone number given for call back for any questions.

## 2020-08-22 ENCOUNTER — Ambulatory Visit (HOSPITAL_COMMUNITY): Payer: Medicare HMO | Attending: Cardiology

## 2020-08-22 ENCOUNTER — Other Ambulatory Visit: Payer: Self-pay

## 2020-08-22 DIAGNOSIS — I200 Unstable angina: Secondary | ICD-10-CM | POA: Diagnosis present

## 2020-08-22 DIAGNOSIS — I25119 Atherosclerotic heart disease of native coronary artery with unspecified angina pectoris: Secondary | ICD-10-CM | POA: Diagnosis not present

## 2020-08-22 LAB — MYOCARDIAL PERFUSION IMAGING
LV dias vol: 79 mL (ref 62–150)
LV sys vol: 24 mL
Peak HR: 101 {beats}/min
Rest HR: 60 {beats}/min
SDS: 0
SRS: 0
SSS: 0
TID: 0.94

## 2020-08-22 MED ORDER — TECHNETIUM TC 99M TETROFOSMIN IV KIT
32.4000 | PACK | Freq: Once | INTRAVENOUS | Status: AC | PRN
  Administered 2020-08-22: 32.4 via INTRAVENOUS
  Filled 2020-08-22: qty 33

## 2020-08-22 MED ORDER — TECHNETIUM TC 99M TETROFOSMIN IV KIT
10.1000 | PACK | Freq: Once | INTRAVENOUS | Status: AC | PRN
  Administered 2020-08-22: 10.1 via INTRAVENOUS
  Filled 2020-08-22: qty 11

## 2020-08-22 MED ORDER — REGADENOSON 0.4 MG/5ML IV SOLN
0.4000 mg | Freq: Once | INTRAVENOUS | Status: AC
Start: 2020-08-22 — End: 2020-08-22
  Administered 2020-08-22: 0.4 mg via INTRAVENOUS

## 2020-08-29 ENCOUNTER — Telehealth: Payer: Self-pay | Admitting: Internal Medicine

## 2020-08-29 NOTE — Telephone Encounter (Signed)
The patient has been notified of the result and verbalized understanding.  All questions (if any) were answered. Antonieta Iba, RN 08/29/2020 10:58 AM

## 2020-08-29 NOTE — Telephone Encounter (Signed)
Patient returning call for stress test results. 

## 2020-09-08 ENCOUNTER — Encounter (INDEPENDENT_AMBULATORY_CARE_PROVIDER_SITE_OTHER): Payer: Self-pay | Admitting: Ophthalmology

## 2020-09-08 ENCOUNTER — Ambulatory Visit (INDEPENDENT_AMBULATORY_CARE_PROVIDER_SITE_OTHER): Payer: Medicare HMO | Admitting: Ophthalmology

## 2020-09-08 ENCOUNTER — Other Ambulatory Visit: Payer: Self-pay

## 2020-09-08 DIAGNOSIS — H353134 Nonexudative age-related macular degeneration, bilateral, advanced atrophic with subfoveal involvement: Secondary | ICD-10-CM | POA: Diagnosis not present

## 2020-09-08 DIAGNOSIS — E119 Type 2 diabetes mellitus without complications: Secondary | ICD-10-CM

## 2020-09-08 DIAGNOSIS — H353133 Nonexudative age-related macular degeneration, bilateral, advanced atrophic without subfoveal involvement: Secondary | ICD-10-CM | POA: Diagnosis not present

## 2020-09-08 NOTE — Progress Notes (Signed)
09/08/2020     CHIEF COMPLAINT Patient presents for Blurred Vision (WIP blur OS > OD//Pt c/o VA OS being "bent" x 1 year approx with progressive lenses on only. Pt denies symptoms with glasses off. Pt sts OU is "a little blurry at times." Pt c/o progressive lenses near portion being too high./A1c: 7 "something" checked 03/22/LBS: 121 checked 1 week ago)   HISTORY OF PRESENT ILLNESS: Angel Dixon is a 80 y.o. male who presents to the clinic today for:   HPI    Blurred Vision    In left eye.  Onset was gradual.  Vision is blurred.  Severity is moderate.  This started 1 year ago.  Occurring constantly.  Context:  distance vision, mid-range vision and near vision.  Treatments tried include no treatments. Additional comments: WIP blur OS > OD  Pt c/o VA OS being "bent" x 1 year approx with progressive lenses on only. Pt denies symptoms with glasses off. Pt sts OU is "a little blurry at times." Pt c/o progressive lenses near portion being too high. A1c: 7 "something" checked 03/22 LBS: 121 checked 1 week ago       Last edited by Rockie Neighbours, [place] on 09/08/2020  9:33 AM. (History)      Referring physician: Lemmie Evens, MD [place],  [place] [address]  HISTORICAL INFORMATION:   Selected notes from the MEDICAL RECORD NUMBER    Lab Results  Component Value Date   HGBA1C 7.4 (H) 03/28/2012     CURRENT MEDICATIONS: Current Outpatient Medications (Ophthalmic Drugs)  Medication Sig  . Polyethylene Glycol 400 (BLINK TEARS OP) Apply 1 drop to eye 2 (two) times daily.   No current facility-administered medications for this visit. (Ophthalmic Drugs)   Current Outpatient Medications (Other)  Medication Sig  . alfuzosin (UROXATRAL) 10 MG 24 hr tablet Take 10 mg by mouth daily.   . Ascorbic Acid (VITAMIN C PO) Take 1 tablet by mouth daily.  Marland Kitchen atorvastatin (LIPITOR) 40 MG tablet Take 40 mg by mouth daily.  . fluticasone (FLONASE) 50 MCG/ACT nasal spray Place 1 spray  into both nostrils as needed for allergies or rhinitis.   Marland Kitchen glipiZIDE (GLUCOTROL XL) 5 MG 24 hr tablet Take 5 mg by mouth daily.  Marland Kitchen ibuprofen (ADVIL,MOTRIN) 800 MG tablet Take 0.5 tablets (400 mg total) by mouth 3 (three) times daily as needed.  . metFORMIN (GLUCOPHAGE) 500 MG tablet Take 500 mg by mouth 2 (two) times daily.  . metoprolol tartrate (LOPRESSOR) 25 MG tablet Take 25 mg by mouth 2 (two) times daily.  . nitroGLYCERIN (NITROSTAT) 0.4 MG SL tablet Place 1 tablet (0.4 mg total) under the tongue every 5 (five) minutes as needed for chest pain (severe chest pain or pressure only).  . pantoprazole (PROTONIX) 40 MG tablet Take 40 mg by mouth daily.   No current facility-administered medications for this visit. (Other)      REVIEW OF SYSTEMS:    ALLERGIES Allergies  Allergen Reactions  . Lisinopril Other (See Comments)    Sore throat, no obvious angioedema or cough  . Sulfa Antibiotics Hives  . Sulfacetamide Hives  . Vioxx [Rofecoxib] Hives  . [place] Venom [Bee Venom] Swelling    PAST MEDICAL HISTORY Past Medical History:  Diagnosis Date  . Angina   . CAD (coronary artery disease)    moderate, by cath  . Diabetes mellitus   . DJD (degenerative joint disease)    lumbar lam 4/05  .  Dyslipidemia   . Dysrhythmia    "palpitation", PVCs  . GERD (gastroesophageal reflux disease)   . Hypertension   . PUD (peptic ulcer disease)    endo 03/15/10   Past Surgical History:  Procedure Laterality Date  . BACK SURGERY  2005  . CARDIAC CATHETERIZATION  02/22/2006   mild obs. CAD 60-70% narrowing in small inferior branch of 1st diagonal of LAD, smooth 10% narrowing of mid RCA (Dr. Corky Downs)  . 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 Marella Chimes)  . CARDIAC CATHETERIZATION  05/11/2011   nonobstructive CAD (Dr. Roni Bread)  . COLONOSCOPY N/A 06/13/2016   Procedure: COLONOSCOPY;  Surgeon: Rogene Houston, MD;  Location: [facility];  Service: Endoscopy;  Laterality: N/A;  1200  . HERNIA REPAIR    . [place]   right  . LEFT HEART CATHETERIZATION WITH CORONARY ANGIOGRAM N/A 05/14/2011   Procedure: LEFT HEART CATHETERIZATION WITH CORONARY ANGIOGRAM;  Surgeon: [name], MD;  Location: [facility] CATH LAB;  Service: Cardiovascular;  Laterality: N/A;  Coronary angiogram and possible PCI  . LUMBAR LAMINECTOMY  09/2003   L2-3, 3-4, 4-5  . NM MYOCAR PERF WALL MOTION  10/03/2009   bruce myoview - normal perfusion in all regions, EF 70%, no ischemia, low risk  . TRANSTHORACIC ECHOCARDIOGRAM  03/28/2012   EF 85-88%, grade 1 diastolic dysfunction, mod AV calcification, mild MR    FAMILY HISTORY Family History  Problem Relation Age of Onset  . Coronary artery disease Father        died 31  . Kidney failure Mother        died 61  . Sarcoidosis Daughter        died 12  . Heart attack Sister        died of MI in her 76s    SOCIAL HISTORY Social History   Tobacco Use  . Smoking status: Former Smoker    Packs/day: 1.00    Years: 25.00    Pack years: 25.00    Types: Cigarettes  . Smokeless tobacco: Never Used  . Tobacco comment: quit smoking cigarettes ~ 1990  Substance Use Topics  . Alcohol use: Yes    Comment: 05/11/11 "shot every now and then"  . Drug use: No         OPHTHALMIC EXAM: Base Eye Exam    Visual Acuity (ETDRS)      Right Left   Dist cc 20/50 +2 20/30 -2   Dist ph cc NI NI   Correction: Glasses       Tonometry (Tonopen, 9:34 AM)      Right Left   Pressure 12 13       Pupils      Pupils Dark Light Shape React APD   Right PERRL 3 2 Round Brisk None   Left PERRL 3 2 Round Brisk None       Visual Fields (Counting fingers)      Left Right    Full    Restrictions  Partial outer inferior nasal deficiency       Extraocular Movement      Right Left    Full Full       Neuro/Psych    Oriented x3: Yes   Mood/Affect: Normal        Dilation    Both eyes: 1.0% Mydriacyl, 2.5% Phenylephrine @ 9:39 AM  Slit Lamp and Fundus Exam    Slit Lamp Exam      Right Left   Lids/Lashes Normal Normal   Conjunctiva/Sclera White and quiet White and quiet   Cornea Clear Clear   Anterior Chamber Deep and quiet Deep and quiet   Iris Round and reactive Round and reactive   Lens Centered posterior chamber intraocular lens Centered posterior chamber intraocular lens   Anterior Vitreous Normal Normal       Fundus Exam      Right Left   Posterior Vitreous Posterior vitreous detachment Posterior vitreous detachment   Disc Normal Normal   C/D Ratio 0.6 0.6   Macula Geographic atrophy approximately 7 disc areas in size centered around the foveal macular region with very little foveal pigment remaining Geographic atrophy approximately 7 disc areas in size centered around the foveal macular region with very little foveal pigment remaining   Vessels no DR no DR   Periphery Normal Normal          IMAGING AND PROCEDURES  Imaging and Procedures for 09/08/20  OCT, Retina - OU - Both Eyes       Right Eye Quality was good. Scan locations included subfoveal. Central Foveal Thickness: 303. Progression has been stable. Findings include abnormal foveal contour, inner retinal atrophy, outer retinal atrophy, central retinal atrophy.   Left Eye Quality was good. Scan locations included subfoveal. Central Foveal Thickness: 323. Progression has been stable. Findings include abnormal foveal contour, inner retinal atrophy, outer retinal atrophy.                 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 The patient advised to maintain good blood glucose control, excellent blood pressure control, and favorable levels of cholesterol, low density lipoprotein, and high density lipoproteins. Follow up in 1 year was recommended. Explained that fluctuations in visual acuity , or "out of focus", may result from large variations of blood sugar control.      ICD-10-CM   1. 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 Diabetes mellitus without complication ([place])  T51.7     1.  Slow progression of geographic atrophy OU with relatively good central acuity yet with dense para macular scotomata.  Observe as there is no treatable lesion at this time and no complications  2.  No detectable diabetic retinopathy OU  3.  Ophthalmic Meds Ordered this visit:  No orders of the defined types were placed in this encounter.      Return in about 2 years (around 09/09/2022) for DILATE OU, COLOR FP, OCT.  There are no Patient Instructions on file for this visit.   Explained the diagnoses, plan, and follow up with the patient and they expressed understanding.  Patient expressed understanding of the importance of proper follow up care.   Clent Demark Ariadna Setter M.D. Diseases & Surgery of the Retina and Vitreous [facility]ina [facility] [place] 09/08/20     Abbreviations: M myopia (nearsighted); A astigmatism; H hyperopia (farsighted); P presbyopia; Mrx spectacle prescription;  CTL  contact lenses; OD right eye; OS left eye; OU both eyes  XT exotropia; ET esotropia; PEK punctate epithelial keratitis; PEE punctate epithelial erosions; DES dry eye syndrome; MGD meibomian gland dysfunction; ATs artificial tears; PFAT's preservative free artificial tears; [place] nuclear sclerotic cataract; PSC  posterior subcapsular cataract; ERM epi-retinal membrane; PVD posterior vitreous detachment; RD retinal detachment; DM diabetes mellitus; DR diabetic retinopathy; NPDR non-proliferative diabetic retinopathy; PDR proliferative diabetic retinopathy; CSME clinically significant macular edema; DME diabetic macular edema; dbh dot blot hemorrhages; CWS cotton wool spot; POAG primary open angle glaucoma; C/D cup-to-disc ratio; HVF humphrey visual field; GVF goldmann visual field; OCT optical coherence tomography; IOP intraocular pressure; BRVO Branch retinal vein occlusion; CRVO central retinal vein occlusion; CRAO central retinal artery occlusion; BRAO branch retinal artery occlusion; RT retinal tear; SB scleral buckle; PPV pars plana vitrectomy; VH Vitreous hemorrhage; PRP panretinal laser photocoagulation; IVK intravitreal kenalog; VMT vitreomacular traction; MH Macular hole;  NVD neovascularization of the disc; NVE neovascularization elsewhere; AREDS age related eye disease study; ARMD age related macular degeneration; POAG primary open angle glaucoma; EBMD epithelial/anterior basement membrane dystrophy; ACIOL anterior chamber intraocular lens; IOL intraocular lens; PCIOL posterior chamber intraocular lens; Phaco/IOL phacoemulsification with intraocular lens placement; [place] photorefractive keratectomy; LASIK laser assisted in situ keratomileusis; HTN hypertension; DM diabetes mellitus; COPD chronic obstructive pulmonary disease

## 2020-09-08 NOTE — Assessment & Plan Note (Signed)

## 2020-09-08 NOTE — Assessment & Plan Note (Signed)
>>  ASSESSMENT AND PLAN FOR DIABETES MELLITUS WITHOUT COMPLICATION (HCC) WRITTEN ON 09/08/2020 10:19 AM BY ELNER ARLEY LABOR, MD  The patient has diabetes without any evidence of retinopathy. The patient advised to maintain good blood glucose control, excellent blood pressure control, and favorable levels of cholesterol, low density lipoprotein, and high density lipoproteins. Follow up in 1 year was recommended. Explained that fluctuations in visual acuity , or out of focus, may result from large variations of blood sugar control.

## 2020-09-08 NOTE — Assessment & Plan Note (Signed)
Geographic atrophy surrounds islands of small foveal remnants allowing for the acuity displayed here yet with parafoveal para macular scotomata   These findings reviewed with the patient

## 2020-09-15 ENCOUNTER — Other Ambulatory Visit: Payer: Self-pay

## 2020-09-15 ENCOUNTER — Ambulatory Visit: Payer: Medicare HMO | Admitting: Internal Medicine

## 2020-09-15 ENCOUNTER — Encounter: Payer: Self-pay | Admitting: Internal Medicine

## 2020-09-15 VITALS — BP 132/74 | HR 67 | Ht 68.0 in | Wt 145.0 lb

## 2020-09-15 DIAGNOSIS — I25119 Atherosclerotic heart disease of native coronary artery with unspecified angina pectoris: Secondary | ICD-10-CM

## 2020-09-15 DIAGNOSIS — R0789 Other chest pain: Secondary | ICD-10-CM | POA: Diagnosis not present

## 2020-09-15 DIAGNOSIS — R002 Palpitations: Secondary | ICD-10-CM

## 2020-09-15 NOTE — Patient Instructions (Signed)
Medication Instructions:  Your physician recommends that you continue on your current medications as directed. Please refer to the Current Medication list given to you today.  *If you need a refill on your cardiac medications before your next appointment, please call your pharmacy*   Lab Work: None ordered  Testing/Procedures: None ordered   Follow-Up: At [facility], you and your health needs are our priority.  As part of our continuing mission to provide you with exceptional heart care, we have created designated Provider Care Teams.  These Care Teams include your primary Cardiologist (physician) and Advanced Practice Providers (APPs -  Physician Assistants and Nurse Practitioners) who all work together to provide you with the care you need, when you need it.  We recommend signing up for the patient portal called "MyChart".  Sign up information is provided on this After Visit Summary.  MyChart is used to connect with patients for Virtual Visits (Telemedicine).  Patients are able to view lab/test results, encounter notes, upcoming appointments, etc.  Non-urgent messages can be sent to your provider as well.   To learn more about what you can do with MyChart, go to [email or web address].    Your next appointment:   12 month(s)  The format for your next appointment:   In Person  Provider:   K. Mali Hilty, MD
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
## 2020-10-25 ENCOUNTER — Other Ambulatory Visit: Payer: Self-pay

## 2020-10-25 ENCOUNTER — Encounter (HOSPITAL_COMMUNITY): Payer: Self-pay | Admitting: *Deleted

## 2020-10-25 ENCOUNTER — Observation Stay (HOSPITAL_COMMUNITY)
Admission: EM | Admit: 2020-10-25 | Discharge: 2020-10-26 | Disposition: A | Discharge status: Home / Self Care | Payer: Medicare HMO | Attending: Internal Medicine | Admitting: Internal Medicine

## 2020-10-25 ENCOUNTER — Emergency Department (HOSPITAL_COMMUNITY): Payer: Medicare HMO

## 2020-10-25 DIAGNOSIS — I2510 Atherosclerotic heart disease of native coronary artery without angina pectoris: Secondary | ICD-10-CM | POA: Diagnosis not present

## 2020-10-25 DIAGNOSIS — Z79899 Other long term (current) drug therapy: Secondary | ICD-10-CM | POA: Diagnosis not present

## 2020-10-25 DIAGNOSIS — R0789 Other chest pain: Secondary | ICD-10-CM | POA: Diagnosis present

## 2020-10-25 DIAGNOSIS — E785 Hyperlipidemia, unspecified: Secondary | ICD-10-CM

## 2020-10-25 DIAGNOSIS — N138 Other obstructive and reflux uropathy: Secondary | ICD-10-CM

## 2020-10-25 DIAGNOSIS — Z20822 Contact with and (suspected) exposure to covid-19: Secondary | ICD-10-CM | POA: Diagnosis not present

## 2020-10-25 DIAGNOSIS — Z9861 Coronary angioplasty status: Secondary | ICD-10-CM | POA: Insufficient documentation

## 2020-10-25 DIAGNOSIS — Z7984 Long term (current) use of oral hypoglycemic drugs: Secondary | ICD-10-CM | POA: Diagnosis not present

## 2020-10-25 DIAGNOSIS — N400 Enlarged prostate without lower urinary tract symptoms: Secondary | ICD-10-CM

## 2020-10-25 DIAGNOSIS — E119 Type 2 diabetes mellitus without complications: Secondary | ICD-10-CM | POA: Diagnosis not present

## 2020-10-25 DIAGNOSIS — E1165 Type 2 diabetes mellitus with hyperglycemia: Secondary | ICD-10-CM | POA: Diagnosis not present

## 2020-10-25 DIAGNOSIS — Z87891 Personal history of nicotine dependence: Secondary | ICD-10-CM | POA: Diagnosis not present

## 2020-10-25 DIAGNOSIS — N401 Enlarged prostate with lower urinary tract symptoms: Secondary | ICD-10-CM

## 2020-10-25 DIAGNOSIS — I10 Essential (primary) hypertension: Secondary | ICD-10-CM | POA: Diagnosis not present

## 2020-10-25 DIAGNOSIS — K219 Gastro-esophageal reflux disease without esophagitis: Secondary | ICD-10-CM

## 2020-10-25 LAB — BASIC METABOLIC PANEL
Anion gap: 6 (ref 5–15)
BUN: 22 mg/dL (ref 8–23)
CO2: 31 mmol/L (ref 22–32)
Calcium: 9.1 mg/dL (ref 8.9–10.3)
Chloride: 102 mmol/L (ref 98–111)
Creatinine, Ser: 1.34 mg/dL — ABNORMAL HIGH (ref 0.61–1.24)
GFR, Estimated: 54 mL/min — ABNORMAL LOW (ref >60)
Glucose, Bld: 136 mg/dL — ABNORMAL HIGH (ref 70–99)
Potassium: 4.1 mmol/L (ref 3.5–5.1)
Sodium: 139 mmol/L (ref 135–145)

## 2020-10-25 LAB — CBC
HCT: 34.2 % — ABNORMAL LOW (ref 39.0–52.0)
Hemoglobin: 10.8 g/dL — ABNORMAL LOW (ref 13.0–17.0)
MCH: 28 pg (ref 26.0–34.0)
MCHC: 31.6 g/dL (ref 30.0–36.0)
MCV: 88.6 fL (ref 80.0–100.0)
Platelets: 257 10*3/uL (ref 150–400)
RBC: 3.86 MIL/uL — ABNORMAL LOW (ref 4.22–5.81)
RDW: 12.6 % (ref 11.5–15.5)
WBC: 4.7 10*3/uL (ref 4.0–10.5)
nRBC: 0 % (ref 0.0–0.2)

## 2020-10-25 LAB — RESP PANEL BY RT-PCR (FLU A&B, COVID) ARPGX2
Influenza A by PCR: NEGATIVE
Influenza B by PCR: NEGATIVE
SARS Coronavirus 2 by RT PCR: NEGATIVE

## 2020-10-25 LAB — TROPONIN I (HIGH SENSITIVITY)
Troponin I (High Sensitivity): 3 ng/L (ref <18)
Troponin I (High Sensitivity): 3 ng/L (ref <18)

## 2020-10-25 MED ORDER — ATORVASTATIN CALCIUM 40 MG PO TABS
40.0000 mg | ORAL_TABLET | Freq: Every day | ORAL | Status: DC
  Administered 2020-10-26: 40 mg via ORAL
  Filled 2020-10-25: qty 1

## 2020-10-25 MED ORDER — METOPROLOL TARTRATE 25 MG PO TABS
25.0000 mg | ORAL_TABLET | Freq: Two times a day (BID) | ORAL | Status: DC
  Administered 2020-10-25 – 2020-10-26 (×2): 25 mg via ORAL
  Filled 2020-10-25 (×2): qty 1

## 2020-10-25 MED ORDER — ALFUZOSIN HCL ER 10 MG PO TB24
10.0000 mg | ORAL_TABLET | Freq: Every day | ORAL | Status: DC
  Administered 2020-10-26: 10 mg via ORAL
  Filled 2020-10-25 (×3): qty 1

## 2020-10-25 MED ORDER — NITROGLYCERIN 0.4 MG SL SUBL: 0.4000 mg | SUBLINGUAL_TABLET | SUBLINGUAL | Status: DC | PRN

## 2020-10-25 MED ORDER — PANTOPRAZOLE SODIUM 40 MG PO TBEC: 40.0000 mg | DELAYED_RELEASE_TABLET | Freq: Every day | ORAL | Status: DC

## 2020-10-25 MED ORDER — ENOXAPARIN SODIUM 40 MG/0.4ML IJ SOSY
40.0000 mg | PREFILLED_SYRINGE | INTRAMUSCULAR | Status: DC
  Administered 2020-10-25: 40 mg via SUBCUTANEOUS
  Filled 2020-10-25: qty 0.4

## 2020-10-25 MED ORDER — ASPIRIN EC 81 MG PO TBEC
81.0000 mg | DELAYED_RELEASE_TABLET | Freq: Every day | ORAL | Status: DC
  Administered 2020-10-26: 81 mg via ORAL
  Filled 2020-10-25: qty 1

## 2020-10-25 NOTE — H&P (Addendum)
History and Physical  Angel Dixon KJZ:[id number] DOB: 04-19-1941 DOA: 10/25/2020  Referring physician: Roque Lias PCP: Lemmie Evens, MD  Patient coming from: Home  Chief Complaint: Chest pain  HPI: Angel Dixon is a 80 y.o. male with medical history significant for T2DM, CAD, hypertension, hyperlipidemia, BPH who presents to the emergency department due to left-sided pressure-like chest pain which started around 4:30 PM today.  Patient states that he was helping his son to hold a door while he was fixing it when the chest pain started, it was associated with some dizziness and shortness of breath, he took 3 sublingual nitroglycerin at the same time and was brought to the emergency department by his son.  Chest pain was nonradiating and was nonreproducible, it was rated as 8/10 on pain scale.  Patient denies diaphoresis, nausea, vomiting or abdominal pain.  Chest pain has since decreased to 1/10 on pain scale.  ED Course:  In the emergency department, BP was 92/68.  Work-up in the ED showed normocytic anemia, BMP showed hyperglycemia, BUN/Cr 22/1.34 (baseline creatinine at 1.0-1.2).  HS Troponin x2 was flat at 3.   Chest x-ray showed no acute chest findings. Hospitalist was asked to admit patient for further evaluation and management.  Review of Systems: Constitutional: Negative for chills and fever.  HENT: Negative for ear pain and sore throat.   Eyes: Negative for pain and visual disturbance.  Respiratory: Negative for cough, chest tightness and shortness of breath.   Cardiovascular: Positive for chest pain and negative for palpitations.  Gastrointestinal: Negative for abdominal pain and vomiting.  Endocrine: Negative for polyphagia and polyuria.  Genitourinary: Negative for decreased urine volume, dysuria, enuresis Musculoskeletal: Negative for arthralgias and back pain.  Skin: Negative for color change and rash.  Allergic/Immunologic: Negative for  immunocompromised state.  Neurological: Negative for tremors, syncope, speech difficulty, weakness and headaches.  Hematological: Does not bruise/bleed easily.  All other systems reviewed and are negative   Past Medical History:  Diagnosis Date  . Angina   . CAD (coronary artery disease)    moderate, by cath  . Diabetes mellitus   . DJD (degenerative joint disease)    lumbar lam 4/05  . Dyslipidemia   . Dysrhythmia    "palpitation", PVCs  . GERD (gastroesophageal reflux disease)   . Hypertension   . PUD (peptic ulcer disease)    endo 03/15/10   Past Surgical History:  Procedure Laterality Date  . BACK SURGERY  2005  . CARDIAC CATHETERIZATION  02/22/2006   mild obs. CAD 60-70% narrowing in small inferior branch of 1st diagonal of LAD, smooth 10% narrowing of mid RCA (Dr. Corky Downs)  . 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 Marella Chimes)  . CARDIAC CATHETERIZATION  05/11/2011   nonobstructive CAD (Dr. Roni Bread)  . COLONOSCOPY N/A 06/13/2016   Procedure: COLONOSCOPY;  Surgeon: Rogene Houston, MD;  [facility];  Service: Endoscopy;  Laterality: N/A;  1200  . HERNIA REPAIR    . [place]   right  . LEFT HEART CATHETERIZATION WITH CORONARY ANGIOGRAM N/A 05/14/2011   Procedure: LEFT HEART CATHETERIZATION WITH CORONARY ANGIOGRAM;  Surgeon: [name], MD;  Location: [facility] CATH LAB;  Service: Cardiovascular;  Laterality: N/A;  Coronary angiogram and possible PCI  . LUMBAR LAMINECTOMY  09/2003   L2-3, 3-4, 4-5  . NM MYOCAR PERF WALL MOTION  10/03/2009   bruce  myoview - normal perfusion in all regions, EF 70%, no ischemia, low risk  . TRANSTHORACIC ECHOCARDIOGRAM  03/28/2012   EF 16-10%, grade 1 diastolic dysfunction, mod AV calcification, mild MR    Social History:  reports that he has quit smoking. His smoking use included cigarettes. He has a 25.00 pack-year smoking history.  He has never used smokeless tobacco. He reports current alcohol use. He reports that he does not use drugs.   Allergies  Allergen Reactions  . Lisinopril Other (See Comments)    Sore throat, no obvious angioedema or cough  . Sulfa Antibiotics Hives  . Sulfacetamide Hives  . Vioxx [Rofecoxib] Hives  . Yellow Jacket Venom [Bee Venom] Swelling    Family History  Problem Relation Age of Onset  . Coronary artery disease Father        died 80  . Kidney failure Mother        died 35  . Sarcoidosis Daughter        died 25  . Heart attack Sister        died of MI in her 30s     Prior to Admission medications   Medication Sig Start Date End Date Taking? Authorizing Provider  alfuzosin (UROXATRAL) 10 MG 24 hr tablet Take 10 mg by mouth daily.  12/11/12   [provider]  Ascorbic Acid (VITAMIN C PO) Take 1 tablet by mouth daily.    [provider]  atorvastatin (LIPITOR) 40 MG tablet Take 40 mg by mouth daily.    [provider]  fluticasone (FLONASE) 50 MCG/ACT nasal spray Place 1 spray into both nostrils as needed for allergies or rhinitis.  09/20/14   [provider]  glipiZIDE (GLUCOTROL) 5 MG tablet Take by mouth 2 (two) times daily.    [provider]  ibuprofen (ADVIL,MOTRIN) 800 MG tablet Take 0.5 tablets (400 mg total) by mouth 3 (three) times daily as needed. 06/13/16   Rogene Houston, MD  meclizine (ANTIVERT) 25 MG tablet Take 25 mg by mouth as needed. 04/14/20   [provider]  metFORMIN (GLUCOPHAGE) 500 MG tablet Take 500 mg by mouth 2 (two) times daily.    [provider]  metoprolol tartrate (LOPRESSOR) 25 MG tablet Take 25 mg by mouth 2 (two) times daily.    [provider]  nitroGLYCERIN (NITROSTAT) 0.4 MG SL tablet Place 1 tablet (0.4 mg total) under the tongue every 5 (five) minutes as needed for chest pain (severe chest pain or pressure only). 08/12/20 05/19/29  Hilty, Nadean Corwin, MD  [facility] ULTRA  test strip  08/23/20   [provider]  pantoprazole (PROTONIX) 40 MG tablet Take 40 mg by mouth daily.    [provider]  Polyethylene Glycol 400 (BLINK TEARS OP) Place 1 drop into both eyes 2 (two) times daily.    [provider]    Physical Exam: BP (!) 151/78   Pulse (!) 55   Temp 98.3 F (36.8 C) (Oral)   Resp 15   Ht 5\' 8"  (1.727 m)   Wt 64.4 kg   SpO2 97%   BMI 21.59 kg/m   . General: 80 y.o. year-old male well developed well nourished in no acute distress.  Alert and oriented x3. Marland Kitchen HEENT: NCAT, EOMI . Neck: Supple, trachea medial . Cardiovascular: Regular rate and rhythm with no rubs or gallops.  No thyromegaly or JVD noted.  No lower extremity edema. 2/4 pulses in all 4 extremities. Marland Kitchen Respiratory: Clear  to auscultation with no wheezes or rales. Good inspiratory effort. . Abdomen: Soft, nontender nondistended with normal bowel sounds x4 quadrants. . Muskuloskeletal: No cyanosis, clubbing or edema noted bilaterally . Neuro: CN II-XII intact, strength 5/5 x 4, sensation, reflexes intact . Skin: No ulcerative lesions noted or rashes . Psychiatry: Judgement and insight appear normal. Mood is appropriate for condition and setting          Labs on Admission:  Basic Metabolic Panel: Recent Labs  Lab 10/25/20 1701  NA 139  K 4.1  CL 102  CO2 31  GLUCOSE 136*  BUN 22  CREATININE 1.34*  CALCIUM 9.1   Liver Function Tests: No results for input(s): AST, ALT, ALKPHOS, BILITOT, PROT, ALBUMIN in the last 168 hours. No results for input(s): LIPASE, AMYLASE in the last 168 hours. No results for input(s): AMMONIA in the last 168 hours. CBC: Recent Labs  Lab 10/25/20 1701  WBC 4.7  HGB 10.8*  HCT 34.2*  MCV 88.6  PLT 257   Cardiac Enzymes: No results for input(s): CKTOTAL, CKMB, CKMBINDEX, TROPONINI in the last 168 hours.  BNP (last 3 results) No results for input(s): BNP in the last 8760 hours.  ProBNP (last 3 results) No results for  input(s): PROBNP in the last 8760 hours.  CBG: No results for input(s): GLUCAP in the last 168 hours.  Radiological Exams on Admission: DG Chest Portable 1 View  Result Date: 10/25/2020 CLINICAL DATA:  Left-sided chest pain and shortness of breath. EXAM: PORTABLE CHEST 1 VIEW COMPARISON:  10/02/2019 FINDINGS: The cardiomediastinal contours are normal. The lungs are clear. Again seen calcified granuloma at the left lung base, benign. Pulmonary vasculature is normal. No consolidation, pleural effusion, or pneumothorax. No acute osseous abnormalities are seen. IMPRESSION: No acute chest findings. Electronically Signed   By: Keith Rake M.D.   On: 10/25/2020 18:04    EKG: I independently viewed the EKG done and my findings are as followed: Normal sinus rhythm at a rate of 73 bpm  Assessment/Plan Present on Admission: . Atypical chest pain . Dyslipidemia . CAD (coronary artery disease)  Principal Problem:   Atypical chest pain Active Problems:   CAD (coronary artery disease)   Diabetes mellitus without complication ([place])   Essential hypertension   Dyslipidemia   Hyperglycemia due to diabetes mellitus (HCC)   GERD (gastroesophageal reflux disease)   BPH (benign prostatic hyperplasia)   Atypical chest pain rule out ACS Heart score = 5 Chest pain was relieved with nitroglycerin Continue telemetry HS troponins x 2 was flat at 3  EKG personally reviewed showed normal sinus rhythm at a rate of 73 bpm Cardiology will be consulted to help decide if Stress test is needed in am Versus other diagnostic modalities.    Continue aspirin, nitroglycerin  Hyperglycemia secondary to T2DM Continue ISS and hypoglycemic protocol Glipizide and metformin will be held at this time  Essential hypertension (uncontrolled) Continue Lopressor  Hyperlipidemia Continue Lipitor  CAD Continue nitroglycerin, aspirin, Lipitor  GERD Continue Protonix  BPH Continue alfuzosin  DVT prophylaxis:  Continue Lovenox  Code Status: Full code  Family Communication: None at bedside  Disposition Plan:  Patient is from:                        home Anticipated DC to:                   SNF or family members home Anticipated DC date:  2-3 days Anticipated DC barriers:         Patient requires inpatient management to rule out ACS    Consults called: None  Admission status: Observation   Bernadette Hoit MD [facility]  10/25/2020, 9:20 PM

## 2020-10-25 NOTE — ED Triage Notes (Signed)
Pt with left sided CP with SOB x 35 minutes.  Pt took 3 SL NTG at one time.  bp in triage was low.

## 2020-10-25 NOTE — ED Provider Notes (Signed)
[facility] Provider Note   CSN: [id number] Arrival date & time: 10/25/20  1640     History Chief Complaint  Patient presents with  . Chest Pain    Angel Dixon is a 80 y.o. male.  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  Patient states that he the pain is tight and squeezing on the left side of his chest, does not radiate, is associated with shortness of breath, denies diaphoresis or nausea.  Patient took 3 nitro and came to the emergency room.  States he feels a little swimmy headed at this time.  Pain is constant.  No other complaints or concerns.     HPI: A 80 year old patient with a history of treated diabetes, hypertension and hypercholesterolemia presents for evaluation of chest pain. Initial onset of pain was approximately 3-6 hours ago. The patient's chest pain is described as heaviness/pressure/tightness, is not worse with exertion and is relieved by nitroglycerin. The patient's chest pain is middle- or left-sided, is not well-localized, is not sharp and does not radiate to the arms/jaw/neck. The patient does not complain of nausea and denies diaphoresis. The patient has no history of stroke, has no history of peripheral artery disease, has not smoked in the past 90 days, has no relevant family history of coronary artery disease (first degree relative at less than age 42) and does not have an elevated BMI (>=30).   Past Medical History:  Diagnosis Date  . Angina   . CAD (coronary artery disease)    moderate, by cath  . Diabetes mellitus   . DJD (degenerative joint disease)    lumbar lam 4/05  . Dyslipidemia   . Dysrhythmia    "palpitation", PVCs  . GERD (gastroesophageal reflux disease)   . Hypertension   . PUD (peptic ulcer disease)    endo 03/15/10    Patient Active  Problem List   Diagnosis Date Noted  . 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 Dyslipidemia, goal LDL below 70 08/28/2017  . Degeneration of lumbar intervertebral disc 06/22/2017  . Lumbar post-laminectomy syndrome 06/22/2017  . Low back pain 06/11/2017  . Pseudophakia 08/07/2016  . Atypical chest pain 07/06/2016  . History of colonic polyps 03/14/2016  . NSVT (nonsustained ventricular tachycardia) ([place]) 04/02/2014  . Chest pain 03/28/2012  . Palpitations 05/13/2011  . 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 Acute coronary syndrome ([place]) 05/11/2011  . CAD (coronary artery disease) 05/11/2011  . Diabetes mellitus without complication ([place]) 30/86/5784  . Hypertensive disorder 05/11/2011  . Dyslipidemia 05/11/2011  . Family history of early CAD 05/11/2011    Past Surgical History:  Procedure Laterality Date  . BACK SURGERY  2005  . CARDIAC CATHETERIZATION  02/22/2006   mild obs. CAD 60-70% narrowing in small inferior branch of 1st diagonal of LAD, smooth 10% narrowing of mid RCA (Dr. Corky Downs)  . 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 Marella Chimes)  . CARDIAC CATHETERIZATION  05/11/2011   nonobstructive CAD (Dr. Roni Bread)  . COLONOSCOPY N/A 06/13/2016   Procedure: COLONOSCOPY;  Surgeon: Rogene Houston, MD;  Location: [facility];  Service: Endoscopy;  Laterality: N/A;  1200  . HERNIA REPAIR    . [place]   right  . LEFT HEART  CATHETERIZATION WITH CORONARY ANGIOGRAM N/A 05/14/2011   Procedure: LEFT HEART CATHETERIZATION WITH CORONARY ANGIOGRAM;  Surgeon: [name], MD;  Location: [facility] CATH LAB;  Service: Cardiovascular;  Laterality: N/A;  Coronary angiogram and possible PCI  . LUMBAR LAMINECTOMY  09/2003   L2-3, 3-4, 4-5  . NM MYOCAR PERF WALL MOTION  10/03/2009   bruce myoview - normal perfusion in all regions, EF 70%, no  ischemia, low risk  . TRANSTHORACIC ECHOCARDIOGRAM  03/28/2012   EF 29-47%, grade 1 diastolic dysfunction, mod AV calcification, mild MR       Family History  Problem Relation Age of Onset  . Coronary artery disease Father        died 89  . Kidney failure Mother        died 78  . Sarcoidosis Daughter        died 34  . Heart attack Sister        died of MI in her 30s    Social History   Tobacco Use  . Smoking status: Former Smoker    Packs/day: 1.00    Years: 25.00    Pack years: 25.00    Types: Cigarettes  . Smokeless tobacco: Never Used  . Tobacco comment: quit smoking cigarettes ~ 1990  Substance Use Topics  . Alcohol use: Yes    Comment: 05/11/11 "shot every now and then"  . Drug use: No    Home Medications Prior to Admission medications   Medication Sig Start Date End Date Taking? Authorizing Provider  alfuzosin (UROXATRAL) 10 MG 24 hr tablet Take 10 mg by mouth daily.  12/11/12   [provider]  Ascorbic Acid (VITAMIN C PO) Take 1 tablet by mouth daily.    [provider]  atorvastatin (LIPITOR) 40 MG tablet Take 40 mg by mouth daily.    [provider]  fluticasone (FLONASE) 50 MCG/ACT nasal spray Place 1 spray into both nostrils as needed for allergies or rhinitis.  09/20/14   [provider]  glipiZIDE (GLUCOTROL) 5 MG tablet Take by mouth 2 (two) times daily.    [provider]  ibuprofen (ADVIL,MOTRIN) 800 MG tablet Take 0.5 tablets (400 mg total) by mouth 3 (three) times daily as needed. 06/13/16   Rogene Houston, MD  meclizine (ANTIVERT) 25 MG tablet Take 25 mg by mouth as needed. 04/14/20   [provider]  metFORMIN (GLUCOPHAGE) 500 MG tablet Take 500 mg by mouth 2 (two) times daily.    [provider]  metoprolol tartrate (LOPRESSOR) 25 MG tablet Take 25 mg by mouth 2 (two) times daily.    [provider]  nitroGLYCERIN (NITROSTAT) 0.4 MG SL tablet Place 1 tablet (0.4 mg total) under  the tongue every 5 (five) minutes as needed for chest pain (severe chest pain or pressure only). 08/12/20 05/19/29  Hilty, Nadean Corwin, MD  [facility] ULTRA test strip  08/23/20   [provider]  pantoprazole (PROTONIX) 40 MG tablet Take 40 mg by mouth daily.    [provider]  Polyethylene Glycol 400 (BLINK TEARS OP) Place 1 drop into both eyes 2 (two) times daily.    [provider]    Allergies    Lisinopril, Sulfa antibiotics, Sulfacetamide, Vioxx [rofecoxib], and Yellow jacket venom [bee venom]  Review of Systems   Review of Systems  Constitutional: Negative for chills and fever.  Respiratory: Positive for chest tightness and shortness of breath.   Cardiovascular: Positive for chest pain. Negative for  palpitations and leg swelling.  Gastrointestinal: Negative for abdominal pain, nausea and vomiting.  Musculoskeletal: Negative for arthralgias and myalgias.  Skin: Negative for rash and wound.  Allergic/Immunologic: Positive for immunocompromised state.  Neurological: Negative for weakness.  Hematological: Negative for adenopathy.  Psychiatric/Behavioral: Negative for confusion.  All other systems reviewed and are negative.   Physical Exam Updated Vital Signs BP 140/76   Pulse 60   Temp 98.3 F (36.8 C) (Oral)   Resp 13   Ht 5\' 8"  (1.727 m)   Wt 64.4 kg   SpO2 100%   BMI 21.59 kg/m   Physical Exam Vitals and nursing note reviewed.  Constitutional:      General: He is not in acute distress.    Appearance: He is well-developed. He is not diaphoretic.  HENT:     Head: Normocephalic and atraumatic.  Cardiovascular:     Rate and Rhythm: Normal rate and regular rhythm.     Heart sounds: Normal heart sounds. No murmur heard.   Pulmonary:     Effort: Pulmonary effort is normal.     Breath sounds: Normal breath sounds.  Chest:     Chest wall: No tenderness.  Abdominal:     Palpations: Abdomen is soft.     Tenderness: There is no abdominal  tenderness.  Musculoskeletal:     Right lower leg: No tenderness. No edema.     Left lower leg: No tenderness. No edema.  Skin:    General: Skin is warm and dry.     Findings: No erythema or rash.  Neurological:     Mental Status: He is alert and oriented to person, place, and time.  Psychiatric:        Behavior: Behavior normal.     ED Results / Procedures / Treatments   Labs (all labs ordered are listed, but only abnormal results are displayed) Labs Reviewed  BASIC METABOLIC PANEL - Abnormal; Notable for the following components:      Result Value   Glucose, Bld 136 (*)    Creatinine, Ser 1.34 (*)    GFR, Estimated 54 (*)    All other components within normal limits  CBC - Abnormal; Notable for the following components:   RBC 3.86 (*)    Hemoglobin 10.8 (*)    HCT 34.2 (*)    All other components within normal limits  RESP PANEL BY RT-PCR (FLU A&B, COVID) [id number]  TROPONIN I (HIGH SENSITIVITY)  TROPONIN I (HIGH SENSITIVITY)    EKG EKG Interpretation  Date/Time:  Tuesday Oct 25 2020 16:51:21 EDT Ventricular Rate:  73 PR Interval:  168 QRS Duration: 76 QT Interval:  386 QTC Calculation: 425 R Axis:   41 Text Interpretation: Normal sinus rhythm Possible Left atrial enlargement Borderline ECG Confirmed by Noemi Chapel [phone]) on 10/25/2020 5:15:24 PM   Radiology DG Chest Portable 1 View  Result Date: 10/25/2020 CLINICAL DATA:  Left-sided chest pain and shortness of breath. EXAM: PORTABLE CHEST 1 VIEW COMPARISON:  10/02/2019 FINDINGS: The cardiomediastinal contours are normal. The lungs are clear. Again seen calcified granuloma at the left lung base, benign. Pulmonary vasculature is normal. No consolidation, pleural effusion, or pneumothorax. No acute osseous abnormalities are seen. IMPRESSION: No acute chest findings. Electronically Signed   By: Keith Rake M.D.   On: 10/25/2020 18:04    Procedures Procedures   Medications Ordered in ED Medications - No data  to display  ED Course  I have reviewed the triage vital signs and the nursing  notes.  Pertinent labs & imaging results that were available during my care of the patient were reviewed by me and considered in my medical decision making (see chart for details).  Clinical Course as of 10/25/20 2038  Tue Oct 25, 2860  4311 80 year old male with history as above presents with complaint of left-sided chest tightness onset just prior to arrival in the emergency room. Exam is unremarkable, no lower extremity edema. Initial troponin is 3, repeat troponin 3.  EKG without acute ischemic changes.  [LM]  1956 Hemoglobin 10.8, previously 11.51-year ago.  BMP with creatinine of 1.34 no significant change from prior. Hear score is 5.  Discussed with Dr. Sabra Heck, ER attending.  Reviewed results with patient and family, continues to have slight left-sided chest discomfort rated currently 2 out of 10.  Patient is agreeable to stay for observation.  Hospitalist paged for consult. [LM]  2019 Case discussed with Dr. Josephine Cables, [facility] hospitalist service who will consult for admission. [LM]    Clinical Course User Index [LM] Roque Lias   MDM Rules/Calculators/A&P HEAR Score: 5                        Final Clinical Impression(s) / ED Diagnoses Final diagnoses:  Chest pain due to myocardial ischemia, unspecified ischemic chest pain type    Rx / DC Orders ED Discharge Orders    None       Roque Lias 10/25/20 2039    Noemi Chapel, MD 10/26/20 Tresa Moore

## 2020-10-26 ENCOUNTER — Observation Stay (HOSPITAL_BASED_OUTPATIENT_CLINIC_OR_DEPARTMENT_OTHER): Payer: Medicare HMO

## 2020-10-26 DIAGNOSIS — I25118 Atherosclerotic heart disease of native coronary artery with other forms of angina pectoris: Secondary | ICD-10-CM

## 2020-10-26 DIAGNOSIS — N400 Enlarged prostate without lower urinary tract symptoms: Secondary | ICD-10-CM | POA: Diagnosis not present

## 2020-10-26 DIAGNOSIS — R0789 Other chest pain: Secondary | ICD-10-CM | POA: Diagnosis not present

## 2020-10-26 DIAGNOSIS — R079 Chest pain, unspecified: Secondary | ICD-10-CM | POA: Diagnosis not present

## 2020-10-26 DIAGNOSIS — E785 Hyperlipidemia, unspecified: Secondary | ICD-10-CM | POA: Diagnosis not present

## 2020-10-26 DIAGNOSIS — K219 Gastro-esophageal reflux disease without esophagitis: Secondary | ICD-10-CM

## 2020-10-26 DIAGNOSIS — E119 Type 2 diabetes mellitus without complications: Secondary | ICD-10-CM | POA: Diagnosis not present

## 2020-10-26 LAB — COMPREHENSIVE METABOLIC PANEL
ALT: 23 U/L (ref 0–44)
AST: 19 U/L (ref 15–41)
Albumin: 3.6 g/dL (ref 3.5–5.0)
Alkaline Phosphatase: 89 U/L (ref 38–126)
Anion gap: 6 (ref 5–15)
BUN: 21 mg/dL (ref 8–23)
CO2: 30 mmol/L (ref 22–32)
Calcium: 9.3 mg/dL (ref 8.9–10.3)
Chloride: 103 mmol/L (ref 98–111)
Creatinine, Ser: 1.07 mg/dL (ref 0.61–1.24)
GFR, Estimated: >60 mL/min (ref >60)
Glucose, Bld: 83 mg/dL (ref 70–99)
Potassium: 4.2 mmol/L (ref 3.5–5.1)
Sodium: 139 mmol/L (ref 135–145)
Total Bilirubin: 1.3 mg/dL — ABNORMAL HIGH (ref 0.3–1.2)
Total Protein: 6.8 g/dL (ref 6.5–8.1)

## 2020-10-26 LAB — PHOSPHORUS: Phosphorus: 3.4 mg/dL (ref 2.5–4.6)

## 2020-10-26 LAB — CBC
HCT: 34.2 % — ABNORMAL LOW (ref 39.0–52.0)
Hemoglobin: 11.3 g/dL — ABNORMAL LOW (ref 13.0–17.0)
MCH: 28.9 pg (ref 26.0–34.0)
MCHC: 33 g/dL (ref 30.0–36.0)
MCV: 87.5 fL (ref 80.0–100.0)
Platelets: 268 10*3/uL (ref 150–400)
RBC: 3.91 MIL/uL — ABNORMAL LOW (ref 4.22–5.81)
RDW: 12.8 % (ref 11.5–15.5)
WBC: 5.1 10*3/uL (ref 4.0–10.5)
nRBC: 0 % (ref 0.0–0.2)

## 2020-10-26 LAB — APTT: aPTT: 41 seconds — ABNORMAL HIGH (ref 24–36)

## 2020-10-26 LAB — ECHOCARDIOGRAM COMPLETE
Area-P 1/2: 2.19 cm2
Height: 68 in
S' Lateral: 2.13 cm
Weight: 2272 oz

## 2020-10-26 LAB — PROTIME-INR
INR: 1.1 (ref 0.8–1.2)
Prothrombin Time: 14.3 seconds (ref 11.4–15.2)

## 2020-10-26 LAB — MAGNESIUM: Magnesium: 2 mg/dL (ref 1.7–2.4)

## 2020-10-26 MED ORDER — PANTOPRAZOLE SODIUM 40 MG PO TBEC
40.0000 mg | DELAYED_RELEASE_TABLET | Freq: Two times a day (BID) | ORAL | Status: DC
  Administered 2020-10-26: 40 mg via ORAL
  Filled 2020-10-26: qty 1

## 2020-10-26 MED ORDER — ASPIRIN 81 MG PO TBEC: 81.0000 mg | DELAYED_RELEASE_TABLET | Freq: Every day | ORAL | 11 refills | Status: DC

## 2020-10-26 MED ORDER — ISOSORBIDE MONONITRATE ER 30 MG PO TB24
15.0000 mg | ORAL_TABLET | Freq: Every day | ORAL | Status: DC
  Administered 2020-10-26: 15 mg via ORAL
  Filled 2020-10-26: qty 1

## 2020-10-26 MED ORDER — PANTOPRAZOLE SODIUM 40 MG PO TBEC: 40.0000 mg | DELAYED_RELEASE_TABLET | Freq: Two times a day (BID) | ORAL | 2 refills | Status: DC

## 2020-10-26 MED ORDER — ISOSORBIDE MONONITRATE ER 30 MG PO TB24: 15.0000 mg | ORAL_TABLET | Freq: Every day | ORAL | 1 refills | Status: DC

## 2020-10-26 NOTE — Discharge Summary (Signed)
Physician Discharge Summary  Angel Dixon GYJ:[id number] DOB: 1941-03-22 DOA: 10/25/2020  PCP: Lemmie Evens, MD  Admit date: 10/25/2020 Discharge date: 10/26/2020  Time spent: 35 minutes  Recommendations for Outpatient Follow-up:  1. Repeat basic metabolic panel to follow lites and renal function 2. Reassess blood pressure and adjust antihypertensive regimen as needed 3. Outpatient follow-up with cardiology service to further adjust antianginal management and decide the need for cardiac catheterization.   Discharge Diagnoses:  Principal Problem:   Atypical chest pain Active Problems:   CAD (coronary artery disease)   Diabetes mellitus without complication (HCC)   Essential hypertension   Dyslipidemia   Hyperglycemia due to diabetes mellitus (HCC)   GERD (gastroesophageal reflux disease)   BPH (benign prostatic hyperplasia)   Discharge Condition: Stable and improved.  Discharged home with instruction to follow-up with PCP and cardiology as an outpatient.  CODE STATUS: Full code.  Diet recommendation: Heart healthy modified carbohydrate diet.  Filed Weights   10/25/20 1650  Weight: 64.4 kg    History of present illness:  As per H&P written by Dr. Josephine Cables on 10/25/2020 Angel Dixon is a 80 y.o. male with medical history significant for T2DM, CAD, hypertension, hyperlipidemia, BPH who presents to the emergency department due to left-sided pressure-like chest pain which started around 4:30 PM today.  Patient states that he was helping his son to hold a door while he was fixing it when the chest pain started, it was associated with some dizziness and shortness of breath, he took 3 sublingual nitroglycerin at the same time and was brought to the emergency department by his son.  Chest pain was nonradiating and was nonreproducible, it was rated as 8/10 on pain scale.  Patient denies diaphoresis, nausea, vomiting or abdominal pain.  Chest pain has since decreased to 1/10 on  pain scale.  ED Course:  In the emergency department, BP was 92/68.  Work-up in the ED showed normocytic anemia, BMP showed hyperglycemia, BUN/Cr 22/1.34 (baseline creatinine at 1.0-1.2).  HS Troponin x2 was flat at 3.   Chest x-ray showed no acute chest findings. Hospitalist was asked to admit patient for further evaluation and management.  Hospital Course:  1-atypical chest pain -Troponin negative x2 -No acute ischemic changes on telemetry or EKG -2D echo reassuring with preserved ejection fraction and no wall motion abnormality -Given patient history cardiology was consulted and recommendations given to start patient on Imdur along with metoprolol to further assess with potential angina symptoms -Patient had a recent cardiac stress test which was low risk. -Advised to take aspirin on daily basis and to continue following heart healthy diet. -Outpatient follow-up with cardiology service will be arranged. -Continue the use of as needed nitroglycerin  2-essential hypertension -Stable and well-controlled -Continue the use of Lopressor and low-dose Imdur currently -Heart healthy diet encouraged.  3-hyperglycemia secondary to type 2 diabetes -Resume home hypoglycemic agents (glipizide and metformin) -Patient advised to maintain adequate hydration and to follow modified carbohydrate diet.  4-gastroesophageal reflux disease -Continue the use of PPI; dose adjusted to twice a day for better symptom management.  5-hyperlipidemia -Continue statins  6-history of BPH -Continue alfuzosin -Denies urinary retention symptoms currently.   Procedures:  See below for x-ray report  2D echo: 1. Left ventricular ejection fraction, by estimation, is 65 to 70%. The  left ventricle has normal function. The left ventricle has no regional  wall motion abnormalities. There is mild left ventricular hypertrophy.  Left ventricular diastolic parameters  are indeterminate.  2.  Right ventricular  systolic function is normal. The right ventricular  size is normal. Tricuspid regurgitation signal is inadequate for assessing  PA pressure.  3. The mitral valve is grossly normal. Trivial mitral valve  regurgitation.  4. The aortic valve is tricuspid. There is mild calcification of the  aortic valve. Aortic valve regurgitation is not visualized. Mild aortic  valve sclerosis is present, with no evidence of aortic valve stenosis.  5. The inferior vena cava is normal in size with greater than 50%  respiratory variability, suggesting right atrial pressure of 3 mmHg.   Consultations:  Cardiology service  Discharge Exam: Vitals:   10/26/20 0604 10/26/20 1309  BP: 129/79 102/70  Pulse: 61 67  Resp: 18 18  Temp: 97.7 F (36.5 C) (!) 97.5 F (36.4 C)  SpO2: 100% 100%    General: Afebrile, no chest pain, no nausea or vomiting currently.  Denies palpitations and feels ready to go home. Cardiovascular: Rate controlled, no rubs, no gallops, no JVD. Respiratory: Clear to auscultation bilaterally; no using accessory muscles.  Good saturation on room air. Abdomen: Soft, nontender, positive bowel sounds Extremities: No cyanosis or clubbing.  Discharge Instructions   Discharge Instructions    Diet - low sodium heart healthy   Complete by: As directed    Discharge instructions   Complete by: As directed    Take medications as prescribed Maintain adequate hydration Follow heart healthy diet Arrange follow-up with PCP in 10 days Follow-up with cardiology service as instructed.     Allergies as of 10/26/2020      Reactions   Lisinopril Other (See Comments)   Sore throat, no obvious angioedema or cough   Sulfa Antibiotics Hives   Sulfacetamide Hives   Vioxx [rofecoxib] Hives   Yellow Jacket Venom [bee Venom] Swelling      Medication List    STOP taking these medications   ibuprofen 800 MG tablet Commonly known as: ADVIL     TAKE these medications   alfuzosin 10 MG 24 hr  tablet Commonly known as: UROXATRAL Take 10 mg by mouth daily.   aspirin 81 MG EC tablet Take 1 tablet (81 mg total) by mouth daily. Swallow whole. Start taking on: Oct 27, 2020   atorvastatin 40 MG tablet Commonly known as: LIPITOR Take 40 mg by mouth daily.   BLINK TEARS OP Place 1 drop into both eyes 2 (two) times daily.   fluticasone 50 MCG/ACT nasal spray Commonly known as: FLONASE Place 1 spray into both nostrils as needed for allergies or rhinitis.   glipiZIDE 5 MG tablet Commonly known as: GLUCOTROL Take by mouth 2 (two) times daily.   isosorbide mononitrate 30 MG 24 hr tablet Commonly known as: IMDUR Take 0.5 tablets (15 mg total) by mouth daily. Start taking on: Oct 27, 2020   meclizine 25 MG tablet Commonly known as: ANTIVERT Take 25 mg by mouth as needed for dizziness.   metFORMIN 500 MG tablet Commonly known as: GLUCOPHAGE Take 500 mg by mouth 2 (two) times daily.   metoprolol tartrate 25 MG tablet Commonly known as: LOPRESSOR Take 25 mg by mouth 2 (two) times daily.   nitroGLYCERIN 0.4 MG SL tablet Commonly known as: NITROSTAT Place 1 tablet (0.4 mg total) under the tongue every 5 (five) minutes as needed for chest pain (severe chest pain or pressure only).   pantoprazole 40 MG tablet Commonly known as: PROTONIX Take 1 tablet (40 mg total) by mouth 2 (two) times daily. What changed: when to  take this   VITAMIN C PO Take 1 tablet by mouth daily.      Allergies  Allergen Reactions  . Lisinopril Other (See Comments)    Sore throat, no obvious angioedema or cough  . Sulfa Antibiotics Hives  . Sulfacetamide Hives  . Vioxx [Rofecoxib] Hives  . Yellow Jacket Venom [Bee Venom] Swelling    Follow-up Information    Deberah Pelton, NP Follow up on 11/18/2020.   Specialty: Cardiology Why: Cardiology Hospital Follow-up on 11/18/2020 at 11:15 AM with Coletta Memos, NP (works with Dr. Debara Pickett) Contact information: [address]  [place] [address] [phone]        Lemmie Evens, MD. Schedule an appointment as soon as possible for a visit in 74 day(s).   Specialty: Family Medicine Contact information: [place] [address] [phone]        Pixie Casino, MD .   Specialty: Cardiology Contact information: [address] [place] [address] [phone]               The results of significant diagnostics from this hospitalization (including imaging, microbiology, ancillary and laboratory) are listed below for reference.    Significant Diagnostic Studies: DG Chest Portable 1 View  Result Date: 10/25/2020 CLINICAL DATA:  Left-sided chest pain and shortness of breath. EXAM: PORTABLE CHEST 1 VIEW COMPARISON:  10/02/2019 FINDINGS: The cardiomediastinal contours are normal. The lungs are clear. Again seen calcified granuloma at the left lung base, benign. Pulmonary vasculature is normal. No consolidation, pleural effusion, or pneumothorax. No acute osseous abnormalities are seen. IMPRESSION: No acute chest findings. Electronically Signed   By: Keith Rake M.D.   On: 10/25/2020 18:04   ECHOCARDIOGRAM COMPLETE  Result Date: 10/26/2020    ECHOCARDIOGRAM REPORT   Patient Name:   Angel Dixon Date of Exam: 10/26/2020 Medical Rec #:  [id number]          Height:       68.0 in Accession #:    [id number]         Weight:       142.0 lb Date of Birth:  [birth year]           BSA:          1.767 m Patient Age:    49 years           BP:           129/79 mmHg Patient Gender: M                  HR:           61 bpm. Exam Location:  [facility] Procedure: 2D Echo Indications:    Chest Pain R07.9  History:        Patient has prior history of Echocardiogram examinations, most                 recent 07/07/2016. CAD, Signs/Symptoms:Chest Pain; Risk                 Factors:Diabetes, Dyslipidemia, Former Smoker and Hypertension.                 GERD, NSVT, Acute Coronary  Syndrome.  Sonographer:    Leavy Cella RDCS (AE) Referring Phys: [place]  1. Left ventricular ejection fraction, by estimation, is 65 to 70%. The left ventricle has normal function. The left ventricle has no regional wall motion abnormalities. There is mild  left ventricular hypertrophy. Left ventricular diastolic parameters are indeterminate.  2. Right ventricular systolic function is normal. The right ventricular size is normal. Tricuspid regurgitation signal is inadequate for assessing PA pressure.  3. The mitral valve is grossly normal. Trivial mitral valve regurgitation.  4. The aortic valve is tricuspid. There is mild calcification of the aortic valve. Aortic valve regurgitation is not visualized. Mild aortic valve sclerosis is present, with no evidence of aortic valve stenosis.  5. The inferior vena cava is normal in size with greater than 50% respiratory variability, suggesting right atrial pressure of 3 mmHg. FINDINGS  Left Ventricle: Left ventricular ejection fraction, by estimation, is 65 to 70%. The left ventricle has normal function. The left ventricle has no regional wall motion abnormalities. The left ventricular internal cavity size was normal in size. There is  mild left ventricular hypertrophy. Left ventricular diastolic parameters are indeterminate. Right Ventricle: The right ventricular size is normal. No increase in right ventricular wall thickness. Right ventricular systolic function is normal. Tricuspid regurgitation signal is inadequate for assessing PA pressure. Left Atrium: Left atrial size was normal in size. Right Atrium: Right atrial size was normal in size. Pericardium: There is no evidence of pericardial effusion. Mitral Valve: The mitral valve is grossly normal. There is mild calcification of the mitral valve leaflet(s). Trivial mitral valve regurgitation. Tricuspid Valve: The tricuspid valve is grossly normal. Tricuspid valve regurgitation is trivial. Aortic  Valve: The aortic valve is tricuspid. There is mild calcification of the aortic valve. There is mild aortic valve annular calcification. Aortic valve regurgitation is not visualized. Mild aortic valve sclerosis is present, with no evidence of aortic valve stenosis. Pulmonic Valve: The pulmonic valve was grossly normal. Pulmonic valve regurgitation is trivial. Aorta: The aortic root is normal in size and structure. Venous: The inferior vena cava is normal in size with greater than 50% respiratory variability, suggesting right atrial pressure of 3 mmHg. IAS/Shunts: No atrial level shunt detected by color flow Doppler.  LEFT VENTRICLE PLAX 2D LVIDd:         3.53 cm  Diastology LVIDs:         2.13 cm  LV e' medial:    5.35 cm/s LV PW:         1.15 cm  LV E/e' medial:  11.4 LV IVS:        1.20 cm  LV e' lateral:   6.73 cm/s LVOT diam:     2.00 cm  LV E/e' lateral: 9.1 LVOT Area:     3.14 cm  RIGHT VENTRICLE RV S prime:     15.80 cm/s TAPSE (M-mode): 2.4 cm LEFT ATRIUM             Index       RIGHT ATRIUM           Index LA diam:        2.80 cm 1.58 cm/m  RA Area:     11.30 cm LA Vol (A2C):   45.8 ml 25.92 ml/m RA Volume:   25.90 ml  14.66 ml/m LA Vol (A4C):   15.8 ml 8.94 ml/m LA Biplane Vol: 29.8 ml 16.87 ml/m   AORTA Ao Root diam: 3.00 cm MITRAL VALVE MV Area (PHT): 2.19 cm    SHUNTS MV Decel Time: 346 msec    Systemic Diam: 2.00 cm MV E velocity: 61.10 cm/s MV A velocity: 76.20 cm/s MV E/A ratio:  0.80 Rozann Lesches MD Electronically signed by Rozann Lesches MD Signature Date/Time: 10/26/2020/12:39:38  PM    Final     Microbiology: Recent Results (from the past 240 hour(s))  Resp Panel by RT-PCR (Flu A&B, Covid) Nasopharyngeal Swab     Status: None   Collection Time: 10/25/20  7:51 PM   Specimen: Nasopharyngeal Swab; Nasopharyngeal(NP) swabs in vial transport medium  Result Value Ref Range Status   SARS Coronavirus 2 by RT PCR NEGATIVE NEGATIVE Final    Comment: (NOTE) SARS-CoV-2 target nucleic  acids are NOT DETECTED.  The SARS-CoV-2 RNA is generally detectable in upper respiratory specimens during the acute phase of infection. The lowest concentration of SARS-CoV-2 viral copies this assay can detect is 138 copies/mL. A negative result does not preclude SARS-Cov-2 infection and should not be used as the sole basis for treatment or other patient management decisions. A negative result may occur with  improper specimen collection/handling, submission of specimen other than nasopharyngeal swab, presence of viral mutation(s) within the areas targeted by this assay, and inadequate number of viral copies(<138 copies/mL). A negative result must be combined with clinical observations, patient history, and epidemiological information. The expected result is Negative.  Fact Sheet for Patients:  EntrepreneurPulse.com.au  Fact Sheet for Healthcare Providers:  IncredibleEmployment.be  This test is no t yet approved or cleared by the [place] FDA and  has been authorized for detection and/or diagnosis of SARS-CoV-2 by FDA under an Emergency Use Authorization (EUA). This EUA will remain  in effect (meaning this test can be used) for the duration of the COVID-19 declaration under Section 564(b)(1) of the Act, 21 U.S.C.section 360bbb-3(b)(1), unless the authorization is terminated  or revoked sooner.       Influenza A by PCR NEGATIVE NEGATIVE Final   Influenza B by PCR NEGATIVE NEGATIVE Final    Comment: (NOTE) The Xpert Xpress SARS-CoV-2/FLU/RSV plus assay is intended as an aid in the diagnosis of influenza from Nasopharyngeal swab specimens and should not be used as a sole basis for treatment. Nasal washings and aspirates are unacceptable for Xpert Xpress SARS-CoV-2/FLU/RSV testing.  Fact Sheet for Patients: EntrepreneurPulse.com.au  Fact Sheet for Healthcare Providers: IncredibleEmployment.be  This  test is not yet approved or cleared by the [place] FDA and has been authorized for detection and/or diagnosis of SARS-CoV-2 by FDA under an Emergency Use Authorization (EUA). This EUA will remain in effect (meaning this test can be used) for the duration of the COVID-19 declaration under Section 564(b)(1) of the Act, 21 U.S.C. section 360bbb-3(b)(1), unless the authorization is terminated or revoked.  Performed at [facility], [address] [place], [place] [address]      Labs: Basic Metabolic Panel: Recent Labs  Lab 10/25/20 1701 10/26/20 0630  NA 139 139  K 4.1 4.2  CL 102 103  CO2 31 30  GLUCOSE 136* 83  BUN 22 21  CREATININE 1.34* 1.07  CALCIUM 9.1 9.3  MG  --  2.0  PHOS  --  3.4   Liver Function Tests: Recent Labs  Lab 10/26/20 0630  AST 19  ALT 23  ALKPHOS 89  BILITOT 1.3*  PROT 6.8  ALBUMIN 3.6   CBC: Recent Labs  Lab 10/25/20 1701 10/26/20 0630  WBC 4.7 5.1  HGB 10.8* 11.3*  HCT 34.2* 34.2*  MCV 88.6 87.5  PLT 257 268   Signed:  Barton Dubois MD.  [facility] 10/26/2020, 2:39 PM

## 2020-10-26 NOTE — Consult Note (Signed)
Cardiology Consultation:   Patient ID: Angel Dixon MRN: [id number]; DOB: Dec 05, 1940  Admit date: 10/25/2020 Date of Consult: 10/26/2020  PCP:  Lemmie Evens, MD   [facility] Providers Cardiologist:  Pixie Casino, MD        Patient Profile:   Angel Dixon is a 80 y.o. 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 Josephine Cables.  History of Present Illness:   Mr. Coven was examined by Dr. Debara Pickett in 08/2020 and reported worsening chest pain with exertion and a nuclear stress test was recommended for further evaluation. This showed a preserved EF greater than 65% and no evidence of ischemia or infarction, overall being a low risk study. He did follow-up after his test and reported that his symptoms had improved and he was using SL NTG sparingly. He also reported having stopped a vitamin supplement as he felt this was possibly causing symptoms as well.  He presented to [facility] ED on 10/25/2020 for evaluation of left-sided chest pain. He reports being in his usual state of health until yesterday when he developed chest tightness along his left pectoral region after helping his son lift a door. He developed associated dizziness at that time. He took 3 SL NTG at the same time and symptoms improved but did not fully resolve, therefore he came to the ED for evaluation. He reports having baseline dyspnea on exertion with walking up inclines but denies any acute changes in this. Does notice his palpitations occasionally but no increase in frequency or severity of his symptoms. No recent syncope, orthopnea, PND or pitting edema.  Labs show WBC 4.7, Hgb 10.8, platelets 257, Na+ 139, K+ 4.1 and creatinine 1.34. Mg 2.0.   Initial and delta troponin values have been negative at 3.  COVID-negative. CXR showing no acute findings. EKG shows NSR, HR 73 with prominent p-waves but no acute ST changes when compared to prior tracings. He has been in sinus rhythm/sinus bradycardia on telemetry with HR in the 50's to 60's. He does have frequent PVC's with occasional episodes of ventricular trigeminy.    He reports overall feeling well this AM and denies any recurrent pain or dizziness.   Past Medical History:  Diagnosis Date  . Angina   . CAD (coronary artery disease)    moderate, by cath  . Diabetes mellitus   . DJD (degenerative joint disease)    lumbar lam 4/05  . Dyslipidemia   . Dysrhythmia    "palpitation", PVCs  . GERD (gastroesophageal reflux disease)   . Hypertension   . PUD (peptic ulcer disease)    endo 03/15/10    Past Surgical History:  Procedure Laterality Date  . BACK SURGERY  2005  . CARDIAC CATHETERIZATION  02/22/2006   mild obs. CAD 60-70% narrowing in small inferior branch of 1st diagonal of LAD, smooth 10% narrowing of mid RCA (Dr. Corky Downs)  . 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 Marella Chimes)  . CARDIAC CATHETERIZATION  05/11/2011   nonobstructive CAD (Dr. Roni Bread)  . COLONOSCOPY N/A 06/13/2016   Procedure: COLONOSCOPY;  Surgeon: Rogene Houston, MD;  Location: [facility];  Service: Endoscopy;  Laterality: N/A;  1200  . HERNIA REPAIR    . [place]   right  . LEFT HEART CATHETERIZATION WITH CORONARY ANGIOGRAM N/A 05/14/2011   Procedure: LEFT HEART CATHETERIZATION WITH CORONARY ANGIOGRAM;  Surgeon: [name], MD;  Location: [facility] CATH LAB;  Service: Cardiovascular;  Laterality: N/A;  Coronary angiogram and possible PCI  . LUMBAR LAMINECTOMY  09/2003   L2-3, 3-4, 4-5  . NM MYOCAR PERF WALL MOTION  10/03/2009   bruce myoview - normal perfusion in all regions, EF 70%, no ischemia, low  risk  . TRANSTHORACIC ECHOCARDIOGRAM  03/28/2012   EF 56-31%, grade 1 diastolic dysfunction, mod AV calcification, mild MR     Home Medications:  Prior to Admission medications   Medication Sig Start Date End Date Taking? Authorizing Provider  alfuzosin (UROXATRAL) 10 MG 24 hr tablet Take 10 mg by mouth daily.  12/11/12  Yes [provider]  Ascorbic Acid (VITAMIN C PO) Take 1 tablet by mouth daily.   Yes [provider]  atorvastatin (LIPITOR) 40 MG tablet Take 40 mg by mouth daily.   Yes [provider]  fluticasone (FLONASE) 50 MCG/ACT nasal spray Place 1 spray into both nostrils as needed for allergies or rhinitis.  09/20/14  Yes [provider]  glipiZIDE (GLUCOTROL) 5 MG tablet Take by mouth 2 (two) times daily.   Yes [provider]  ibuprofen (ADVIL,MOTRIN) 800 MG tablet Take 0.5 tablets (400 mg total) by mouth 3 (three) times daily as needed. Patient taking differently: Take 400 mg by mouth 3 (three) times daily as needed for mild pain. 06/13/16  Yes Rehman, Mechele Dawley, MD  meclizine (ANTIVERT) 25 MG tablet Take 25 mg by mouth as needed for dizziness. 04/14/20  Yes [provider]  metFORMIN (GLUCOPHAGE) 500 MG tablet Take 500 mg by mouth 2 (two) times daily.   Yes [provider]  metoprolol tartrate (LOPRESSOR) 25 MG tablet Take 25 mg by mouth 2 (two) times daily.   Yes [provider]  pantoprazole (PROTONIX) 40 MG tablet Take 40 mg by mouth daily.   Yes [provider]  Polyethylene Glycol 400 (BLINK TEARS OP) Place 1 drop into both eyes 2 (two) times daily.   Yes [provider]  nitroGLYCERIN (NITROSTAT) 0.4 MG SL tablet Place 1 tablet (0.4 mg total) under the tongue every 5 (five) minutes as needed for chest pain (severe chest pain or pressure only). 08/12/20 05/19/29  Pixie Casino, MD    Inpatient Medications: Scheduled Meds: . alfuzosin  10 mg Oral Daily  . aspirin EC  81 mg Oral Daily   . atorvastatin  40 mg Oral Daily  . enoxaparin (LOVENOX) injection  40 mg Subcutaneous Q24H  . isosorbide mononitrate  15 mg Oral Daily  . metoprolol tartrate  25 mg Oral BID  . pantoprazole  40 mg Oral BID   Continuous Infusions:  PRN Meds: nitroGLYCERIN  Allergies:    Allergies  Allergen Reactions  . Lisinopril Other (See Comments)    Sore throat, no obvious angioedema or cough  . Sulfa Antibiotics Hives  . Sulfacetamide Hives  . Vioxx [Rofecoxib] Hives  . [place] Venom [Bee Venom] Swelling    Social History:   Social History   Socioeconomic History  . Marital status: Married    Spouse name: Not on file  . Number of children: 4  . Years of education: 46  . Highest education level: Not on  file  Occupational History  . Occupation: retired  Tobacco Use  . Smoking status: Former Smoker    Packs/day: 1.00    Years: 25.00    Pack years: 25.00    Types: Cigarettes  . Smokeless tobacco: Never Used  . Tobacco comment: quit smoking cigarettes ~ 1990  Substance and Sexual Activity  . Alcohol use: Yes    Comment: 05/11/11 "shot every now and then"  . Drug use: No  . Sexual activity: Yes  Other Topics Concern  . Not on file  Social History Narrative  . Not on file   Social Determinants of Health   Financial Resource Strain: Not on file  Food Insecurity: Not on file  Transportation Needs: Not on file  Physical Activity: Not on file  Stress: Not on file  Social Connections: Not on file  Intimate Partner Violence: Not on file    Family History:    Family History  Problem Relation Age of Onset  . Coronary artery disease Father        died 50  . Kidney failure Mother        died 54  . Sarcoidosis Daughter        died 58  . Heart attack Sister        died of MI in her 63s     ROS:  Please see the history of present illness.   All other ROS reviewed and negative.     Physical Exam/Data:   Vitals:   10/25/20 2202 10/25/20 2208 10/26/20 0203  10/26/20 0604  BP: (!) 155/93  131/79 129/79  Pulse: 64  (!) 55 61  Resp: 18  18 18   Temp: 97.8 F (36.6 C)  97.8 F (36.6 C) 97.7 F (36.5 C)  TempSrc: Oral  Oral Oral  SpO2: 100% 100% 100% 100%  Weight:      Height:       No intake or output data in the 24 hours ending 10/26/20 1002 Last 3 Weights 10/25/2020 09/15/2020 08/22/2020  Weight (lbs) 142 lb 145 lb 148 lb  Weight (kg) 64.411 kg 65.772 kg 67.132 kg     Body mass index is 21.59 kg/m.  General:  Well nourished, well developed male appearing in no acute distress. HEENT: normal Lymph: no adenopathy Neck: no JVD Endocrine:  No thryomegaly Vascular: No carotid bruits; FA pulses 2+ bilaterally without bruits  Cardiac:  normal S1, S2; RRR with occasional ectopic beats; no murmur.  Lungs:  clear to auscultation bilaterally, no wheezing, rhonchi or rales  Abd: soft, nontender, no hepatomegaly  Ext: no edema Musculoskeletal:  No deformities, BUE and BLE strength normal and equal Skin: warm and dry  Neuro:  CNs 2-12 intact, no focal abnormalities noted Psych:  Normal affect   EKG:  The EKG was personally reviewed and demonstrates: NSR, HR 73 with prominent p-waves but no acute ST changes when compared to prior tracings.  Telemetry:  Telemetry was personally reviewed and demonstrates: Sinus rhythm/sinus bradycardia on telemetry with HR in the 50's to 60's. He does have frequent PVC's with occasional episodes of ventricular trigeminy.     Relevant CV Studies:  Cardiac Catheterization: 05/2011 Left Ventriculography:             EF:  55-60%             Wall Motion: No significant wall motion abnormality; 1+ MR  Coronary Angiographic Data: Right dominant  Left Main:  Large caliber, bifurcates to the LAD and Circumflex,  angiographically normal  Left Anterior Descending (LAD):  Moderate caliber, tapers the apex no significant disease  Several small to moderate diagonal branches small-caliber no significant disease  the  Circumflex (LCx):  Moderate to large caliber, 40-50% mid vessel stenosis, no other significant disease  1st obtuse marginal:  Moderate caliber, Proximal ~60% in a <2.0 mm vessel  2nd obtuse marginal:  Small - moderate, minimal luminal iregularities 3rd obtuse marginal:  Small - moderate, minimal luminal iregularities   posterior lateral branch:  Small - moderate, minimal luminal iregularities  Right Coronary Artery: Large Caliber, minimal luminal irregularities  right ventricle branch of right coronary artery: very small, luminal irregularities  posterior descending artery: prox ~40%  posterior lateral branch:  3 branches - small, minimal luminal irregularities.  Impression: 1.  Nonobstructive coronary disease 2.  Preserved left ventricular ejection fraction     NST: 08/2020  The left ventricular ejection fraction is hyperdynamic (>65%).  Nuclear stress EF: 70%.  There was no ST segment deviation noted during stress.  The study is normal.   No evidence for ischemia or infarction, normal EF.    Laboratory Data:  High Sensitivity Troponin:   Recent Labs  Lab 10/25/20 1701 10/25/20 1853  TROPONINIHS 3 3     Chemistry Recent Labs  Lab 10/25/20 1701 10/26/20 0630  NA 139 139  K 4.1 4.2  CL 102 103  CO2 31 30  GLUCOSE 136* 83  BUN 22 21  CREATININE 1.34* 1.07  CALCIUM 9.1 9.3  GFRNONAA 54* >60  ANIONGAP 6 6    Recent Labs  Lab 10/26/20 0630  PROT 6.8  ALBUMIN 3.6  AST 19  ALT 23  ALKPHOS 89  BILITOT 1.3*   Hematology Recent Labs  Lab 10/25/20 1701 10/26/20 0630  WBC 4.7 5.1  RBC 3.86* 3.91*  HGB 10.8* 11.3*  HCT 34.2* 34.2*  MCV 88.6 87.5  MCH 28.0 28.9  MCHC 31.6 33.0  RDW 12.6 12.8  PLT 257 268   BNPNo results for input(s): BNP, PROBNP in the last 168 hours.  DDimer No results for input(s): DDIMER in the last 168 hours.   Radiology/Studies:  DG Chest Portable 1 View  Result Date: 10/25/2020 CLINICAL DATA:  Left-sided  chest pain and shortness of breath. EXAM: PORTABLE CHEST 1 VIEW COMPARISON:  10/02/2019 FINDINGS: The cardiomediastinal contours are normal. The lungs are clear. Again seen calcified granuloma at the left lung base, benign. Pulmonary vasculature is normal. No consolidation, pleural effusion, or pneumothorax. No acute osseous abnormalities are seen. IMPRESSION: No acute chest findings. Electronically Signed   By: Keith Rake M.D.   On: 10/25/2020 18:04     Assessment and Plan:   1. Chest Pain with Mixed Features/CAD - The patient has known nonobstructive CAD by prior catheterization in 2012 as outlined above and recently underwent stress testing in 08/2020 which was low risk and showed no evidence of ischemia or infarction. - He has ruled out for ACS and his EKG is without acute ST changes. Agree with obtaining a repeat echocardiogram for reassessment of LV function and wall motion given his frequent PVC's and presenting symptoms. Would not repeat a stress test at this time given recent evaluation less than 2 months ago. - Will review with MD but anticipate further titration of medical therapy including dose titration of Lopressor if HR allows and the addition of Imdur. If EF is reduced by echo or he has recurrent pain this admission, would plan for a cardiac catheterization for definitive  evaluation - Continue ASA 81 mg daily, Lipitor 40 mg daily and Lopressor 25 mg twice daily.  Will add Imdur 15 mg daily.  2.  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 Would consider a Holter monitor as an outpatient to assess his PVC burden. - K+ at 4.2 this AM and Mg at 2.0. Continue BB therapy.   3. HLD - Followed by his PCP as an outpatient with goal LDL less than 70 given known CAD. He remains on Atorvastatin 40mg  daily.   4. HTN - BP was initially soft but has improved, at 129/79 on most recent check. Continue Lopressor and will add  Imdur as outlined above.    For questions or updates, please contact [place] Please consult [email or web address] for contact info under    Signed, Erma Heritage, PA-C  10/26/2020 10:02 AM

## 2020-10-26 NOTE — Progress Notes (Signed)
*  PRELIMINARY RESULTS* Echocardiogram 2D Echocardiogram has been performed.  Leavy Cella 10/26/2020, 10:41 AM

## 2020-10-26 NOTE — Plan of Care (Signed)

## 2020-11-16 NOTE — Progress Notes (Signed)
Cardiology Clinic Note   Patient Name: Angel Dixon Date of Encounter: 11/18/2020  Primary Care Provider:  Lemmie Evens, MD Primary Cardiologist:  Pixie Casino, MD  Patient Profile    Angel Dixon 80 year old male presents to the clinic today for follow-up evaluation of his recent chest pain.  Past Medical History    Past Medical History:  Diagnosis Date   Angina    CAD (coronary artery disease)    moderate, by cath   Diabetes mellitus    DJD (degenerative joint disease)    lumbar lam 4/05   Dyslipidemia    Dysrhythmia    "palpitation", PVCs   GERD (gastroesophageal reflux disease)    Hypertension    PUD (peptic ulcer disease)    endo 03/15/10   Past Surgical History:  Procedure Laterality Date   BACK SURGERY  2005   CARDIAC CATHETERIZATION  02/22/2006   mild obs. CAD 60-70% narrowing in small inferior branch of 1st diagonal of LAD, smooth 10% narrowing of mid RCA (Dr. 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 Marella Chimes)   CARDIAC CATHETERIZATION  05/11/2011   nonobstructive CAD (Dr. Roni Bread)   COLONOSCOPY N/A 06/13/2016   Procedure: COLONOSCOPY;  Surgeon: Rogene Houston, MD;  Location: [facility];  Service: Endoscopy;  Laterality: N/A;  [place]   right   LEFT HEART CATHETERIZATION WITH CORONARY ANGIOGRAM N/A 05/14/2011   Procedure: LEFT HEART CATHETERIZATION WITH CORONARY ANGIOGRAM;  Surgeon: [name], MD;  Location: [facility] C[facility] LAB;  Service: Cardiovascular;  Laterality: N/A;  Coronary angiogram and possible PCI   LUMBAR LAMINECTOMY  09/2003   L2-3, 3-4, 4-5   NM MYOCAR PERF WALL MOTION  10/03/2009   bruce myoview - normal perfusion in all regions, EF 70%, no ischemia, low risk   TRANSTHORACIC ECHOCARDIOGRAM  03/28/2012   EF 48-54%, grade 1 diastolic dysfunction, mod AV calcification, mild MR     Allergies  Allergies  Allergen Reactions   Lisinopril Other (See Comments)    Sore throat, no obvious angioedema or cough   Sulfa Antibiotics Hives   Sulfacetamide Hives   Vioxx [Rofecoxib] Hives   Yellow Jacket Venom [Bee Venom] Swelling    History of Present Illness    Angel Dixon has a PMH of coronary artery disease (cardiac catheterization 12/12 showed 40-50% mid circumflex, 60% OM1, 40% PDA), palpitations, and chest tightness.  After his cardiac catheterization he was placed on beta blocking therapy and his symptoms improved.  He has had subsequent episodes of substernal chest pressure.  He was previously seen by Dr. Rollene Fare who had recommended Ranexa.  This also showed improvement in his symptoms.  He later on felt like he was experiencing symptoms/side effects from his beta blocking medication and it was reduced to 12.5 mg at night.  He was staying physically active and planned on resuming exercise at the [facility].  He was seen by Dr. Debara Pickett on 09/15/2020.  During that time he reported chest discomfort and palpitations.  He had previously undergone nuclear stress test which was negative for ischemia and showed normal LV function.  He reported he is change his diet and stop the supplement he was taking which she felt may be causing some symptoms.  He reported significantly reducing as nitroglycerin use and was only using it sparingly.  He reported  that his symptoms have decreased.  He presents to the emergency department 10/25/2020 and was discharged on 10/26/2020.  He reported left-sided chest pressure and pain that started around 4:30 PM on that day.  He noticed some dizziness and shortness of breath.  He proceeded to take 3 sublingual nitroglycerin at the same time and had his son transport him to the emergency department.  His chest pain was nonradiating and was nonreproducible.  He rated his chest discomfort at 8 out of 10.  He denied diaphoresis, nausea, vomiting, and abdominal  pain.  His chest pain later decreased to 1 out of 10.  His troponins were flat x2.  His chest x-ray showed no acute abnormalities.  His blood pressure was 92/68.  His blood work showed normocytic anemia, BMP showed hyperglycemia.  He had no acute EKG changes.  His echocardiogram showed preserved EF and no wall abnormalities.  Cardiology was consulted and recommended starting Imdur and metoprolol.  He was advised to take aspirin on daily basis, continue his as needed nitroglycerin use, and follow a heart healthy diet.  He presents the clinic today for follow-up evaluation states he continues to have occasional episodes of irregular heartbeat and lightheadedness.  We reviewed his hospital lab work and diagnostics.  He expressed understanding.  He reports that he has scaled back his physical activity but does notice that when he is out doing yard work or being active he has palpitations that are accompanied by lightheadedness.  He says that these are infrequent and somewhat occasional.  He reports that he does try to stay well-hydrated.  He has not had any falls or injuries.  He reports that his activities are limited to light yard work.  His blood pressure today is 146/70.  On review his blood pressures have been well controlled in the 100s-130s over 70s and 80s.  I will give him a blood pressure log, order a 14-day cardiac monitor, salty 6 diet sheet, maintain physical activity, and follow-up in 6 to 8 weeks.  Today denies chest pain, shortness of breath, lower extremity edema, fatigue, palpitations, melena, hematuria, hemoptysis, diaphoresis, weakness, presyncope, syncope, orthopnea, and PND.   Home Medications    Prior to Admission medications   Medication Sig Start Date End Date Taking? Authorizing Provider  alfuzosin (UROXATRAL) 10 MG 24 hr tablet Take 10 mg by mouth daily.  12/11/12   [provider]  Ascorbic Acid (VITAMIN C PO) Take 1 tablet by mouth daily.    [provider]   aspirin EC 81 MG EC tablet Take 1 tablet (81 mg total) by mouth daily. Swallow whole. 10/27/20   Barton Dubois, MD  atorvastatin (LIPITOR) 40 MG tablet Take 40 mg by mouth daily.    [provider]  fluticasone (FLONASE) 50 MCG/ACT nasal spray Place 1 spray into both nostrils as needed for allergies or rhinitis.  09/20/14   [provider]  glipiZIDE (GLUCOTROL) 5 MG tablet Take by mouth 2 (two) times daily.    [provider]  isosorbide mononitrate (IMDUR) 30 MG 24 hr tablet Take 0.5 tablets (15 mg total) by mouth daily. 10/27/20   Barton Dubois, MD  meclizine (ANTIVERT) 25 MG tablet Take 25 mg by mouth as needed for dizziness. 04/14/20   [provider]  metFORMIN (GLUCOPHAGE) 500 MG tablet Take 500 mg by mouth 2 (two) times daily.    [provider]  metoprolol tartrate (LOPRESSOR) 25 MG tablet Take 25 mg by mouth 2 (two) times daily.  [provider]  nitroGLYCERIN (NITROSTAT) 0.4 MG SL tablet Place 1 tablet (0.4 mg total) under the tongue every 5 (five) minutes as needed for chest pain (severe chest pain or pressure only). 08/12/20 05/19/29  Pixie Casino, MD  pantoprazole (PROTONIX) 40 MG tablet Take 1 tablet (40 mg total) by mouth 2 (two) times daily. 10/26/20   Barton Dubois, MD  Polyethylene Glycol 400 (BLINK TEARS OP) Place 1 drop into both eyes 2 (two) times daily.    [provider]    Family History    Family History  Problem Relation Age of Onset   Coronary artery disease Father        died 36   Kidney failure Mother        died 30   Sarcoidosis Daughter        died 40   Heart attack Sister        died of MI in her 3s   He indicated that the status of his mother is unknown. He indicated that the status of his father is unknown. He indicated that the status of his sister is unknown. He indicated that the status of his daughter is unknown.  Social History    Social History   Socioeconomic History    Marital status: Married    Spouse name: Not on file   Number of children: 4   Years of education: 12   Highest education level: Not on file  Occupational History   Occupation: retired  Tobacco Use   Smoking status: Former    Packs/day: 1.00    Years: 25.00    Pack years: 25.00    Types: Cigarettes   Smokeless tobacco: Never   Tobacco comments:    quit smoking cigarettes ~ 1990  Substance and Sexual Activity   Alcohol use: Yes    Comment: 05/11/11 "shot every now and then"   Drug use: No   Sexual activity: Yes  Other Topics Concern   Not on file  Social History Narrative   Not on file   Social Determinants of Health   Financial Resource Strain: Not on file  Food Insecurity: Not on file  Transportation Needs: Not on file  Physical Activity: Not on file  Stress: Not on file  Social Connections: Not on file  Intimate Partner Violence: Not on file     Review of Systems    General:  No chills, fever, night sweats or weight changes.  Cardiovascular:  No chest pain, dyspnea on exertion, edema, orthopnea, palpitations, paroxysmal nocturnal dyspnea. Dermatological: No rash, lesions/masses Respiratory: No cough, dyspnea Urologic: No hematuria, dysuria Abdominal:   No nausea, vomiting, diarrhea, bright red blood per rectum, melena, or hematemesis Neurologic:  No visual changes, wkns, changes in mental status. All other systems reviewed and are otherwise negative except as noted above.  Physical Exam    VS:  BP (!) 146/70 (BP Location: Left Arm, Patient Position: Sitting, Cuff Size: Normal)   Pulse 62   Ht 5\' 8"  (1.727 m)   Wt 148 lb (67.1 kg)   SpO2 100%   BMI 22.50 kg/m  , BMI Body mass index is 22.5 kg/m. GEN: Well nourished, well developed, in no acute distress. HEENT: normal. Neck: Supple, no JVD, carotid bruits, or masses. Cardiac: RRR, no murmurs, rubs, or gallops. No clubbing, cyanosis, edema.  Radials/DP/PT 2+ and equal bilaterally.  Respiratory:  Respirations  regular and unlabored, clear to auscultation bilaterally. GI: Soft, nontender, nondistended, BS + x 4.  MS: no deformity or atrophy. Skin: warm and dry, no rash. Neuro:  Strength and sensation are intact. Psych: Normal affect.  Accessory Clinical Findings    Recent Labs: 10/26/2020: ALT 23; BUN 21; Creatinine, Ser 1.07; Hemoglobin 11.3; Magnesium 2.0; Platelets 268; Potassium 4.2; Sodium 139   Recent Lipid Panel    Component Value Date/Time   CHOL 121 03/28/2012 0249   TRIG 222 (H) 03/28/2012 0249   HDL 30 (L) 03/28/2012 0249   CHOLHDL 4.0 03/28/2012 0249   VLDL 44 (H) 03/28/2012 0249   LDLCALC 47 03/28/2012 0249    ECG personally reviewed by me today-none today.  Nuclear stress test 08/22/2020 The left ventricular ejection fraction is hyperdynamic (>65%). Nuclear stress EF: 70%. There was no ST segment deviation noted during stress. The study is normal.   No evidence for ischemia or infarction, normal EF.  Echocardiogram 10/26/2020 IMPRESSIONS     1. Left ventricular ejection fraction, by estimation, is 65 to 70%. The  left ventricle has normal function. The left ventricle has no regional  wall motion abnormalities. There is mild left ventricular hypertrophy.  Left ventricular diastolic parameters  are indeterminate.   2. Right ventricular systolic function is normal. The right ventricular  size is normal. Tricuspid regurgitation signal is inadequate for assessing  PA pressure.   3. The mitral valve is grossly normal. Trivial mitral valve  regurgitation.   4. The aortic valve is tricuspid. There is mild calcification of the  aortic valve. Aortic valve regurgitation is not visualized. Mild aortic  valve sclerosis is present, with no evidence of aortic valve stenosis.   5. The inferior vena cava is normal in size with greater than 50%  respiratory variability, suggesting right atrial pressure of 3 mmHg.   Assessment & Plan   1.  Palpitations/presyncope-reports  infrequent episodes of palpitations associated with lightheadedness.  Notices these mainly after bouts of physical activity.  Reports that he tries to maintain hydration. Order 14-day cardiac event monitor Maintain p.o. hydration Maintain physical activity  Atypical chest pain-resolved.  Presented to the emergency department on 10/25/2020 and was discharged on 10/26/2020.  Cardiac work-up was negative.  He was started on Imdur and metoprolol. Continue Imdur, metoprolol, pantoprazole, aspirin Heart healthy low-sodium diet-salty 6 given Increase physical activity as tolerated  Coronary artery disease-no chest pain today.  Previously noted to have mild nonobstructive CAD.  Stress test 3/22 showed no ischemia and low risk. Continue aspirin, metoprolol, Imdur, aspirin, atorvastatin, nitroglycerin Heart healthy low-sodium diet-salty 6 given Increase physical activity as tolerated  Hyperlipidemia-LDL 47 on 03/28/2012 Continue aspirin, atorvastatin Heart healthy low-sodium high-fiber diet Increase physical activity as tolerated Repeat fasting lipids and LFTs  Essential hypertension-BP today 146/70.  Well-controlled at home. Continue Imdur, metoprolol Heart healthy low-sodium diet-salty 6 given Increase physical activity as tolerated  GERD-no recent episodes of reflux type discomfort. Continue pantoprazole Heart healthy low-sodium diet-salty 6 given Increase physical activity as tolerated GERD diet information discussed  Disposition: Follow-up with Dr. Debara Pickett in 6 -8 weeks.  Jossie Ng. Sarp Vernier NP-C    11/18/2020, 11:45 AM [place] [address] Office [phone] Fax [phone]  Notice: This dictation was prepared with Dragon dictation along with smaller phrase technology. Any transcriptional errors that result from this process are unintentional and may not be corrected upon review.  I spent 15 minutes examining this patient, reviewing  medications, and using patient centered shared decision making involving her cardiac care.  Prior to her visit I  spent greater than 20 minutes reviewing her past medical history,  medications, and prior cardiac tests.

## 2020-11-18 ENCOUNTER — Encounter: Payer: Self-pay | Admitting: General Practice

## 2020-11-18 ENCOUNTER — Ambulatory Visit: Payer: Medicare HMO | Admitting: General Practice

## 2020-11-18 ENCOUNTER — Other Ambulatory Visit: Payer: Self-pay

## 2020-11-18 ENCOUNTER — Ambulatory Visit: Payer: Medicare HMO

## 2020-11-18 VITALS — BP 146/70 | HR 62 | Ht 68.0 in | Wt 148.0 lb

## 2020-11-18 DIAGNOSIS — R002 Palpitations: Secondary | ICD-10-CM

## 2020-11-18 DIAGNOSIS — I25119 Atherosclerotic heart disease of native coronary artery with unspecified angina pectoris: Secondary | ICD-10-CM | POA: Diagnosis not present

## 2020-11-18 DIAGNOSIS — R55 Syncope and collapse: Secondary | ICD-10-CM

## 2020-11-18 DIAGNOSIS — I10 Essential (primary) hypertension: Secondary | ICD-10-CM | POA: Diagnosis not present

## 2020-11-18 DIAGNOSIS — R0789 Other chest pain: Secondary | ICD-10-CM

## 2020-11-18 DIAGNOSIS — E785 Hyperlipidemia, unspecified: Secondary | ICD-10-CM

## 2020-11-18 DIAGNOSIS — K219 Gastro-esophageal reflux disease without esophagitis: Secondary | ICD-10-CM

## 2020-11-18 NOTE — Progress Notes (Unsigned)
Enrolled patient for a 14 day Zio XT monitor to be mailed to patients home  Dr Hilty to read 

## 2020-11-18 NOTE — Patient Instructions (Signed)
Medication Instructions:  The current medical regimen is effective;  continue present plan and medications as directed. Please refer to the Current Medication list given to you today.  *If you need a refill on your cardiac medications before your next appointment, please call your pharmacy*  Lab Work: NONE If you have labs (blood work) drawn today and your tests are completely normal, you will receive your results only by:  [place] (if you have MyChart) OR A paper copy in the mail.  If you have any lab test that is abnormal or we need to change your treatment, we will call you to review the results. You may go to any [facility] that is convenient for you however, we do have a lab in our office that is able to assist you. You DO NOT need an appointment for our lab. The lab is open 8:00am and closes at 4:00pm. Lunch 12:45 - 1:45pm.  Testing/Procedures: Your physician has requested you wear a ZIO patch monitor for 14 days.   Special Instructions PLEASE INCREASE HYDRATION  PLEASE READ AND FOLLOW SALTY 6-ATTACHED-1,800 mg daily  TAKE AND LOG YOUR BLOOD PRESSURE  Follow-Up: Your next appointment:  6-8 week(s) In Person with K. Mali Hilty, MD OR IF UNAVAILABLE JESSE CLEAVER, FNP-C   At [facility], you and your health needs are our priority.  As part of our continuing mission to provide you with exceptional heart care, we have created designated Provider Care Teams.  These Care Teams include your primary Cardiologist (physician) and Advanced Practice Providers (APPs -  Physician Assistants and Nurse Practitioners) who all work together to provide you with the care you need, when you need it.  We recommend signing up for the patient portal called "MyChart".  Sign up information is provided on this After Visit Summary.  MyChart is used to connect with patients for Virtual Visits (Telemedicine).  Patients are able to view lab/test results, encounter notes, upcoming appointments, etc.  Non-urgent  messages can be sent to your provider as well.   To learn more about what you can do with MyChart, go to [email or web address].            ZIO XT- Long Term Monitor Instructions  Your physician has requested you wear a ZIO patch monitor for 14 days.  This is a single patch monitor. [employer] supplies one patch monitor per enrollment. Additional stickers are not available. Please do not apply patch if you will be having a Nuclear Stress Test,  Echocardiogram, Cardiac CT, MRI, or Chest Xray during the period you would be wearing the  monitor. The patch cannot be worn during these tests. You cannot remove and re-apply the  ZIO XT patch monitor.  Your ZIO patch monitor will be mailed 3 day USPS to your address on file. It may take 3-5 days  to receive your monitor after you have been enrolled.  Once you have received your monitor, please review the enclosed instructions. Your monitor  has already been registered assigning a specific monitor serial # to you.  Billing and Patient Assistance Program Information  We have supplied [employer] with any of your insurance information on file for billing purposes. [employer] offers a sliding scale Patient Assistance Program for patients that do not have  insurance, or whose insurance does not completely cover the cost of the ZIO monitor.  You must apply for the Patient Assistance Program to qualify for this discounted rate.  To apply, please call [employer] at [phone], select option 4, select  option 2, ask to apply for  Patient Assistance Program. Theodore Demark will ask your household income, and how many people  are in your household. They will quote your out-of-pocket cost based on that information.  [employer] will also be able to set up a 74-month, interest-free payment plan if needed.  Applying the monitor   Shave hair from upper left chest.  Hold abrader disc by orange tab. Rub abrader in 40 strokes over the upper left chest as  indicated in your  monitor instructions.  Clean area with 4 enclosed alcohol pads. Let dry.  Apply patch as indicated in monitor instructions. Patch will be placed under collarbone on left  side of chest with arrow pointing upward.  Rub patch adhesive wings for 2 minutes. Remove white label marked "1". Remove the white  label marked "2". Rub patch adhesive wings for 2 additional minutes.  While looking in a mirror, press and release button in center of patch. A small green light will  flash 3-4 times. This will be your only indicator that the monitor has been turned on.  Do not shower for the first 24 hours. You may shower after the first 24 hours.  Press the button if you feel a symptom. You will hear a small click. Record Date, Time and  Symptom in the Patient Logbook.  When you are ready to remove the patch, follow instructions on the last 2 pages of Patient  Logbook. Stick patch monitor onto the last page of Patient Logbook.  Place Patient Logbook in the blue and white box. Use locking tab on box and tape box closed  securely. The blue and white box has prepaid postage on it. Please place it in the mailbox as  soon as possible. Your physician should have your test results approximately 7 days after the  monitor has been mailed back to [facility].  Call [facility] at [phone] if you have questions regarding  your ZIO XT patch monitor. Call them immediately if you see an orange light blinking on your  monitor.  If your monitor falls off in less than 4 days, contact our Monitor department at [phone].  If your monitor becomes loose or falls off after 4 days call [employer] at [phone] for  suggestions on securing your monitor

## 2020-12-12 ENCOUNTER — Other Ambulatory Visit: Payer: Self-pay

## 2020-12-12 ENCOUNTER — Other Ambulatory Visit (INDEPENDENT_AMBULATORY_CARE_PROVIDER_SITE_OTHER): Payer: Medicare HMO

## 2020-12-12 DIAGNOSIS — R55 Syncope and collapse: Secondary | ICD-10-CM

## 2020-12-12 DIAGNOSIS — R002 Palpitations: Secondary | ICD-10-CM

## 2020-12-12 NOTE — Progress Notes (Unsigned)
Monitor mailed to patient 11/18/20.  Applied in office 12/12/20.

## 2020-12-13 ENCOUNTER — Ambulatory Visit: Payer: Medicare HMO | Admitting: Urology

## 2020-12-13 ENCOUNTER — Encounter: Payer: Self-pay | Admitting: Urology

## 2020-12-13 VITALS — BP 141/68 | HR 41 | Temp 98.5°F | Wt 146.6 lb

## 2020-12-13 DIAGNOSIS — N138 Other obstructive and reflux uropathy: Secondary | ICD-10-CM

## 2020-12-13 DIAGNOSIS — R350 Frequency of micturition: Secondary | ICD-10-CM

## 2020-12-13 DIAGNOSIS — N401 Enlarged prostate with lower urinary tract symptoms: Secondary | ICD-10-CM | POA: Diagnosis not present

## 2020-12-13 LAB — URINALYSIS, ROUTINE W REFLEX MICROSCOPIC
Bilirubin, UA: NEGATIVE
Glucose, UA: NEGATIVE
Ketones, UA: NEGATIVE
Leukocytes,UA: NEGATIVE
Nitrite, UA: NEGATIVE
Protein,UA: NEGATIVE
RBC, UA: NEGATIVE
Specific Gravity, UA: 1.01 (ref 1.005–1.030)
Urobilinogen, Ur: 0.2 mg/dL (ref 0.2–1.0)
pH, UA: 5.5 (ref 5.0–7.5)

## 2020-12-13 LAB — BLADDER SCAN AMB NON-IMAGING: Scan Result: 146

## 2020-12-13 MED ORDER — SILODOSIN 8 MG PO CAPS: 8.0000 mg | ORAL_CAPSULE | Freq: Every day | ORAL | 11 refills | Status: DC

## 2020-12-13 MED ORDER — FINASTERIDE 5 MG PO TABS: 5.0000 mg | ORAL_TABLET | Freq: Every day | ORAL | 3 refills | Status: DC

## 2020-12-13 NOTE — Progress Notes (Signed)
Urological Symptom Review  Patient is experiencing the following symptoms: Frequent urination Hard to postpone urination Get up at night to urinate Leakage of urine Stream starts and stops Trouble starting stream Have to strain to urinate   Review of Systems  Gastrointestinal (upper)  : Negative for upper GI symptoms  Gastrointestinal (lower) : Constipation  Constitutional : Weight loss  Skin: Negative for skin symptoms  Eyes: Blurred vision  Ear/Nose/Throat : Sinus problem  Hematologic/Lymphatic: Negative for Hematologic/Lymphatic symptoms  Cardiovascular : Negative for cardiovascular symptoms  Respiratory : Shortness of breath  Endocrine: Negative for endocrine symptoms  Musculoskeletal: Joint pain  Neurological: Dizziness  Psychologic: Negative for psychiatric symptoms

## 2020-12-13 NOTE — Progress Notes (Signed)
post void residual=146

## 2020-12-13 NOTE — Progress Notes (Signed)
12/13/2020 3:49 PM   Angel Dixon 25-Jul-1940 [id number]  Referring provider: Lemmie Evens, MD [address]  [place] [address]  Urinary frequency   HPI: Angel Dixon is a 80yo here for evaluation of urinary frequency. For over 5 years he has noted difficulty urinating. Urinary frequency is every 20-30 minutes. IPSS 15 QOL 3. He has been on uroxatral 10mg  for over 1 year but continues to be unhappy with his urination.  No hx of UTI, no dysuria or hematuria. No other complaints. PVR 150cc.    PMH: Past Medical History:  Diagnosis Date   Angina    CAD (coronary artery disease)    moderate, by cath   Diabetes mellitus    DJD (degenerative joint disease)    lumbar lam 4/05   Dyslipidemia    Dysrhythmia    "palpitation", PVCs   GERD (gastroesophageal reflux disease)    Hypertension    PUD (peptic ulcer disease)    endo 03/15/10    Surgical History: Past Surgical History:  Procedure Laterality Date   BACK SURGERY  2005   CARDIAC CATHETERIZATION  02/22/2006   mild obs. CAD 60-70% narrowing in small inferior branch of 1st diagonal of LAD, smooth 10% narrowing of mid RCA (Dr. 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 Marella Chimes)   CARDIAC CATHETERIZATION  05/11/2011   nonobstructive CAD (Dr. Roni Bread)   COLONOSCOPY N/A 06/13/2016   Procedure: COLONOSCOPY;  Surgeon: Rogene Houston, MD;  Location: [facility];  Service: Endoscopy;  Laterality: N/A;  [place]   right   LEFT HEART CATHETERIZATION WITH CORONARY ANGIOGRAM N/A 05/14/2011   Procedure: LEFT HEART CATHETERIZATION WITH CORONARY ANGIOGRAM;  Surgeon: [name], MD;  Location: [facility] CATH LAB;  Service: Cardiovascular;  Laterality: N/A;  Coronary angiogram and possible PCI   LUMBAR LAMINECTOMY  09/2003   L2-3, 3-4, 4-5   NM MYOCAR PERF WALL MOTION  10/03/2009   bruce  myoview - normal perfusion in all regions, EF 70%, no ischemia, low risk   TRANSTHORACIC ECHOCARDIOGRAM  03/28/2012   EF 87-86%, grade 1 diastolic dysfunction, mod AV calcification, mild Angel    Home Medications:  Allergies as of 12/13/2020       Reactions   Lisinopril Other (See Comments)   Sore throat, no obvious angioedema or cough   Sulfa Antibiotics Hives   Sulfacetamide Hives   Vioxx [rofecoxib] Hives   Yellow Jacket Venom [bee Venom] Swelling        Medication List        Accurate as of December 13, 2020  3:49 PM. If you have any questions, ask your nurse or doctor.          alfuzosin 10 MG 24 hr tablet Commonly known as: UROXATRAL Take 10 mg by mouth daily.   aspirin 81 MG EC tablet Take 1 tablet (81 mg total) by mouth daily. Swallow whole.   atorvastatin 40 MG tablet Commonly known as: LIPITOR Take 40 mg by mouth daily.   BLINK TEARS OP Place 1 drop into both eyes 2 (two) times daily.   fluticasone 50 MCG/ACT nasal spray Commonly known as: FLONASE Place 1 spray into both nostrils as needed for allergies or rhinitis.   glipiZIDE 5 MG tablet Commonly known as: GLUCOTROL Take by mouth 2 (two) times daily.  isosorbide mononitrate 30 MG 24 hr tablet Commonly known as: IMDUR Take 0.5 tablets (15 mg total) by mouth daily.   meclizine 25 MG tablet Commonly known as: ANTIVERT Take 25 mg by mouth as needed for dizziness.   metFORMIN 500 MG tablet Commonly known as: GLUCOPHAGE Take 500 mg by mouth 2 (two) times daily.   metoprolol tartrate 25 MG tablet Commonly known as: LOPRESSOR Take 25 mg by mouth 2 (two) times daily.   nitroGLYCERIN 0.4 MG SL tablet Commonly known as: NITROSTAT Place 1 tablet (0.4 mg total) under the tongue every 5 (five) minutes as needed for chest pain (severe chest pain or pressure only).   pantoprazole 40 MG tablet Commonly known as: PROTONIX Take 1 tablet (40 mg total) by mouth 2 (two) times daily.   VITAMIN C PO Take 1  tablet by mouth daily.        Allergies:  Allergies  Allergen Reactions   Lisinopril Other (See Comments)    Sore throat, no obvious angioedema or cough   Sulfa Antibiotics Hives   Sulfacetamide Hives   Vioxx [Rofecoxib] Hives   Yellow Jacket Venom [Bee Venom] Swelling    Family History: Family History  Problem Relation Age of Onset   Coronary artery disease Father        died 60   Kidney failure Mother        died 53   Sarcoidosis Daughter        died 3   Heart attack Sister        died of MI in her 80s    Social History:  reports that he has quit smoking. His smoking use included cigarettes. He has a 25.00 pack-year smoking history. He has never used smokeless tobacco. He reports current alcohol use. He reports that he does not use drugs.  ROS: All other review of systems were reviewed and are negative except what is noted above in HPI  Physical Exam: BP (!) 141/68   Pulse (!) 41   Temp 98.5 F (36.9 C)   Wt 146 lb 9.6 oz (66.5 kg)   BMI 22.29 kg/m   Constitutional:  Alert and oriented, No acute distress. HEENT: [place] AT, moist mucus membranes.  Trachea midline, no masses. Cardiovascular: No clubbing, cyanosis, or edema. Respiratory: Normal respiratory effort, no increased work of breathing. GI: Abdomen is soft, nontender, nondistended, no abdominal masses GU: No CVA tenderness. Circumcised phallus. No masses/lesions on penis, testis, scrotum. Prostate 60g smooth no nodules no induration.  Lymph: No cervical or inguinal lymphadenopathy. Skin: No rashes, bruises or suspicious lesions. Neurologic: Grossly intact, no focal deficits, moving all 4 extremities. Psychiatric: Normal mood and affect.  Laboratory Data: Lab Results  Component Value Date   WBC 5.1 10/26/2020   HGB 11.3 (L) 10/26/2020   HCT 34.2 (L) 10/26/2020   MCV 87.5 10/26/2020   PLT 268 10/26/2020    Lab Results  Component Value Date   CREATININE 1.07 10/26/2020    No results found for:  PSA  No results found for: TESTOSTERONE  Lab Results  Component Value Date   HGBA1C 7.4 (H) 03/28/2012    Urinalysis    Component Value Date/Time   COLORURINE YELLOW 11/18/2016 [place] 11/18/2016 0152   LABSPEC 1.016 11/18/2016 0152   [place] 5.0 11/18/2016 [place] 11/18/2016 0152   HGBUR NEGATIVE 11/18/2016 [place] NEGATIVE 11/18/2016 [place] 11/18/2016 [place] NEGATIVE 11/18/2016 0152  UROBILINOGEN 0.2 12/20/2014 1314   NITRITE NEGATIVE 11/18/2016 0152   LEUKOCYTESUR NEGATIVE 11/18/2016 0152    No results found for: LABMICR, WBCUA, RBCUA, LABEPIT, MUCUS, BACTERIA  Pertinent Imaging:  Results for orders placed during the hospital encounter of 09/04/03  DG Abd 1 View  Narrative Clinical Data:    Postop from lumbar spine surgery.  Abdominal distention and pain. ABDOMEN, ONE VIEW Diffuse gaseous distention of colon is seen as well as small bowel loops and with gas noted to the level of the rectosigmoid region.  This is consistent with a postoperative ileus.  Midline abdominal skin staples are seen. IMPRESSION Ileus pattern.  Provider: Otis Dials  No results found for this or any previous visit.  No results found for this or any previous visit.  No results found for this or any previous visit.  Results for orders placed during the hospital encounter of 06/19/16  US Renal  Narrative CLINICAL DATA:  BILATERAL flank pain, history hypertension, diabetes mellitus, GERD, coronary artery disease  EXAM: RENAL / URINARY TRACT ULTRASOUND COMPLETE  COMPARISON:  CT abdomen and pelvis 02/20/2007  FINDINGS: Right Kidney:  Length: 11.2 cm. Normal cortical thickness with upper normal cortical echogenicity. Large bilobed central RIGHT renal cyst 6.5 x 3.8 x 3.0 cm, increased in size since previous exam. Slightly irregular septations at the central portion complicate the lesion. No additional  mass, hydronephrosis or shadowing calcification.  Left Kidney:  Length: 11.9 cm. Normal cortical thickness. Upper normal cortical echogenicity. No mass, hydronephrosis, or shadowing calcification  Bladder:  Normal appearance. Enlarged prostate gland 6.8 x 5.2 x 5.9 cm noted.  IMPRESSION: Interval increase in size of a bilobed cyst at the mid RIGHT kidney, demonstrating slightly irregular and thick septation at the central portion ; further characterization of this lesion by Angel imaging with and without contrast recommended to exclude cystic renal neoplasm.  Significant prostatic enlargement.   Electronically Signed By: Lavonia Dana M.D. On: 06/19/2016 15:11  No results found for this or any previous visit.  No results found for this or any previous visit.  No results found for this or any previous visit.   Assessment & Plan:    1. Benign prostatic hyperplasia with urinary obstruction -We will start rapaflo and finasteride. If his LUTS are unimproved in 6-8 weeks we will proceed with cystoscopy  2. Urinary frequency -Rapaflo and finasteride.    No follow-ups on file.  Nicolette Bang, MD  [facility] [place]

## 2020-12-13 NOTE — Patient Instructions (Signed)
Benign Prostatic Hyperplasia  Benign prostatic hyperplasia (BPH) is an enlarged prostate gland that is caused by the normal aging process and not by cancer. The prostate is a walnut-sized gland that is involved in the production of semen. It is located in front of the rectum and below the bladder. The bladder stores urine and the urethra is the tube that carries the urine out of the body. The prostate may get bigger asa man gets older. An enlarged prostate can press on the urethra. This can make it harder to pass urine. The build-up of urine in the bladder can cause infection. Back pressure and infection may progress to bladder damage and kidney (renal) failure. What are the causes? This condition is part of a normal aging process. However, not all men develop problems from this condition. If the prostate enlarges away from the urethra, urine flow will not be blocked. If it enlarges toward the urethra andcompresses it, there will be problems passing urine. What increases the risk? This condition is more likely to develop in men over the age of 50 years. What are the signs or symptoms? Symptoms of this condition include: Getting up often during the night to urinate. Needing to urinate frequently during the day. Difficulty starting urine flow. Decrease in size and strength of your urine stream. Leaking (dribbling) after urinating. Inability to pass urine. This needs immediate treatment. Inability to completely empty your bladder. Pain when you pass urine. This is more common if there is also an infection. Urinary tract infection (UTI). How is this diagnosed? This condition is diagnosed based on your medical history, a physical exam, and your symptoms. Tests will also be done, such as: A post-void bladder scan. This measures any amount of urine that may remain in your bladder after you finish urinating. A digital rectal exam. In a rectal exam, your health care provider checks your prostate by  putting a lubricated, gloved finger into your rectum to feel the back of your prostate gland. This exam detects the size of your gland and any abnormal lumps or growths. An exam of your urine (urinalysis). A prostate specific antigen (PSA) screening. This is a blood test used to screen for prostate cancer. An ultrasound. This test uses sound waves to electronically produce a picture of your prostate gland. Your health care provider may refer you to a specialist in kidney and prostate diseases (urologist). How is this treated? Once symptoms begin, your health care provider will monitor your condition (active surveillance or watchful waiting). Treatment for this condition will depend on the severity of your condition. Treatment may include: Observation and yearly exams. This may be the only treatment needed if your condition and symptoms are mild. Medicines to relieve your symptoms, including: Medicines to shrink the prostate. Medicines to relax the muscle of the prostate. Surgery in severe cases. Surgery may include: Prostatectomy. In this procedure, the prostate tissue is removed completely through an open incision or with a laparoscope or robotics. Transurethral resection of the prostate (TURP). In this procedure, a tool is inserted through the opening at the tip of the penis (urethra). It is used to cut away tissue of the inner core of the prostate. The pieces are removed through the same opening of the penis. This removes the blockage. Transurethral incision (TUIP). In this procedure, small cuts are made in the prostate. This lessens the prostate's pressure on the urethra. Transurethral microwave thermotherapy (TUMT). This procedure uses microwaves to create heat. The heat destroys and removes a small   amount of prostate tissue. Transurethral needle ablation (TUNA). This procedure uses radio frequencies to destroy and remove a small amount of prostate tissue. Interstitial laser coagulation (ILC).  This procedure uses a laser to destroy and remove a small amount of prostate tissue. Transurethral electrovaporization (TUVP). This procedure uses electrodes to destroy and remove a small amount of prostate tissue. Prostatic urethral lift. This procedure inserts an implant to push the lobes of the prostate away from the urethra. Follow these instructions at home: Take over-the-counter and prescription medicines only as told by your health care provider. Monitor your symptoms for any changes. Contact your health care provider with any changes. Avoid drinking large amounts of liquid before going to bed or out in public. Avoid or reduce how much caffeine or alcohol you drink. Give yourself time when you urinate. Keep all follow-up visits as told by your health care provider. This is important. Contact a health care provider if: You have unexplained back pain. Your symptoms do not get better with treatment. You develop side effects from the medicine you are taking. Your urine becomes very dark or has a bad smell. Your lower abdomen becomes distended and you have trouble passing your urine. Get help right away if: You have a fever or chills. You suddenly cannot urinate. You feel lightheaded, or very dizzy, or you faint. There are large amounts of blood or clots in the urine. Your urinary problems become hard to manage. You develop moderate to severe low back or flank pain. The flank is the side of your body between the ribs and the hip. These symptoms may represent a serious problem that is an emergency. Do not wait to see if the symptoms will go away. Get medical help right away. Call your local emergency services (911 in the [place] Do not drive yourself to the hospital. Summary Benign prostatic hyperplasia (BPH) is an enlarged prostate that is caused by the normal aging process and not by cancer. An enlarged prostate can press on the urethra. This can make it hard to pass urine. This  condition is part of a normal aging process and is more likely to develop in men over the age of 50 years. Get help right away if you suddenly cannot urinate. This information is not intended to replace advice given to you by your health care provider. Make sure you discuss any questions you have with your healthcare provider. Document Revised: 01/28/2020 Document Reviewed: 01/28/2020 Elsevier Patient Education  2022 [employer]  

## 2021-01-09 ENCOUNTER — Telehealth: Payer: Self-pay | Admitting: Internal Medicine

## 2021-01-09 DIAGNOSIS — I493 Ventricular premature depolarization: Secondary | ICD-10-CM

## 2021-01-09 NOTE — Telephone Encounter (Signed)
Patient returning call to discuss monitor results. Please call after 1:00 pm

## 2021-01-09 NOTE — Telephone Encounter (Signed)
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   Dr Lemmie Evens

## 2021-01-09 NOTE — Telephone Encounter (Signed)
Patient returning call. Patient states he was coming through the door when the call was received.

## 2021-01-10 NOTE — Telephone Encounter (Signed)
Spoke with patient and notified him of monitor results. He agrees w/plan for EP referral Order placed, message sent to scheduler

## 2021-01-15 NOTE — Progress Notes (Signed)
Cardiology Clinic Note   Patient Name: Angel Dixon Date of Encounter: 01/16/2021  Primary Care Provider:  Lemmie Evens, MD Primary Cardiologist:  Pixie Casino, MD  Patient Profile    Angel Dixon 80 year old male presents to the clinic today for follow-up evaluation of his  chest pain/tightness.  Past Medical History    Past Medical History:  Diagnosis Date   Angina    CAD (coronary artery disease)    moderate, by cath   Diabetes mellitus    DJD (degenerative joint disease)    lumbar lam 4/05   Dyslipidemia    Dysrhythmia    "palpitation", PVCs   GERD (gastroesophageal reflux disease)    Hypertension    PUD (peptic ulcer disease)    endo 03/15/10   Past Surgical History:  Procedure Laterality Date   BACK SURGERY  2005   CARDIAC CATHETERIZATION  02/22/2006   mild obs. CAD 60-70% narrowing in small inferior branch of 1st diagonal of LAD, smooth 10% narrowing of mid RCA (Dr. 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 Marella Chimes)   CARDIAC CATHETERIZATION  05/11/2011   nonobstructive CAD (Dr. Roni Bread)   COLONOSCOPY N/A 06/13/2016   Procedure: COLONOSCOPY;  Surgeon: Rogene Houston, MD;  Location: [facility];  Service: Endoscopy;  Laterality: N/A;  [place]   right   LEFT HEART CATHETERIZATION WITH CORONARY ANGIOGRAM N/A 05/14/2011   Procedure: LEFT HEART CATHETERIZATION WITH CORONARY ANGIOGRAM;  Surgeon: [name], MD;  Location: [facility] CATH LAB;  Service: Cardiovascular;  Laterality: N/A;  Coronary angiogram and possible PCI   LUMBAR LAMINECTOMY  09/2003   L2-3, 3-4, 4-5   NM MYOCAR PERF WALL MOTION  10/03/2009   bruce myoview - normal perfusion in all regions, EF 70%, no ischemia, low risk   TRANSTHORACIC ECHOCARDIOGRAM  03/28/2012   EF [id number], grade 1 diastolic dysfunction, mod AV calcification, mild MR     Allergies  Allergies  Allergen Reactions   Lisinopril Other (See Comments)    Sore throat, no obvious angioedema or cough   Sulfa Antibiotics Hives   Sulfacetamide Hives   Vioxx [Rofecoxib] Hives   Yellow Jacket Venom [Bee Venom] Swelling    History of Present Illness    Angel Dixon has a PMH of coronary artery disease (cardiac catheterization 12/12 showed 40-50% mid circumflex, 60% OM1, 40% PDA), palpitations, and chest tightness.   After his cardiac catheterization he was placed on beta blocking therapy and his symptoms improved.  He has had subsequent episodes of substernal chest pressure.  He was previously seen by Dr. Rollene Fare who had recommended Ranexa.  This also showed improvement in his symptoms.  He later on felt like he was experiencing symptoms/side effects from his beta blocking medication and it was reduced to 12.5 mg at night.  He was staying physically active and planned on resuming exercise at the [facility].   He was seen by Dr. Debara Pickett on 09/15/2020.  During that time he reported chest discomfort and palpitations.  He had previously undergone nuclear stress test which was negative for ischemia and showed normal LV function.  He reported he is change his diet and stop the supplement he was taking which she felt may be causing some symptoms.  He reported significantly reducing as nitroglycerin use and was only using it sparingly.  He reported that his symptoms have decreased.   He presented to the emergency department 10/25/2020 and was discharged on 10/26/2020.  He reported left-sided chest pressure and pain that started around 4:30 PM on that day.  He noticed some dizziness and shortness of breath.  He proceeded to take 3 sublingual nitroglycerin at the same time and had his son transport him to the emergency department.  His chest pain was nonradiating and was nonreproducible.  He rated his chest discomfort at 8 out of 10.  He denied diaphoresis, nausea, vomiting, and  abdominal pain.  His chest pain later decreased to 1 out of 10.  His troponins were flat x2.  His chest x-ray showed no acute abnormalities.  His blood pressure was 92/68.  His blood work showed normocytic anemia, BMP showed hyperglycemia.  He had no acute EKG changes.  His echocardiogram showed preserved EF and no wall abnormalities.  Cardiology was consulted and recommended starting Imdur and metoprolol.  He was advised to take aspirin on daily basis, continue his as needed nitroglycerin use, and follow a heart healthy diet.   He presented to the clinic 6/17/20212 for follow-up evaluation and stated he continued to have occasional episodes of irregular heartbeat and lightheadedness.  We reviewed his hospital lab work and diagnostics.  He expressed understanding.  He reported that he had scaled back his physical activity but did notice that when he was out doing yard work or being active he had palpitations that were accompanied by lightheadedness.  He said that they were infrequent and somewhat occasional.  He reported that he did try to stay well-hydrated.  He had not had any falls or injuries.  He reported that his activities are limited to light yard work.  His blood pressure was 146/70.  On review his blood pressures had been well controlled in the 100s-130s over 70s and 80s.  I gave him a blood pressure log, ordered a 14-day cardiac monitor, salty 6 diet sheet, maintain physical activity, and planned follow-up in 6 to 8 weeks.  Cardiac event monitor 01/02/2021 showed isolated VE's, and heavy PVC burden.  Recommendation was for referral to EP for evaluation and referral was made.  He presents to the clinic today for follow-up evaluation states he is noticing less frequent episodes of palpitations.  He reports that last week he did not have any.  He does note that week before last he had a couple episodes of palpitations that were increased with increased physical activity but did dissipate with rest.  He  reports that he has been monitoring his blood pressure at home which is been around 135/80.  Initially in the office today it was 160/80 and on recheck it was 136/82.  I will have him maintain his blood pressure log, increase his metoprolol to 37.5 twice daily, have him continue to avoid triggers and increase his physical activity as tolerated.  He plans to return to the gym to resume his walking.  He has a goal of walking 2.5 miles daily.  I will have him follow-up with the EP as scheduled and with Dr. Debara Pickett in 4 to 6 months.   Today denies chest pain, shortness of breath, lower extremity edema, fatigue, increased palpitations, melena, hematuria, hemoptysis, diaphoresis, weakness, presyncope, syncope, orthopnea, and PND.  Home Medications    Prior to Admission medications   Medication Sig Start Date End Date Taking? Authorizing Provider  alfuzosin (UROXATRAL) 10 MG 24 hr tablet Take 10 mg by mouth daily.  12/11/12  [provider]  Ascorbic Acid (VITAMIN C PO) Take 1 tablet by mouth daily.    [provider]  aspirin EC 81 MG EC tablet Take 1 tablet (81 mg total) by mouth daily. Swallow whole. Patient not taking: Reported on 11/18/2020 10/27/20   Barton Dubois, MD  atorvastatin (LIPITOR) 40 MG tablet Take 40 mg by mouth daily.    [provider]  finasteride (PROSCAR) 5 MG tablet Take 1 tablet (5 mg total) by mouth daily. 12/13/20   McKenzie, Candee Furbish, MD  fluticasone (FLONASE) 50 MCG/ACT nasal spray Place 1 spray into both nostrils as needed for allergies or rhinitis.  09/20/14   [provider]  glipiZIDE (GLUCOTROL) 5 MG tablet Take by mouth 2 (two) times daily.    [provider]  isosorbide mononitrate (IMDUR) 30 MG 24 hr tablet Take 0.5 tablets (15 mg total) by mouth daily. 10/27/20   Barton Dubois, MD  meclizine (ANTIVERT) 25 MG tablet Take 25 mg by mouth as needed for dizziness. 04/14/20   [provider]  metFORMIN (GLUCOPHAGE) 500 MG  tablet Take 500 mg by mouth 2 (two) times daily.    [provider]  metoprolol tartrate (LOPRESSOR) 25 MG tablet Take 25 mg by mouth 2 (two) times daily.    [provider]  nitroGLYCERIN (NITROSTAT) 0.4 MG SL tablet Place 1 tablet (0.4 mg total) under the tongue every 5 (five) minutes as needed for chest pain (severe chest pain or pressure only). 08/12/20 05/19/29  Pixie Casino, MD  pantoprazole (PROTONIX) 40 MG tablet Take 1 tablet (40 mg total) by mouth 2 (two) times daily. 10/26/20   Barton Dubois, MD  Polyethylene Glycol 400 (BLINK TEARS OP) Place 1 drop into both eyes 2 (two) times daily.    [provider]  silodosin (RAPAFLO) 8 MG CAPS capsule Take 1 capsule (8 mg total) by mouth at bedtime. 12/13/20   McKenzie, Candee Furbish, MD    Family History    Family History  Problem Relation Age of Onset   Coronary artery disease Father        died 37   Kidney failure Mother        died 50   Sarcoidosis Daughter        died 67   Heart attack Sister        died of MI in her 81s   He indicated that the status of his mother is unknown. He indicated that the status of his father is unknown. He indicated that the status of his sister is unknown. He indicated that the status of his daughter is unknown.  Social History    Social History   Socioeconomic History   Marital status: Married    Spouse name: Not on file   Number of children: 4   Years of education: 12   Highest education level: Not on file  Occupational History   Occupation: retired  Tobacco Use   Smoking status: Former    Packs/day: 1.00    Years: 25.00    Pack years: 25.00    Types: Cigarettes   Smokeless tobacco: Never   Tobacco comments:    quit smoking cigarettes ~ 1990  Substance and Sexual Activity   Alcohol use: Yes    Comment: 05/11/11 "shot every now and then"   Drug use: No   Sexual activity: Yes  Other Topics Concern   Not on file  Social History Narrative   Not on file  Social Determinants of Health   Financial Resource Strain: Not on file  Food Insecurity: Not on file  Transportation Needs: Not on file  Physical Activity: Not on file  Stress: Not on file  Social Connections: Not on file  Intimate Partner Violence: Not on file     Review of Systems    General:  No chills, fever, night sweats or weight changes.  Cardiovascular:  No chest pain, dyspnea on exertion, edema, orthopnea, palpitations, paroxysmal nocturnal dyspnea. Dermatological: No rash, lesions/masses Respiratory: No cough, dyspnea Urologic: No hematuria, dysuria Abdominal:   No nausea, vomiting, diarrhea, bright red blood per rectum, melena, or hematemesis Neurologic:  No visual changes, wkns, changes in mental status. All other systems reviewed and are otherwise negative except as noted above.  Physical Exam    VS:  BP 136/82   Pulse 66   Resp 20   Ht '5\' 8"'$  (1.727 m)   Wt 145 lb 12.8 oz (66.1 kg)   SpO2 96%   BMI 22.17 kg/m  , BMI Body mass index is 22.17 kg/m. GEN: Well nourished, well developed, in no acute distress. HEENT: normal. Neck: Supple, no JVD, carotid bruits, or masses. Cardiac: RRR, no murmurs, rubs, or gallops. No clubbing, cyanosis, edema.  Radials/DP/PT 2+ and equal bilaterally.  Respiratory:  Respirations regular and unlabored, clear to auscultation bilaterally. GI: Soft, nontender, nondistended, BS + x 4. MS: no deformity or atrophy. Skin: warm and dry, no rash. Neuro:  Strength and sensation are intact. Psych: Normal affect.  Accessory Clinical Findings    Recent Labs: 10/26/2020: ALT 23; BUN 21; Creatinine, Ser 1.07; Hemoglobin 11.3; Magnesium 2.0; Platelets 268; Potassium 4.2; Sodium 139   Recent Lipid Panel    Component Value Date/Time   CHOL 121 03/28/2012 0249   TRIG 222 (H) 03/28/2012 0249   HDL 30 (L) 03/28/2012 0249   CHOLHDL 4.0 03/28/2012 0249   VLDL 44 (H) 03/28/2012 0249   LDLCALC 47 03/28/2012 0249    ECG personally reviewed  by me today- none today.  Nuclear stress test 08/22/2020 The left ventricular ejection fraction is hyperdynamic (>65%). Nuclear stress EF: 70%. There was no ST segment deviation noted during stress. The study is normal.   No evidence for ischemia or infarction, normal EF.   Echocardiogram 10/26/2020 IMPRESSIONS     1. Left ventricular ejection fraction, by estimation, is 65 to 70%. The  left ventricle has normal function. The left ventricle has no regional  wall motion abnormalities. There is mild left ventricular hypertrophy.  Left ventricular diastolic parameters  are indeterminate.   2. Right ventricular systolic function is normal. The right ventricular  size is normal. Tricuspid regurgitation signal is inadequate for assessing  PA pressure.   3. The mitral valve is grossly normal. Trivial mitral valve  regurgitation.   4. The aortic valve is tricuspid. There is mild calcification of the  aortic valve. Aortic valve regurgitation is not visualized. Mild aortic  valve sclerosis is present, with no evidence of aortic valve stenosis.   5. The inferior vena cava is normal in size with greater than 50%  respiratory variability, suggesting right atrial pressure of 3 mmHg.   Cardiac event monitor 01/02/2021  Isolated VEs were frequent (20.6%, [id number]). Episode listed as NSVT (4 beats) is likely artifact, also one short run of PSVT.  Heavy burdern of PVC's, this could lead to cardiomyopathy - may need refer to EP to evaluate options for suppression.  Assessment & Plan   1.  Palpitations/presyncope-reports  less frequent episodes of palpitations.  Denies episodes last week.  Week before last he did notice intermittent periods of increased heart rate.  Cardiac event monitor showed greater than 20% PVC burden.  Continues to notice palpitations mainly after bouts of physical activity and during physical activity.  Reports that he continues to try to maintain hydration. Maintain p.o.  hydration Maintain physical activity Increase metoprolol to 37.5 in the AM and maintain 37.5 mg p.m. dosing. Follow-up with electrophysiology as planned Avoid triggers caffeine, EtOH, dehydration, chocolate etc.   Coronary artery disease-no chest pain today.  Previously noted to have mild nonobstructive CAD.  Stress test 3/22 showed no ischemia and low risk. Continue aspirin, metoprolol, Imdur, aspirin, atorvastatin, nitroglycerin Heart healthy low-sodium diet-salty 6 given Increase physical activity as tolerated   Hyperlipidemia-LDL 47 on 03/28/2012 Continue aspirin, atorvastatin Heart healthy low-sodium high-fiber diet Increase physical activity as tolerated Repeat fasting lipids and LFTs   Essential hypertension-BP today 136/82.  Well-controlled at home. Continue Imdur, metoprolol Increase metoprolol as above Heart healthy low-sodium diet-salty 6 given Increase physical activity as tolerated   GERD-no recent episodes of reflux type discomfort. Continue pantoprazole Heart healthy low-sodium diet-salty 6 given Increase physical activity as tolerated GERD diet information discussed Follows with PCP  Disposition: Follow-up with Dr. Debara Pickett in 4 to 6 months.  Follow-up with electrophysiology as scheduled.   Jossie Ng. Ruben Pyka NP-C    01/16/2021, 10:32 AM [address] Office [phone] Fax [phone]  Notice: This dictation was prepared with Dragon dictation along with smaller phrase technology. Any transcriptional errors that result from this process are unintentional and may not be corrected upon review.  I spent 13 minutes examining this patient, reviewing medications, and using patient centered shared decision making involving her cardiac care.  Prior to her visit I spent greater than 20 minutes reviewing her past medical history,  medications, and prior cardiac tests.

## 2021-01-16 ENCOUNTER — Other Ambulatory Visit: Payer: Self-pay

## 2021-01-16 ENCOUNTER — Ambulatory Visit: Payer: Medicare HMO | Admitting: General Practice

## 2021-01-16 ENCOUNTER — Encounter: Payer: Self-pay | Admitting: General Practice

## 2021-01-16 VITALS — BP 136/82 | HR 66 | Resp 20 | Ht 68.0 in | Wt 145.8 lb

## 2021-01-16 DIAGNOSIS — K219 Gastro-esophageal reflux disease without esophagitis: Secondary | ICD-10-CM

## 2021-01-16 DIAGNOSIS — E785 Hyperlipidemia, unspecified: Secondary | ICD-10-CM | POA: Diagnosis not present

## 2021-01-16 DIAGNOSIS — I10 Essential (primary) hypertension: Secondary | ICD-10-CM

## 2021-01-16 DIAGNOSIS — R002 Palpitations: Secondary | ICD-10-CM | POA: Diagnosis not present

## 2021-01-16 DIAGNOSIS — I25119 Atherosclerotic heart disease of native coronary artery with unspecified angina pectoris: Secondary | ICD-10-CM | POA: Diagnosis not present

## 2021-01-16 DIAGNOSIS — R55 Syncope and collapse: Secondary | ICD-10-CM | POA: Diagnosis not present

## 2021-01-16 MED ORDER — METOPROLOL TARTRATE 25 MG PO TABS: 37.5000 mg | ORAL_TABLET | Freq: Two times a day (BID) | ORAL | 6 refills | Status: DC

## 2021-01-16 NOTE — Patient Instructions (Signed)
Medication Instructions:  INCREASE METOPROLOL 37.5 MG TWICE DAILY *If you need a refill on your cardiac medications before your next appointment, please call your pharmacy*  Lab Work:   Testing/Procedures:  NONE    NONE  Special Instructions Please try to avoid these triggers: Do not use any products that have nicotine or tobacco in them. These include cigarettes, e-cigarettes, and chewing tobacco. If you need help quitting, ask your doctor. Eat heart-healthy foods. Talk with your doctor about the right eating plan for you. Exercise regularly as told by your doctor. Stay hydrated Do not drink alcohol, Caffeine or chocolate. Lose weight if you are overweight. Do not use drugs, including cannabis   PLEASE MAKE SURE TO STAY HYDRATED  PLEASE INCREASE PHYSICAL ACTIVITY AS TOLERATED,   Follow-Up: Your next appointment:  4-6 month(s) In Person with K. Mali Hilty, MD OR IF UNAVAILABLE [place], FNP-C   At [facility], you and your health needs are our priority.  As part of our continuing mission to provide you with exceptional heart care, we have created designated Provider Care Teams.  These Care Teams include your primary Cardiologist (physician) and Advanced Practice Providers (APPs -  Physician Assistants and Nurse Practitioners) who all work together to provide you with the care you need, when you need it.  We recommend signing up for the patient portal called "MyChart".  Sign up information is provided on this After Visit Summary.  MyChart is used to connect with patients for Virtual Visits (Telemedicine).  Patients are able to view lab/test results, encounter notes, upcoming appointments, etc.  Non-urgent messages can be sent to your provider as well.   To learn more about what you can do with MyChart, go to [email or web address].

## 2021-02-15 ENCOUNTER — Other Ambulatory Visit: Payer: Medicare HMO | Admitting: Urology

## 2021-02-22 ENCOUNTER — Other Ambulatory Visit: Payer: Self-pay

## 2021-02-22 ENCOUNTER — Encounter (INDEPENDENT_AMBULATORY_CARE_PROVIDER_SITE_OTHER): Payer: Self-pay

## 2021-02-22 ENCOUNTER — Ambulatory Visit: Payer: Medicare HMO | Admitting: Internal Medicine

## 2021-02-22 ENCOUNTER — Encounter: Payer: Self-pay | Admitting: Internal Medicine

## 2021-02-22 VITALS — BP 144/68 | HR 89 | Ht 68.0 in | Wt 146.4 lb

## 2021-02-22 DIAGNOSIS — I493 Ventricular premature depolarization: Secondary | ICD-10-CM | POA: Diagnosis not present

## 2021-02-22 DIAGNOSIS — I10 Essential (primary) hypertension: Secondary | ICD-10-CM | POA: Diagnosis not present

## 2021-02-22 MED ORDER — AMIODARONE HCL 200 MG PO TABS: ORAL_TABLET | ORAL | 3 refills | Status: DC

## 2021-02-22 MED ORDER — METOPROLOL TARTRATE 25 MG PO TABS: 25.0000 mg | ORAL_TABLET | Freq: Two times a day (BID) | ORAL | 3 refills | Status: DC

## 2021-02-22 NOTE — Patient Instructions (Addendum)
Medication Instructions:  Your physician has recommended you make the following change in your medication:   START: amiodarone 200 mg tablet: Take 1 tablet (200 mg) by mouth once a day, then on November 1st start taking 1/2 tablet (100 mg) by mouth once a day On November 1st, DECREASE metoprolol to 1 tablet (25 mg) by mouth twice a day  *If you need a refill on your cardiac medications before your next appointment, please call your pharmacy*   Lab Work: None  If you have labs (blood work) drawn today and your tests are completely normal, you will receive your results only by: [place] (if you have MyChart) OR A paper copy in the mail If you have any lab test that is abnormal or we need to change your treatment, we will call you to review the results.   Testing/Procedures: None   Follow-Up: Follow up with Dr. Lovena Le in the [place] Office on 05/09/21 at 2:30 PM

## 2021-02-22 NOTE — Progress Notes (Signed)
HPI Angel Dixon is referred today by Dr. Debara Pickett for evaluation of PVC's. He is a pleasant 80 yo man with a h/o non-obstructive CAD, preserved LV function, arthritis, HTN, and PVC's. He wore a cardiac monitor back in August demonstrating 20% PVC burden, mostly monomorphic despite medical therapy with beta blockers. He carries a h/o prior MI despite normal LV function.  His PVC's previously looked like RVOT location. The patient does not have palpitations. He feels more fatigued and weak. He energy is down. No syncope.  Allergies  Allergen Reactions   Lisinopril Other (See Comments)    Sore throat, no obvious angioedema or cough   Sulfa Antibiotics Hives   Sulfacetamide Hives   Vioxx [Rofecoxib] Hives   Yellow Jacket Venom [Bee Venom] Swelling     Current Outpatient Medications  Medication Sig Dispense Refill   Ascorbic Acid (VITAMIN C PO) Take 1 tablet by mouth daily.     aspirin EC 81 MG EC tablet Take 1 tablet (81 mg total) by mouth daily. Swallow whole. 30 tablet 11   atorvastatin (LIPITOR) 40 MG tablet Take 40 mg by mouth daily.     finasteride (PROSCAR) 5 MG tablet Take 1 tablet (5 mg total) by mouth daily. 90 tablet 3   fluticasone (FLONASE) 50 MCG/ACT nasal spray Place 1 spray into both nostrils as needed for allergies or rhinitis.   11   glipiZIDE (GLUCOTROL) 5 MG tablet Take by mouth 2 (two) times daily.     isosorbide mononitrate (IMDUR) 30 MG 24 hr tablet Take 0.5 tablets (15 mg total) by mouth daily. 30 tablet 1   meclizine (ANTIVERT) 25 MG tablet Take 25 mg by mouth as needed for dizziness.     metFORMIN (GLUCOPHAGE) 500 MG tablet Take 500 mg by mouth 2 (two) times daily.     metoprolol tartrate (LOPRESSOR) 25 MG tablet Take 1.5 tablets (37.5 mg total) by mouth 2 (two) times daily. 45 tablet 6   nitroGLYCERIN (NITROSTAT) 0.4 MG SL tablet Place 1 tablet (0.4 mg total) under the tongue every 5 (five) minutes as needed for chest pain (severe chest pain or pressure only).  25 tablet 3   ONETOUCH ULTRA test strip USE 1 STRIP TO CHECK BLOOD GLUCOSE LEVEL TWICE DAILY.     pantoprazole (PROTONIX) 40 MG tablet Take 1 tablet (40 mg total) by mouth 2 (two) times daily. 60 tablet 2   Polyethylene Glycol 400 (BLINK TEARS OP) Place 1 drop into both eyes 2 (two) times daily.     silodosin (RAPAFLO) 8 MG CAPS capsule Take 1 capsule (8 mg total) by mouth at bedtime. 30 capsule 11   No current facility-administered medications for this visit.     Past Medical History:  Diagnosis Date   Angina    CAD (coronary artery disease)    moderate, by cath   Diabetes mellitus    DJD (degenerative joint disease)    lumbar lam 4/05   Dyslipidemia    Dysrhythmia    "palpitation", PVCs   GERD (gastroesophageal reflux disease)    Hypertension    PUD (peptic ulcer disease)    endo 03/15/10    ROS:   All systems reviewed and negative except as noted in the HPI.   Past Surgical History:  Procedure Laterality Date   BACK SURGERY  2005   CARDIAC CATHETERIZATION  02/22/2006   mild obs. CAD 60-70% narrowing in small inferior branch of 1st diagonal of LAD, smooth 10% narrowing of mid  RCA (Dr. 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 Marella Chimes)   CARDIAC CATHETERIZATION  05/11/2011   nonobstructive CAD (Dr. Roni Bread)   COLONOSCOPY N/A 06/13/2016   Procedure: COLONOSCOPY;  Surgeon: Rogene Houston, MD;  [facility];  Service: Endoscopy;  Laterality: N/A;  [place]   right   LEFT HEART CATHETERIZATION WITH CORONARY ANGIOGRAM N/A 05/14/2011   Procedure: LEFT HEART CATHETERIZATION WITH CORONARY ANGIOGRAM;  Surgeon: [name], MD;  Location: [facility] CATH LAB;  Service: Cardiovascular;  Laterality: N/A;  Coronary angiogram and possible PCI   LUMBAR LAMINECTOMY  09/2003   L2-3, 3-4, 4-5   NM MYOCAR PERF WALL MOTION  10/03/2009   bruce  myoview - normal perfusion in all regions, EF 70%, no ischemia, low risk   TRANSTHORACIC ECHOCARDIOGRAM  03/28/2012   EF 67-89%, grade 1 diastolic dysfunction, mod AV calcification, mild MR     Family History  Problem Relation Age of Onset   Coronary artery disease Father        died 75   Kidney failure Mother        died 51   Sarcoidosis Daughter        died 33   Heart attack Sister        died of MI in her 12s     Social History   Socioeconomic History   Marital status: Married    Spouse name: Not on file   Number of children: 4   Years of education: 12   Highest education level: Not on file  Occupational History   Occupation: retired  Tobacco Use   Smoking status: Former    Packs/day: 1.00    Years: 25.00    Pack years: 25.00    Types: Cigarettes   Smokeless tobacco: Never   Tobacco comments:    quit smoking cigarettes ~ 1990  Substance and Sexual Activity   Alcohol use: Yes    Comment: 05/11/11 "shot every now and then"   Drug use: No   Sexual activity: Yes  Other Topics Concern   Not on file  Social History Narrative   Not on file   Social Determinants of Health   Financial Resource Strain: Not on file  Food Insecurity: Not on file  Transportation Needs: Not on file  Physical Activity: Not on file  Stress: Not on file  Social Connections: Not on file  Intimate Partner Violence: Not on file     BP (!) 144/68   Pulse 89   Ht 5\' 8"  (1.727 m)   Wt 146 lb 6.4 oz (66.4 kg)   SpO2 95%   BMI 22.26 kg/m   Physical Exam:  Well appearing NAD HEENT: Unremarkable Neck:  No JVD, no thyromegally Lymphatics:  No adenopathy Back:  No CVA tenderness Lungs:  Clear with no wheezes HEART:  IRegular rate rhythm, no murmurs, no rubs, no clicks Abd:  soft, positive bowel sounds, no organomegally, no rebound, no guarding Ext:  2 plus pulses, no edema, no cyanosis, no clubbing Skin:  No rashes no nodules Neuro:  CN II through XII intact, motor grossly  intact  EKG - NSR with PVC's in a bigeminal fashion. Transition of the PVC's is at V3.   Assess/Plan:  PVC's - because he has a h/o a small MI in the past, I am  reluctant to try a class 1C drug. Mexilitine and sotalol are options but have their drawbacks. I recommended a trial of low dose amiodarone. Catheter ablation is also an option but will hold off on this for now. HTN - I asked him to decrease his dose of metoprolol in 5 weeks. Non-obstructive CAD - he denies anginal symptoms. We will follow.  Carleene Overlie Emilynn Srinivasan,MD

## 2021-03-08 ENCOUNTER — Ambulatory Visit: Payer: Medicare HMO | Admitting: Urology

## 2021-03-08 ENCOUNTER — Other Ambulatory Visit: Payer: Self-pay

## 2021-03-08 ENCOUNTER — Encounter: Payer: Self-pay | Admitting: Urology

## 2021-03-08 VITALS — BP 168/85 | HR 93 | Temp 98.3°F | Wt 146.6 lb

## 2021-03-08 DIAGNOSIS — N138 Other obstructive and reflux uropathy: Secondary | ICD-10-CM | POA: Diagnosis not present

## 2021-03-08 DIAGNOSIS — N401 Enlarged prostate with lower urinary tract symptoms: Secondary | ICD-10-CM | POA: Diagnosis not present

## 2021-03-08 DIAGNOSIS — R351 Nocturia: Secondary | ICD-10-CM | POA: Diagnosis not present

## 2021-03-08 DIAGNOSIS — R350 Frequency of micturition: Secondary | ICD-10-CM

## 2021-03-08 LAB — URINALYSIS, ROUTINE W REFLEX MICROSCOPIC
Bilirubin, UA: NEGATIVE
Glucose, UA: NEGATIVE
Ketones, UA: NEGATIVE
Leukocytes,UA: NEGATIVE
Nitrite, UA: NEGATIVE
Protein,UA: NEGATIVE
RBC, UA: NEGATIVE
Specific Gravity, UA: 1.015 (ref 1.005–1.030)
Urobilinogen, Ur: 1 mg/dL (ref 0.2–1.0)
pH, UA: 7 (ref 5.0–7.5)

## 2021-03-08 MED ORDER — SILODOSIN 8 MG PO CAPS: 8.0000 mg | ORAL_CAPSULE | Freq: Every day | ORAL | 3 refills | Status: DC

## 2021-03-08 MED ORDER — FINASTERIDE 5 MG PO TABS: 5.0000 mg | ORAL_TABLET | Freq: Every day | ORAL | 3 refills | Status: DC

## 2021-03-08 NOTE — Patient Instructions (Signed)
Benign Prostatic Hyperplasia Benign prostatic hyperplasia (BPH) is an enlarged prostate gland that is caused by the normal aging process and not by cancer. The prostate is a walnut-sized gland that is involved in the production of semen. It is located in front of the rectum and below the bladder. The bladder stores urine and the urethra is the tube that carries the urine out of the body. The prostate may get bigger as a man gets older. An enlarged prostate can press on the urethra. This can make it harder to pass urine. The build-up of urine in the bladder can cause infection. Back pressure and infection may progress to bladder damage and kidney (renal) failure. What are the causes? This condition is part of a normal aging process. However, not all men develop problems from this condition. If the prostate enlarges away from the urethra, urine flow will not be blocked. If it enlarges toward the urethra and compresses it, there will be problems passing urine. What increases the risk? This condition is more likely to develop in men over the age of 50 years. What are the signs or symptoms? Symptoms of this condition include: Getting up often during the night to urinate. Needing to urinate frequently during the day. Difficulty starting urine flow. Decrease in size and strength of your urine stream. Leaking (dribbling) after urinating. Inability to pass urine. This needs immediate treatment. Inability to completely empty your bladder. Pain when you pass urine. This is more common if there is also an infection. Urinary tract infection (UTI). How is this diagnosed? This condition is diagnosed based on your medical history, a physical exam, and your symptoms. Tests will also be done, such as: A post-void bladder scan. This measures any amount of urine that may remain in your bladder after you finish urinating. A digital rectal exam. In a rectal exam, your health care provider checks your prostate by  putting a lubricated, gloved finger into your rectum to feel the back of your prostate gland. This exam detects the size of your gland and any abnormal lumps or growths. An exam of your urine (urinalysis). A prostate specific antigen (PSA) screening. This is a blood test used to screen for prostate cancer. An ultrasound. This test uses sound waves to electronically produce a picture of your prostate gland. Your health care provider may refer you to a specialist in kidney and prostate diseases (urologist). How is this treated? Once symptoms begin, your health care provider will monitor your condition (active surveillance or watchful waiting). Treatment for this condition will depend on the severity of your condition. Treatment may include: Observation and yearly exams. This may be the only treatment needed if your condition and symptoms are mild. Medicines to relieve your symptoms, including: Medicines to shrink the prostate. Medicines to relax the muscle of the prostate. Surgery in severe cases. Surgery may include: Prostatectomy. In this procedure, the prostate tissue is removed completely through an open incision or with a laparoscope or robotics. Transurethral resection of the prostate (TURP). In this procedure, a tool is inserted through the opening at the tip of the penis (urethra). It is used to cut away tissue of the inner core of the prostate. The pieces are removed through the same opening of the penis. This removes the blockage. Transurethral incision (TUIP). In this procedure, small cuts are made in the prostate. This lessens the prostate's pressure on the urethra. Transurethral microwave thermotherapy (TUMT). This procedure uses microwaves to create heat. The heat destroys and removes a   small amount of prostate tissue. Transurethral needle ablation (TUNA). This procedure uses radio frequencies to destroy and remove a small amount of prostate tissue. Interstitial laser coagulation (ILC).  This procedure uses a laser to destroy and remove a small amount of prostate tissue. Transurethral electrovaporization (TUVP). This procedure uses electrodes to destroy and remove a small amount of prostate tissue. Prostatic urethral lift. This procedure inserts an implant to push the lobes of the prostate away from the urethra. Follow these instructions at home: Take over-the-counter and prescription medicines only as told by your health care provider. Monitor your symptoms for any changes. Contact your health care provider with any changes. Avoid drinking large amounts of liquid before going to bed or out in public. Avoid or reduce how much caffeine or alcohol you drink. Give yourself time when you urinate. Keep all follow-up visits as told by your health care provider. This is important. Contact a health care provider if: You have unexplained back pain. Your symptoms do not get better with treatment. You develop side effects from the medicine you are taking. Your urine becomes very dark or has a bad smell. Your lower abdomen becomes distended and you have trouble passing your urine. Get help right away if: You have a fever or chills. You suddenly cannot urinate. You feel lightheaded, or very dizzy, or you faint. There are large amounts of blood or clots in the urine. Your urinary problems become hard to manage. You develop moderate to severe low back or flank pain. The flank is the side of your body between the ribs and the hip. These symptoms may represent a serious problem that is an emergency. Do not wait to see if the symptoms will go away. Get medical help right away. Call your local emergency services (911 in the [place] Do not drive yourself to the hospital. Summary Benign prostatic hyperplasia (BPH) is an enlarged prostate that is caused by the normal aging process and not by cancer. An enlarged prostate can press on the urethra. This can make it hard to pass urine. This  condition is part of a normal aging process and is more likely to develop in men over the age of 50 years. Get help right away if you suddenly cannot urinate. This information is not intended to replace advice given to you by your health care provider. Make sure you discuss any questions you have with your health care provider. Document Revised: 08/31/2020 Document Reviewed: 01/28/2020 Elsevier Patient Education  2022 Elsevier Inc[employer]  

## 2021-03-08 NOTE — Progress Notes (Signed)

## 2021-03-08 NOTE — Progress Notes (Signed)
03/08/2021 12:14 PM   Azion ERRON WENGERT Aug 30, 1940 [id number]  Referring provider: Lemmie Evens, MD [address],  [place] [address]  Nocturia   HPI: Mr Sedivy is a 80yo here for followup for BPH, Nocturia and urinary frequency. IPSS 11 QOL 2. He is currently on rapaflo 8mg  and finasteride 5 mg daily. Nocturia 1-2x based on fluid consumption. Urine stream strong. Urinary frequency every 2-3 hours. No dysuria or hematuria. No other complaints today   PMH: Past Medical History:  Diagnosis Date   Angina    CAD (coronary artery disease)    moderate, by cath   Diabetes mellitus    DJD (degenerative joint disease)    lumbar lam 4/05   Dyslipidemia    Dysrhythmia    "palpitation", PVCs   GERD (gastroesophageal reflux disease)    Hypertension    PUD (peptic ulcer disease)    endo 03/15/10    Surgical History: Past Surgical History:  Procedure Laterality Date   BACK SURGERY  2005   CARDIAC CATHETERIZATION  02/22/2006   mild obs. CAD 60-70% narrowing in small inferior branch of 1st diagonal of LAD, smooth 10% narrowing of mid RCA (Dr. 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 Marella Chimes)   CARDIAC CATHETERIZATION  05/11/2011   nonobstructive CAD (Dr. Roni Bread)   COLONOSCOPY N/A 06/13/2016   Procedure: COLONOSCOPY;  Surgeon: Rogene Houston, MD;  Location: [facility];  Service: Endoscopy;  Laterality: N/A;  [place]   right   LEFT HEART CATHETERIZATION WITH CORONARY ANGIOGRAM N/A 05/14/2011   Procedure: LEFT HEART CATHETERIZATION WITH CORONARY ANGIOGRAM;  Surgeon: [name], MD;  Location: [facility] CATH LAB;  Service: Cardiovascular;  Laterality: N/A;  Coronary angiogram and possible PCI   LUMBAR LAMINECTOMY  09/2003   L2-3, 3-4, 4-5   NM MYOCAR PERF WALL MOTION  10/03/2009   bruce myoview - normal perfusion in all regions, EF  70%, no ischemia, low risk   TRANSTHORACIC ECHOCARDIOGRAM  03/28/2012   EF 10-93%, grade 1 diastolic dysfunction, mod AV calcification, mild MR    Home Medications:  Allergies as of 03/08/2021       Reactions   Lisinopril Other (See Comments)   Sore throat, no obvious angioedema or cough   Sulfa Antibiotics Hives   Sulfacetamide Hives   Vioxx [rofecoxib] Hives   Yellow Jacket Venom [bee Venom] Swelling        Medication List        Accurate as of March 08, 2021 12:14 PM. If you have any questions, ask your nurse or doctor.          amiodarone 200 MG tablet Commonly known as: PACERONE Take 1 tablet (200 mg) by mouth once a day, then on November 1st start taking 1/2 tablet (100 mg) by mouth once a day   aspirin 81 MG EC tablet Take 1 tablet (81 mg total) by mouth daily. Swallow whole.   atorvastatin 40 MG tablet Commonly known as: LIPITOR Take 40 mg by mouth daily.   BLINK TEARS OP Place 1 drop into both eyes 2 (two) times daily.   finasteride 5 MG tablet Commonly known as: PROSCAR Take 1 tablet (5 mg total) by mouth daily.   fluticasone 50 MCG/ACT nasal spray Commonly known as: FLONASE Place 1 spray into both nostrils as needed for  allergies or rhinitis.   glipiZIDE 5 MG tablet Commonly known as: GLUCOTROL Take by mouth 2 (two) times daily.   isosorbide mononitrate 30 MG 24 hr tablet Commonly known as: IMDUR Take 0.5 tablets (15 mg total) by mouth daily.   meclizine 25 MG tablet Commonly known as: ANTIVERT Take 25 mg by mouth as needed for dizziness.   metFORMIN 500 MG tablet Commonly known as: GLUCOPHAGE Take 500 mg by mouth 2 (two) times daily.   metoprolol tartrate 25 MG tablet Commonly known as: LOPRESSOR Take 1 tablet (25 mg total) by mouth 2 (two) times daily. Start taking on: April 04, 2021   nitroGLYCERIN 0.4 MG SL tablet Commonly known as: NITROSTAT Place 1 tablet (0.4 mg total) under the tongue every 5 (five) minutes as needed for  chest pain (severe chest pain or pressure only).   OneTouch Ultra test strip Generic drug: glucose blood USE 1 STRIP TO CHECK BLOOD GLUCOSE LEVEL TWICE DAILY.   pantoprazole 40 MG tablet Commonly known as: PROTONIX Take 1 tablet (40 mg total) by mouth 2 (two) times daily.   silodosin 8 MG Caps capsule Commonly known as: RAPAFLO Take 1 capsule (8 mg total) by mouth at bedtime.   VITAMIN C PO Take 1 tablet by mouth daily.        Allergies:  Allergies  Allergen Reactions   Lisinopril Other (See Comments)    Sore throat, no obvious angioedema or cough   Sulfa Antibiotics Hives   Sulfacetamide Hives   Vioxx [Rofecoxib] Hives   Yellow Jacket Venom [Bee Venom] Swelling    Family History: Family History  Problem Relation Age of Onset   Coronary artery disease Father        died 63   Kidney failure Mother        died 25   Sarcoidosis Daughter        died 29   Heart attack Sister        died of MI in her 43s    Social History:  reports that he has quit smoking. His smoking use included cigarettes. He has a 25.00 pack-year smoking history. He has never used smokeless tobacco. He reports current alcohol use. He reports that he does not use drugs.  ROS: All other review of systems were reviewed and are negative except what is noted above in HPI  Physical Exam: BP (!) 168/85   Pulse 93   Temp 98.3 F (36.8 C)   Wt 146 lb 9.6 oz (66.5 kg)   BMI 22.29 kg/m   Constitutional:  Alert and oriented, No acute distress. HEENT: [place] AT, moist mucus membranes.  Trachea midline, no masses. Cardiovascular: No clubbing, cyanosis, or edema. Respiratory: Normal respiratory effort, no increased work of breathing. GI: Abdomen is soft, nontender, nondistended, no abdominal masses GU: No CVA tenderness.  Lymph: No cervical or inguinal lymphadenopathy. Skin: No rashes, bruises or suspicious lesions. Neurologic: Grossly intact, no focal deficits, moving all 4 extremities. Psychiatric:  Normal mood and affect.  Laboratory Data: Lab Results  Component Value Date   WBC 5.1 10/26/2020   HGB 11.3 (L) 10/26/2020   HCT 34.2 (L) 10/26/2020   MCV 87.5 10/26/2020   PLT 268 10/26/2020    Lab Results  Component Value Date   CREATININE 1.07 10/26/2020    No results found for: PSA  No results found for: TESTOSTERONE  Lab Results  Component Value Date   HGBA1C 7.4 (H) 03/28/2012    Urinalysis    Component  Value Date/Time   COLORURINE YELLOW 11/18/2016 0152   APPEARANCEUR Clear 12/13/2020 1702   LABSPEC 1.016 11/18/2016 0152   PHURINE 5.0 11/18/2016 0152   GLUCOSEU Negative 12/13/2020 1702   HGBUR NEGATIVE 11/18/2016 0152   BILIRUBINUR Negative 12/13/2020 1702   KETONESUR NEGATIVE 11/18/2016 0152   PROTEINUR Negative 12/13/2020 1702   PROTEINUR NEGATIVE 11/18/2016 0152   UROBILINOGEN 0.2 12/20/2014 1314   NITRITE Negative 12/13/2020 1702   NITRITE NEGATIVE 11/18/2016 0152   LEUKOCYTESUR Negative 12/13/2020 1702    Lab Results  Component Value Date   LABMICR Comment 12/13/2020    Pertinent Imaging:  Results for orders placed during the hospital encounter of 09/04/03  DG Abd 1 View  Narrative Clinical Data:    Postop from lumbar spine surgery.  Abdominal distention and pain. ABDOMEN, ONE VIEW Diffuse gaseous distention of colon is seen as well as small bowel loops and with gas noted to the level of the rectosigmoid region.  This is consistent with a postoperative ileus.  Midline abdominal skin staples are seen. IMPRESSION Ileus pattern.  Provider: Otis Dials  No results found for this or any previous visit.  No results found for this or any previous visit.  No results found for this or any previous visit.  Results for orders placed during the hospital encounter of 06/19/16  US Renal  Narrative CLINICAL DATA:  BILATERAL flank pain, history hypertension, diabetes mellitus, GERD, coronary artery disease  EXAM: RENAL / URINARY TRACT  ULTRASOUND COMPLETE  COMPARISON:  CT abdomen and pelvis 02/20/2007  FINDINGS: Right Kidney:  Length: 11.2 cm. Normal cortical thickness with upper normal cortical echogenicity. Large bilobed central RIGHT renal cyst 6.5 x 3.8 x 3.0 cm, increased in size since previous exam. Slightly irregular septations at the central portion complicate the lesion. No additional mass, hydronephrosis or shadowing calcification.  Left Kidney:  Length: 11.9 cm. Normal cortical thickness. Upper normal cortical echogenicity. No mass, hydronephrosis, or shadowing calcification  Bladder:  Normal appearance. Enlarged prostate gland 6.8 x 5.2 x 5.9 cm noted.  IMPRESSION: Interval increase in size of a bilobed cyst at the mid RIGHT kidney, demonstrating slightly irregular and thick septation at the central portion ; further characterization of this lesion by MR imaging with and without contrast recommended to exclude cystic renal neoplasm.  Significant prostatic enlargement.   Electronically Signed By: Lavonia Dana M.D. On: 06/19/2016 15:11  No results found for this or any previous visit.  No results found for this or any previous visit.  No results found for this or any previous visit.   Assessment & Plan:    1. Benign prostatic hyperplasia with urinary obstruction -rapaflo 8mg  and finasteride 5mg   2. Urinary frequency Continue rapaflo 8mg  and finasteride  3. Nocturia -continue rapaflo 8mg    No follow-ups on file.  Nicolette Bang, MD  [facility] [place]

## 2021-05-09 ENCOUNTER — Other Ambulatory Visit: Payer: Self-pay

## 2021-05-09 ENCOUNTER — Encounter: Payer: Self-pay | Admitting: Internal Medicine

## 2021-05-09 ENCOUNTER — Ambulatory Visit: Payer: Medicare HMO | Admitting: Internal Medicine

## 2021-05-09 VITALS — BP 148/66 | HR 78 | Ht 68.0 in | Wt 146.6 lb

## 2021-05-09 DIAGNOSIS — I10 Essential (primary) hypertension: Secondary | ICD-10-CM | POA: Diagnosis not present

## 2021-05-09 MED ORDER — AMIODARONE HCL 200 MG PO TABS: 200.0000 mg | ORAL_TABLET | Freq: Every day | ORAL | 3 refills | Status: DC

## 2021-05-09 NOTE — Progress Notes (Signed)
HPI Angel Dixon returns today for followup. He has a h/o mostly monomorphic PVC's who was prescribed amiodarone but the script was not filled due to potential interactions with metformin. The patient otherwise feels well. No syncope. His blood sugar has been controlled. Allergies  Allergen Reactions   Lisinopril Other (See Comments)    Sore throat, no obvious angioedema or cough   Sulfa Antibiotics Hives   Sulfacetamide Hives   Vioxx [Rofecoxib] Hives   Yellow Jacket Venom [Bee Venom] Swelling     Current Outpatient Medications  Medication Sig Dispense Refill   amiodarone (PACERONE) 200 MG tablet Take 1 tablet (200 mg) by mouth once a day, then on November 1st start taking 1/2 tablet (100 mg) by mouth once a day 90 tablet 3   Ascorbic Acid (VITAMIN C PO) Take 1 tablet by mouth daily.     aspirin EC 81 MG EC tablet Take 1 tablet (81 mg total) by mouth daily. Swallow whole. 30 tablet 11   atorvastatin (LIPITOR) 40 MG tablet Take 40 mg by mouth daily.     finasteride (PROSCAR) 5 MG tablet Take 1 tablet (5 mg total) by mouth daily. 90 tablet 3   fluticasone (FLONASE) 50 MCG/ACT nasal spray Place 1 spray into both nostrils as needed for allergies or rhinitis.   11   glipiZIDE (GLUCOTROL) 5 MG tablet Take by mouth 2 (two) times daily.     isosorbide mononitrate (IMDUR) 30 MG 24 hr tablet Take 0.5 tablets (15 mg total) by mouth daily. 30 tablet 1   meclizine (ANTIVERT) 25 MG tablet Take 25 mg by mouth as needed for dizziness.     metFORMIN (GLUCOPHAGE) 500 MG tablet Take 500 mg by mouth 2 (two) times daily.     metoprolol tartrate (LOPRESSOR) 25 MG tablet Take 1 tablet (25 mg total) by mouth 2 (two) times daily. 180 tablet 3   nitroGLYCERIN (NITROSTAT) 0.4 MG SL tablet Place 1 tablet (0.4 mg total) under the tongue every 5 (five) minutes as needed for chest pain (severe chest pain or pressure only). 25 tablet 3   ONETOUCH ULTRA test strip USE 1 STRIP TO CHECK BLOOD GLUCOSE LEVEL TWICE  DAILY.     pantoprazole (PROTONIX) 40 MG tablet Take 1 tablet (40 mg total) by mouth 2 (two) times daily. 60 tablet 2   Polyethylene Glycol 400 (BLINK TEARS OP) Place 1 drop into both eyes 2 (two) times daily.     silodosin (RAPAFLO) 8 MG CAPS capsule Take 1 capsule (8 mg total) by mouth at bedtime. 90 capsule 3   No current facility-administered medications for this visit.     Past Medical History:  Diagnosis Date   Angina    CAD (coronary artery disease)    moderate, by cath   Diabetes mellitus    DJD (degenerative joint disease)    lumbar lam 4/05   Dyslipidemia    Dysrhythmia    "palpitation", PVCs   GERD (gastroesophageal reflux disease)    Hypertension    PUD (peptic ulcer disease)    endo 03/15/10    ROS:   All systems reviewed and negative except as noted in the HPI.   Past Surgical History:  Procedure Laterality Date   BACK SURGERY  2005   CARDIAC CATHETERIZATION  02/22/2006   mild obs. CAD 60-70% narrowing in small inferior branch of 1st diagonal of LAD, smooth 10% narrowing of mid RCA (Dr. 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 Marella Chimes)   CARDIAC CATHETERIZATION  05/11/2011   nonobstructive CAD (Dr. Roni Bread)   COLONOSCOPY N/A 06/13/2016   Procedure: COLONOSCOPY;  Surgeon: Rogene Houston, MD;  [facility];  Service: Endoscopy;  Laterality: N/A;  [place]   right   LEFT HEART CATHETERIZATION WITH CORONARY ANGIOGRAM N/A 05/14/2011   Procedure: LEFT HEART CATHETERIZATION WITH CORONARY ANGIOGRAM;  Surgeon: [name], MD;  Location: [facility] CATH LAB;  Service: Cardiovascular;  Laterality: N/A;  Coronary angiogram and possible PCI   LUMBAR LAMINECTOMY  09/2003   L2-3, 3-4, 4-5   NM MYOCAR PERF WALL MOTION  10/03/2009   bruce myoview - normal perfusion in all regions, EF 70%, no ischemia, low risk   TRANSTHORACIC  ECHOCARDIOGRAM  03/28/2012   EF 87-86%, grade 1 diastolic dysfunction, mod AV calcification, mild MR     Family History  Problem Relation Age of Onset   Coronary artery disease Father        died 28   Kidney failure Mother        died 98   Sarcoidosis Daughter        died 50   Heart attack Sister        died of MI in her 83s     Social History   Socioeconomic History   Marital status: Married    Spouse name: Not on file   Number of children: 4   Years of education: 12   Highest education level: Not on file  Occupational History   Occupation: retired  Tobacco Use   Smoking status: Former    Packs/day: 1.00    Years: 25.00    Pack years: 25.00    Types: Cigarettes   Smokeless tobacco: Never   Tobacco comments:    quit smoking cigarettes ~ 1990  Vaping Use   Vaping Use: Never used  Substance and Sexual Activity   Alcohol use: Yes    Comment: 05/11/11 "shot every now and then"   Drug use: No   Sexual activity: Yes  Other Topics Concern   Not on file  Social History Narrative   Not on file   Social Determinants of Health   Financial Resource Strain: Not on file  Food Insecurity: Not on file  Transportation Needs: Not on file  Physical Activity: Not on file  Stress: Not on file  Social Connections: Not on file  Intimate Partner Violence: Not on file     BP (!) 148/66   Pulse 78   Ht 5\' 8"  (1.727 m)   Wt 146 lb 9.6 oz (66.5 kg)   SpO2 96%   BMI 22.29 kg/m   Physical Exam:  Well appearing NAD HEENT: Unremarkable Neck:  No JVD, no thyromegally Lymphatics:  No adenopathy Back:  No CVA tenderness Lungs:  Clear with no wheezes HEART:  Regular rate rhythm, no murmurs, no rubs, no clicks Abd:  soft, positive bowel sounds, no organomegally, no rebound, no guarding Ext:  2 plus pulses, no edema, no cyanosis, no clubbing Skin:  No rashes no nodules Neuro:  CN II through XII intact, motor grossly intact  EKG - nsr with frequent PVC's  Assess/Plan:   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 He would like to take amio and stop the metformin. I will see him back in 3 months. CAD - he denies anginal symptoms. We will follow. HTN - his bp is controlled reasonably well at home. SBP is high today.  DM - I asked him to reach out to his primary MD for alternative meds for his DM.  Carleene Overlie Angel Mcnamara,MD

## 2021-05-09 NOTE — Patient Instructions (Addendum)
Medication Instructions:   Stop Taking Metformin  Start Taking Amiodarone 200 mg  *If you need a refill on your cardiac medications before your next appointment, please call your pharmacy*   Lab Work: NONE   If you have labs (blood work) drawn today and your tests are completely normal, you will receive your results only by: [place] (if you have MyChart) OR A paper copy in the mail If you have any lab test that is abnormal or we need to change your treatment, we will call you to review the results.   Testing/Procedures: NONE    Follow-Up: At [facility], you and your health needs are our priority.  As part of our continuing mission to provide you with exceptional heart care, we have created designated Provider Care Teams.  These Care Teams include your primary Cardiologist (physician) and Advanced Practice Providers (APPs -  Physician Assistants and Nurse Practitioners) who all work together to provide you with the care you need, when you need it.  We recommend signing up for the patient portal called "MyChart".  Sign up information is provided on this After Visit Summary.  MyChart is used to connect with patients for Virtual Visits (Telemedicine).  Patients are able to view lab/test results, encounter notes, upcoming appointments, etc.  Non-urgent messages can be sent to your provider as well.   To learn more about what you can do with MyChart, go to [email or web address].    Your next appointment:   3 month(s)  The format for your next appointment:   In Person  Provider:   Cristopher Peru, MD    Other Instructions Thank you for choosing [facility]!

## 2021-05-30 ENCOUNTER — Encounter (INDEPENDENT_AMBULATORY_CARE_PROVIDER_SITE_OTHER): Payer: Self-pay | Admitting: *Deleted

## 2021-07-28 ENCOUNTER — Ambulatory Visit: Payer: Medicare HMO | Admitting: Internal Medicine

## 2021-08-08 ENCOUNTER — Telehealth: Payer: Self-pay | Admitting: Internal Medicine

## 2021-08-08 NOTE — Telephone Encounter (Signed)
Spoke with P. Owens Shark at Dr. Burnard Hawthorne office. She stated that pt reported not taking Amiodarone 200 mg tablets or Imdur 15 mg tablets daily. Pt is still currently taking Metformin. ? ?I will [place] provider.  ?

## 2021-08-08 NOTE — Telephone Encounter (Signed)
Office is calling to see what medication the patient is supposed to be taking and not taking.Please advise ?

## 2021-08-15 ENCOUNTER — Encounter: Payer: Self-pay | Admitting: Internal Medicine

## 2021-08-15 ENCOUNTER — Ambulatory Visit: Payer: Medicare HMO | Admitting: Internal Medicine

## 2021-08-15 VITALS — BP 158/82 | HR 68 | Ht 67.0 in | Wt 147.0 lb

## 2021-08-15 DIAGNOSIS — Z79899 Other long term (current) drug therapy: Secondary | ICD-10-CM

## 2021-08-15 NOTE — Patient Instructions (Signed)
Medication Instructions:  ?Your physician recommends that you continue on your current medications as directed. Please refer to the Current Medication list given to you today. ? ?*If you need a refill on your cardiac medications before your next appointment, please call your pharmacy* ? ? ?Lab Work: ?Your physician recommends that you return for lab work in: 1 yr  ? ? ? ?If you have labs (blood work) drawn today and your tests are completely normal, you will receive your results only by: ?MyChart Message (if you have MyChart) OR ?A paper copy in the mail ?If you have any lab test that is abnormal or we need to change your treatment, we will call you to review the results. ? ? ?Testing/Procedures: ?NONE  ? ? ?Follow-Up: ?At [facility], you and your health needs are our priority.  As part of our continuing mission to provide you with exceptional heart care, we have created designated Provider Care Teams.  These Care Teams include your primary Cardiologist (physician) and Advanced Practice Providers (APPs -  Physician Assistants and Nurse Practitioners) who all work together to provide you with the care you need, when you need it. ? ?We recommend signing up for the patient portal called "MyChart".  Sign up information is provided on this After Visit Summary.  MyChart is used to connect with patients for Virtual Visits (Telemedicine).  Patients are able to view lab/test results, encounter notes, upcoming appointments, etc.  Non-urgent messages can be sent to your provider as well.   ?To learn more about what you can do with MyChart, go to [email or web address].   ? ?Your next appointment:   ?1 year(s) ? ?The format for your next appointment:   ?In Person ? ?Provider:   ?Angel Peru, MD  ? ? ?Other Instructions ?Thank you for choosing [facility]! ?  ? ? ?

## 2021-08-15 NOTE — Progress Notes (Signed)
? ? ? ? ?HPI ?Angel Dixon returns today for followup. He has a h/o mostly monomorphic PVC's who was prescribed amiodarone but the script was not filled due to potential interactions with metformin. The patient otherwise feels well. No syncope. His blood sugar has been controlled. He has stopped the metformin and has started amiodarone and Januvia. His symptoms are improved. His dyspnea is better.  ?Allergies  ?Allergen Reactions  ? Lisinopril Other (See Comments)  ?  Sore throat, no obvious angioedema or cough  ? Sulfa Antibiotics Hives  ? Sulfacetamide Hives  ? Vioxx [Rofecoxib] Hives  ? Yellow Jacket Venom [Bee Venom] Swelling  ? ? ? ?Current Outpatient Medications  ?Medication Sig Dispense Refill  ? amiodarone (PACERONE) 200 MG tablet Take 1 tablet (200 mg total) by mouth daily. 90 tablet 3  ? Ascorbic Acid (VITAMIN C PO) Take 1 tablet by mouth daily.    ? aspirin EC 81 MG tablet Take 81 mg by mouth daily. Swallow whole.    ? atorvastatin (LIPITOR) 40 MG tablet Take 40 mg by mouth daily.    ? cetirizine (ZYRTEC) 10 MG tablet Take 10 mg by mouth daily as needed.    ? finasteride (PROSCAR) 5 MG tablet Take 1 tablet (5 mg total) by mouth daily. 90 tablet 3  ? fluticasone (FLONASE) 50 MCG/ACT nasal spray Place 1 spray into both nostrils as needed for allergies or rhinitis.   11  ? glipiZIDE (GLUCOTROL) 5 MG tablet Take by mouth 2 (two) times daily.    ? isosorbide mononitrate (IMDUR) 30 MG 24 hr tablet Take 0.5 tablets (15 mg total) by mouth daily. 30 tablet 1  ? meclizine (ANTIVERT) 25 MG tablet Take 25 mg by mouth as needed for dizziness.    ? metoprolol tartrate (LOPRESSOR) 25 MG tablet Take 1 tablet (25 mg total) by mouth 2 (two) times daily. 180 tablet 3  ? nitroGLYCERIN (NITROSTAT) 0.4 MG SL tablet Place 1 tablet (0.4 mg total) under the tongue every 5 (five) minutes as needed for chest pain (severe chest pain or pressure only). 25 tablet 3  ? ONETOUCH ULTRA test strip USE 1 STRIP TO CHECK BLOOD GLUCOSE  LEVEL TWICE DAILY.    ? pantoprazole (PROTONIX) 40 MG tablet Take 1 tablet (40 mg total) by mouth 2 (two) times daily. 60 tablet 2  ? Polyethylene Glycol 400 (BLINK TEARS OP) Place 1 drop into both eyes 2 (two) times daily.    ? silodosin (RAPAFLO) 8 MG CAPS capsule Take 1 capsule (8 mg total) by mouth at bedtime. 90 capsule 3  ? sitaGLIPtin (JANUVIA) 100 MG tablet Take 100 mg by mouth daily.    ? ?No current facility-administered medications for this visit.  ? ? ? ?Past Medical History:  ?Diagnosis Date  ? Angina   ? CAD (coronary artery disease)   ? moderate, by cath  ? Diabetes mellitus   ? DJD (degenerative joint disease)   ? lumbar lam 4/05  ? Dyslipidemia   ? Dysrhythmia   ? "palpitation", PVCs  ? GERD (gastroesophageal reflux disease)   ? Hypertension   ? PUD (peptic ulcer disease)   ? endo 03/15/10  ? ? ?ROS: ? ? All systems reviewed and negative except as noted in the HPI. ? ? ?Past Surgical History:  ?Procedure Laterality Date  ? BACK SURGERY  2005  ? CARDIAC CATHETERIZATION  02/22/2006  ? mild obs. CAD 60-70% narrowing in small inferior branch of 1st diagonal of LAD, smooth 10% narrowing  of mid RCA (Dr. Corky Downs)  ? CARDIAC CATHETERIZATION  04/27/2008  ? 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 Marella Chimes)  ? CARDIAC CATHETERIZATION  05/11/2011  ? nonobstructive CAD (Dr. Roni Bread)  ? COLONOSCOPY N/A 06/13/2016  ? Procedure: COLONOSCOPY;  Surgeon: Rogene Houston, MD;  [facility];  Service: Endoscopy;  Laterality: N/A;  1200  ? HERNIA REPAIR    ? [place]  ? right  ? LEFT HEART CATHETERIZATION WITH CORONARY ANGIOGRAM N/A 05/14/2011  ? Procedure: LEFT HEART CATHETERIZATION WITH CORONARY ANGIOGRAM;  Surgeon: [name], MD;  Location[facility] CATH LAB;  Service: Cardiovascular;  Laterality: N/A;  Coronary angiogram and possible PCI  ? LUMBAR LAMINECTOMY  09/2003  ? L2-3, 3-4, 4-5  ? NM MYOCAR PERF WALL MOTION  10/03/2009  ? bruce myoview -  normal perfusion in all regions, EF 70%, no ischemia, low risk  ? TRANSTHORACIC ECHOCARDIOGRAM  03/28/2012  ? EF 43-32%, grade 1 diastolic dysfunction, mod AV calcification, mild MR  ? ? ? ?Family History  ?Problem Relation Age of Onset  ? Kidney failure Mother   ?     died 32  ? Coronary artery disease Father   ?     died 8  ? Heart attack Sister   ?     died of MI in her 63s  ? Sarcoidosis Daughter   ?     died 15  ? ? ? ?Social History  ? ?Socioeconomic History  ? Marital status: Married  ?  Spouse name: Not on file  ? Number of children: 4  ? Years of education: 46  ? Highest education level: Not on file  ?Occupational History  ? Occupation: retired  ?Tobacco Use  ? Smoking status: Former  ?  Packs/day: 1.00  ?  Years: 25.00  ?  Pack years: 25.00  ?  Types: Cigarettes  ? Smokeless tobacco: Never  ? Tobacco comments:  ?  quit smoking cigarettes ~ 1990  ?Vaping Use  ? Vaping Use: Never used  ?Substance and Sexual Activity  ? Alcohol use: Yes  ?  Comment: 05/11/11 "shot every now and then"  ? Drug use: No  ? Sexual activity: Yes  ?Other Topics Concern  ? Not on file  ?Social History Narrative  ? Not on file  ? ?Social Determinants of Health  ? ?Financial Resource Strain: Not on file  ?Food Insecurity: Not on file  ?Transportation Needs: Not on file  ?Physical Activity: Not on file  ?Stress: Not on file  ?Social Connections: Not on file  ?Intimate Partner Violence: Not on file  ? ? ? ?BP (!) 158/82   Pulse 68   Ht '5\' 7"'$  (1.702 m)   Wt 147 lb (66.7 kg)   SpO2 98%   BMI 23.02 kg/m?  ? ?Physical Exam: ? ?Well appearing NAD ?HEENT: Unremarkable ?Neck:  No JVD, no thyromegally ?Lymphatics:  No adenopathy ?Back:  No CVA tenderness ?Lungs:  Clear with no wheezes ?HEART:  Regular rate rhythm, no murmurs, no rubs, no clicks ?Abd:  soft, positive bowel sounds, no organomegally, no rebound, no guarding ?Ext:  2 plus pulses, no edema, no cyanosis, no clubbing ?Skin:  No rashes no nodules ?Neuro:  CN II through XII intact,  motor grossly intact ? ? ?Assess/Plan:  ? ? 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 He will continue amiodarone 200 mg daily and we will recheck labs in  a year. I would anticipate checking his heart monitor in a year. ?CAD - he denies anginal symptoms. We will follow. ?HTN - his bp is controlled reasonably well at home. SBP is high today.  ?DM - He is tolerating Januvia ?  ?Carleene Overlie Rheanne Cortopassi,MD ?

## 2021-08-17 NOTE — Telephone Encounter (Signed)
Med changes noted ?

## 2021-08-29 ENCOUNTER — Other Ambulatory Visit (HOSPITAL_COMMUNITY): Payer: Self-pay | Admitting: Family Medicine

## 2021-08-29 ENCOUNTER — Ambulatory Visit (HOSPITAL_COMMUNITY)
Admission: RE | Admit: 2021-08-29 | Discharge: 2021-08-29 | Disposition: A | Discharge status: Home / Self Care | Payer: Medicare HMO | Source: Ambulatory Visit | Attending: Family Medicine | Admitting: Family Medicine

## 2021-08-29 ENCOUNTER — Other Ambulatory Visit: Payer: Self-pay

## 2021-08-29 DIAGNOSIS — R059 Cough, unspecified: Secondary | ICD-10-CM

## 2021-09-06 ENCOUNTER — Ambulatory Visit (INDEPENDENT_AMBULATORY_CARE_PROVIDER_SITE_OTHER): Payer: Medicare HMO | Admitting: Urology

## 2021-09-06 ENCOUNTER — Encounter: Payer: Self-pay | Admitting: Urology

## 2021-09-06 VITALS — BP 182/76 | HR 47

## 2021-09-06 DIAGNOSIS — R351 Nocturia: Secondary | ICD-10-CM

## 2021-09-06 DIAGNOSIS — N138 Other obstructive and reflux uropathy: Secondary | ICD-10-CM | POA: Diagnosis not present

## 2021-09-06 DIAGNOSIS — N401 Enlarged prostate with lower urinary tract symptoms: Secondary | ICD-10-CM

## 2021-09-06 DIAGNOSIS — R350 Frequency of micturition: Secondary | ICD-10-CM

## 2021-09-06 LAB — URINALYSIS, ROUTINE W REFLEX MICROSCOPIC
Bilirubin, UA: NEGATIVE
Glucose, UA: NEGATIVE
Ketones, UA: NEGATIVE
Leukocytes,UA: NEGATIVE
Nitrite, UA: NEGATIVE
Protein,UA: NEGATIVE
RBC, UA: NEGATIVE
Specific Gravity, UA: 1.02 (ref 1.005–1.030)
Urobilinogen, Ur: 0.2 mg/dL (ref 0.2–1.0)
pH, UA: 6 (ref 5.0–7.5)

## 2021-09-06 MED ORDER — FINASTERIDE 5 MG PO TABS: 5.0000 mg | ORAL_TABLET | Freq: Every day | ORAL | 3 refills | Status: DC

## 2021-09-06 MED ORDER — SILODOSIN 8 MG PO CAPS: 8.0000 mg | ORAL_CAPSULE | Freq: Every day | ORAL | 3 refills | Status: DC

## 2021-09-06 MED ORDER — MIRABEGRON ER 25 MG PO TB24: 25.0000 mg | ORAL_TABLET | Freq: Every day | ORAL | 0 refills | Status: DC

## 2021-09-06 NOTE — Progress Notes (Signed)
post void residual=39 

## 2021-09-06 NOTE — Progress Notes (Signed)
? ?09/06/2021 ?9:20 AM  ? ?Angel Dixon ?[birth year] ?[id number] ? ?Referring provider: Lemmie Evens, MD ?[address] ?[place],  [place] [address] ? ?Followup BPH and nocturia ? ? 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 He has nocturia 2x which bothers him. The volume is small. NO urinary frequency during the day.  Urine stream strong. No urinary hesitancy. No straining to urinate. He remains on rapaflo '8mg'$  and finasteride. He does not drinks any fluids within 2 hours of going to bed. He has very rare urgency and very rare urge incontinence. He drinks 6-7 bottles of water daily. No other complaints today ? ? ?PMH: ?Past Medical History:  ?Diagnosis Date  ? Angina   ? CAD (coronary artery disease)   ? moderate, by cath  ? Diabetes mellitus   ? DJD (degenerative joint disease)   ? lumbar lam 4/05  ? Dyslipidemia   ? Dysrhythmia   ? "palpitation", PVCs  ? GERD (gastroesophageal reflux disease)   ? Hypertension   ? PUD (peptic ulcer disease)   ? endo 03/15/10  ? ? ?Surgical History: ?Past Surgical History:  ?Procedure Laterality Date  ? BACK SURGERY  2005  ? CARDIAC CATHETERIZATION  02/22/2006  ? mild obs. CAD 60-70% narrowing in small inferior branch of 1st diagonal of LAD, smooth 10% narrowing of mid RCA (Dr. Corky Downs)  ? CARDIAC CATHETERIZATION  04/27/2008  ? 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 Marella Chimes)  ? CARDIAC CATHETERIZATION  05/11/2011  ? nonobstructive CAD (Dr. Roni Bread)  ? COLONOSCOPY N/A 06/13/2016  ? Procedure: COLONOSCOPY;  Surgeon: Rogene Houston, MD;  [facility];  Service: Endoscopy;  Laterality: N/A;  1200  ? HERNIA REPAIR    ? [place]  ? right  ? LEFT HEART CATHETERIZATION WITH CORONARY ANGIOGRAM N/A 05/14/2011  ? Procedure: LEFT HEART CATHETERIZATION WITH CORONARY ANGIOGRAM;  Surgeon: [name], MD;  Location: [facility] CATH LAB;  Service: Cardiovascular;   Laterality: N/A;  Coronary angiogram and possible PCI  ? LUMBAR LAMINECTOMY  09/2003  ? L2-3, 3-4, 4-5  ? NM MYOCAR PERF WALL MOTION  10/03/2009  ? bruce myoview - normal perfusion in all regions, EF 70%, no ischemia, low risk  ? TRANSTHORACIC ECHOCARDIOGRAM  03/28/2012  ? EF 01-02%, grade 1 diastolic dysfunction, mod AV calcification, mild MR  ? ? ?Home Medications:  ?Allergies as of 09/06/2021   ? ?   Reactions  ? Lisinopril Other (See Comments)  ? Sore throat, no obvious angioedema or cough  ? Sulfa Antibiotics Hives  ? Sulfacetamide Hives  ? Vioxx [rofecoxib] Hives  ? Yellow Jacket Venom [bee Venom] Swelling  ? ?  ? ?  ?Medication List  ?  ? ?  ? Accurate as of September 06, 2021  9:20 AM. If you have any questions, ask your nurse or doctor.  ?  ?  ? ?  ? ?amiodarone 200 MG tablet ?Commonly known as: PACERONE ?Take 1 tablet (200 mg total) by mouth daily. ?  ?aspirin EC 81 MG tablet ?Take 81 mg by mouth daily. Swallow whole. ?  ?atorvastatin 40 MG tablet ?Commonly known as: LIPITOR ?Take 40 mg by mouth daily. ?  ?BLINK TEARS OP ?Place 1 drop into both eyes 2 (two) times daily. ?  ?cetirizine 10 MG tablet ?Commonly known as: ZYRTEC ?Take 10 mg by mouth daily as needed. ?  ?finasteride  5 MG tablet ?Commonly known as: PROSCAR ?Take 1 tablet (5 mg total) by mouth daily. ?  ?fluticasone 50 MCG/ACT nasal spray ?Commonly known as: FLONASE ?Place 1 spray into both nostrils as needed for allergies or rhinitis. ?  ?glipiZIDE 5 MG tablet ?Commonly known as: GLUCOTROL ?Take by mouth 2 (two) times daily. ?  ?isosorbide mononitrate 30 MG 24 hr tablet ?Commonly known as: IMDUR ?Take 0.5 tablets (15 mg total) by mouth daily. ?  ?meclizine 25 MG tablet ?Commonly known as: ANTIVERT ?Take 25 mg by mouth as needed for dizziness. ?  ?metoprolol tartrate 25 MG tablet ?Commonly known as: LOPRESSOR ?Take 1 tablet (25 mg total) by mouth 2 (two) times daily. ?  ?nitroGLYCERIN 0.4 MG SL tablet ?Commonly known as: NITROSTAT ?Place 1 tablet (0.4 mg  total) under the tongue every 5 (five) minutes as needed for chest pain (severe chest pain or pressure only). ?  ?OneTouch Ultra test strip ?Generic drug: glucose blood ?USE 1 STRIP TO CHECK BLOOD GLUCOSE LEVEL TWICE DAILY. ?  ?pantoprazole 40 MG tablet ?Commonly known as: PROTONIX ?Take 1 tablet (40 mg total) by mouth 2 (two) times daily. ?  ?silodosin 8 MG Caps capsule ?Commonly known as: RAPAFLO ?Take 1 capsule (8 mg total) by mouth at bedtime. ?  ?sitaGLIPtin 100 MG tablet ?Commonly known as: JANUVIA ?Take 100 mg by mouth daily. ?  ?VITAMIN C PO ?Take 1 tablet by mouth daily. ?  ? ?  ? ? ?Allergies:  ?Allergies  ?Allergen Reactions  ? Lisinopril Other (See Comments)  ?  Sore throat, no obvious angioedema or cough  ? Sulfa Antibiotics Hives  ? Sulfacetamide Hives  ? Vioxx [Rofecoxib] Hives  ? Yellow Jacket Venom [Bee Venom] Swelling  ? ? ?Family History: ?Family History  ?Problem Relation Age of Onset  ? Kidney failure Mother   ?     died 55  ? Coronary artery disease Father   ?     died 65  ? Heart attack Sister   ?     died of MI in her 36s  ? Sarcoidosis Daughter   ?     died 79  ? ? ?Social History:  reports that he has quit smoking. His smoking use included cigarettes. He has a 25.00 pack-year smoking history. He has never used smokeless tobacco. He reports current alcohol use. He reports that he does not use drugs. ? ?ROS: ?All other review of systems were reviewed and are negative except what is noted above in HPI ? ?Physical Exam: ?BP (!) 182/76   Pulse (!) 47   ?Constitutional:  Alert and oriented, No acute distress. ?HEENT: [place] AT, moist mucus membranes.  Trachea midline, no masses. ?Cardiovascular: No clubbing, cyanosis, or edema. ?Respiratory: Normal respiratory effort, no increased work of breathing. ?GI: Abdomen is soft, nontender, nondistended, no abdominal masses ?GU: No CVA tenderness.  ?Lymph: No cervical or inguinal lymphadenopathy. ?Skin: No rashes, bruises or suspicious lesions. ?Neurologic:  Grossly intact, no focal deficits, moving all 4 extremities. ?Psychiatric: Normal mood and affect. ? ?Laboratory Data: ?Lab Results  ?Component Value Date  ? WBC 5.1 10/26/2020  ? HGB 11.3 (L) 10/26/2020  ? HCT 34.2 (L) 10/26/2020  ? MCV 87.5 10/26/2020  ? PLT 268 10/26/2020  ? ? ?Lab Results  ?Component Value Date  ? CREATININE 1.07 10/26/2020  ? ? ?No results found for: PSA ? ?No results found for: TESTOSTERONE ? ?Lab Results  ?Component Value Date  ? HGBA1C 7.4 (H) 03/28/2012  ? ? ?  Urinalysis ?   ?Component Value Date/Time  ? COLORURINE YELLOW 11/18/2016 0152  ? APPEARANCEUR Clear 03/08/2021 1559  ? LABSPEC 1.016 11/18/2016 0152  ? PHURINE 5.0 11/18/2016 0152  ? GLUCOSEU Negative 03/08/2021 1559  ? [place] NEGATIVE 11/18/2016 0152  ? BILIRUBINUR Negative 03/08/2021 1559  ? [place] NEGATIVE 11/18/2016 0152  ? PROTEINUR Negative 03/08/2021 1559  ? [place] NEGATIVE 11/18/2016 0152  ? UROBILINOGEN 0.2 12/20/2014 1314  ? NITRITE Negative 03/08/2021 1559  ? NITRITE NEGATIVE 11/18/2016 0152  ? LEUKOCYTESUR Negative 03/08/2021 1559  ? ? ?Lab Results  ?Component Value Date  ? LABMICR Comment 03/08/2021  ? ? ?Pertinent Imaging: ? ?Results for orders placed during the hospital encounter of 09/04/03 ? ?DG Abd 1 View ? ?Narrative ?Clinical Data:    Postop from lumbar spine surgery.  Abdominal distention and pain. ?ABDOMEN, ONE VIEW ?Diffuse gaseous distention of colon is seen as well as small bowel loops and with gas noted to the level of the rectosigmoid region.  This is consistent with a postoperative ileus.  Midline abdominal skin staples are seen. ?IMPRESSION ?Ileus pattern. ? ?Provider: Otis Dials ? ?No results found for this or any previous visit. ? ?No results found for this or any previous visit. ? ?No results found for this or any previous visit. ? ?Results for orders placed during the hospital encounter of 06/19/16 ? ?US Renal ? ?Narrative ?CLINICAL DATA:  BILATERAL flank pain, history hypertension,  diabetes ?mellitus, GERD, coronary artery disease ? ?EXAM: ?RENAL / URINARY TRACT ULTRASOUND COMPLETE ? ?COMPARISON:  CT abdomen and pelvis 02/20/2007 ? ?FINDINGS: ?Right Kidney: ? ?Length: 11.2 cm. Normal cortical thickness

## 2021-09-06 NOTE — Patient Instructions (Signed)

## 2021-09-11 ENCOUNTER — Telehealth: Payer: Self-pay

## 2021-09-11 NOTE — Telephone Encounter (Signed)
**Note De-Identified Angel Dixon Obfuscation** Letter received from [facility] stating that thay have denied a Amiodarone tier ex. ?Reason: Amiodarone is a generic medication already at the lowest tier per Medicare part D. ?There are no similar medications to treat your condition in a lower tier.  ?The letter state that they have notified the pt of this denial as well.  ?

## 2021-11-01 ENCOUNTER — Telehealth: Payer: Self-pay

## 2021-11-01 NOTE — Telephone Encounter (Signed)
Refill [facility] on [address]

## 2021-11-06 MED ORDER — MIRABEGRON ER 25 MG PO TB24: 25.0000 mg | ORAL_TABLET | Freq: Every day | ORAL | 3 refills | Status: DC

## 2021-11-06 NOTE — Addendum Note (Signed)
Addended by: Dorisann Frames on: 11/06/2021 03:10 PM   Modules accepted: Orders

## 2021-11-06 NOTE — Telephone Encounter (Signed)
Refill submitted. 

## 2021-11-06 NOTE — Telephone Encounter (Signed)
I saw this sitting in my Que. Can you please reroute this to someone? Thank you

## 2021-12-19 ENCOUNTER — Encounter (INDEPENDENT_AMBULATORY_CARE_PROVIDER_SITE_OTHER): Payer: Self-pay | Admitting: *Deleted

## 2022-03-07 ENCOUNTER — Ambulatory Visit: Payer: Medicare HMO | Admitting: Urology

## 2022-03-07 VITALS — BP 145/83 | HR 98

## 2022-03-07 DIAGNOSIS — R350 Frequency of micturition: Secondary | ICD-10-CM | POA: Diagnosis not present

## 2022-03-07 DIAGNOSIS — N138 Other obstructive and reflux uropathy: Secondary | ICD-10-CM

## 2022-03-07 DIAGNOSIS — R351 Nocturia: Secondary | ICD-10-CM

## 2022-03-07 DIAGNOSIS — N401 Enlarged prostate with lower urinary tract symptoms: Secondary | ICD-10-CM | POA: Diagnosis not present

## 2022-03-07 LAB — URINALYSIS, ROUTINE W REFLEX MICROSCOPIC
Bilirubin, UA: NEGATIVE
Ketones, UA: NEGATIVE
Leukocytes,UA: NEGATIVE
Nitrite, UA: NEGATIVE
Protein,UA: NEGATIVE
RBC, UA: NEGATIVE
Specific Gravity, UA: 1.005 (ref 1.005–1.030)
Urobilinogen, Ur: 0.2 mg/dL (ref 0.2–1.0)
pH, UA: 5 (ref 5.0–7.5)

## 2022-03-07 MED ORDER — SILODOSIN 8 MG PO CAPS: 8.0000 mg | ORAL_CAPSULE | Freq: Every day | ORAL | 3 refills | Status: DC

## 2022-03-07 MED ORDER — MIRABEGRON ER 25 MG PO TB24: 25.0000 mg | ORAL_TABLET | Freq: Every day | ORAL | 11 refills | Status: DC

## 2022-03-07 MED ORDER — FINASTERIDE 5 MG PO TABS: 5.0000 mg | ORAL_TABLET | Freq: Every day | ORAL | 3 refills | Status: DC

## 2022-03-07 NOTE — Progress Notes (Signed)
post void residual=30 

## 2022-03-07 NOTE — Progress Notes (Signed)
03/07/2022 9:59 AM   Geofrey Ronnie Doss 09/11/1940 [id number]  Referring provider: Lemmie Evens, MD [place],  [place] [id number]  Followup BPH   HPI: Mr Coonradt is a 81yo here for followup for BPH with urinary frequency and nocturia. Nocturia decreased to 0-2x on mirabegron '25mg'$  daily. Urine stream strong. IPSS 7 QOL 1. NO straining to urinate. Urinary frequency every 4-5 hours. He is on rapaflo '8mg'$  and finasteride '5mg'$  daily. No other complaints today.   PMH: Past Medical History:  Diagnosis Date   Angina    CAD (coronary artery disease)    moderate, by cath   Diabetes mellitus    DJD (degenerative joint disease)    lumbar lam 4/05   Dyslipidemia    Dysrhythmia    "palpitation", PVCs   GERD (gastroesophageal reflux disease)    Hypertension    PUD (peptic ulcer disease)    endo 03/15/10    Surgical History: Past Surgical History:  Procedure Laterality Date   BACK SURGERY  2005   CARDIAC CATHETERIZATION  02/22/2006   mild obs. CAD 60-70% narrowing in small inferior branch of 1st diagonal of LAD, smooth 10% narrowing of mid RCA (Dr. 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 Marella Chimes)   CARDIAC CATHETERIZATION  05/11/2011   nonobstructive CAD (Dr. Roni Bread)   COLONOSCOPY N/A 06/13/2016   Procedure: COLONOSCOPY;  Surgeon: Rogene Houston, MD;  [facility];  Service: Endoscopy;  Laterality: N/A;  [place]   right   LEFT HEART CATHETERIZATION WITH CORONARY ANGIOGRAM N/A 05/14/2011   Procedure: LEFT HEART CATHETERIZATION WITH CORONARY ANGIOGRAM;  Surgeon: [name], MD;  Location: [facility] CATH LAB;  Service: Cardiovascular;  Laterality: N/A;  Coronary angiogram and possible PCI   LUMBAR LAMINECTOMY  09/2003   L2-3, 3-4, 4-5   NM MYOCAR PERF WALL MOTION  10/03/2009   bruce myoview - normal perfusion in all  regions, EF 70%, no ischemia, low risk   TRANSTHORACIC ECHOCARDIOGRAM  03/28/2012   EF 85-27%, grade 1 diastolic dysfunction, mod AV calcification, mild MR    Home Medications:  Allergies as of 03/07/2022       Reactions   Lisinopril Other (See Comments)   Sore throat, no obvious angioedema or cough   Sulfa Antibiotics Hives   Sulfacetamide Hives   Vioxx [rofecoxib] Hives   Yellow Jacket Venom [bee Venom] Swelling        Medication List        Accurate as of March 07, 2022  9:59 AM. If you have any questions, ask your nurse or doctor.          amiodarone 200 MG tablet Commonly known as: PACERONE Take 1 tablet (200 mg total) by mouth daily.   aspirin EC 81 MG tablet Take 81 mg by mouth daily. Swallow whole.   atorvastatin 40 MG tablet Commonly known as: LIPITOR Take 40 mg by mouth daily.   BLINK TEARS OP Place 1 drop into both eyes 2 (two) times daily.   cetirizine 10 MG tablet Commonly known as: ZYRTEC Take 10 mg by mouth daily as needed.   finasteride 5 MG tablet Commonly known as: PROSCAR Take 1 tablet (5 mg total) by mouth daily.   fluticasone 50 MCG/ACT nasal spray Commonly known as: FLONASE Place 1 spray into both nostrils as  needed for allergies or rhinitis.   glipiZIDE 5 MG tablet Commonly known as: GLUCOTROL Take by mouth 2 (two) times daily.   isosorbide mononitrate 30 MG 24 hr tablet Commonly known as: IMDUR Take 0.5 tablets (15 mg total) by mouth daily.   meclizine 25 MG tablet Commonly known as: ANTIVERT Take 25 mg by mouth as needed for dizziness.   metoprolol tartrate 25 MG tablet Commonly known as: LOPRESSOR Take 1 tablet (25 mg total) by mouth 2 (two) times daily.   mirabegron ER 25 MG Tb24 tablet Commonly known as: MYRBETRIQ Take 1 tablet (25 mg total) by mouth daily.   nitroGLYCERIN 0.4 MG SL tablet Commonly known as: NITROSTAT Place 1 tablet (0.4 mg total) under the tongue every 5 (five) minutes as needed for chest pain  (severe chest pain or pressure only).   OneTouch Ultra test strip Generic drug: glucose blood USE 1 STRIP TO CHECK BLOOD GLUCOSE LEVEL TWICE DAILY.   pantoprazole 40 MG tablet Commonly known as: PROTONIX Take 1 tablet (40 mg total) by mouth 2 (two) times daily.   silodosin 8 MG Caps capsule Commonly known as: RAPAFLO Take 1 capsule (8 mg total) by mouth at bedtime.   sitaGLIPtin 100 MG tablet Commonly known as: JANUVIA Take 100 mg by mouth daily.   VITAMIN C PO Take 1 tablet by mouth daily.        Allergies:  Allergies  Allergen Reactions   Lisinopril Other (See Comments)    Sore throat, no obvious angioedema or cough   Sulfa Antibiotics Hives   Sulfacetamide Hives   Vioxx [Rofecoxib] Hives   Yellow Jacket Venom [Bee Venom] Swelling    Family History: Family History  Problem Relation Age of Onset   Kidney failure Mother        died 33   Coronary artery disease Father        died 53   Heart attack Sister        died of MI in her 64s   Sarcoidosis Daughter        died 52    Social History:  reports that he has quit smoking. His smoking use included cigarettes. He has a 25.00 pack-year smoking history. He has never used smokeless tobacco. He reports current alcohol use. He reports that he does not use drugs.  ROS: All other review of systems were reviewed and are negative except what is noted above in HPI  Physical Exam: BP (!) 145/83   Pulse 98   Constitutional:  Alert and oriented, No acute distress. HEENT: [place] AT, moist mucus membranes.  Trachea midline, no masses. Cardiovascular: No clubbing, cyanosis, or edema. Respiratory: Normal respiratory effort, no increased work of breathing. GI: Abdomen is soft, nontender, nondistended, no abdominal masses GU: No CVA tenderness.  Lymph: No cervical or inguinal lymphadenopathy. Skin: No rashes, bruises or suspicious lesions. Neurologic: Grossly intact, no focal deficits, moving all 4 extremities. Psychiatric:  Normal mood and affect.  Laboratory Data: Lab Results  Component Value Date   WBC 5.1 10/26/2020   HGB 11.3 (L) 10/26/2020   HCT 34.2 (L) 10/26/2020   MCV 87.5 10/26/2020   PLT 268 10/26/2020    Lab Results  Component Value Date   CREATININE 1.07 10/26/2020    No results found for: "PSA"  No results found for: "TESTOSTERONE"  Lab Results  Component Value Date   HGBA1C 7.4 (H) 03/28/2012    Urinalysis    Component Value Date/Time   COLORURINE YELLOW 11/18/2016  [place] 09/06/2021 0858   LABSPEC 1.016 11/18/2016 0152   PHURINE 5.0 11/18/2016 0152   GLUCOSEU Negative 09/06/2021 0858   HGBUR NEGATIVE 11/18/2016 0152   BILIRUBINUR Negative 09/06/2021 [place] 11/18/2016 0152   PROTEINUR Negative 09/06/2021 0858   PROTEINUR NEGATIVE 11/18/2016 0152   UROBILINOGEN 0.2 12/20/2014 1314   NITRITE Negative 09/06/2021 0858   NITRITE NEGATIVE 11/18/2016 0152   LEUKOCYTESUR Negative 09/06/2021 0858    Lab Results  Component Value Date   LABMICR Comment 09/06/2021    Pertinent Imaging:  Results for orders placed during the hospital encounter of 09/04/03  DG Abd 1 View  Narrative Clinical Data:    Postop from lumbar spine surgery.  Abdominal distention and pain. ABDOMEN, ONE VIEW Diffuse gaseous distention of colon is seen as well as small bowel loops and with gas noted to the level of the rectosigmoid region.  This is consistent with a postoperative ileus.  Midline abdominal skin staples are seen. IMPRESSION Ileus pattern.  Provider: Otis Dials  No results found for this or any previous visit.  No results found for this or any previous visit.  No results found for this or any previous visit.  Results for orders placed during the hospital encounter of 06/19/16  US Renal  Narrative CLINICAL DATA:  BILATERAL flank pain, history hypertension, diabetes mellitus, GERD, coronary artery disease  EXAM: RENAL / URINARY TRACT  ULTRASOUND COMPLETE  COMPARISON:  CT abdomen and pelvis 02/20/2007  FINDINGS: Right Kidney:  Length: 11.2 cm. Normal cortical thickness with upper normal cortical echogenicity. Large bilobed central RIGHT renal cyst 6.5 x 3.8 x 3.0 cm, increased in size since previous exam. Slightly irregular septations at the central portion complicate the lesion. No additional mass, hydronephrosis or shadowing calcification.  Left Kidney:  Length: 11.9 cm. Normal cortical thickness. Upper normal cortical echogenicity. No mass, hydronephrosis, or shadowing calcification  Bladder:  Normal appearance. Enlarged prostate gland 6.8 x 5.2 x 5.9 cm noted.  IMPRESSION: Interval increase in size of a bilobed cyst at the mid RIGHT kidney, demonstrating slightly irregular and thick septation at the central portion ; further characterization of this lesion by MR imaging with and without contrast recommended to exclude cystic renal neoplasm.  Significant prostatic enlargement.   Electronically Signed By: Lavonia Dana M.D. On: 06/19/2016 15:11  No valid procedures specified. No results found for this or any previous visit.  No results found for this or any previous visit.   Assessment & Plan:    1. Benign prostatic hyperplasia with urinary obstruction Continue rapaflo '8mg'$  and finasterid '5mg'$  - BLADDER SCAN AMB NON-IMAGING - Urinalysis, Routine w reflex microscopic  2. Urinary frequency Continue mirabegreon '25mg'$   3. Nocturia Cotinue rapaflo '8mg'$  and finasteride '5mg'$    No follow-ups on file.  Nicolette Bang, MD  [facility] [place]

## 2022-03-09 ENCOUNTER — Ambulatory Visit: Payer: Medicare HMO | Admitting: Urology

## 2022-03-12 ENCOUNTER — Ambulatory Visit (INDEPENDENT_AMBULATORY_CARE_PROVIDER_SITE_OTHER): Payer: Medicare HMO | Admitting: Gastroenterology

## 2022-03-12 ENCOUNTER — Encounter (INDEPENDENT_AMBULATORY_CARE_PROVIDER_SITE_OTHER): Payer: Self-pay | Admitting: Gastroenterology

## 2022-03-12 VITALS — BP 152/75 | HR 92 | Temp 98.1°F | Ht 68.0 in | Wt 145.2 lb

## 2022-03-12 DIAGNOSIS — K219 Gastro-esophageal reflux disease without esophagitis: Secondary | ICD-10-CM | POA: Diagnosis not present

## 2022-03-12 DIAGNOSIS — R1319 Other dysphagia: Secondary | ICD-10-CM

## 2022-03-12 DIAGNOSIS — Z86010 Personal history of colonic polyps: Secondary | ICD-10-CM | POA: Diagnosis not present

## 2022-03-12 DIAGNOSIS — R1310 Dysphagia, unspecified: Secondary | ICD-10-CM | POA: Insufficient documentation

## 2022-03-12 NOTE — Patient Instructions (Signed)
Schedule EGD and colonoscopy Continue pantoprazole 40 mg qday

## 2022-03-12 NOTE — Progress Notes (Signed)
[name], M.D. Gastroenterology & Hepatology [facility] [address] [facility] [employer] [facility] [place], [place] [address] Primary Care Physician: Lemmie Evens, MD [place] [id number]  Referring MD: PCP  Chief Complaint:  dysphagia  History of Present Illness: Angel Dixon is a 81 y.o. male with PMH DM, CAD, HTN, GERD, HLD, who presents for evaluation of dysphagia.  Patient reports that he has been presenting intermittent episodes of dysphagia and sensation of "throat closing" when swallowing for the last year. Reports this usually happens with specific solid food - meat or cornbread. He states that he was previously taking Januvia for DM and it was causing sore throat, but this improved after he stopped the medicine. Has not presented more odynophagia. States that he has to clear his thorat frequently, but no heartburn. He was taking pantoprazole 40 mg twice a day, but noticed that once a day works well so he has changed to this schedule.  Believes his stools have been intermittently with melena for the last 4 months.   The patient denies having any nausea, vomiting, fever, chills, hematochezia, melena, hematemesis, abdominal distention, abdominal pain, diarrhea, jaundice, pruritus. Has lost a few lb as he has somewhat changed his diet.  Last TKZ:SWFUXNAT 15 years ago, no report available Last Colonoscopy:2018 - Melanosis in the colon. - One 8 mm polyp in the ascending colon, removed with a hot snare. Resected and retrieved. Clip (MR conditional) was placed. - Diverticulosis in the sigmoid colon. - External hemorrhoids.  Colon, polyp(s), ascending - TUBULAR ADENOMA(S). 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  Repeat colonoscopy in  5 years  FHx: neg for any gastrointestinal/liver disease, father had cancer unknown primary Social: former smoking but quit 40 years ago, alcohol or illicit drug use Surgical: inguinal  hernia  Past Medical History: Past Medical History:  Diagnosis Date   Angina    CAD (coronary artery disease)    moderate, by cath   Diabetes mellitus    DJD (degenerative joint disease)    lumbar lam 4/05   Dyslipidemia    Dysrhythmia    "palpitation", PVCs   GERD (gastroesophageal reflux disease)    Hypertension    PUD (peptic ulcer disease)    endo 03/15/10    Past Surgical History: Past Surgical History:  Procedure Laterality Date   BACK SURGERY  2005   CARDIAC CATHETERIZATION  02/22/2006   mild obs. CAD 60-70% narrowing in small inferior branch of 1st diagonal of LAD, smooth 10% narrowing of mid RCA (Dr. 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 Marella Chimes)   CARDIAC CATHETERIZATION  05/11/2011   nonobstructive CAD (Dr. Roni Bread)   COLONOSCOPY N/A 06/13/2016   Procedure: COLONOSCOPY;  Surgeon: Rogene Houston, MD;  Location: [facility];  Service: Endoscopy;  Laterality: N/A;  Portal   right   LEFT HEART CATHETERIZATION WITH CORONARY ANGIOGRAM N/A 05/14/2011   Procedure: LEFT HEART CATHETERIZATION WITH CORONARY ANGIOGRAM;  Surgeon: [name], MD;  [facility];  Service: Cardiovascular;  Laterality: N/A;  Coronary angiogram and possible PCI   LUMBAR LAMINECTOMY  09/2003   L2-3, 3-4, 4-5   NM MYOCAR PERF WALL MOTION  10/03/2009   bruce myoview - normal perfusion in all regions, EF 70%, no ischemia, low risk   TRANSTHORACIC ECHOCARDIOGRAM  03/28/2012  EF 82-80%, grade 1 diastolic dysfunction, mod AV calcification, mild MR    Family History: Family History  Problem Relation Age of Onset   Kidney failure Mother        died 72   Coronary artery disease Father        died 78   Heart attack Sister        died of MI in her 36s   Sarcoidosis Daughter        died 66    Social History: Social History   Tobacco Use   Smoking Status Former   Packs/day: 1.00   Years: 25.00   Total pack years: 25.00   Types: Cigarettes  Smokeless Tobacco Never  Tobacco Comments   quit smoking cigarettes ~ 1990   Social History   Substance and Sexual Activity  Alcohol Use Yes   Comment: 05/11/11 "shot every now and then"   Social History   Substance and Sexual Activity  Drug Use No    Allergies: Allergies  Allergen Reactions   Lisinopril Other (See Comments)    Sore throat, no obvious angioedema or cough   Sulfa Antibiotics Hives   Sulfacetamide Hives   Vioxx [Rofecoxib] Hives   Yellow Jacket Venom [Bee Venom] Swelling    Medications: Current Outpatient Medications  Medication Sig Dispense Refill   amiodarone (PACERONE) 200 MG tablet Take 1 tablet (200 mg total) by mouth daily. 90 tablet 3   Ascorbic Acid (VITAMIN C PO) Take 1 tablet by mouth daily.     aspirin EC 81 MG tablet Take 81 mg by mouth daily. Swallow whole.     atorvastatin (LIPITOR) 40 MG tablet Take 40 mg by mouth daily.     cetirizine (ZYRTEC) 10 MG tablet Take 10 mg by mouth daily as needed.     dapagliflozin propanediol (FARXIGA) 5 MG TABS tablet Take 5 mg by mouth daily.     finasteride (PROSCAR) 5 MG tablet Take 1 tablet (5 mg total) by mouth daily. 90 tablet 3   fluticasone (FLONASE) 50 MCG/ACT nasal spray Place 1 spray into both nostrils as needed for allergies or rhinitis.   11   glipiZIDE (GLUCOTROL) 5 MG tablet Take by mouth 2 (two) times daily.     isosorbide mononitrate (IMDUR) 30 MG 24 hr tablet Take 0.5 tablets (15 mg total) by mouth daily. 30 tablet 1   meclizine (ANTIVERT) 25 MG tablet Take 25 mg by mouth as needed for dizziness.     metoprolol tartrate (LOPRESSOR) 25 MG tablet Take 1 tablet (25 mg total) by mouth 2 (two) times daily. 180 tablet 3   mirabegron ER (MYRBETRIQ) 25 MG TB24 tablet Take 1 tablet (25 mg total) by mouth daily. 30 tablet 11   nitroGLYCERIN (NITROSTAT) 0.4 MG SL tablet Place 1 tablet (0.4 mg total)  under the tongue every 5 (five) minutes as needed for chest pain (severe chest pain or pressure only). 25 tablet 3   ONETOUCH ULTRA test strip USE 1 STRIP TO CHECK BLOOD GLUCOSE LEVEL TWICE DAILY.     pantoprazole (PROTONIX) 40 MG tablet Take 1 tablet (40 mg total) by mouth 2 (two) times daily. 60 tablet 2   Polyethylene Glycol 400 (BLINK TEARS OP) Place 1 drop into both eyes 2 (two) times daily.     silodosin (RAPAFLO) 8 MG CAPS capsule Take 1 capsule (8 mg total) by mouth at bedtime. 90 capsule 3   sitaGLIPtin (JANUVIA) 100 MG tablet Take 100 mg by mouth daily.  No current facility-administered medications for this visit.    Review of Systems: GENERAL: negative for malaise, night sweats HEENT: No changes in hearing or vision, no nose bleeds or other nasal problems. NECK: Negative for lumps, goiter, pain and significant neck swelling RESPIRATORY: Negative for cough, wheezing CARDIOVASCULAR: Negative for chest pain, leg swelling, palpitations, orthopnea GI: SEE HPI MUSCULOSKELETAL: Negative for joint pain or swelling, back pain, and muscle pain. SKIN: Negative for lesions, rash PSYCH: Negative for sleep disturbance, mood disorder and recent psychosocial stressors. HEMATOLOGY Negative for prolonged bleeding, bruising easily, and swollen nodes. ENDOCRINE: Negative for cold or heat intolerance, polyuria, polydipsia and goiter. NEURO: negative for tremor, gait imbalance, syncope and seizures. The remainder of the review of systems is noncontributory.   Physical Exam: BP (!) 152/75 (BP Location: Left Arm, Patient Position: Sitting, Cuff Size: Small)   Pulse 92   Temp 98.1 F (36.7 C) (Oral)   Ht '5\' 8"'$  (1.727 m)   Wt 145 lb 3.2 oz (65.9 kg)   BMI 22.08 kg/m  GENERAL: The patient is AO x3, in no acute distress. HEENT: Head is normocephalic and atraumatic. EOMI are intact. Mouth is well hydrated and without lesions. NECK: Supple. No masses LUNGS: Clear to auscultation. No presence of  rhonchi/wheezing/rales. Adequate chest expansion HEART: RRR, normal s1 and s2. ABDOMEN: Soft, nontender, no guarding, no peritoneal signs, and nondistended. BS +. No masses. EXTREMITIES: Without any cyanosis, clubbing, rash, lesions or edema. NEUROLOGIC: AOx3, no focal motor deficit. SKIN: no jaundice, no rashes   Imaging/Labs: as above  I personally reviewed and interpreted the available labs, imaging and endoscopic files.  Impression and Plan: Angel Dixon is a 81 y.o. male with PMH DM, CAD, HTN, GERD, HLD, who presents for evaluation of dysphagia.  The patient has presented recurrent episodes of dysphagia to solids without severe red flag signs.  He has not improved with the use of PPI, even up to twice a day dosing.  However, once a day dosing has controlled his GERD symptoms adequately.  We will continue with this dosage but we will explore his dysphagia symptoms further with an EGD.  He is also due for repeat colonoscopy given his history of colonic polyps which will be scheduled on the same day.  - Schedule EGD and colonoscopy -Continue pantoprazole 40 mg qday  All questions were answered.      [name], MD Gastroenterology and Hepatology [facility]

## 2022-03-13 ENCOUNTER — Telehealth (INDEPENDENT_AMBULATORY_CARE_PROVIDER_SITE_OTHER): Payer: Self-pay

## 2022-03-13 ENCOUNTER — Encounter (INDEPENDENT_AMBULATORY_CARE_PROVIDER_SITE_OTHER): Payer: Self-pay

## 2022-03-13 ENCOUNTER — Other Ambulatory Visit (INDEPENDENT_AMBULATORY_CARE_PROVIDER_SITE_OTHER): Payer: Self-pay

## 2022-03-13 MED ORDER — PEG 3350-KCL-NA BICARB-NACL 420 G PO SOLR: 4000.0000 mL | ORAL | 0 refills | Status: DC

## 2022-03-13 NOTE — Telephone Encounter (Signed)
Arliss Frisina [name] Rielynn Trulson, CMA  ?

## 2022-03-14 ENCOUNTER — Encounter (INDEPENDENT_AMBULATORY_CARE_PROVIDER_SITE_OTHER): Payer: Self-pay

## 2022-03-15 ENCOUNTER — Encounter: Payer: Self-pay | Admitting: Urology

## 2022-03-15 NOTE — Patient Instructions (Signed)

## 2022-05-03 NOTE — Patient Instructions (Signed)
Angel Dixon  05/03/2022     '@PREFPERIOPPHARMACY'$ @   Your procedure is scheduled on  05/08/2022.   Report to [facility] at  [phone]  A.M.   Call this number if you have problems the morning of surgery:  [phone]  If you experience any cold or flu symptoms such as cough, fever, chills, shortness of breath, etc. between now and your scheduled surgery, please notify us at the above number.   Remember:  Follow the diet and prep instructions given to you by the office.      Your last dose of farxiga should be on 05/06/2022.        DO NOT take any medications for diabetes the morning of your procedure.     Take these medicines the morning of surgery with A SIP OF WATER        amiodarone, zyrtec, proscar, isosorbide, antivert(if needed), metoprolol, protonix.     Do not wear jewelry, make-up or nail polish.  Do not wear lotions, powders, or perfumes, or deodorant.  Do not shave 48 hours prior to surgery.  Men may shave face and neck.  Do not bring valuables to the hospital.  [facility] is not responsible for any belongings or valuables.  Contacts, dentures or bridgework may not be worn into surgery.  Leave your suitcase in the car.  After surgery it may be brought to your room.  For patients admitted to the hospital, discharge time will be determined by your treatment team.  Patients discharged the day of surgery will not be allowed to drive home and must have someone with them for 24 hours.    Special instructions:   DO NOT smoke tobacco or vape for 24 hours before your procedure.  Please read over the following fact sheets that you were given. Anesthesia Post-op Instructions and Care and Recovery After Surgery      Upper Endoscopy, Adult, Care After After the procedure, it is common to have a sore throat. It is also common to have: Mild stomach pain or discomfort. Bloating. Nausea. Follow these instructions at home: The instructions below may help you  care for yourself at home. Your health care provider may give you more instructions. If you have questions, ask your health care provider. If you were given a sedative during the procedure, it can affect you for several hours. Do not drive or operate machinery until your health care provider says that it is safe. If you will be going home right after the procedure, plan to have a responsible adult: Take you home from the hospital or clinic. You will not be allowed to drive. Care for you for the time you are told. Follow instructions from your health care provider about what you may eat and drink. Return to your normal activities as told by your health care provider. Ask your health care provider what activities are safe for you. Take over-the-counter and prescription medicines only as told by your health care provider. Contact a health care provider if you: Have a sore throat that lasts longer than one day. Have trouble swallowing. Have a fever. Get help right away if you: Vomit blood or your vomit looks like coffee grounds. Have bloody, black, or tarry stools. Have a very bad sore throat or you cannot swallow. Have difficulty breathing or very bad pain in your chest or abdomen. These symptoms may be an emergency. Get help right away. Call 911. Do not wait to see  if the symptoms will go away. Do not drive yourself to the hospital. Summary After the procedure, it is common to have a sore throat, mild stomach discomfort, bloating, and nausea. If you were given a sedative during the procedure, it can affect you for several hours. Do not drive until your health care provider says that it is safe. Follow instructions from your health care provider about what you may eat and drink. Return to your normal activities as told by your health care provider. This information is not intended to replace advice given to you by your health care provider. Make sure you discuss any questions you have with your  health care provider. Document Revised: 08/30/2021 Document Reviewed: 08/30/2021 Elsevier Patient Education  [place]. Colonoscopy, Adult, Care After The following information offers guidance on how to care for yourself after your procedure. Your health care provider may also give you more specific instructions. If you have problems or questions, contact your health care provider. What can I expect after the procedure? After the procedure, it is common to have: A small amount of blood in your stool for 24 hours after the procedure. Some gas. Mild cramping or bloating of your abdomen. Follow these instructions at home: Eating and drinking  Drink enough fluid to keep your urine pale yellow. Follow instructions from your health care provider about eating or drinking restrictions. Resume your normal diet as told by your health care provider. Avoid heavy or fried foods that are hard to digest. Activity Rest as told by your health care provider. Avoid sitting for a long time without moving. Get up to take short walks every 1-2 hours. This is important to improve blood flow and breathing. Ask for help if you feel weak or unsteady. Return to your normal activities as told by your health care provider. Ask your health care provider what activities are safe for you. Managing cramping and bloating  Try walking around when you have cramps or feel bloated. If directed, apply heat to your abdomen as told by your health care provider. Use the heat source that your health care provider recommends, such as a moist heat pack or a heating pad. Place a towel between your skin and the heat source. Leave the heat on for 20-30 minutes. Remove the heat if your skin turns bright red. This is especially important if you are unable to feel pain, heat, or cold. You have a greater risk of getting burned. General instructions If you were given a sedative during the procedure, it can affect you for several  hours. Do not drive or operate machinery until your health care provider says that it is safe. For the first 24 hours after the procedure: Do not sign important documents. Do not drink alcohol. Do your regular daily activities at a slower pace than normal. Eat soft foods that are easy to digest. Take over-the-counter and prescription medicines only as told by your health care provider. Keep all follow-up visits. This is important. Contact a health care provider if: You have blood in your stool 2-3 days after the procedure. Get help right away if: You have more than a small spotting of blood in your stool. You have large blood clots in your stool. You have swelling of your abdomen. You have nausea or vomiting. You have a fever. You have increasing pain in your abdomen that is not relieved with medicine. These symptoms may be an emergency. Get help right away. Call 911. Do not wait to see  if the symptoms will go away. Do not drive yourself to the hospital. Summary After the procedure, it is common to have a small amount of blood in your stool. You may also have mild cramping and bloating of your abdomen. If you were given a sedative during the procedure, it can affect you for several hours. Do not drive or operate machinery until your health care provider says that it is safe. Get help right away if you have a lot of blood in your stool, nausea or vomiting, a fever, or increased pain in your abdomen. This information is not intended to replace advice given to you by your health care provider. Make sure you discuss any questions you have with your health care provider. Document Revised: 01/11/2021 Document Reviewed: 01/11/2021 Elsevier Patient Education  [place] After The following information offers guidance on how to care for yourself after your procedure. Your health care provider may also give you more specific instructions. If you have  problems or questions, contact your health care provider. What can I expect after the procedure? After the procedure, it is common to have: Tiredness. Little or no memory about what happened during or after the procedure. Impaired judgment when it comes to making decisions. Nausea or vomiting. Some trouble with balance. Follow these instructions at home: For the time period you were told by your health care provider:  Rest. Do not participate in activities where you could fall or become injured. Do not drive or use machinery. Do not drink alcohol. Do not take sleeping pills or medicines that cause drowsiness. Do not make important decisions or sign legal documents. Do not take care of children on your own. Medicines Take over-the-counter and prescription medicines only as told by your health care provider. If you were prescribed antibiotics, take them as told by your health care provider. Do not stop using the antibiotic even if you start to feel better. Eating and drinking Follow instructions from your health care provider about what you may eat and drink. Drink enough fluid to keep your urine pale yellow. If you vomit: Drink clear fluids slowly and in small amounts as you are able. Clear fluids include water, ice chips, low-calorie sports drinks, and fruit juice that has water added to it (diluted fruit juice). Eat light and bland foods in small amounts as you are able. These foods include bananas, applesauce, rice, lean meats, toast, and crackers. General instructions  Have a responsible adult stay with you for the time you are told. It is important to have someone help care for you until you are awake and alert. If you have sleep apnea, surgery and some medicines can increase your risk for breathing problems. Follow instructions from your health care provider about wearing your sleep device: When you are sleeping. This includes during daytime naps. While taking prescription pain  medicines, sleeping medicines, or medicines that make you drowsy. Do not use any products that contain nicotine or tobacco. These products include cigarettes, chewing tobacco, and vaping devices, such as e-cigarettes. If you need help quitting, ask your health care provider. Contact a health care provider if: You feel nauseous or vomit every time you eat or drink. You feel light-headed. You are still sleepy or having trouble with balance after 24 hours. You get a rash. You have a fever. You have redness or swelling around the IV site. Get help right away if: You have trouble breathing. You have new confusion after you get  home. These symptoms may be an emergency. Get help right away. Call 911. Do not wait to see if the symptoms will go away. Do not drive yourself to the hospital. This information is not intended to replace advice given to you by your health care provider. Make sure you discuss any questions you have with your health care provider. Document Revised: 10/16/2021 Document Reviewed: 10/16/2021 Elsevier Patient Education  [place].

## 2022-05-04 ENCOUNTER — Encounter (HOSPITAL_COMMUNITY)
Admission: RE | Admit: 2022-05-04 | Discharge: 2022-05-04 | Disposition: A | Discharge status: Home / Self Care | Payer: Medicare HMO | Source: Ambulatory Visit | Attending: Gastroenterology | Admitting: Gastroenterology

## 2022-05-04 ENCOUNTER — Telehealth: Payer: Self-pay | Admitting: *Deleted

## 2022-05-04 ENCOUNTER — Encounter (HOSPITAL_COMMUNITY): Payer: Self-pay

## 2022-05-04 DIAGNOSIS — E119 Type 2 diabetes mellitus without complications: Secondary | ICD-10-CM

## 2022-05-04 NOTE — Pre-Procedure Instructions (Signed)
Angel Dixon because patient did not show for pre-op today.

## 2022-05-04 NOTE — Telephone Encounter (Signed)
LMOVM to call back 

## 2022-05-04 NOTE — Telephone Encounter (Signed)
-----   Message from Encarnacion Chu, RN sent at 05/04/2022 11:16 AM EST ----- Regarding: no show Good morning! [place] did not show for his preop this morning.

## 2022-05-07 NOTE — Telephone Encounter (Signed)
Called pt, lmovm

## 2022-05-08 ENCOUNTER — Encounter (HOSPITAL_COMMUNITY): Admission: RE | Payer: Self-pay | Source: Home / Self Care

## 2022-05-08 ENCOUNTER — Ambulatory Visit (HOSPITAL_COMMUNITY): Admission: RE | Admit: 2022-05-08 | Payer: Medicare HMO | Source: Home / Self Care | Admitting: Gastroenterology

## 2022-05-08 SURGERY — COLONOSCOPY WITH PROPOFOL
Anesthesia: Monitor Anesthesia Care

## 2022-05-12 ENCOUNTER — Other Ambulatory Visit: Payer: Self-pay | Admitting: Internal Medicine

## 2022-08-01 ENCOUNTER — Other Ambulatory Visit: Payer: Self-pay | Admitting: *Deleted

## 2022-08-01 DIAGNOSIS — K4090 Unilateral inguinal hernia, without obstruction or gangrene, not specified as recurrent: Secondary | ICD-10-CM

## 2022-08-02 ENCOUNTER — Encounter: Payer: Self-pay | Admitting: General Surgery

## 2022-08-02 ENCOUNTER — Ambulatory Visit: Payer: Medicare HMO | Admitting: General Surgery

## 2022-08-02 VITALS — BP 159/88 | HR 83 | Temp 97.9°F | Resp 16 | Ht 68.0 in | Wt 143.0 lb

## 2022-08-02 DIAGNOSIS — K4090 Unilateral inguinal hernia, without obstruction or gangrene, not specified as recurrent: Secondary | ICD-10-CM

## 2022-08-02 DIAGNOSIS — H3530 Unspecified macular degeneration: Secondary | ICD-10-CM

## 2022-08-02 NOTE — Progress Notes (Signed)
Angel Dixon; [id number]; 1940/07/04   HPI Patient is an 82 year old black male who was referred to my care by Dr. Leslie Andrea for evaluation and treatment of a left inguinal hernia.  Patient states he has had the hernia for several years but recently it is increasing in size and causing him discomfort.  It is made worse with straining.  He denies any episode of incarceration.  He denies any nausea or vomiting.  He is status post right inguinal herniorrhaphy in the remote past.  He does have a history of dysrhythmia and he is seeing Dr. Lovena Le of cardiology for this.  It has been stable. Past Medical History:  Diagnosis Date   Angina    CAD (coronary artery disease)    moderate, by cath   Diabetes mellitus    DJD (degenerative joint disease)    lumbar lam 4/05   Dyslipidemia    Dysrhythmia    "palpitation", PVCs   GERD (gastroesophageal reflux disease)    Hypertension    PUD (peptic ulcer disease)    endo 03/15/10    Past Surgical History:  Procedure Laterality Date   BACK SURGERY  2005   CARDIAC CATHETERIZATION  02/22/2006   mild obs. CAD 60-70% narrowing in small inferior branch of 1st diagonal of LAD, smooth 10% narrowing of mid RCA (Dr. 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 Marella Chimes)   CARDIAC CATHETERIZATION  05/11/2011   nonobstructive CAD (Dr. Roni Bread)   COLONOSCOPY N/A 06/13/2016   Procedure: COLONOSCOPY;  Surgeon: Rogene Houston, MD;  [facility];  Service: Endoscopy;  Laterality: N/A;  [place]   right   LEFT HEART CATHETERIZATION WITH CORONARY ANGIOGRAM N/A 05/14/2011   Procedure: LEFT HEART CATHETERIZATION WITH CORONARY ANGIOGRAM;  Surgeon: [name], MD;  Location: [facility] CATH LAB;  Service: Cardiovascular;  Laterality: N/A;  Coronary angiogram and possible PCI   LUMBAR LAMINECTOMY  09/2003   L2-3, 3-4, 4-5    NM MYOCAR PERF WALL MOTION  10/03/2009   bruce myoview - normal perfusion in all regions, EF 70%, no ischemia, low risk   TRANSTHORACIC ECHOCARDIOGRAM  03/28/2012   EF Q000111Q, grade 1 diastolic dysfunction, mod AV calcification, mild MR    Family History  Problem Relation Age of Onset   Kidney failure Mother        died 5   Coronary artery disease Father        died 40   Heart attack Sister        died of MI in her 41s   Sarcoidosis Daughter        died 37    Current Outpatient Medications on File Prior to Visit  Medication Sig Dispense Refill   amiodarone (PACERONE) 200 MG tablet TAKE 1 TABLET BY MOUTH EVERY DAY 90 tablet 3   Ascorbic Acid (VITAMIN C PO) Take 1 tablet by mouth daily.     aspirin EC 81 MG tablet Take 81 mg by mouth daily. Swallow whole.     atorvastatin (LIPITOR) 40 MG tablet Take 40 mg by mouth daily.     cetirizine (ZYRTEC) 10 MG tablet Take 10 mg by mouth daily as needed.     dapagliflozin propanediol (FARXIGA) 5 MG TABS tablet Take 5 mg by mouth daily.     finasteride (PROSCAR) 5 MG tablet Take  1 tablet (5 mg total) by mouth daily. 90 tablet 3   fluticasone (FLONASE) 50 MCG/ACT nasal spray Place 1 spray into both nostrils as needed for allergies or rhinitis.   11   glipiZIDE (GLUCOTROL) 5 MG tablet Take by mouth 2 (two) times daily.     isosorbide mononitrate (IMDUR) 30 MG 24 hr tablet Take 0.5 tablets (15 mg total) by mouth daily. 30 tablet 1   meclizine (ANTIVERT) 25 MG tablet Take 25 mg by mouth as needed for dizziness.     metoprolol tartrate (LOPRESSOR) 25 MG tablet Take 1 tablet (25 mg total) by mouth 2 (two) times daily. 180 tablet 3   mirabegron ER (MYRBETRIQ) 25 MG TB24 tablet Take 1 tablet (25 mg total) by mouth daily. 30 tablet 11   nitroGLYCERIN (NITROSTAT) 0.4 MG SL tablet Place 1 tablet (0.4 mg total) under the tongue every 5 (five) minutes as needed for chest pain (severe chest pain or pressure only). 25 tablet 3   ONETOUCH ULTRA test strip USE  1 STRIP TO CHECK BLOOD GLUCOSE LEVEL TWICE DAILY.     pantoprazole (PROTONIX) 40 MG tablet Take 1 tablet (40 mg total) by mouth 2 (two) times daily. 60 tablet 2   Polyethylene Glycol 400 (BLINK TEARS OP) Place 1 drop into both eyes 2 (two) times daily.     polyethylene glycol-electrolytes (TRILYTE) 420 g solution Take 4,000 mLs by mouth as directed. 4000 mL 0   silodosin (RAPAFLO) 8 MG CAPS capsule Take 1 capsule (8 mg total) by mouth at bedtime. 90 capsule 3   sitaGLIPtin (JANUVIA) 100 MG tablet Take 100 mg by mouth daily.     No current facility-administered medications on file prior to visit.    Allergies  Allergen Reactions   Lisinopril Other (See Comments)    Sore throat, no obvious angioedema or cough   Sulfa Antibiotics Hives   Sulfacetamide Hives   Vioxx [Rofecoxib] Hives   Yellow Jacket Venom [Bee Venom] Swelling    Social History   Substance and Sexual Activity  Alcohol Use Yes   Comment: 05/11/11 "shot every now and then"    Social History   Tobacco Use  Smoking Status Former   Packs/day: 1.00   Years: 25.00   Total pack years: 25.00   Types: Cigarettes  Smokeless Tobacco Never  Tobacco Comments   quit smoking cigarettes ~ 1990    Review of Systems  Constitutional:  Positive for malaise/fatigue.  HENT:  Positive for sinus pain.   Eyes:  Positive for blurred vision.  Respiratory: Negative.    Cardiovascular: Negative.   Gastrointestinal: Negative.   Genitourinary: Negative.   Musculoskeletal:  Positive for back pain.  Skin: Negative.   Neurological: Negative.   Endo/Heme/Allergies: Negative.   Psychiatric/Behavioral: Negative.      Objective   Vitals:   08/02/22 0939  BP: (!) 159/88  Pulse: 83  Resp: 16  Temp: 97.9 F (36.6 C)  SpO2: 96%    Physical Exam Vitals reviewed.  Constitutional:      Appearance: Normal appearance. He is normal weight. He is not ill-appearing.  HENT:     Head: Normocephalic and atraumatic.  Cardiovascular:      Rate and Rhythm: Normal rate and regular rhythm.     Heart sounds: Normal heart sounds. No murmur heard.    No friction rub. No gallop.  Pulmonary:     Effort: Pulmonary effort is normal. No respiratory distress.     Breath sounds: Normal breath sounds.  No stridor. No wheezing, rhonchi or rales.  Abdominal:     General: Abdomen is flat. Bowel sounds are normal. There is no distension.     Palpations: Abdomen is soft. There is no mass.     Tenderness: There is no abdominal tenderness. There is no guarding or rebound.     Hernia: A hernia is present.     Comments: Easily reducible left inguinal hernia.  Right inguinal surgical scar present.  No right inguinal hernia.  Genitourinary:    Testes: Normal.  Skin:    General: Skin is warm and dry.  Neurological:     Mental Status: He is alert and oriented to person, place, and time.   Previous cardiology notes reviewed  Assessment  Left inguinal hernia, symptomatic History of dysrhythmia Plan  Will schedule robotic assisted laparoscopic left inguinal herniorrhaphy with mesh once he has been cleared by Dr. Lovena Le of cardiology.  He is seeing Dr. Lovena Le on 08/28/2022.  The risks and benefits of the procedure including bleeding, infection, mesh use, cardiopulmonary difficulties, and the possibility of recurrence of the hernia were fully explained to the patient, who gave informed consent.

## 2022-08-28 ENCOUNTER — Ambulatory Visit: Payer: Medicare HMO | Attending: Internal Medicine | Admitting: Internal Medicine

## 2022-08-28 VITALS — BP 126/64 | HR 64 | Ht 68.0 in | Wt 148.2 lb

## 2022-08-28 DIAGNOSIS — I10 Essential (primary) hypertension: Secondary | ICD-10-CM

## 2022-08-28 NOTE — Patient Instructions (Signed)
Medication Instructions:  Your physician recommends that you continue on your current medications as directed. Please refer to the Current Medication list given to you today.  *If you need a refill on your cardiac medications before your next appointment, please call your pharmacy*   Lab Work: NONE   If you have labs (blood work) drawn today and your tests are completely normal, you will receive your results only by: MyChart Message (if you have MyChart) OR A paper copy in the mail If you have any lab test that is abnormal or we need to change your treatment, we will call you to review the results.   Testing/Procedures: NONE    Follow-Up: At [facility], you and your health needs are our priority.  As part of our continuing mission to provide you with exceptional heart care, we have created designated Provider Care Teams.  These Care Teams include your primary Cardiologist (physician) and Advanced Practice Providers (APPs -  Physician Assistants and Nurse Practitioners) who all work together to provide you with the care you need, when you need it.  We recommend signing up for the patient portal called "MyChart".  Sign up information is provided on this After Visit Summary.  MyChart is used to connect with patients for Virtual Visits (Telemedicine).  Patients are able to view lab/test results, encounter notes, upcoming appointments, etc.  Non-urgent messages can be sent to your provider as well.   To learn more about what you can do with MyChart, go to [email or web address].    Your next appointment:   1 year(s)  Provider:   Gregg Taylor, MD    Other Instructions Thank you for choosing [facility]!    

## 2022-08-28 NOTE — Progress Notes (Signed)
HPI Angel Dixon returns today for followup. 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 The patient otherwise feels well. No syncope. His blood sugar has been controlled. His symptoms are improved. His dyspnea is better.  Allergies  Allergen Reactions   Lisinopril Other (See Comments)    Sore throat, no obvious angioedema or cough   Sulfa Antibiotics Hives   Sulfacetamide Hives   Vioxx [Rofecoxib] Hives   Yellow Jacket Venom [Bee Venom] Swelling     Current Outpatient Medications  Medication Sig Dispense Refill   ALPRAZolam (XANAX) 1 MG tablet Take 1 mg by mouth at bedtime as needed for sleep.     amiodarone (PACERONE) 200 MG tablet TAKE 1 TABLET BY MOUTH EVERY DAY 90 tablet 3   amLODipine (NORVASC) 5 MG tablet Take 5 mg by mouth daily.     amoxicillin-clavulanate (AUGMENTIN) 875-125 MG tablet Take 1 tablet by mouth 2 (two) times daily.     Ascorbic Acid (VITAMIN C PO) Take 1 tablet by mouth daily.     aspirin EC 81 MG tablet Take 81 mg by mouth daily. Swallow whole.     atorvastatin (LIPITOR) 40 MG tablet Take 40 mg by mouth daily.     cetirizine (ZYRTEC) 10 MG tablet Take 10 mg by mouth daily as needed.     dapagliflozin propanediol (FARXIGA) 5 MG TABS tablet Take 5 mg by mouth daily.     finasteride (PROSCAR) 5 MG tablet Take 1 tablet (5 mg total) by mouth daily. 90 tablet 3   fluticasone (FLONASE) 50 MCG/ACT nasal spray Place 1 spray into both nostrils as needed for allergies or rhinitis.   11   glipiZIDE (GLUCOTROL) 5 MG tablet Take by mouth 2 (two) times daily.     ibuprofen (ADVIL) 800 MG tablet Take 800 mg by mouth daily.     isosorbide mononitrate (IMDUR) 30 MG 24 hr tablet Take 0.5 tablets (15 mg total) by mouth daily. 30 tablet 1   losartan (COZAAR) 25 MG tablet Take 25 mg by mouth daily.     meclizine (ANTIVERT) 25 MG tablet Take 25 mg by mouth as needed for dizziness.     methocarbamol (ROBAXIN) 500 MG  tablet Take 500 mg by mouth at bedtime.     metoprolol tartrate (LOPRESSOR) 25 MG tablet Take 1 tablet (25 mg total) by mouth 2 (two) times daily. 180 tablet 3   mirabegron ER (MYRBETRIQ) 25 MG TB24 tablet Take 1 tablet (25 mg total) by mouth daily. 30 tablet 11   nitroGLYCERIN (NITROSTAT) 0.4 MG SL tablet Place 1 tablet (0.4 mg total) under the tongue every 5 (five) minutes as needed for chest pain (severe chest pain or pressure only). 25 tablet 3   ONETOUCH ULTRA test strip USE 1 STRIP TO CHECK BLOOD GLUCOSE LEVEL TWICE DAILY.     pantoprazole (PROTONIX) 40 MG tablet Take 1 tablet (40 mg total) by mouth 2 (two) times daily. 60 tablet 2   Polyethylene Glycol 400 (BLINK TEARS OP) Place 1 drop into both eyes 2 (two) times daily.     polyethylene glycol-electrolytes (TRILYTE) 420 g solution Take 4,000 mLs by mouth as directed. 4000 mL 0   silodosin (RAPAFLO) 8 MG CAPS capsule Take 1 capsule (8 mg total) by mouth at bedtime. 90 capsule 3   sitaGLIPtin (JANUVIA) 100 MG tablet Take 100 mg by mouth daily. (Patient not taking: Reported on 08/28/2022)  No current facility-administered medications for this visit.     Past Medical History:  Diagnosis Date   Angina    CAD (coronary artery disease)    moderate, by cath   Diabetes mellitus    DJD (degenerative joint disease)    lumbar lam 4/05   Dyslipidemia    Dysrhythmia    "palpitation", PVCs   GERD (gastroesophageal reflux disease)    Hypertension    PUD (peptic ulcer disease)    endo 03/15/10    ROS:   All systems reviewed and negative except as noted in the HPI.   Past Surgical History:  Procedure Laterality Date   BACK SURGERY  2005   CARDIAC CATHETERIZATION  02/22/2006   mild obs. CAD 60-70% narrowing in small inferior branch of 1st diagonal of LAD, smooth 10% narrowing of mid RCA (Dr. 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 Marella Chimes)    CARDIAC CATHETERIZATION  05/11/2011   nonobstructive CAD (Dr. Roni Bread)   COLONOSCOPY N/A 06/13/2016   Procedure: COLONOSCOPY;  Surgeon: Rogene Houston, MD;  [facility];  Service: Endoscopy;  Laterality: N/A;  [place]   right   LEFT HEART CATHETERIZATION WITH CORONARY ANGIOGRAM N/A 05/14/2011   Procedure: LEFT HEART CATHETERIZATION WITH CORONARY ANGIOGRAM;  Surgeon: [name], MD;  Location: [facility] CATH LAB;  Service: Cardiovascular;  Laterality: N/A;  Coronary angiogram and possible PCI   LUMBAR LAMINECTOMY  09/2003   L2-3, 3-4, 4-5   NM MYOCAR PERF WALL MOTION  10/03/2009   bruce myoview - normal perfusion in all regions, EF 70%, no ischemia, low risk   TRANSTHORACIC ECHOCARDIOGRAM  03/28/2012   EF Q000111Q, grade 1 diastolic dysfunction, mod AV calcification, mild MR     Family History  Problem Relation Age of Onset   Kidney failure Mother        died 37   Coronary artery disease Father        died 33   Heart attack Sister        died of MI in her 17s   Sarcoidosis Daughter        died 52     Social History   Socioeconomic History   Marital status: Married    Spouse name: Not on file   Number of children: 4   Years of education: 12   Highest education level: Not on file  Occupational History   Occupation: retired  Tobacco Use   Smoking status: Former    Packs/day: 1.00    Years: 25.00    Additional pack years: 0.00    Total pack years: 25.00    Types: Cigarettes   Smokeless tobacco: Never   Tobacco comments:    quit smoking cigarettes ~ 1990  Vaping Use   Vaping Use: Never used  Substance and Sexual Activity   Alcohol use: Yes    Comment: 05/11/11 "shot every now and then"   Drug use: No   Sexual activity: Yes  Other Topics Concern   Not on file  Social History Narrative   Not on file   Social Determinants of Health   Financial Resource Strain: Not on file  Food Insecurity: Not on file   Transportation Needs: Not on file  Physical Activity: Not on file  Stress: Not on file  Social Connections: Not on file  Intimate Partner Violence: Not on file     BP 126/64   Pulse 64   Ht 5\' 8"  (1.727 m)   Wt 148 lb 3.2 oz (67.2 kg)   SpO2 95%   BMI 22.53 kg/m   Physical Exam:  Well appearing NAD HEENT: Unremarkable Neck:  No JVD, no thyromegally Lymphatics:  No adenopathy Back:  No CVA tenderness Lungs:  Clear with no wheezes HEART:  Regular rate rhythm, no murmurs, no rubs, no clicks Abd:  soft, positive bowel sounds, no organomegally, no rebound, no guarding Ext:  2 plus pulses, no edema, no cyanosis, no clubbing Skin:  No rashes no nodules Neuro:  CN II through XII intact, motor grossly intact  EKG - nsr  DEVICE  Normal device function.  See PaceArt for details.   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 He will continue amiodarone 200 mg daily and we will recheck labs in a year. I would anticipate checking his heart monitor in a year. CAD - he denies anginal symptoms. We will follow. HTN - his bp is controlled reasonably well at home. SBP is high today.    Carleene Overlie Samadhi Mahurin,MD

## 2022-08-30 ENCOUNTER — Encounter (INDEPENDENT_AMBULATORY_CARE_PROVIDER_SITE_OTHER): Payer: Medicare HMO | Admitting: Ophthalmology

## 2022-09-26 ENCOUNTER — Ambulatory Visit (HOSPITAL_COMMUNITY)
Admission: RE | Admit: 2022-09-26 | Discharge: 2022-09-26 | Disposition: A | Discharge status: Home / Self Care | Payer: Medicare HMO | Source: Ambulatory Visit | Attending: Family Medicine | Admitting: Family Medicine

## 2022-09-26 ENCOUNTER — Other Ambulatory Visit (HOSPITAL_COMMUNITY): Payer: Self-pay | Admitting: Family Medicine

## 2022-09-26 DIAGNOSIS — R0789 Other chest pain: Secondary | ICD-10-CM | POA: Diagnosis not present

## 2022-10-15 ENCOUNTER — Encounter (INDEPENDENT_AMBULATORY_CARE_PROVIDER_SITE_OTHER): Payer: Self-pay | Admitting: *Deleted

## 2022-11-05 ENCOUNTER — Observation Stay (HOSPITAL_COMMUNITY)
Admission: EM | Admit: 2022-11-05 | Discharge: 2022-11-06 | Disposition: A | Discharge status: Home / Self Care | Payer: Medicare HMO | Attending: Family Medicine | Admitting: Family Medicine

## 2022-11-05 ENCOUNTER — Encounter (HOSPITAL_COMMUNITY): Payer: Self-pay | Admitting: Emergency Medicine

## 2022-11-05 ENCOUNTER — Emergency Department (HOSPITAL_COMMUNITY): Payer: Medicare HMO

## 2022-11-05 ENCOUNTER — Other Ambulatory Visit: Payer: Self-pay

## 2022-11-05 DIAGNOSIS — R0789 Other chest pain: Secondary | ICD-10-CM | POA: Diagnosis not present

## 2022-11-05 DIAGNOSIS — I2510 Atherosclerotic heart disease of native coronary artery without angina pectoris: Secondary | ICD-10-CM | POA: Diagnosis not present

## 2022-11-05 DIAGNOSIS — Z7984 Long term (current) use of oral hypoglycemic drugs: Secondary | ICD-10-CM | POA: Insufficient documentation

## 2022-11-05 DIAGNOSIS — E119 Type 2 diabetes mellitus without complications: Secondary | ICD-10-CM | POA: Diagnosis not present

## 2022-11-05 DIAGNOSIS — I493 Ventricular premature depolarization: Secondary | ICD-10-CM | POA: Diagnosis not present

## 2022-11-05 DIAGNOSIS — Z7982 Long term (current) use of aspirin: Secondary | ICD-10-CM | POA: Diagnosis not present

## 2022-11-05 DIAGNOSIS — Z87891 Personal history of nicotine dependence: Secondary | ICD-10-CM | POA: Insufficient documentation

## 2022-11-05 DIAGNOSIS — R079 Chest pain, unspecified: Secondary | ICD-10-CM | POA: Diagnosis not present

## 2022-11-05 DIAGNOSIS — Z79899 Other long term (current) drug therapy: Secondary | ICD-10-CM | POA: Insufficient documentation

## 2022-11-05 DIAGNOSIS — I10 Essential (primary) hypertension: Secondary | ICD-10-CM | POA: Diagnosis not present

## 2022-11-05 DIAGNOSIS — I4729 Other ventricular tachycardia: Secondary | ICD-10-CM | POA: Diagnosis present

## 2022-11-05 LAB — HEPATIC FUNCTION PANEL
ALT: 54 U/L — ABNORMAL HIGH (ref 0–44)
AST: 33 U/L (ref 15–41)
Albumin: 3.9 g/dL (ref 3.5–5.0)
Alkaline Phosphatase: 91 U/L (ref 38–126)
Bilirubin, Direct: 0.2 mg/dL (ref 0.0–0.2)
Indirect Bilirubin: 1.5 mg/dL — ABNORMAL HIGH (ref 0.3–0.9)
Total Bilirubin: 1.7 mg/dL — ABNORMAL HIGH (ref 0.3–1.2)
Total Protein: 7.4 g/dL (ref 6.5–8.1)

## 2022-11-05 LAB — GLUCOSE, CAPILLARY: Glucose-Capillary: 166 mg/dL — ABNORMAL HIGH (ref 70–99)

## 2022-11-05 LAB — BASIC METABOLIC PANEL
Anion gap: 10 (ref 5–15)
BUN: 19 mg/dL (ref 8–23)
CO2: 27 mmol/L (ref 22–32)
Calcium: 8.9 mg/dL (ref 8.9–10.3)
Chloride: 99 mmol/L (ref 98–111)
Creatinine, Ser: 1.21 mg/dL (ref 0.61–1.24)
GFR, Estimated: 60 mL/min — ABNORMAL LOW (ref >60)
Glucose, Bld: 233 mg/dL — ABNORMAL HIGH (ref 70–99)
Potassium: 3.8 mmol/L (ref 3.5–5.1)
Sodium: 136 mmol/L (ref 135–145)

## 2022-11-05 LAB — TROPONIN I (HIGH SENSITIVITY)
Troponin I (High Sensitivity): 5 ng/L (ref <18)
Troponin I (High Sensitivity): 5 ng/L (ref <18)

## 2022-11-05 LAB — CBC
HCT: 37.4 % — ABNORMAL LOW (ref 39.0–52.0)
Hemoglobin: 12.3 g/dL — ABNORMAL LOW (ref 13.0–17.0)
MCH: 28.7 pg (ref 26.0–34.0)
MCHC: 32.9 g/dL (ref 30.0–36.0)
MCV: 87.4 fL (ref 80.0–100.0)
Platelets: 277 10*3/uL (ref 150–400)
RBC: 4.28 MIL/uL (ref 4.22–5.81)
RDW: 12.5 % (ref 11.5–15.5)
WBC: 6.2 10*3/uL (ref 4.0–10.5)
nRBC: 0 % (ref 0.0–0.2)

## 2022-11-05 LAB — MAGNESIUM: Magnesium: 2.1 mg/dL (ref 1.7–2.4)

## 2022-11-05 MED ORDER — ASPIRIN 81 MG PO TBEC
81.0000 mg | DELAYED_RELEASE_TABLET | Freq: Every day | ORAL | Status: DC
  Administered 2022-11-06: 81 mg via ORAL
  Filled 2022-11-05: qty 1

## 2022-11-05 MED ORDER — METOPROLOL TARTRATE 25 MG PO TABS
25.0000 mg | ORAL_TABLET | Freq: Two times a day (BID) | ORAL | Status: DC
  Administered 2022-11-05 – 2022-11-06 (×2): 25 mg via ORAL
  Filled 2022-11-05 (×2): qty 1

## 2022-11-05 MED ORDER — PANTOPRAZOLE SODIUM 40 MG PO TBEC
40.0000 mg | DELAYED_RELEASE_TABLET | Freq: Two times a day (BID) | ORAL | Status: DC
  Administered 2022-11-05 – 2022-11-06 (×2): 40 mg via ORAL
  Filled 2022-11-05 (×2): qty 1

## 2022-11-05 MED ORDER — ACETAMINOPHEN 650 MG RE SUPP: 650.0000 mg | Freq: Four times a day (QID) | RECTAL | Status: DC | PRN

## 2022-11-05 MED ORDER — INSULIN ASPART 100 UNIT/ML IJ SOLN: 0.0000 [IU] | Freq: Every day | INTRAMUSCULAR | Status: DC

## 2022-11-05 MED ORDER — INSULIN ASPART 100 UNIT/ML IJ SOLN
0.0000 [IU] | Freq: Three times a day (TID) | INTRAMUSCULAR | Status: DC
  Administered 2022-11-06: 8 [IU] via SUBCUTANEOUS

## 2022-11-05 MED ORDER — POLYETHYLENE GLYCOL 3350 17 G PO PACK: 17.0000 g | PACK | Freq: Every day | ORAL | Status: DC | PRN

## 2022-11-05 MED ORDER — ISOSORBIDE MONONITRATE ER 30 MG PO TB24
15.0000 mg | ORAL_TABLET | Freq: Every day | ORAL | Status: DC
  Administered 2022-11-06: 15 mg via ORAL
  Filled 2022-11-05: qty 1

## 2022-11-05 MED ORDER — REGADENOSON 0.4 MG/5ML IV SOLN
0.4000 mg | Freq: Once | INTRAVENOUS | Status: AC
  Filled 2022-11-05: qty 5

## 2022-11-05 MED ORDER — ENOXAPARIN SODIUM 40 MG/0.4ML IJ SOSY
40.0000 mg | PREFILLED_SYRINGE | INTRAMUSCULAR | Status: DC
  Administered 2022-11-05: 40 mg via SUBCUTANEOUS
  Filled 2022-11-05: qty 0.4

## 2022-11-05 MED ORDER — ATORVASTATIN CALCIUM 40 MG PO TABS
40.0000 mg | ORAL_TABLET | Freq: Every day | ORAL | Status: DC
  Administered 2022-11-06: 40 mg via ORAL
  Filled 2022-11-05: qty 1

## 2022-11-05 MED ORDER — NITROGLYCERIN 0.4 MG SL SUBL: 0.4000 mg | SUBLINGUAL_TABLET | SUBLINGUAL | Status: DC | PRN

## 2022-11-05 MED ORDER — ACETAMINOPHEN 325 MG PO TABS: 650.0000 mg | ORAL_TABLET | Freq: Four times a day (QID) | ORAL | Status: DC | PRN

## 2022-11-05 MED ORDER — FINASTERIDE 5 MG PO TABS
5.0000 mg | ORAL_TABLET | Freq: Every day | ORAL | Status: DC
  Administered 2022-11-06: 5 mg via ORAL
  Filled 2022-11-05: qty 1

## 2022-11-05 NOTE — Consult Note (Signed)
CARDIOLOGY CONSULT NOTE       Patient ID: Angel Dixon MRN: [id number] DOB/AGE: 82-Feb-1942 81 y.o.  Admit date: 11/05/2022 Referring Physician: [facility] Primary Physician: [name], MD Primary Cardiologist: Hilty/Taylor Reason for Consultation: PVC/Chest pain  Principal Problem:   Chest pain   HPI:  82 y.o. seen in ER at request of Dr Johny Blamer. 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 Monitor August 2022 with 20% burden and started on amiodarone Repeat not done on AAT. Cath in 2012 non obstructive CAD Myovue March 2022 non ischemic normal EF  TTE May 2022 EF normal 65-70% AV sclerosis trivial MR  This week has felt more palpitations and occasional tightness in chest with yard work episode yesterday and today Nitro with partial help today. ECG no acute per ER doctor not available in ER and not scanned yet No pain currently and troponin negative x 2 , K 3.8.  Telemetry with some ambient PVCls no NSVT  Sees Dr Sudie Bailey as primary nothing new going on  He is sedentary other than around the house and yard work No dyspnea , syncope or diaphoresis Retired from [employer] 5 kids near by son with him in ER Wifes health is ok Denies smoking or ETOH excess   ROS All other systems reviewed and negative except as noted above  Past Medical History:  Diagnosis Date   Angina    CAD (coronary artery disease)    moderate, by cath   Diabetes mellitus    DJD (degenerative joint disease)    lumbar lam 4/05   Dyslipidemia    Dysrhythmia    "palpitation", PVCs   GERD (gastroesophageal reflux disease)    Hypertension    PUD (peptic ulcer disease)    endo 03/15/10    Family History  Problem Relation Age of Onset   Kidney failure Mother        died 7   Coronary artery disease Father        died 35   Heart attack Sister        died of MI in her 12s   Sarcoidosis Daughter        died 84    Social History   Socioeconomic History   Marital  status: Married    Spouse name: Not on file   Number of children: 4   Years of education: 12   Highest education level: Not on file  Occupational History   Occupation: retired  Tobacco Use   Smoking status: Former    Packs/day: 1.00    Years: 25.00    Additional pack years: 0.00    Total pack years: 25.00    Types: Cigarettes   Smokeless tobacco: Never   Tobacco comments:    quit smoking cigarettes ~ 1990  Vaping Use   Vaping Use: Never used  Substance and Sexual Activity   Alcohol use: Yes    Comment: 05/11/11 "shot every now and then"   Drug use: No   Sexual activity: Yes  Other Topics Concern   Not on file  Social History Narrative   Not on file   Social Determinants of Health   Financial Resource Strain: Not on file  Food Insecurity: Not on file  Transportation Needs: Not on file  Physical Activity: Not on file  Stress: Not on file  Social Connections: Not on file  Intimate Partner Violence: Not on file    Past Surgical History:  Procedure Laterality  Date   BACK SURGERY  2005   CARDIAC CATHETERIZATION  02/22/2006   mild obs. CAD 60-70% narrowing in small inferior branch of 1st diagonal of LAD, smooth 10% narrowing of mid RCA (Dr. 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 Jonette Eva)   CARDIAC CATHETERIZATION  05/11/2011   nonobstructive CAD (Dr. Ranae Palms)   COLONOSCOPY N/A 06/13/2016   Procedure: COLONOSCOPY;  Surgeon: Malissa Hippo, MD;  Location: [facility];  Service: Endoscopy;  Laterality: N/A;  1200   HERNIA REPAIR     INGUINAL HERNIA REPAIR  1982   right   LEFT HEART CATHETERIZATION WITH CORONARY ANGIOGRAM N/A 05/14/2011   Procedure: LEFT HEART CATHETERIZATION WITH CORONARY ANGIOGRAM;  Surgeon: Marykay Lex, MD;  Location: [facility] CATH LAB;  Service: Cardiovascular;  Laterality: N/A;  Coronary angiogram and possible PCI   LUMBAR LAMINECTOMY  09/2003   L2-3, 3-4,  4-5   NM MYOCAR PERF WALL MOTION  10/03/2009   bruce myoview - normal perfusion in all regions, EF 70%, no ischemia, low risk   TRANSTHORACIC ECHOCARDIOGRAM  03/28/2012   EF 65-70%, grade 1 diastolic dysfunction, mod AV calcification, mild MR      Current Facility-Administered Medications:    [START ON 11/06/2022] regadenoson (LEXISCAN) injection SOLN 0.4 mg, 0.4 mg, Intravenous, Once, Wendall Stade, MD  Current Outpatient Medications:    ALPRAZolam (XANAX) 1 MG tablet, Take 1 mg by mouth at bedtime as needed for sleep., Disp: , Rfl:    amiodarone (PACERONE) 200 MG tablet, TAKE 1 TABLET BY MOUTH EVERY DAY, Disp: 90 tablet, Rfl: 3   amLODipine (NORVASC) 5 MG tablet, Take 5 mg by mouth daily., Disp: , Rfl:    amoxicillin-clavulanate (AUGMENTIN) 875-125 MG tablet, Take 1 tablet by mouth 2 (two) times daily., Disp: , Rfl:    Ascorbic Acid (VITAMIN C PO), Take 1 tablet by mouth daily., Disp: , Rfl:    aspirin EC 81 MG tablet, Take 81 mg by mouth daily. Swallow whole., Disp: , Rfl:    atorvastatin (LIPITOR) 40 MG tablet, Take 40 mg by mouth daily., Disp: , Rfl:    cetirizine (ZYRTEC) 10 MG tablet, Take 10 mg by mouth daily as needed., Disp: , Rfl:    dapagliflozin propanediol (FARXIGA) 5 MG TABS tablet, Take 5 mg by mouth daily., Disp: , Rfl:    finasteride (PROSCAR) 5 MG tablet, Take 1 tablet (5 mg total) by mouth daily., Disp: 90 tablet, Rfl: 3   fluticasone (FLONASE) 50 MCG/ACT nasal spray, Place 1 spray into both nostrils as needed for allergies or rhinitis. , Disp: , Rfl: 11   glipiZIDE (GLUCOTROL) 5 MG tablet, Take by mouth 2 (two) times daily., Disp: , Rfl:    ibuprofen (ADVIL) 800 MG tablet, Take 800 mg by mouth daily., Disp: , Rfl:    isosorbide mononitrate (IMDUR) 30 MG 24 hr tablet, Take 0.5 tablets (15 mg total) by mouth daily., Disp: 30 tablet, Rfl: 1   losartan (COZAAR) 25 MG tablet, Take 25 mg by mouth daily., Disp: , Rfl:    meclizine (ANTIVERT) 25 MG tablet, Take 25 mg by mouth as  needed for dizziness., Disp: , Rfl:    methocarbamol (ROBAXIN) 500 MG tablet, Take 500 mg by mouth at bedtime., Disp: , Rfl:    metoprolol tartrate (LOPRESSOR) 25 MG tablet, Take 1 tablet (25 mg total) by mouth 2 (two) times daily., Disp: 180 tablet,  Rfl: 3   mirabegron ER (MYRBETRIQ) 25 MG TB24 tablet, Take 1 tablet (25 mg total) by mouth daily., Disp: 30 tablet, Rfl: 11   nitroGLYCERIN (NITROSTAT) 0.4 MG SL tablet, Place 1 tablet (0.4 mg total) under the tongue every 5 (five) minutes as needed for chest pain (severe chest pain or pressure only)., Disp: 25 tablet, Rfl: 3   ONETOUCH ULTRA test strip, USE 1 STRIP TO CHECK BLOOD GLUCOSE LEVEL TWICE DAILY., Disp: , Rfl:    pantoprazole (PROTONIX) 40 MG tablet, Take 1 tablet (40 mg total) by mouth 2 (two) times daily., Disp: 60 tablet, Rfl: 2   Polyethylene Glycol 400 (BLINK TEARS OP), Place 1 drop into both eyes 2 (two) times daily., Disp: , Rfl:    polyethylene glycol-electrolytes (TRILYTE) 420 g solution, Take 4,000 mLs by mouth as directed., Disp: 4000 mL, Rfl: 0   silodosin (RAPAFLO) 8 MG CAPS capsule, Take 1 capsule (8 mg total) by mouth at bedtime., Disp: 90 capsule, Rfl: 3   sitaGLIPtin (JANUVIA) 100 MG tablet, Take 100 mg by mouth daily. (Patient not taking: Reported on 08/28/2022), Disp: , Rfl:   [START ON 11/06/2022] regadenoson  0.4 mg Intravenous Once     Physical Exam: Blood pressure 127/66, pulse 82, temperature 98.1 F (36.7 C), temperature source Oral, resp. rate 17, height 5\' 8"  (1.727 m), weight 64.4 kg, SpO2 97 %.    Affect appropriate Healthy:  appears stated age HEENT: normal Neck supple with no adenopathy JVP normal no bruits no thyromegaly Lungs clear with no wheezing and good diaphragmatic motion Heart:  S1/S2 SEM murmur, no rub, gallop or click PMI normal Abdomen: benighn, BS positve, no tenderness, no AAA no bruit.  No HSM or HJR Distal pulses intact with no bruits No edema Neuro non-focal Skin warm and dry No  muscular weakness   Labs:   Lab Results  Component Value Date   WBC 6.2 11/05/2022   HGB 12.3 (L) 11/05/2022   HCT 37.4 (L) 11/05/2022   MCV 87.4 11/05/2022   PLT 277 11/05/2022    Recent Labs  Lab 11/05/22 1357  NA 136  K 3.8  CL 99  CO2 27  BUN 19  CREATININE 1.21  CALCIUM 8.9  PROT 7.4  BILITOT 1.7*  ALKPHOS 91  ALT 54*  AST 33  GLUCOSE 233*   Lab Results  Component Value Date   CKTOTAL 312 11/18/2016   CKMB 1.9 05/12/2011   TROPONINI <0.03 11/18/2016    Lab Results  Component Value Date   CHOL 121 03/28/2012   CHOL  04/28/2008    171        ATP III CLASSIFICATION:  <200     mg/dL   Desirable  161-096  mg/dL   Borderline High  >=045    mg/dL   High   Lab Results  Component Value Date   HDL 30 (L) 03/28/2012   HDL 30 (L) 04/28/2008   Lab Results  Component Value Date   LDLCALC 47 03/28/2012   LDLCALC (H) 04/28/2008    104        Total Cholesterol/HDL:CHD Risk Coronary Heart Disease Risk Table                     Men   Women  1/2 Average Risk   3.4   3.3   Lab Results  Component Value Date   TRIG 222 (H) 03/28/2012   TRIG 184 (H) 04/28/2008   Lab Results  Component  Value Date   CHOLHDL 4.0 03/28/2012   CHOLHDL 5.7 04/28/2008   No results found for: "LDLDIRECT"    Radiology: DG Chest 2 View  Result Date: 11/05/2022 CLINICAL DATA:  Chest pain and shortness of breath. EXAM: CHEST - 2 VIEW COMPARISON:  09/26/2022 prior radiographs FINDINGS: Telemetry leads overlie the chest. The cardiomediastinal silhouette is unremarkable. There is no evidence of focal airspace disease, pulmonary edema, suspicious pulmonary nodule/mass, pleural effusion, or pneumothorax. No acute bony abnormalities are identified. IMPRESSION: No active cardiopulmonary disease. Electronically Signed   By: Harmon Pier M.D.   On: 11/05/2022 14:37    EKG: pending scan in ER not acute per ER doctor    ASSESSMENT AND PLAN:   PVCls : known on amiodarone with prior non  obstructive CAD and normal EF ? RVOT origin Continue home amiodarone for now. Update TTE for EF Check Mg Chest Pain:  self limited non acute ECG in setting of prior moderate non obstructive CAD cath 2012 and non ischemic myovue in 2022 will update inpatient Lexiscan myovue in am Continue home nitrates and beta blocker   Have written orders for echo and myovue NPO in am   Signed: Charlton Haws 11/05/2022, 4:19 PM

## 2022-11-05 NOTE — ED Triage Notes (Signed)
Pt reports chest tightness with SOB, lightheadedness, and palpitations first noticed yesterday but resolved with NTG; symptoms returned today and have not improved after NTG. Central chest pain radiates to left lower rib cage. Pt denies nausea and diaphoresis. Hx palpitations, HTN, HLD, DM2. A/O x 4

## 2022-11-05 NOTE — Assessment & Plan Note (Signed)
Cath in 2012 non obstructive CAD Myovue March 2022 non ischemic normal EF.  EKG, troponins unremarkable. -N.p.o. midnight -Evaluated by cardiology, plans for stress test in a.m, Echocardiogram -Continue home nitrates, beta-blocker

## 2022-11-05 NOTE — Assessment & Plan Note (Signed)
>>  ASSESSMENT AND PLAN FOR DIABETES MELLITUS WITHOUT COMPLICATION (HCC) WRITTEN ON 11/05/2022  7:07 PM BY EMOKPAE, EJIROGHENE E, MD  - HgbA1c - SSI- M -Hold home Farxiga , glipizide , Januvia

## 2022-11-05 NOTE — ED Provider Notes (Signed)
[place] EMERGENCY DEPARTMENT AT [facility] Provider Note   CSN: [id number] Arrival date & time: 11/05/22  1324     History {Add pertinent medical, surgical, social history, OB history to HPI:1} Chief Complaint  Patient presents with   Chest Pain    Angel Dixon is a 82 y.o. male.  Patient with a history of diabetes and coronary disease along with PVCs.  Patient states that he had chest discomfort yesterday with palpitations that took over an hour to go away and then today he had similar symptoms and took a nitro that helped some  The history is provided by the patient and medical records. No language interpreter was used.  Chest Pain Pain location:  L chest Pain quality: aching   Pain radiates to:  Does not radiate Pain severity:  Moderate Onset quality:  Sudden Timing:  Intermittent Progression:  Waxing and waning Chronicity:  New Relieved by:  Nitroglycerin Worsened by:  Nothing Ineffective treatments:  None tried Associated symptoms: no abdominal pain, no back pain, no cough, no fatigue and no headache        Home Medications Prior to Admission medications   Medication Sig Start Date End Date Taking? Authorizing Provider  ALPRAZolam Prudy Feeler) 1 MG tablet Take 1 mg by mouth at bedtime as needed for sleep.    [provider]  amiodarone (PACERONE) 200 MG tablet TAKE 1 TABLET BY MOUTH EVERY DAY 05/14/22   Marinus Maw, MD  amLODipine (NORVASC) 5 MG tablet Take 5 mg by mouth daily. 08/03/16   [provider]  amoxicillin-clavulanate (AUGMENTIN) 875-125 MG tablet Take 1 tablet by mouth 2 (two) times daily.    [provider]  Ascorbic Acid (VITAMIN C PO) Take 1 tablet by mouth daily.    [provider]  aspirin EC 81 MG tablet Take 81 mg by mouth daily. Swallow whole.    [provider]  atorvastatin (LIPITOR) 40 MG tablet Take 40 mg by mouth daily.    [provider]  cetirizine (ZYRTEC) 10 MG tablet  Take 10 mg by mouth daily as needed.    [name], MD  dapagliflozin propanediol (FARXIGA) 5 MG TABS tablet Take 5 mg by mouth daily.    [provider]  finasteride (PROSCAR) 5 MG tablet Take 1 tablet (5 mg total) by mouth daily. 03/07/22   McKenzie, Mardene Celeste, MD  fluticasone (FLONASE) 50 MCG/ACT nasal spray Place 1 spray into both nostrils as needed for allergies or rhinitis.  09/20/14   [provider]  glipiZIDE (GLUCOTROL) 5 MG tablet Take by mouth 2 (two) times daily.    [provider]  ibuprofen (ADVIL) 800 MG tablet Take 800 mg by mouth daily.    [provider]  isosorbide mononitrate (IMDUR) 30 MG 24 hr tablet Take 0.5 tablets (15 mg total) by mouth daily. 10/27/20   Vassie Loll, MD  losartan (COZAAR) 25 MG tablet Take 25 mg by mouth daily.    [provider]  meclizine (ANTIVERT) 25 MG tablet Take 25 mg by mouth as needed for dizziness. 04/14/20   [provider]  methocarbamol (ROBAXIN) 500 MG tablet Take 500 mg by mouth at bedtime.    [provider]  metoprolol tartrate (LOPRESSOR) 25 MG tablet Take 1 tablet (25 mg total) by mouth 2 (two) times daily. 04/04/21   Marinus Maw, MD  mirabegron ER (MYRBETRIQ) 25 MG TB24 tablet Take 1 tablet (25 mg total) by mouth daily. 03/07/22  McKenzie, Mardene Celeste, MD  nitroGLYCERIN (NITROSTAT) 0.4 MG SL tablet Place 1 tablet (0.4 mg total) under the tongue every 5 (five) minutes as needed for chest pain (severe chest pain or pressure only). 08/12/20 05/19/29  HiltyLisette Abu, MD  ONETOUCH ULTRA test strip USE 1 STRIP TO CHECK BLOOD GLUCOSE LEVEL TWICE DAILY. 11/22/20   [provider]  pantoprazole (PROTONIX) 40 MG tablet Take 1 tablet (40 mg total) by mouth 2 (two) times daily. 10/26/20   Vassie Loll, MD  Polyethylene Glycol 400 (BLINK TEARS OP) Place 1 drop into both eyes 2 (two) times daily.    [provider]  polyethylene glycol-electrolytes (TRILYTE) 420 g  solution Take 4,000 mLs by mouth as directed. 03/13/22   Dolores Frame, MD  silodosin (RAPAFLO) 8 MG CAPS capsule Take 1 capsule (8 mg total) by mouth at bedtime. 03/07/22   McKenzie, Mardene Celeste, MD  sitaGLIPtin (JANUVIA) 100 MG tablet Take 100 mg by mouth daily. Patient not taking: Reported on 08/28/2022    [name], MD      Allergies    Lisinopril, Sulfa antibiotics, Sulfacetamide, Vioxx [rofecoxib], and Yellow jacket venom [bee venom]    Review of Systems   Review of Systems  Constitutional:  Negative for appetite change and fatigue.  HENT:  Negative for congestion, ear discharge and sinus pressure.   Eyes:  Negative for discharge.  Respiratory:  Negative for cough.   Cardiovascular:  Positive for chest pain.  Gastrointestinal:  Negative for abdominal pain and diarrhea.  Genitourinary:  Negative for frequency and hematuria.  Musculoskeletal:  Negative for back pain.  Skin:  Negative for rash.  Neurological:  Negative for seizures and headaches.  Psychiatric/Behavioral:  Negative for hallucinations.     Physical Exam Updated Vital Signs BP 127/66   Pulse 82   Temp 98.1 F (36.7 C) (Oral)   Resp 17   Ht 5\' 8"  (1.727 m)   Wt 64.4 kg   SpO2 97%   BMI 21.59 kg/m  Physical Exam Vitals and nursing note reviewed.  Constitutional:      Appearance: He is well-developed.  HENT:     Head: Normocephalic.     Nose: Nose normal.  Eyes:     General: No scleral icterus.    Conjunctiva/sclera: Conjunctivae normal.  Neck:     Thyroid: No thyromegaly.  Cardiovascular:     Rate and Rhythm: Normal rate and regular rhythm.     Heart sounds: No murmur heard.    No friction rub. No gallop.  Pulmonary:     Breath sounds: No stridor. No wheezing or rales.  Chest:     Chest wall: No tenderness.  Abdominal:     General: There is no distension.     Tenderness: There is no abdominal tenderness. There is no rebound.  Musculoskeletal:        General: Normal range of  motion.     Cervical back: Neck supple.  Lymphadenopathy:     Cervical: No cervical adenopathy.  Skin:    Findings: No erythema or rash.  Neurological:     Mental Status: He is alert and oriented to person, place, and time.     Motor: No abnormal muscle tone.     Coordination: Coordination normal.  Psychiatric:        Behavior: Behavior normal.     ED Results / Procedures / Treatments   Labs (all labs ordered are listed, but only abnormal results are displayed) Labs Reviewed  BASIC METABOLIC PANEL - Abnormal; Notable for the following components:      Result Value   Glucose, Bld 233 (*)    GFR, Estimated 60 (*)    All other components within normal limits  CBC - Abnormal; Notable for the following components:   Hemoglobin 12.3 (*)    HCT 37.4 (*)    All other components within normal limits  HEPATIC FUNCTION PANEL - Abnormal; Notable for the following components:   ALT 54 (*)    Total Bilirubin 1.7 (*)    Indirect Bilirubin 1.5 (*)    All other components within normal limits  TROPONIN I (HIGH SENSITIVITY)  TROPONIN I (HIGH SENSITIVITY)    EKG None  Radiology DG Chest 2 View  Result Date: 11/05/2022 CLINICAL DATA:  Chest pain and shortness of breath. EXAM: CHEST - 2 VIEW COMPARISON:  09/26/2022 prior radiographs FINDINGS: Telemetry leads overlie the chest. The cardiomediastinal silhouette is unremarkable. There is no evidence of focal airspace disease, pulmonary edema, suspicious pulmonary nodule/mass, pleural effusion, or pneumothorax. No acute bony abnormalities are identified. IMPRESSION: No active cardiopulmonary disease. Electronically Signed   By: Harmon Pier M.D.   On: 11/05/2022 14:37    Procedures Procedures  {Document cardiac monitor, telemetry assessment procedure when appropriate:1}  Medications Ordered in ED Medications  regadenoson (LEXISCAN) injection SOLN 0.4 mg (has no administration in time range)    ED Course/ Medical Decision Making/ A&P   {I  spoke with cardiology Dr. Eden Emms and he will see the patient and requested hospitalist admission Click here for ABCD2, HEART and other calculatorsREFRESH Note before signing :1}                          Medical Decision Making Amount and/or Complexity of Data Reviewed Labs: ordered. Radiology: ordered.  Risk Decision regarding hospitalization.   Patient with chest pain and PVCs.  He will be admitted to medicine with cardiology consult  {Document critical care time when appropriate:1} {Document review of labs and clinical decision tools ie heart score, Chads2Vasc2 etc:1}  {Document your independent review of radiology images, and any outside records:1} {Document your discussion with family members, caretakers, and with consultants:1} {Document social determinants of health affecting pt's care:1} {Document your decision making why or why not admission, treatments were needed:1} Final Clinical Impression(s) / ED Diagnoses Final diagnoses:  Atypical chest pain    Rx / DC Orders ED Discharge Orders     None

## 2022-11-05 NOTE — Progress Notes (Signed)
EKG done and given to nurse 

## 2022-11-05 NOTE — H&P (Addendum)
History and Physical    Angel Dixon ZOX:[id number] DOB: 1941-04-01 DOA: 11/05/2022  PCP: [name], MD   Patient coming from: Home  I have personally briefly reviewed patient's old medical records in [facility]  Chief Complaint: Chest Pain  HPI: Angel Dixon is a 82 y.o. male with medical history significant for  CAD, DM, HTN, BPH.  Presented to the ED with complaints of central chest tightness radiating to the left side of his chest that yesterday, he was working on regarding when they started.  Reports associated difficulty breathing or lightheadedness.  He reports over the past 2 days he has been having difficulty breathing with exertion. No lower extremity swelling  He was given nitro in the ED which helped with his chest pain.  ED Course: Stable Vitals.  Troponin 5 x 2.  EKG without acute changes .  Cardiologist consulted, will see in consult.  Review of Systems: As per HPI all other systems reviewed and negative.  Past Medical History:  Diagnosis Date   Angina    CAD (coronary artery disease)    moderate, by cath   Diabetes mellitus    DJD (degenerative joint disease)    lumbar lam 4/05   Dyslipidemia    Dysrhythmia    "palpitation", PVCs   GERD (gastroesophageal reflux disease)    Hypertension    PUD (peptic ulcer disease)    endo 03/15/10    Past Surgical History:  Procedure Laterality Date   BACK SURGERY  2005   CARDIAC CATHETERIZATION  02/22/2006   mild obs. CAD 60-70% narrowing in small inferior branch of 1st diagonal of LAD, smooth 10% narrowing of mid RCA (Dr. 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 Jonette Eva)   CARDIAC CATHETERIZATION  05/11/2011   nonobstructive CAD (Dr. Ranae Palms)   COLONOSCOPY N/A 06/13/2016   Procedure: COLONOSCOPY;  Surgeon: Malissa Hippo, MD;  Location: [facility];  Service: Endoscopy;  Laterality: N/A;  1200    HERNIA REPAIR     INGUINAL HERNIA REPAIR  1982   right   LEFT HEART CATHETERIZATION WITH CORONARY ANGIOGRAM N/A 05/14/2011   Procedure: LEFT HEART CATHETERIZATION WITH CORONARY ANGIOGRAM;  Surgeon: Marykay Lex, MD;  Location: [facility] CATH LAB;  Service: Cardiovascular;  Laterality: N/A;  Coronary angiogram and possible PCI   LUMBAR LAMINECTOMY  09/2003   L2-3, 3-4, 4-5   NM MYOCAR PERF WALL MOTION  10/03/2009   bruce myoview - normal perfusion in all regions, EF 70%, no ischemia, low risk   TRANSTHORACIC ECHOCARDIOGRAM  03/28/2012   EF 65-70%, grade 1 diastolic dysfunction, mod AV calcification, mild MR     reports that he has quit smoking. His smoking use included cigarettes. He has a 25.00 pack-year smoking history. He has never used smokeless tobacco. He reports current alcohol use. He reports that he does not use drugs.  Allergies  Allergen Reactions   Lisinopril Other (See Comments)    Sore throat, no obvious angioedema or cough   Sulfa Antibiotics Hives   Sulfacetamide Hives   Vioxx [Rofecoxib] Hives   Yellow Jacket Venom [Bee Venom] Swelling    Family History  Problem Relation Age of Onset   Kidney failure Mother        died 20   Coronary artery disease Father        died 52   Heart attack Sister  died of MI in her 89s   Sarcoidosis Daughter        died 72    Prior to Admission medications   Medication Sig Start Date End Date Taking? Authorizing Provider  ALPRAZolam Prudy Feeler) 1 MG tablet Take 1 mg by mouth at bedtime as needed for sleep.    [provider]  amiodarone (PACERONE) 200 MG tablet TAKE 1 TABLET BY MOUTH EVERY DAY 05/14/22   Marinus Maw, MD  amLODipine (NORVASC) 5 MG tablet Take 5 mg by mouth daily. 08/03/16   [provider]  amoxicillin-clavulanate (AUGMENTIN) 875-125 MG tablet Take 1 tablet by mouth 2 (two) times daily.    [provider]  Ascorbic Acid (VITAMIN C PO) Take 1 tablet by mouth daily.    [provider]   aspirin EC 81 MG tablet Take 81 mg by mouth daily. Swallow whole.    [provider]  atorvastatin (LIPITOR) 40 MG tablet Take 40 mg by mouth daily.    [provider]  cetirizine (ZYRTEC) 10 MG tablet Take 10 mg by mouth daily as needed.    [name], MD  dapagliflozin propanediol (FARXIGA) 5 MG TABS tablet Take 5 mg by mouth daily.    [provider]  finasteride (PROSCAR) 5 MG tablet Take 1 tablet (5 mg total) by mouth daily. 03/07/22   McKenzie, Mardene Celeste, MD  fluticasone (FLONASE) 50 MCG/ACT nasal spray Place 1 spray into both nostrils as needed for allergies or rhinitis.  09/20/14   [provider]  glipiZIDE (GLUCOTROL) 5 MG tablet Take by mouth 2 (two) times daily.    [provider]  ibuprofen (ADVIL) 800 MG tablet Take 800 mg by mouth daily.    [provider]  isosorbide mononitrate (IMDUR) 30 MG 24 hr tablet Take 0.5 tablets (15 mg total) by mouth daily. 10/27/20   Vassie Loll, MD  losartan (COZAAR) 25 MG tablet Take 25 mg by mouth daily.    [provider]  meclizine (ANTIVERT) 25 MG tablet Take 25 mg by mouth as needed for dizziness. 04/14/20   [provider]  methocarbamol (ROBAXIN) 500 MG tablet Take 500 mg by mouth at bedtime.    [provider]  metoprolol tartrate (LOPRESSOR) 25 MG tablet Take 1 tablet (25 mg total) by mouth 2 (two) times daily. 04/04/21   Marinus Maw, MD  mirabegron ER (MYRBETRIQ) 25 MG TB24 tablet Take 1 tablet (25 mg total) by mouth daily. 03/07/22   McKenzie, Mardene Celeste, MD  nitroGLYCERIN (NITROSTAT) 0.4 MG SL tablet Place 1 tablet (0.4 mg total) under the tongue every 5 (five) minutes as needed for chest pain (severe chest pain or pressure only). 08/12/20 05/19/29  HiltyLisette Abu, MD  ONETOUCH ULTRA test strip USE 1 STRIP TO CHECK BLOOD GLUCOSE LEVEL TWICE DAILY. 11/22/20   [provider]  pantoprazole (PROTONIX) 40 MG tablet Take 1 tablet (40 mg total) by  mouth 2 (two) times daily. 10/26/20   Vassie Loll, MD  Polyethylene Glycol 400 (BLINK TEARS OP) Place 1 drop into both eyes 2 (two) times daily.    [provider]  polyethylene glycol-electrolytes (TRILYTE) 420 g solution Take 4,000 mLs by mouth as directed. 03/13/22   Dolores Frame, MD  silodosin (RAPAFLO) 8 MG CAPS capsule Take 1 capsule (8 mg total) by mouth at bedtime. 03/07/22   McKenzie, Mardene Celeste, MD  sitaGLIPtin (JANUVIA) 100 MG tablet Take 100 mg by mouth daily. Patient not taking:  Reported on 08/28/2022    [name], MD    Physical Exam: Vitals:   11/05/22 1530 11/05/22 1630 11/05/22 1700 11/05/22 1738  BP: 127/66 125/66 112/76 (!) 161/90  Pulse: 82 68 69 73  Resp: 17 14 16 17   Temp:    97.7 F (36.5 C)  TempSrc:    Oral  SpO2: 97% 98% 98% 100%  Weight:    63.9 kg  Height:    5\' 8"  (1.727 m)    Constitutional: NAD, calm, comfortable, appears younger than stated age Vitals:   11/05/22 1530 11/05/22 1630 11/05/22 1700 11/05/22 1738  BP: 127/66 125/66 112/76 (!) 161/90  Pulse: 82 68 69 73  Resp: 17 14 16 17   Temp:    97.7 F (36.5 C)  TempSrc:    Oral  SpO2: 97% 98% 98% 100%  Weight:    63.9 kg  Height:    5\' 8"  (1.727 m)   Eyes: PERRL, lids and conjunctivae normal ENMT: Mucous membranes are moist.   Neck: normal, supple, no masses, no thyromegaly Respiratory: clear to auscultation bilaterally, no wheezing, no crackles. Normal respiratory effort. No accessory muscle use.  Cardiovascular: Regular rate and rhythm, no murmurs / rubs / gallops. No extremity edema.   Extremities warm Abdomen: no tenderness, no masses palpated. No hepatosplenomegaly. Bowel sounds positive.  Musculoskeletal: no clubbing / cyanosis. No joint deformity upper and lower extremities.  Skin: no rashes, lesions, ulcers. No induration Neurologic: No apparent cranial nerve abnormality, moving extremities spontaneously Psychiatric: Normal judgment and insight. Alert and  oriented x 3. Normal mood.   Labs on Admission: I have personally reviewed following labs and imaging studies  CBC: Recent Labs  Lab 11/05/22 1357  WBC 6.2  HGB 12.3*  HCT 37.4*  MCV 87.4  PLT 277   Basic Metabolic Panel: Recent Labs  Lab 11/05/22 1357 11/05/22 1534  NA 136  --   K 3.8  --   CL 99  --   CO2 27  --   GLUCOSE 233*  --   BUN 19  --   CREATININE 1.21  --   CALCIUM 8.9  --   MG  --  2.1   GFR: Estimated Creatinine Clearance: 42.5 mL/min (by C-G formula based on SCr of 1.21 mg/dL). Liver Function Tests: Recent Labs  Lab 11/05/22 1357  AST 33  ALT 54*  ALKPHOS 91  BILITOT 1.7*  PROT 7.4  ALBUMIN 3.9    Radiological Exams on Admission: DG Chest 2 View  Result Date: 11/05/2022 CLINICAL DATA:  Chest pain and shortness of breath. EXAM: CHEST - 2 VIEW COMPARISON:  09/26/2022 prior radiographs FINDINGS: Telemetry leads overlie the chest. The cardiomediastinal silhouette is unremarkable. There is no evidence of focal airspace disease, pulmonary edema, suspicious pulmonary nodule/mass, pleural effusion, or pneumothorax. No acute bony abnormalities are identified. IMPRESSION: No active cardiopulmonary disease. Electronically Signed   By: Harmon Pier M.D.   On: 11/05/2022 14:37    EKG: Independently reviewed.  Sinus rhythm, rate 65, QTc 440.  No significant ST or T wave abnormalities compared to prior.  Assessment/Plan Principal Problem:   Chest pain Active Problems:   Diabetes mellitus without complication [facility])   Essential hypertension   Symptomatic PVCs   CAD (coronary artery disease)   NSVT (nonsustained ventricular tachycardia) (HCC)   Assessment and Plan: * Chest pain Cath in 2012 non obstructive CAD Myovue March 2022 non ischemic normal EF.  EKG, troponins unremarkable. -N.p.o. midnight -Evaluated by cardiology, plans for  stress test in a.m, Echocardiogram -Continue home nitrates, beta-blocker  Essential hypertension Stable. -Resume Norvasc,  metoprolol, losartan  Diabetes mellitus without complication (HCC) - HgbA1c - SSI- M -Hold home Farxiga, glipizide, Januvia  Symptomatic PVCs Follows with Dr. Ladona Ridgel, -Resume amiodarone   DVT prophylaxis: Lovenox Code Status: FULL code- confirmed with patient at bedside Family Communication: Daughter at bedside Disposition Plan: 1 -2 days  Consults called: [facility] Admission status:  Obs tele   Author: Onnie Boer, MD 11/05/2022 7:13 PM  For on call review [email or web address].

## 2022-11-05 NOTE — Assessment & Plan Note (Signed)
Follows with Dr. Ladona Ridgel, -Resume amiodarone

## 2022-11-05 NOTE — Assessment & Plan Note (Signed)
-   HgbA1c - SSI- M -Hold home Farxiga, glipizide, Januvia

## 2022-11-05 NOTE — Assessment & Plan Note (Signed)
Stable. -Resume Norvasc, metoprolol, losartan

## 2022-11-06 ENCOUNTER — Other Ambulatory Visit (HOSPITAL_COMMUNITY): Payer: Self-pay | Admitting: *Deleted

## 2022-11-06 ENCOUNTER — Observation Stay (HOSPITAL_BASED_OUTPATIENT_CLINIC_OR_DEPARTMENT_OTHER): Payer: Medicare HMO

## 2022-11-06 ENCOUNTER — Encounter (HOSPITAL_COMMUNITY): Payer: Self-pay | Admitting: Internal Medicine

## 2022-11-06 DIAGNOSIS — R079 Chest pain, unspecified: Secondary | ICD-10-CM

## 2022-11-06 DIAGNOSIS — I10 Essential (primary) hypertension: Secondary | ICD-10-CM | POA: Diagnosis not present

## 2022-11-06 DIAGNOSIS — I25119 Atherosclerotic heart disease of native coronary artery with unspecified angina pectoris: Secondary | ICD-10-CM | POA: Diagnosis not present

## 2022-11-06 DIAGNOSIS — R0789 Other chest pain: Secondary | ICD-10-CM | POA: Diagnosis not present

## 2022-11-06 DIAGNOSIS — I493 Ventricular premature depolarization: Secondary | ICD-10-CM | POA: Diagnosis not present

## 2022-11-06 LAB — NM MYOCAR MULTI W/SPECT W/WALL MOTION / EF
Base ST Depression (mm): 0 mm
LV dias vol: 77 mL (ref 62–150)
LV sys vol: 25 mL
Nuc Stress EF: 68 %
Peak HR: 93 {beats}/min
RATE: 0.3
Rest HR: 57 {beats}/min
Rest Nuclear Isotope Dose: 10.6 mCi
SDS: 0
SRS: 2
SSS: 2
ST Depression (mm): 0 mm
Stress Nuclear Isotope Dose: 33 mCi
TID: 1.03

## 2022-11-06 LAB — GLUCOSE, CAPILLARY
Glucose-Capillary: 161 mg/dL — ABNORMAL HIGH (ref 70–99)
Glucose-Capillary: 272 mg/dL — ABNORMAL HIGH (ref 70–99)
Glucose-Capillary: 67 mg/dL — ABNORMAL LOW (ref 70–99)
Glucose-Capillary: 92 mg/dL (ref 70–99)

## 2022-11-06 LAB — ECHOCARDIOGRAM COMPLETE
Area-P 1/2: 2.16 cm2
Est EF: >75
Height: 68 in
S' Lateral: 2.7 cm
Weight: 2253.98 oz

## 2022-11-06 MED ORDER — AMIODARONE HCL 200 MG PO TABS
200.0000 mg | ORAL_TABLET | Freq: Every day | ORAL | Status: DC
  Administered 2022-11-06: 200 mg via ORAL
  Filled 2022-11-06: qty 1

## 2022-11-06 MED ORDER — ATORVASTATIN CALCIUM 40 MG PO TABS: 40.0000 mg | ORAL_TABLET | Freq: Every day | ORAL | 3 refills | Status: AC

## 2022-11-06 MED ORDER — GLUCOSE 40 % PO GEL
ORAL | Status: AC
  Filled 2022-11-06: qty 1.21

## 2022-11-06 MED ORDER — ACETAMINOPHEN 325 MG PO TABS: 650.0000 mg | ORAL_TABLET | Freq: Four times a day (QID) | ORAL | 0 refills | Status: DC | PRN

## 2022-11-06 MED ORDER — GLIPIZIDE 5 MG PO TABS: 5.0000 mg | ORAL_TABLET | Freq: Two times a day (BID) | ORAL | 2 refills | Status: DC

## 2022-11-06 MED ORDER — ASPIRIN 81 MG PO TBEC: 81.0000 mg | DELAYED_RELEASE_TABLET | Freq: Every day | ORAL | 2 refills | Status: AC

## 2022-11-06 MED ORDER — TECHNETIUM TC 99M TETROFOSMIN IV KIT
30.0000 | PACK | Freq: Once | INTRAVENOUS | Status: AC | PRN
  Administered 2022-11-06: 33 via INTRAVENOUS

## 2022-11-06 MED ORDER — PANTOPRAZOLE SODIUM 40 MG PO TBEC: 40.0000 mg | DELAYED_RELEASE_TABLET | Freq: Two times a day (BID) | ORAL | 2 refills | Status: AC

## 2022-11-06 MED ORDER — SODIUM CHLORIDE FLUSH 0.9 % IV SOLN
INTRAVENOUS | Status: AC
  Administered 2022-11-06: 10 mL via INTRAVENOUS
  Filled 2022-11-06: qty 10

## 2022-11-06 MED ORDER — METOPROLOL TARTRATE 25 MG PO TABS: 25.0000 mg | ORAL_TABLET | Freq: Two times a day (BID) | ORAL | 3 refills | Status: AC

## 2022-11-06 MED ORDER — ISOSORBIDE MONONITRATE ER 30 MG PO TB24: 15.0000 mg | ORAL_TABLET | Freq: Every day | ORAL | 1 refills | Status: DC

## 2022-11-06 MED ORDER — AMIODARONE HCL 200 MG PO TABS: 200.0000 mg | ORAL_TABLET | Freq: Every day | ORAL | 3 refills | Status: DC

## 2022-11-06 MED ORDER — DAPAGLIFLOZIN PROPANEDIOL 5 MG PO TABS: 5.0000 mg | ORAL_TABLET | Freq: Every day | ORAL | 5 refills | Status: DC

## 2022-11-06 MED ORDER — TECHNETIUM TC 99M TETROFOSMIN IV KIT
10.0000 | PACK | Freq: Once | INTRAVENOUS | Status: AC | PRN
  Administered 2022-11-06: 10.6 via INTRAVENOUS

## 2022-11-06 MED ORDER — FINASTERIDE 5 MG PO TABS: 5.0000 mg | ORAL_TABLET | Freq: Every day | ORAL | 3 refills | Status: DC

## 2022-11-06 MED ORDER — REGADENOSON 0.4 MG/5ML IV SOLN
INTRAVENOUS | Status: AC
  Administered 2022-11-06: 0.4 mg via INTRAVENOUS
  Filled 2022-11-06: qty 5

## 2022-11-06 NOTE — Progress Notes (Signed)
Patient discharged with instructions given on medications and follow up visits,verbalized understanding. Prescriptions sent to Pharmacy of choice documented on AVS. IV discontinued, catheter intact. Accompanied by staff to an awaiting vehicle. 

## 2022-11-06 NOTE — Progress Notes (Signed)
*  PRELIMINARY RESULTS* Echocardiogram 2D Echocardiogram has been performed.  Angel Dixon 11/06/2022, 9:38 AM

## 2022-11-06 NOTE — Discharge Instructions (Signed)
1)Please take amiodarone as prescribed to prevent irregular heartbeat 2)Please take isosorbide/Imdur as prescribed for chest discomfort/chest pains 3)Avoid ibuprofen/Advil/Aleve/Motrin/Goody Powders/Naproxen/BC powders/Meloxicam/Diclofenac/Indomethacin and other Nonsteroidal anti-inflammatory medications as these will make you more likely to bleed and can cause stomach ulcers, can also cause Kidney problems.  4)Follow-up with cardiologist in 2 to 3 weeks as advised 5)Please note that there are multiple changes to your medications

## 2022-11-06 NOTE — Care Management Obs Status (Signed)
MEDICARE OBSERVATION STATUS NOTIFICATION   Patient Details  Name: Angel Dixon MRN: [id number] Date of Birth: [birth year]   Medicare Observation Status Notification Given:  Yes    Annice Needy, LCSW 11/06/2022, 10:31 AM

## 2022-11-06 NOTE — Discharge Summary (Signed)
Angel Dixon, is a 82 y.o. male  DOB Apr 18, 1941  MRN [id number].  Admission date:  11/05/2022  Admitting Physician  Onnie Boer, MD  Discharge Date:  11/06/2022   Primary MD  [name], MD  Recommendations for primary care physician for things to follow:  1)Please take amiodarone as prescribed to prevent irregular heartbeat 2)Please take isosorbide/Imdur as prescribed for chest discomfort/chest pains 3)Avoid ibuprofen/Advil/Aleve/Motrin/Goody Powders/Naproxen/BC powders/Meloxicam/Diclofenac/Indomethacin and other Nonsteroidal anti-inflammatory medications as these will make you more likely to bleed and can cause stomach ulcers, can also cause Kidney problems.  4)Follow-up with cardiologist in 2 to 3 weeks as advised 5)Please note that there are multiple changes to your medications  Admission Diagnosis  Atypical chest pain [R07.89] Chest pain [R07.9]   Discharge Diagnosis  Atypical chest pain [R07.89] Chest pain [R07.9]    Principal Problem:   Chest pain Active Problems:   Diabetes mellitus without complication [facility])   Essential hypertension   Symptomatic PVCs   CAD (coronary artery disease)   NSVT (nonsustained ventricular tachycardia) (HCC)      Past Medical History:  Diagnosis Date   Angina    CAD (coronary artery disease)    moderate, by cath   Diabetes mellitus    DJD (degenerative joint disease)    lumbar lam 4/05   Dyslipidemia    Dysrhythmia    "palpitation", PVCs   GERD (gastroesophageal reflux disease)    Hypertension    PUD (peptic ulcer disease)    endo 03/15/10    Past Surgical History:  Procedure Laterality Date   BACK SURGERY  2005   CARDIAC CATHETERIZATION  02/22/2006   mild obs. CAD 60-70% narrowing in small inferior branch of 1st diagonal of LAD, smooth 10% narrowing of mid RCA (Dr. 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 Jonette Eva)   CARDIAC CATHETERIZATION  05/11/2011   nonobstructive CAD (Dr. Ranae Palms)   COLONOSCOPY N/A 06/13/2016   Procedure: COLONOSCOPY;  Surgeon: Malissa Hippo, MD;  Location[facility];  Service: Endoscopy;  Laterality: N/A;  1200   HERNIA REPAIR     INGUINAL HERNIA REPAIR  1982   right   LEFT HEART CATHETERIZATION WITH CORONARY ANGIOGRAM N/A 05/14/2011   Procedure: LEFT HEART CATHETERIZATION WITH CORONARY ANGIOGRAM;  Surgeon: Marykay Lex, MD;  Location[facility] C[facility] LAB;  Service: Cardiovascular;  Laterality: N/A;  Coronary angiogram and possible PCI   LUMBAR LAMINECTOMY  09/2003   L2-3, 3-4, 4-5   NM MYOCAR PERF WALL MOTION  10/03/2009   bruce myoview - normal perfusion in all regions, EF 70%, no ischemia, low risk   TRANSTHORACIC ECHOCARDIOGRAM  03/28/2012   EF 65-70%, grade 1 diastolic dysfunction, mod AV calcification, mild MR     HPI  from the history and physical done on the day of admission:   Chief Complaint: Chest Pain   HPI: Angel Dixon is a 82 y.o. male with medical history  significant for  CAD, DM, HTN, BPH.  Presented to the ED with complaints of central chest tightness radiating to the left side of his chest that yesterday, he was working on regarding when they started.  Reports associated difficulty breathing or lightheadedness.  He reports over the past 2 days he has been having difficulty breathing with exertion. No lower extremity swelling  He was given nitro in the ED which helped with his chest pain.   ED Course: Stable Vitals.  Troponin 5 x 2.  EKG without acute changes .  Cardiologist consulted, will see in consult.   Review of Systems: As per HPI all other systems reviewed and negative.    Hospital Course:     1)Chest pain Cath in 2012 non obstructive CAD Myovue March 2022 non ischemic normal EF.  EKG, troponins unremarkable. -Ruled out for ACS by EKG and troponins -Cardiology  consult appreciated -Echocardiogram on 11/06/2022 with preserved EF of 55% with grade 1 diastolic dysfunction and no regional wall motion abnormalities -Lexiscan nuclear stress test without reversible ischemia-no significant arrhythmia noted, study is considered low risk -Continue metoprolol and isosorbide, aspirin and atorvastatin -Follow-up cardiologist as advised   2)HTN Stable. -Resume Norvasc, metoprolol, losartan   3)Diabetes mellitus without complication (HCC) -Patient had episode of hypoglycemia while n.p.o.  -No recent A1c, okay to restart Farxiga, glipizide, Januvia   4)Symptomatic PVCs Follows with Dr. Ladona Ridgel, -Resume amiodarone and metoprolol  Discharge Condition: Stable Follow UP   Follow-up Information     [facility]. Schedule an appointment as soon as possible for a visit in 3 day(s).   Specialty: Cardiology Contact information: [address] [id number] mc [address] [place] [address] [phone]                 Consults obtained -cardiology  Diet and Activity recommendation:  As advised  Discharge Instructions    Discharge Instructions     Call MD for:  difficulty breathing, headache or visual disturbances   Complete by: As directed    Call MD for:  persistant dizziness or light-headedness   Complete by: As directed    Call MD for:  temperature >100.4   Complete by: As directed    Diet - low sodium heart healthy   Complete by: As directed    Diet Carb Modified   Complete by: As directed    Discharge instructions   Complete by: As directed    1)Please take amiodarone as prescribed to prevent irregular heartbeat 2)Please take isosorbide/Imdur as prescribed for chest discomfort/chest pains 3)Avoid ibuprofen/Advil/Aleve/Motrin/Goody Powders/Naproxen/BC powders/Meloxicam/Diclofenac/Indomethacin and other Nonsteroidal anti-inflammatory medications as these will make you more likely to  bleed and can cause stomach ulcers, can also cause Kidney problems.  4)Follow-up with cardiologist in 2 to 3 weeks as advised 5)Please note that there are multiple changes to your medications   Increase activity slowly   Complete by: As directed        Discharge Medications     Allergies as of 11/06/2022       Reactions   Lisinopril Other (See Comments)   Sore throat, no obvious angioedema or cough   Sulfa Antibiotics Hives   Sulfacetamide Hives   Vioxx [rofecoxib] Hives   Yellow Jacket Venom [bee Venom] Swelling        Medication List     STOP taking these medications    amLODipine 5 MG tablet Commonly known as: NORVASC   ibuprofen 800 MG tablet Commonly known  as: ADVIL   losartan 25 MG tablet Commonly known as: COZAAR       TAKE these medications    acetaminophen 325 MG tablet Commonly known as: TYLENOL Take 2 tablets (650 mg total) by mouth every 6 (six) hours as needed for mild pain (or Fever >/= 101).   amiodarone 200 MG tablet Commonly known as: PACERONE Take 1 tablet (200 mg total) by mouth daily.   aspirin EC 81 MG tablet Take 1 tablet (81 mg total) by mouth daily with breakfast. Swallow whole. What changed: when to take this   atorvastatin 40 MG tablet Commonly known as: LIPITOR Take 1 tablet (40 mg total) by mouth daily.   BLINK TEARS OP Place 1 drop into both eyes 2 (two) times daily.   cetirizine 10 MG tablet Commonly known as: ZYRTEC Take 10 mg by mouth daily as needed for allergies.   dapagliflozin propanediol 5 MG Tabs tablet Commonly known as: Farxiga Take 1 tablet (5 mg total) by mouth daily.   finasteride 5 MG tablet Commonly known as: PROSCAR Take 1 tablet (5 mg total) by mouth daily.   fluticasone 50 MCG/ACT nasal spray Commonly known as: FLONASE Place 1 spray into both nostrils as needed for allergies or rhinitis.   glipiZIDE 5 MG tablet Commonly known as: GLUCOTROL Take 1 tablet (5 mg total) by mouth 2 (two) times  daily with a meal. What changed: when to take this   isosorbide mononitrate 30 MG 24 hr tablet Commonly known as: IMDUR Take 0.5 tablets (15 mg total) by mouth daily.   metoprolol tartrate 25 MG tablet Commonly known as: LOPRESSOR Take 1 tablet (25 mg total) by mouth 2 (two) times daily.   mirabegron ER 25 MG Tb24 tablet Commonly known as: MYRBETRIQ Take 1 tablet (25 mg total) by mouth daily.   nitroGLYCERIN 0.4 MG SL tablet Commonly known as: NITROSTAT Place 1 tablet (0.4 mg total) under the tongue every 5 (five) minutes as needed for chest pain (severe chest pain or pressure only).   NovoLIN N 100 UNIT/ML injection Generic drug: insulin NPH Human Inject 10 Units into the skin 2 (two) times daily before a meal.   OneTouch Ultra test strip Generic drug: glucose blood USE 1 STRIP TO CHECK BLOOD GLUCOSE LEVEL TWICE DAILY.   pantoprazole 40 MG tablet Commonly known as: PROTONIX Take 1 tablet (40 mg total) by mouth 2 (two) times daily.   silodosin 8 MG Caps capsule Commonly known as: RAPAFLO Take 1 capsule (8 mg total) by mouth at bedtime.   sitaGLIPtin 100 MG tablet Commonly known as: JANUVIA Take 100 mg by mouth daily.       Major procedures and Radiology Reports - PLEASE review detailed and final reports for all details, in brief -   ECHOCARDIOGRAM COMPLETE  Result Date: 11/06/2022    ECHOCARDIOGRAM REPORT   Patient Name:   TELESFORO CAMPE Date of Exam: 11/06/2022 Medical Rec #:  [id number]          Height:       68.0 in Accession #:    [id number]         Weight:       140.9 lb Date of Birth:  1940/09/21           BSA:          1.761 m Patient Age:    82 years           BP:  162/81 mmHg Patient Gender: M                  HR:           78 bpm. [facility] Procedure: 2D Echo, Cardiac Doppler and Color Doppler Indications:    Chest Pain R07.9  History:        Patient has prior history of Echocardiogram examinations, most                 recent  10/26/2020. CAD, Arrythmias:PVC; Risk                 Factors:Hypertension, Diabetes and Dyslipidemia. Angina (From                 Hx).  Sonographer:    Celesta Gentile RCS Referring Phys: [phone] Heloise Beecham Kairos Panetta IMPRESSIONS  1. Left ventricular ejection fraction, by estimation, is >75%. The left ventricle has hyperdynamic function. The left ventricle has no regional wall motion abnormalities. Left ventricular diastolic parameters are consistent with Grade I diastolic dysfunction (impaired relaxation).  2. Right ventricular systolic function is hyperdynamic. The right ventricular size is normal. Tricuspid regurgitation signal is inadequate for assessing PA pressure.  3. The mitral valve is grossly normal. No evidence of mitral valve regurgitation. No evidence of mitral stenosis.  4. The aortic valve is tricuspid. There is mild calcification of the aortic valve. Aortic valve regurgitation is not visualized. No aortic stenosis is present.  5. The inferior vena cava is normal in size with greater than 50% respiratory variability, suggesting right atrial pressure of 3 mmHg. Comparison(s): No significant change from prior study. FINDINGS  Left Ventricle: Left ventricular ejection fraction, by estimation, is >75%. The left ventricle has hyperdynamic function. The left ventricle has no regional wall motion abnormalities. The left ventricular internal cavity size was normal in size. There is no left ventricular hypertrophy. Left ventricular diastolic parameters are consistent with Grade I diastolic dysfunction (impaired relaxation). Right Ventricle: The right ventricular size is normal. No increase in right ventricular wall thickness. Right ventricular systolic function is hyperdynamic. Tricuspid regurgitation signal is inadequate for assessing PA pressure. Left Atrium: Left atrial size was normal in size. Right Atrium: Right atrial size was normal in size. Pericardium: Trivial pericardial effusion is present. Mitral Valve:  The mitral valve is grossly normal. There is mild calcification of the mitral valve leaflet(s). No evidence of mitral valve regurgitation. No evidence of mitral valve stenosis. Tricuspid Valve: The tricuspid valve is normal in structure. Tricuspid valve regurgitation is not demonstrated. No evidence of tricuspid stenosis. Aortic Valve: The aortic valve is tricuspid. There is mild calcification of the aortic valve. Aortic valve regurgitation is not visualized. No aortic stenosis is present. Pulmonic Valve: The pulmonic valve was not well visualized. Pulmonic valve regurgitation is trivial. No evidence of pulmonic stenosis. Aorta: The aortic root is normal in size and structure. Venous: The inferior vena cava is normal in size with greater than 50% respiratory variability, suggesting right atrial pressure of 3 mmHg. IAS/Shunts: No atrial level shunt detected by color flow Doppler.  LEFT VENTRICLE PLAX 2D LVIDd:         3.80 cm   Diastology LVIDs:         2.70 cm   LV e' medial:    6.31 cm/s LV PW:         1.10 cm   LV E/e' medial:  11.4 LV IVS:        1.10  cm   LV e' lateral:   7.40 cm/s LVOT diam:     1.90 cm   LV E/e' lateral: 9.7 LV SV:         79 LV SV Index:   45 LVOT Area:     2.84 cm  RIGHT VENTRICLE RV S prime:     16.90 cm/s TAPSE (M-mode): 2.9 cm LEFT ATRIUM             Index        RIGHT ATRIUM           Index LA diam:        2.80 cm 1.59 cm/m   RA Area:     15.30 cm LA Vol (A2C):   61.3 ml 34.81 ml/m  RA Volume:   44.50 ml  25.27 ml/m LA Vol (A4C):   35.5 ml 20.16 ml/m LA Biplane Vol: 48.0 ml 27.26 ml/m  AORTIC VALVE LVOT Vmax:   146.00 cm/s LVOT Vmean:  89.400 cm/s LVOT VTI:    0.279 m  AORTA Ao Root diam: 3.00 cm MITRAL VALVE MV Area (PHT): 2.16 cm     SHUNTS MV Decel Time: 352 msec     Systemic VTI:  0.28 m MV E velocity: 72.00 cm/s   Systemic Diam: 1.90 cm MV A velocity: 104.00 cm/s MV E/A ratio:  0.69 Vishnu Priya Mallipeddi Electronically signed by Winfield Rast Mallipeddi Signature Date/Time:  11/06/2022/3:41:17 PM    Final    NM Myocar Multi W/Spect Izetta Dakin Motion / EF  Result Date: 11/06/2022   Stress ECG is negative for ischemia and arrhythmias.   LV perfusion is normal. There is no evidence of ischemia. There is no evidence of infarction.   Left ventricular function is normal. Nuclear stress EF: 68 %.   The study is normal. The study is low risk.   DG Chest 2 View  Result Date: 11/05/2022 CLINICAL DATA:  Chest pain and shortness of breath. EXAM: CHEST - 2 VIEW COMPARISON:  09/26/2022 prior radiographs FINDINGS: Telemetry leads overlie the chest. The cardiomediastinal silhouette is unremarkable. There is no evidence of focal airspace disease, pulmonary edema, suspicious pulmonary nodule/mass, pleural effusion, or pneumothorax. No acute bony abnormalities are identified. IMPRESSION: No active cardiopulmonary disease. Electronically Signed   By: Harmon Pier M.D.   On: 11/05/2022 14:37    Today   Subjective    Joeziah Mcgarrity today has no new complaints No fever  Or chills   No Nausea, Vomiting or Diarrhea Patient's son at bedside, questions answered  Patient has been seen and examined prior to discharge   Objective   Blood pressure 109/69, pulse (!) 54, temperature 98.2 F (36.8 C), temperature source Oral, resp. rate 18, height 5\' 8"  (1.727 m), weight 63.9 kg, SpO2 99 %.   Intake/Output Summary (Last 24 hours) at 11/06/2022 1611 Last data filed at 11/06/2022 1300 Gross per 24 hour  Intake 480 ml  Output --  Net 480 ml    Exam Gen:- Awake Alert, no acute distress  HEENT:- [place].AT, No sclera icterus Neck-Supple Neck,No JVD,.  Lungs-  CTAB , good air movement bilaterally CV- S1, S2 normal, regular Abd-  +ve B.Sounds, Abd Soft, No tenderness,    Extremity/Skin:- No  edema,   good pulses Psych-affect is appropriate, oriented x3 Neuro-no new focal deficits, no tremors    Data Review   CBC w Diff:  Lab Results  Component Value Date   WBC 6.2 11/05/2022   HGB 12.3 (L)  11/05/2022  HCT 37.4 (L) 11/05/2022   PLT 277 11/05/2022   LYMPHOPCT 30 03/27/2012   MONOPCT 9 03/27/2012   EOSPCT 4 03/27/2012   BASOPCT 1 03/27/2012    CMP:  Lab Results  Component Value Date   NA 136 11/05/2022   K 3.8 11/05/2022   CL 99 11/05/2022   CO2 27 11/05/2022   BUN 19 11/05/2022   CREATININE 1.21 11/05/2022   PROT 7.4 11/05/2022   ALBUMIN 3.9 11/05/2022   BILITOT 1.7 (H) 11/05/2022   ALKPHOS 91 11/05/2022   AST 33 11/05/2022   ALT 54 (H) 11/05/2022  .  Total Discharge time is about 33 minutes  Shon Hale M.D on 11/06/2022 at 4:11 PM  Go to [email or web address] -  for contact info  [facility] - Office  [phone]

## 2022-11-06 NOTE — TOC Initial Note (Signed)
Transition of Care [facility]) - Initial/Assessment Note    Patient Details  Name: Angel Dixon MRN: [id number] Date of Birth: 05-13-1941  Transition of Care [facility]) CM/SW Contact:    Annice Needy, LCSW Phone Number: 11/06/2022, 10:33 AM  Clinical Narrative:                 Patient from home with spouse. In observation for chest pain. At baseline, he is independent with ADLs, ambulates independently, drives. Daughter is there often with patient and spouse.   Expected Discharge Plan: Home/Self Care Barriers to Discharge: Continued Medical Work up   Patient Goals and CMS Choice Patient states their goals for this hospitalization and ongoing recovery are:: return home          Expected Discharge Plan and Services       Living arrangements for the past 2 months: Single Family Home                                      Prior Living Arrangements/Services Living arrangements for the past 2 months: Single Family Home Lives with:: Spouse Patient language and need for interpreter reviewed:: Yes        Need for Family Participation in Patient Care: Yes (Comment) Care giver support system in place?: Yes (comment)   Criminal Activity/Legal Involvement Pertinent to Current Situation/Hospitalization: No - Comment as needed  Activities of Daily Living Home Assistive Devices/Equipment: CBG Meter ADL Screening (condition at time of admission) Patient's cognitive ability adequate to safely complete daily activities?: Yes Is the patient deaf or have difficulty hearing?: No Does the patient have difficulty seeing, even when wearing glasses/contacts?: No Does the patient have difficulty concentrating, remembering, or making decisions?: No Patient able to express need for assistance with ADLs?: Yes Does the patient have difficulty dressing or bathing?: No Independently performs ADLs?: Yes (appropriate for developmental age) Communication: Independent Dressing (OT):  Independent Grooming: Independent Feeding: Independent Bathing: Independent Toileting: Independent In/Out Bed: Independent Walks in Home: Independent Does the patient have difficulty walking or climbing stairs?: No Weakness of Legs: None Weakness of Arms/Hands: None  Permission Sought/Granted Permission sought to share information with : Family Supports    Share Information with NAME: spouse, Engineer, site           Emotional Assessment         Alcohol / Substance Use: Not Applicable Psych Involvement: No (comment)  Admission diagnosis:  Atypical chest pain [R07.89] Chest pain [R07.9] Patient Active Problem List   Diagnosis Date Noted   Macular degeneration 08/02/2022   Dysphagia 03/12/2022   Urinary frequency 12/13/2020   Hyperglycemia due to diabetes mellitus (HCC) 10/25/2020   GERD (gastroesophageal reflux disease) 10/25/2020   Benign prostatic hyperplasia with urinary obstruction 10/25/2020   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 09/08/2020   Dyslipidemia, goal LDL below 70 08/28/2017   Degeneration of lumbar intervertebral disc 06/22/2017   Lumbar post-laminectomy syndrome 06/22/2017   Low back pain 06/11/2017   Pseudophakia 08/07/2016   Atypical chest pain 07/06/2016   History of colonic polyps 03/14/2016   NSVT (nonsustained ventricular tachycardia) (HCC) 04/02/2014   Chest pain 03/28/2012   Palpitations 05/13/2011   Symptomatic PVCs 05/13/2011   Acute coronary syndrome (HCC) 05/11/2011   CAD (coronary artery disease) 05/11/2011   Diabetes mellitus without complication (HCC) 05/11/2011   Essential hypertension 05/11/2011   Dyslipidemia 05/11/2011  Family history of early CAD 05/11/2011   PCP:  [name], MD Pharmacy:   [facility] [phone] - [place], [place] - [address] AT [facility] [address] [place] [address] Phone: [phone] Fax:  [phone]     Social Determinants of Health (SDOH) Social History: SDOH Screenings   Food Insecurity: No Food Insecurity (11/05/2022)  Housing: Low Risk  (11/05/2022)  Transportation Needs: No Transportation Needs (11/05/2022)  Utilities: Not At Risk (11/05/2022)  Tobacco Use: Medium Risk (11/06/2022)   SDOH Interventions:     Readmission Risk Interventions     No data to display

## 2022-11-06 NOTE — Progress Notes (Signed)
Rounding Note    Patient Name: Angel Dixon Date of Encounter: 11/06/2022  [facility] Cardiologist: Chrystie Nose, MD   Subjective   Patient seen on rounds.  Denies any current chest pain or shortness of breath.  Is scheduled for Lexiscan Myoview this morning.  Inpatient Medications    Scheduled Meds:  aspirin EC  81 mg Oral Daily   atorvastatin  40 mg Oral Daily   enoxaparin (LOVENOX) injection  40 mg Subcutaneous Q24H   finasteride  5 mg Oral Daily   insulin aspart  0-15 Units Subcutaneous TID WC   insulin aspart  0-5 Units Subcutaneous QHS   isosorbide mononitrate  15 mg Oral Daily   metoprolol tartrate  25 mg Oral BID   pantoprazole  40 mg Oral BID   Continuous Infusions:  PRN Meds: acetaminophen **OR** acetaminophen, nitroGLYCERIN, polyethylene glycol, technetium tetrofosmin   Vital Signs    Vitals:   11/05/22 1738 11/05/22 2033 11/06/22 0125 11/06/22 0428  BP: (!) 161/90 133/88 129/85 (!) 159/94  Pulse: 73 70 (!) 57 (!) 56  Resp: 17 20  14   Temp: 97.7 F (36.5 C) 97.6 F (36.4 C) 97.7 F (36.5 C) 98.6 F (37 C)  TempSrc: Oral Oral Oral   SpO2: 100% 99% 99% 100%  Weight: 63.9 kg     Height: 5\' 8"  (1.727 m)       Intake/Output Summary (Last 24 hours) at 11/06/2022 0917 Last data filed at 11/06/2022 0500 Gross per 24 hour  Intake 240 ml  Output --  Net 240 ml      11/05/2022    5:38 PM 11/05/2022    1:42 PM 08/28/2022    2:38 PM  Last 3 Weights  Weight (lbs) 140 lb 14 oz 142 lb 148 lb 3.2 oz  Weight (kg) 63.9 kg 64.411 kg 67.223 kg      Telemetry    Sinus bradycardia to sinus rhythm with occasional unifocal PVCs rates of 50s and 60s- Personally Reviewed  ECG    No new tracings completed this morning- Personally Reviewed  Physical Exam   GEN: No acute distress.   Neck: No JVD Cardiac: RRR, II/VI systolic murmur, without rubs or gallops.  Respiratory: Clear to auscultation bilaterally. GI: Soft, nontender, non-distended   MS: No edema; No deformity. Neuro:  Nonfocal  Psych: Normal affect   Labs    High Sensitivity Troponin:   Recent Labs  Lab 11/05/22 1357 11/05/22 1534  TROPONINIHS 5 5     Chemistry Recent Labs  Lab 11/05/22 1357 11/05/22 1534  NA 136  --   K 3.8  --   CL 99  --   CO2 27  --   GLUCOSE 233*  --   BUN 19  --   CREATININE 1.21  --   CALCIUM 8.9  --   MG  --  2.1  PROT 7.4  --   ALBUMIN 3.9  --   AST 33  --   ALT 54*  --   ALKPHOS 91  --   BILITOT 1.7*  --   GFRNONAA 60*  --   ANIONGAP 10  --     Lipids No results for input(s): "CHOL", "TRIG", "HDL", "LABVLDL", "LDLCALC", "CHOLHDL" in the last 168 hours.  Hematology Recent Labs  Lab 11/05/22 1357  WBC 6.2  RBC 4.28  HGB 12.3*  HCT 37.4*  MCV 87.4  MCH 28.7  MCHC 32.9  RDW 12.5  PLT 277   Thyroid  No results for input(s): "TSH", "FREET4" in the last 168 hours.  BNPNo results for input(s): "BNP", "PROBNP" in the last 168 hours.  DDimer No results for input(s): "DDIMER" in the last 168 hours.   Radiology    DG Chest 2 View  Result Date: 11/05/2022 CLINICAL DATA:  Chest pain and shortness of breath. EXAM: CHEST - 2 VIEW COMPARISON:  09/26/2022 prior radiographs FINDINGS: Telemetry leads overlie the chest. The cardiomediastinal silhouette is unremarkable. There is no evidence of focal airspace disease, pulmonary edema, suspicious pulmonary nodule/mass, pleural effusion, or pneumothorax. No acute bony abnormalities are identified. IMPRESSION: No active cardiopulmonary disease. Electronically Signed   By: Harmon Pier M.D.   On: 11/05/2022 14:37    Cardiac Studies   Echocardiogram and Myoview pending  Patient Profile     82 y.o. male with a history of monomorphic PVCs, coronary artery disease, stable angina, diabetes, DJD, dyslipidemia, GERD, palpitations, essential hypertension, who has been seen and evaluated for atypical chest pain.  Assessment & Plan    Atypical chest pain -Presented with atypical chest  discomfort -High-sensitivity troponin negative x 2 -No ischemic changes noted on EKG -Chest pain-free on exam this morning -Echocardiogram and Lexiscan Myoview pending with further recommendations to follow -EKG as needed for pain or changes  Coronary artery disease -Cath in 2012 showed nonobstructive CAD -Recent Myoview testing in March 2022 showed no ischemic -Has occasional chest tightness with the yard work and palpitations -He is continued on aspirin, statin, and Imdur -Continue with telemetry monitoring -No ischemic changes noted thus far  PVCs -Longstanding history of monomorphic PVCs -Previously has been on amiodarone low-dose -Continues to be followed by EP -Restarting amiodarone 200 mg daily  Essential hypertension -Blood pressure 159/94 -Continued on metoprolol tartrate 25 mg twice daily and Imdur -Recommend restarting PTA medications of losartan and amlodipine -Vital signs per unit protocol  Dyslipidemia -Continue on atorvastatin 40 mg daily  Type 2 diabetes -Continued on insulin therapy Management per IM     For questions or updates, please contact [facility] Please consult [email or web address] for contact info under        Signed, Estha Few, NP  11/06/2022, 9:17 AM

## 2022-11-07 LAB — HEMOGLOBIN A1C
Hgb A1c MFr Bld: 12 % — ABNORMAL HIGH (ref 4.8–5.6)
Mean Plasma Glucose: 298 mg/dL

## 2022-12-04 ENCOUNTER — Encounter: Payer: Self-pay | Admitting: General Surgery

## 2022-12-04 ENCOUNTER — Other Ambulatory Visit: Payer: Self-pay

## 2022-12-04 ENCOUNTER — Ambulatory Visit: Payer: Medicare HMO | Admitting: General Surgery

## 2022-12-04 ENCOUNTER — Telehealth: Payer: Self-pay

## 2022-12-04 ENCOUNTER — Encounter: Payer: Self-pay | Admitting: *Deleted

## 2022-12-04 VITALS — BP 147/76 | HR 62 | Temp 98.3°F | Resp 19 | Ht 68.0 in | Wt 142.0 lb

## 2022-12-04 DIAGNOSIS — K4090 Unilateral inguinal hernia, without obstruction or gangrene, not specified as recurrent: Secondary | ICD-10-CM | POA: Diagnosis not present

## 2022-12-04 NOTE — Telephone Encounter (Signed)
Spoke with patient who is agreeable to see Caryl Ada, PA-C on 7/11 at 1:55 pm. I will faxed clearance updates to requesting office.

## 2022-12-04 NOTE — H&P (Signed)
Angel Dixon; [id number]; Nov 08, 1940   HPI Patient is an 82 year old black male who returns back to my care for evaluation and treatment of his left inguinal hernia.  He has seen his cardiologist who after examination determined that he was stable from a cardiac standpoint.  His left groin pain is worsening and he would like to proceed with repair of his left inguinal hernia. Past Medical History:  Diagnosis Date   Angina    CAD (coronary artery disease)    moderate, by cath   Diabetes mellitus    DJD (degenerative joint disease)    lumbar lam 4/05   Dyslipidemia    Dysrhythmia    "palpitation", PVCs   GERD (gastroesophageal reflux disease)    Hypertension    PUD (peptic ulcer disease)    endo 03/15/10    Past Surgical History:  Procedure Laterality Date   BACK SURGERY  2005   CARDIAC CATHETERIZATION  02/22/2006   mild obs. CAD 60-70% narrowing in small inferior branch of 1st diagonal of LAD, smooth 10% narrowing of mid RCA (Dr. 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 Jonette Eva)   CARDIAC CATHETERIZATION  05/11/2011   nonobstructive CAD (Dr. Ranae Palms)   COLONOSCOPY N/A 06/13/2016   Procedure: COLONOSCOPY;  Surgeon: Malissa Hippo, MD;  Location: [facility];  Service: Endoscopy;  Laterality: N/A;  1200   HERNIA REPAIR     INGUINAL HERNIA REPAIR  1982   right   LEFT HEART CATHETERIZATION WITH CORONARY ANGIOGRAM N/A 05/14/2011   Procedure: LEFT HEART CATHETERIZATION WITH CORONARY ANGIOGRAM;  Surgeon: Marykay Lex, MD;  Location: [facility] CATH LAB;  Service: Cardiovascular;  Laterality: N/A;  Coronary angiogram and possible PCI   LUMBAR LAMINECTOMY  09/2003   L2-3, 3-4, 4-5   NM MYOCAR PERF WALL MOTION  10/03/2009   bruce myoview - normal perfusion in all regions, EF 70%, no ischemia, low risk   TRANSTHORACIC ECHOCARDIOGRAM  03/28/2012   EF 65-70%, grade 1 diastolic dysfunction, mod AV  calcification, mild MR    Family History  Problem Relation Age of Onset   Kidney failure Mother        died 40   Coronary artery disease Father        died 74   Heart attack Sister        died of MI in her 34s   Sarcoidosis Daughter        died 6    Current Outpatient Medications on File Prior to Visit  Medication Sig Dispense Refill   acetaminophen (TYLENOL) 325 MG tablet Take 2 tablets (650 mg total) by mouth every 6 (six) hours as needed for mild pain (or Fever >/= 101). 100 tablet 0   amiodarone (PACERONE) 200 MG tablet Take 1 tablet (200 mg total) by mouth daily. 90 tablet 3   aspirin EC 81 MG tablet Take 1 tablet (81 mg total) by mouth daily with breakfast. Swallow whole. 90 tablet 2   atorvastatin (LIPITOR) 40 MG tablet Take 1 tablet (40 mg total) by mouth daily. 90 tablet 3   cetirizine (ZYRTEC) 10 MG tablet Take 10 mg by mouth daily as needed for allergies.     dapagliflozin propanediol (FARXIGA) 5 MG TABS tablet Take 1 tablet (5 mg total) by mouth daily. 30 tablet 5   finasteride (PROSCAR) 5 MG tablet Take 1 tablet (5 mg total) by mouth daily. 90 tablet  3   fluticasone (FLONASE) 50 MCG/ACT nasal spray Place 1 spray into both nostrils as needed for allergies or rhinitis.   11   glipiZIDE (GLUCOTROL) 5 MG tablet Take 1 tablet (5 mg total) by mouth 2 (two) times daily with a meal. 180 tablet 2   isosorbide mononitrate (IMDUR) 30 MG 24 hr tablet Take 0.5 tablets (15 mg total) by mouth daily. 45 tablet 1   metoprolol tartrate (LOPRESSOR) 25 MG tablet Take 1 tablet (25 mg total) by mouth 2 (two) times daily. 180 tablet 3   mirabegron ER (MYRBETRIQ) 25 MG TB24 tablet Take 1 tablet (25 mg total) by mouth daily. 30 tablet 11   nitroGLYCERIN (NITROSTAT) 0.4 MG SL tablet Place 1 tablet (0.4 mg total) under the tongue every 5 (five) minutes as needed for chest pain (severe chest pain or pressure only). 25 tablet 3   NOVOLIN N 100 UNIT/ML injection Inject 10 Units into the skin 2 (two)  times daily before a meal.     ONETOUCH ULTRA test strip USE 1 STRIP TO CHECK BLOOD GLUCOSE LEVEL TWICE DAILY.     pantoprazole (PROTONIX) 40 MG tablet Take 1 tablet (40 mg total) by mouth 2 (two) times daily. 60 tablet 2   Polyethylene Glycol 400 (BLINK TEARS OP) Place 1 drop into both eyes 2 (two) times daily.     silodosin (RAPAFLO) 8 MG CAPS capsule Take 1 capsule (8 mg total) by mouth at bedtime. 90 capsule 3   No current facility-administered medications on file prior to visit.    Allergies  Allergen Reactions   Lisinopril Other (See Comments)    Sore throat, no obvious angioedema or cough   Sulfa Antibiotics Hives   Sulfacetamide Hives   Vioxx [Rofecoxib] Hives   Yellow Jacket Venom [Bee Venom] Swelling    Social History   Substance and Sexual Activity  Alcohol Use Yes   Comment: 05/11/11 "shot every now and then"    Social History   Tobacco Use  Smoking Status Former   Packs/day: 1.00   Years: 25.00   Additional pack years: 0.00   Total pack years: 25.00   Types: Cigarettes  Smokeless Tobacco Never  Tobacco Comments   quit smoking cigarettes ~ 1990    Review of Systems  Constitutional:  Positive for malaise/fatigue.  HENT: Negative.    Eyes: Negative.   Respiratory: Negative.    Cardiovascular: Negative.   Gastrointestinal:  Positive for abdominal pain.  Genitourinary: Negative.   Musculoskeletal:  Positive for back pain.  Skin: Negative.   Neurological: Negative.   Endo/Heme/Allergies: Negative.   Psychiatric/Behavioral: Negative.      Objective   Vitals:   12/04/22 0954  BP: (!) 147/76  Pulse: 62  Resp: 19  Temp: 98.3 F (36.8 C)  SpO2: 97%    Physical Exam Vitals reviewed.  Constitutional:      Appearance: Normal appearance. He is normal weight. He is not ill-appearing.  HENT:     Head: Normocephalic and atraumatic.  Cardiovascular:     Rate and Rhythm: Normal rate and regular rhythm.     Heart sounds: Normal heart sounds. No murmur  heard.    No friction rub. No gallop.  Pulmonary:     Effort: Pulmonary effort is normal. No respiratory distress.     Breath sounds: Normal breath sounds. No stridor. No wheezing, rhonchi or rales.  Abdominal:     General: Abdomen is flat. Bowel sounds are normal. There is no distension.  Palpations: Abdomen is soft. There is no mass.     Tenderness: There is no abdominal tenderness. There is no guarding or rebound.     Hernia: A hernia is present.     Comments: Easily reducible left inguinal hernia.  No right inguinal hernia.  Genitourinary:    Testes: Normal.  Skin:    General: Skin is warm and dry.  Neurological:     Mental Status: He is alert and oriented to person, place, and time.    Cardiology notes reviewed Assessment  Left inguinal hernia Plan  Patient is scheduled for robotic assisted laparoscopic left inguinal herniorrhaphy with mesh on 12/20/2022.  The risks and benefits of the procedure including bleeding, infection, mesh use, cardiopulmonary difficulties, and the possibility of recurrence of the hernia were fully explained to the patient, who gave informed consent.  He should stop his [place] 3 days before the surgery.

## 2022-12-04 NOTE — Telephone Encounter (Signed)
   Pre-operative Risk Assessment    Patient Name: Angel Dixon  DOB: [birth year] MRN: [id number]      Request for Surgical Clearance    Procedure:   XI ROBOTIC ASSISTED INGUINAL HERNIA REPAIR WITH MESH, LEFT   Date of Surgery:  Clearance 12/20/22                                 Surgeon: Franky Macho    Surgeon's Group or Practice Name:  [facility] Phone number:  [phone] Fax number:  [phone]   Type of Clearance Requested:   - Pharmacy:  Hold Aspirin FARXIGA - OK TO HOLD FOR 72 HOURS    Type of Anesthesia:  General    Additional requests/questions:    SignedMichaelle Copas   12/04/2022, 2:03 PM

## 2022-12-04 NOTE — Telephone Encounter (Addendum)
   Name: Angel Dixon  DOB: 12-Apr-1941  MRN: [id number]  Primary Cardiologist: Chrystie Nose, MD  Chart reviewed as part of pre-operative protocol coverage. Because of Angel Dixon's past medical history and time since last visit, he will require a follow-up in-office visit in order to better assess preoperative cardiovascular risk.  Patient had recent hospital visit for irregular heartbeat and should be seen in office prior to clearance being granted.  Pre-op covering staff: - Please schedule appointment and call patient to inform them. If patient already had an upcoming appointment within acceptable timeframe, please add "pre-op clearance" to the appointment notes so provider is aware. - Please contact requesting surgeon's office via preferred method (i.e, phone, fax) to inform them of need for appointment prior to surgery.  ASA can be addressed at follow up visit.  Napoleon Form, Leodis Rains, NP  12/04/2022, 2:46 PM

## 2022-12-04 NOTE — Progress Notes (Signed)
Angel Dixon; [id number]; 01/12/1941   HPI Patient is an 82-year-old black male who returns back to my care for evaluation and treatment of his left inguinal hernia.  He has seen his cardiologist who after examination determined that he was stable from a cardiac standpoint.  His left groin pain is worsening and he would like to proceed with repair of his left inguinal hernia. Past Medical History:  Diagnosis Date   Angina    CAD (coronary artery disease)    moderate, by cath   Diabetes mellitus    DJD (degenerative joint disease)    lumbar lam 4/05   Dyslipidemia    Dysrhythmia    "palpitation", PVCs   GERD (gastroesophageal reflux disease)    Hypertension    PUD (peptic ulcer disease)    endo 03/15/10    Past Surgical History:  Procedure Laterality Date   BACK SURGERY  2005   CARDIAC CATHETERIZATION  02/22/2006   mild obs. CAD 60-70% narrowing in small inferior branch of 1st diagonal of LAD, smooth 10% narrowing of mid RCA (Dr. T. 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 R. Weintraub)   CARDIAC CATHETERIZATION  05/11/2011   nonobstructive CAD (Dr. D. Harding)   COLONOSCOPY N/A 06/13/2016   Procedure: COLONOSCOPY;  Surgeon: Najeeb U Rehman, MD;  Location: [facility];  Service: Endoscopy;  Laterality: N/A;  1200   HERNIA REPAIR     INGUINAL HERNIA REPAIR  1982   right   LEFT HEART CATHETERIZATION WITH CORONARY ANGIOGRAM N/A 05/14/2011   Procedure: LEFT HEART CATHETERIZATION WITH CORONARY ANGIOGRAM;  Surgeon: David W Harding, MD;  Location: [facility] CATH LAB;  Service: Cardiovascular;  Laterality: N/A;  Coronary angiogram and possible PCI   LUMBAR LAMINECTOMY  09/2003   L2-3, 3-4, 4-5   NM MYOCAR PERF WALL MOTION  10/03/2009   bruce myoview - normal perfusion in all regions, EF 70%, no ischemia, low risk   TRANSTHORACIC ECHOCARDIOGRAM  03/28/2012   EF 65-70%, grade 1 diastolic dysfunction, mod AV  calcification, mild MR    Family History  Problem Relation Age of Onset   Kidney failure Mother        died 60   Coronary artery disease Father        died 57   Heart attack Sister        died of MI in her 50s   Sarcoidosis Daughter        died 42    Current Outpatient Medications on File Prior to Visit  Medication Sig Dispense Refill   acetaminophen (TYLENOL) 325 MG tablet Take 2 tablets (650 mg total) by mouth every 6 (six) hours as needed for mild pain (or Fever >/= 101). 100 tablet 0   amiodarone (PACERONE) 200 MG tablet Take 1 tablet (200 mg total) by mouth daily. 90 tablet 3   aspirin EC 81 MG tablet Take 1 tablet (81 mg total) by mouth daily with breakfast. Swallow whole. 90 tablet 2   atorvastatin (LIPITOR) 40 MG tablet Take 1 tablet (40 mg total) by mouth daily. 90 tablet 3   cetirizine (ZYRTEC) 10 MG tablet Take 10 mg by mouth daily as needed for allergies.     dapagliflozin propanediol (FARXIGA) 5 MG TABS tablet Take 1 tablet (5 mg total) by mouth daily. 30 tablet 5   finasteride (PROSCAR) 5 MG tablet Take 1 tablet (5 mg total) by mouth daily. 90 tablet   3   fluticasone (FLONASE) 50 MCG/ACT nasal spray Place 1 spray into both nostrils as needed for allergies or rhinitis.   11   glipiZIDE (GLUCOTROL) 5 MG tablet Take 1 tablet (5 mg total) by mouth 2 (two) times daily with a meal. 180 tablet 2   isosorbide mononitrate (IMDUR) 30 MG 24 hr tablet Take 0.5 tablets (15 mg total) by mouth daily. 45 tablet 1   metoprolol tartrate (LOPRESSOR) 25 MG tablet Take 1 tablet (25 mg total) by mouth 2 (two) times daily. 180 tablet 3   mirabegron ER (MYRBETRIQ) 25 MG TB24 tablet Take 1 tablet (25 mg total) by mouth daily. 30 tablet 11   nitroGLYCERIN (NITROSTAT) 0.4 MG SL tablet Place 1 tablet (0.4 mg total) under the tongue every 5 (five) minutes as needed for chest pain (severe chest pain or pressure only). 25 tablet 3   NOVOLIN N 100 UNIT/ML injection Inject 10 Units into the skin 2 (two)  times daily before a meal.     ONETOUCH ULTRA test strip USE 1 STRIP TO CHECK BLOOD GLUCOSE LEVEL TWICE DAILY.     pantoprazole (PROTONIX) 40 MG tablet Take 1 tablet (40 mg total) by mouth 2 (two) times daily. 60 tablet 2   Polyethylene Glycol 400 (BLINK TEARS OP) Place 1 drop into both eyes 2 (two) times daily.     silodosin (RAPAFLO) 8 MG CAPS capsule Take 1 capsule (8 mg total) by mouth at bedtime. 90 capsule 3   No current facility-administered medications on file prior to visit.    Allergies  Allergen Reactions   Lisinopril Other (See Comments)    Sore throat, no obvious angioedema or cough   Sulfa Antibiotics Hives   Sulfacetamide Hives   Vioxx [Rofecoxib] Hives   Yellow Jacket Venom [Bee Venom] Swelling    Social History   Substance and Sexual Activity  Alcohol Use Yes   Comment: 05/11/11 "shot every now and then"    Social History   Tobacco Use  Smoking Status Former   Packs/day: 1.00   Years: 25.00   Additional pack years: 0.00   Total pack years: 25.00   Types: Cigarettes  Smokeless Tobacco Never  Tobacco Comments   quit smoking cigarettes ~ 1990    Review of Systems  Constitutional:  Positive for malaise/fatigue.  HENT: Negative.    Eyes: Negative.   Respiratory: Negative.    Cardiovascular: Negative.   Gastrointestinal:  Positive for abdominal pain.  Genitourinary: Negative.   Musculoskeletal:  Positive for back pain.  Skin: Negative.   Neurological: Negative.   Endo/Heme/Allergies: Negative.   Psychiatric/Behavioral: Negative.      Objective   Vitals:   12/04/22 0954  BP: (!) 147/76  Pulse: 62  Resp: 19  Temp: 98.3 F (36.8 C)  SpO2: 97%    Physical Exam Vitals reviewed.  Constitutional:      Appearance: Normal appearance. He is normal weight. He is not ill-appearing.  HENT:     Head: Normocephalic and atraumatic.  Cardiovascular:     Rate and Rhythm: Normal rate and regular rhythm.     Heart sounds: Normal heart sounds. No murmur  heard.    No friction rub. No gallop.  Pulmonary:     Effort: Pulmonary effort is normal. No respiratory distress.     Breath sounds: Normal breath sounds. No stridor. No wheezing, rhonchi or rales.  Abdominal:     General: Abdomen is flat. Bowel sounds are normal. There is no distension.       Palpations: Abdomen is soft. There is no mass.     Tenderness: There is no abdominal tenderness. There is no guarding or rebound.     Hernia: A hernia is present.     Comments: Easily reducible left inguinal hernia.  No right inguinal hernia.  Genitourinary:    Testes: Normal.  Skin:    General: Skin is warm and dry.  Neurological:     Mental Status: He is alert and oriented to person, place, and time.    Cardiology notes reviewed Assessment  Left inguinal hernia Plan  Patient is scheduled for robotic assisted laparoscopic left inguinal herniorrhaphy with mesh on 12/20/2022.  The risks and benefits of the procedure including bleeding, infection, mesh use, cardiopulmonary difficulties, and the possibility of recurrence of the hernia were fully explained to the patient, who gave informed consent.  He should stop his [place] 3 days before the surgery. 

## 2022-12-04 NOTE — Patient Instructions (Signed)
Stop Angel Dixon three days before surgery

## 2022-12-11 ENCOUNTER — Encounter (INDEPENDENT_AMBULATORY_CARE_PROVIDER_SITE_OTHER): Payer: Self-pay | Admitting: Gastroenterology

## 2022-12-11 ENCOUNTER — Ambulatory Visit (INDEPENDENT_AMBULATORY_CARE_PROVIDER_SITE_OTHER): Payer: Medicare HMO | Admitting: Gastroenterology

## 2022-12-11 VITALS — BP 177/98 | HR 63 | Temp 97.6°F | Ht 68.0 in | Wt 143.8 lb

## 2022-12-11 DIAGNOSIS — Z86010 Personal history of colonic polyps: Secondary | ICD-10-CM | POA: Diagnosis not present

## 2022-12-11 DIAGNOSIS — K921 Melena: Secondary | ICD-10-CM | POA: Diagnosis not present

## 2022-12-11 MED ORDER — PEG 3350-KCL-NA BICARB-NACL 420 G PO SOLR: 4000.0000 mL | Freq: Once | ORAL | 0 refills | Status: AC

## 2022-12-11 NOTE — Progress Notes (Addendum)
Referring Provider: [name], MD Primary Care Physician:  [name], MD Primary GI Physician: Levon Hedger   Chief Complaint  Patient presents with   dark stools    Having dark stools for past 6 -7 months.    Abdominal Pain    Having some abdominal pain. Thinks coming from hernia.    HPI:   Angel Dixon is a 82 y.o. male with past medical history of DM, CAD, HTN, GERD, HLD   Patient presenting today for dark stools  Last seen 03/2022, at that time having dysphagia, taking protonix 40mg  daily. Reported melena at that time as well.   Recommended to schedule EGD/Colonoscopy, continue PPI daily  Scheduled for TCS/EGD in 05/2022 but procedures were cancelled.  Present:  Last labs with hgb 12.3 in June, T bili 1.7 (indirect 1.5)  Patient states he had to cancel procedures in December due to his wife being sick. He notes that he has had off and on black stools. He states he has had black stools now for almost the past year. He has some lower abdominal pain but reports he has a hernia that Is in need of repair. No BRBPR. He has lost about 5-6 pounds since the end of last year, denies changes in his diet. No nausea or vomiting. No early satiety. He has rare GERD symptoms, taking protonix 40mg  daily. He has some occasional issues with food going down, will feel foods stick at sternal notch, usually can drink a big sip of liquids after bites which will help. He denies NSAIDs other than a daily baby ASA.   He has some constipation, he is taking herbalax which helps, sometimes he has to use a suppository. Having a BM maybe every 2 days.   Last Colonoscopy:2018 - Melanosis in the colon.                           - One 8 mm polyp in the ascending colon, removed                            with a hot snare. Resected and retrieved. Clip (MR                            conditional) was placed. (TA)                           - Diverticulosis in the sigmoid colon.                            - External hemorrhoids.  Last Endoscopy: 15 years ago?  Recommendations:  Repeat colonoscopy due 2023  Past Medical History:  Diagnosis Date   Angina    CAD (coronary artery disease)    moderate, by cath   Diabetes mellitus    DJD (degenerative joint disease)    lumbar lam 4/05   Dyslipidemia    Dysrhythmia    "palpitation", PVCs   GERD (gastroesophageal reflux disease)    Hypertension    PUD (peptic ulcer disease)    endo 03/15/10    Past Surgical History:  Procedure Laterality Date   BACK SURGERY  2005   CARDIAC CATHETERIZATION  02/22/2006   mild obs. CAD 60-70% narrowing in small inferior branch of 1st diagonal of LAD, smooth 10%  narrowing of mid RCA (Dr. 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 Jonette Eva)   CARDIAC CATHETERIZATION  05/11/2011   nonobstructive CAD (Dr. Ranae Palms)   COLONOSCOPY N/A 06/13/2016   Procedure: COLONOSCOPY;  Surgeon: Malissa Hippo, MD;  Location: [facility];  Service: Endoscopy;  Laterality: N/A;  1200   HERNIA REPAIR     INGUINAL HERNIA REPAIR  1982   right   LEFT HEART CATHETERIZATION WITH CORONARY ANGIOGRAM N/A 05/14/2011   Procedure: LEFT HEART CATHETERIZATION WITH CORONARY ANGIOGRAM;  Surgeon: Marykay Lex, MD;  Location: [facility] CATH LAB;  Service: Cardiovascular;  Laterality: N/A;  Coronary angiogram and possible PCI   LUMBAR LAMINECTOMY  09/2003   L2-3, 3-4, 4-5   NM MYOCAR PERF WALL MOTION  10/03/2009   bruce myoview - normal perfusion in all regions, EF 70%, no ischemia, low risk   TRANSTHORACIC ECHOCARDIOGRAM  03/28/2012   EF 65-70%, grade 1 diastolic dysfunction, mod AV calcification, mild MR    Current Outpatient Medications  Medication Sig Dispense Refill   acetaminophen (TYLENOL) 325 MG tablet Take 2 tablets (650 mg total) by mouth every 6 (six) hours as needed for mild pain (or Fever >/= 101). 100 tablet 0   amiodarone  (PACERONE) 200 MG tablet Take 1 tablet (200 mg total) by mouth daily. 90 tablet 3   aspirin EC 81 MG tablet Take 1 tablet (81 mg total) by mouth daily with breakfast. Swallow whole. 90 tablet 2   atorvastatin (LIPITOR) 40 MG tablet Take 1 tablet (40 mg total) by mouth daily. 90 tablet 3   cetirizine (ZYRTEC) 10 MG tablet Take 10 mg by mouth daily as needed for allergies.     dapagliflozin propanediol (FARXIGA) 5 MG TABS tablet Take 1 tablet (5 mg total) by mouth daily. 30 tablet 5   finasteride (PROSCAR) 5 MG tablet Take 1 tablet (5 mg total) by mouth daily. 90 tablet 3   fluticasone (FLONASE) 50 MCG/ACT nasal spray Place 1 spray into both nostrils as needed for allergies or rhinitis.   11   glipiZIDE (GLUCOTROL) 5 MG tablet Take 1 tablet (5 mg total) by mouth 2 (two) times daily with a meal. 180 tablet 2   isosorbide mononitrate (IMDUR) 30 MG 24 hr tablet Take 0.5 tablets (15 mg total) by mouth daily. 45 tablet 1   metoprolol tartrate (LOPRESSOR) 25 MG tablet Take 1 tablet (25 mg total) by mouth 2 (two) times daily. 180 tablet 3   mirabegron ER (MYRBETRIQ) 25 MG TB24 tablet Take 1 tablet (25 mg total) by mouth daily. 30 tablet 11   nitroGLYCERIN (NITROSTAT) 0.4 MG SL tablet Place 1 tablet (0.4 mg total) under the tongue every 5 (five) minutes as needed for chest pain (severe chest pain or pressure only). 25 tablet 3   NOVOLIN N 100 UNIT/ML injection Inject 10 Units into the skin 2 (two) times daily before a meal.     ONETOUCH ULTRA test strip USE 1 STRIP TO CHECK BLOOD GLUCOSE LEVEL TWICE DAILY.     pantoprazole (PROTONIX) 40 MG tablet Take 1 tablet (40 mg total) by mouth 2 (two) times daily. 60 tablet 2   Polyethylene Glycol 400 (BLINK TEARS OP) Place 1 drop into both eyes 2 (two) times daily.     silodosin (RAPAFLO) 8 MG CAPS capsule Take 1 capsule (8 mg total) by mouth at bedtime. 90 capsule 3   No  current facility-administered medications for this visit.    Allergies as of 12/11/2022 -  Review Complete 12/11/2022  Allergen Reaction Noted   Lisinopril Other (See Comments) 10/21/2014   Sulfa antibiotics Hives 05/11/2011   Sulfacetamide Hives 04/17/2018   Vioxx [rofecoxib] Hives 05/11/2011   Yellow jacket venom [bee venom] Swelling 03/27/2012    Family History  Problem Relation Age of Onset   Kidney failure Mother        died 3   Coronary artery disease Father        died 98   Heart attack Sister        died of MI in her 36s   Sarcoidosis Daughter        died 28    Social History   Socioeconomic History   Marital status: Married    Spouse name: Not on file   Number of children: 4   Years of education: 12   Highest education level: Not on file  Occupational History   Occupation: retired  Tobacco Use   Smoking status: Former    Packs/day: 1.00    Years: 25.00    Additional pack years: 0.00    Total pack years: 25.00    Types: Cigarettes    Passive exposure: Past   Smokeless tobacco: Never   Tobacco comments:    quit smoking cigarettes ~ 1990  Vaping Use   Vaping Use: Never used  Substance and Sexual Activity   Alcohol use: Yes    Comment: 05/11/11 "shot every now and then"   Drug use: No   Sexual activity: Yes  Other Topics Concern   Not on file  Social History Narrative   Not on file   Social Determinants of Health   Financial Resource Strain: Not on file  Food Insecurity: No Food Insecurity (11/05/2022)   Hunger Vital Sign    Worried About Running Out of Food in the Last Year: Never true    Ran Out of Food in the Last Year: Never true  Transportation Needs: No Transportation Needs (11/05/2022)   PRAPARE - Administrator, Civil Service (Medical): No    Lack of Transportation (Non-Medical): No  Physical Activity: Not on file  Stress: Not on file  Social Connections: Not on file   Review of systems General: negative for malaise, night sweats, fever, chills, weight loss Neck: Negative for lumps, goiter, pain and significant neck  swelling Resp: Negative for cough, wheezing, dyspnea at rest CV: Negative for chest pain, leg swelling, palpitations, orthopnea GI: denies  hematochezia, nausea, vomiting, diarrhea,  dysphagia, odyonophagia, early satiety or unintentional weight loss. +melena +constipation MSK: Negative for joint pain or swelling, back pain, and muscle pain. Derm: Negative for itching or rash Psych: Denies depression, anxiety, memory loss, confusion. No homicidal or suicidal ideation.  Heme: Negative for prolonged bleeding, bruising easily, and swollen nodes. Endocrine: Negative for cold or heat intolerance, polyuria, polydipsia and goiter. Neuro: negative for tremor, gait imbalance, syncope and seizures. The remainder of the review of systems is noncontributory.  Physical Exam: BP (!) 177/98   Pulse 63   Temp 97.6 F (36.4 C) (Oral)   Ht 5\' 8"  (1.727 m)   Wt 143 lb 12.8 oz (65.2 kg)   BMI 21.86 kg/m  General:   Alert and oriented. No distress noted. Pleasant and cooperative.  Head:  Normocephalic and atraumatic. Eyes:  Conjuctiva clear without scleral icterus. Mouth:  Oral mucosa pink and moist. Good dentition. No lesions. Heart: Normal  rate and rhythm, s1 and s2 heart sounds present.  Lungs: Clear lung sounds in all lobes. Respirations equal and unlabored. Abdomen:  +BS, soft, non-tender and non-distended. No rebound or guarding. No HSM or masses noted. Derm: No palmar erythema or jaundice Msk:  Symmetrical without gross deformities. Normal posture. Extremities:  Without edema. Neurologic:  Alert and  oriented x4 Psych:  Alert and cooperative. Normal mood and affect.  Invalid input(s): "6 MONTHS"   ASSESSMENT: Angel Dixon is a 82 y.o. male presenting today for melena.  Melena: off and on for the past year or so. Denies associated symptoms, no NSAID use other than daily baby aspirin. Mild weight loss of 5-6 pounds. No BRBPR. Previously scheduled for EGD which he had to cancel, recommend  proceeding with EGD for further evaluation as I cannot rule out PUD, gastritis, duodenitis, malignancy.  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  Indications, risks and benefits of procedure discussed in detail with patient. Patient verbalized understanding and is in agreement to proceed with EGD/colonoscopy.   PLAN:  Continue with PPI daily  2. Schedule EGD/Colonoscopy-2 day prep (ASA III)  All questions were answered, patient verbalized understanding and is in agreement with plan as outlined above.   Follow Up: Will be determined after procedures   Gretel Cantu L. Jeanmarie Hubert, MSN, APRN, AGNP-C Adult-Gerontology Nurse Practitioner [facility]  I have reviewed the note and agree with the APP's assessment as described in this progress note  Katrinka Blazing, MD Gastroenterology and Hepatology [facility] Gastroenterology

## 2022-12-11 NOTE — Patient Instructions (Signed)
Please continue with pantoprazole 40mg  daily We will get you scheduled for EGD and colonoscopy  It was a pleasure to see you today. I want to create trusting relationships with patients and provide genuine, compassionate, and quality care. I truly value your feedback! please be on the lookout for a survey regarding your visit with me today. I appreciate your input about our visit and your time in completing this!    Tamaiya Bump L. Jeanmarie Hubert, MSN, APRN, AGNP-C Adult-Gerontology Nurse Practitioner [facility] at [facility]

## 2022-12-11 NOTE — Addendum Note (Signed)
Addended by: Marlowe Shores on: 12/11/2022 10:09 AM   Modules accepted: Orders

## 2022-12-13 ENCOUNTER — Encounter: Payer: Self-pay | Admitting: Physician Assistant

## 2022-12-13 ENCOUNTER — Ambulatory Visit: Payer: Medicare HMO | Attending: Physician Assistant | Admitting: Physician Assistant

## 2022-12-13 VITALS — BP 136/86 | HR 63 | Ht 68.0 in | Wt 143.6 lb

## 2022-12-13 DIAGNOSIS — Z01818 Encounter for other preprocedural examination: Secondary | ICD-10-CM | POA: Diagnosis not present

## 2022-12-13 DIAGNOSIS — E785 Hyperlipidemia, unspecified: Secondary | ICD-10-CM

## 2022-12-13 DIAGNOSIS — I2510 Atherosclerotic heart disease of native coronary artery without angina pectoris: Secondary | ICD-10-CM | POA: Diagnosis not present

## 2022-12-13 DIAGNOSIS — I493 Ventricular premature depolarization: Secondary | ICD-10-CM | POA: Diagnosis not present

## 2022-12-13 NOTE — Telephone Encounter (Signed)
   Patient Name: Angel Dixon  DOB: [birth year] MRN: [id number]  Primary Cardiologist: Chrystie Nose, MD  Chart reviewed as part of pre-operative protocol coverage. Given past medical history and time since last visit, based on ACC/AHA guidelines, Angel Dixon is at acceptable risk for the planned procedure without further cardiovascular testing.  Recent echocardiogram and Myoview are normal.  He may hold aspirin for 7 days and Farxiga for 3 days prior to the procedure and restart as soon as possible afterward at the surgeon's discretion.  The patient was advised that if he develops new symptoms prior to surgery to contact our office to arrange for a follow-up visit, and he verbalized understanding.  I will route this recommendation to the requesting party via Epic fax function and remove from pre-op pool.  Please call with questions.  [place], [place] 12/13/2022, 2:42 PM

## 2022-12-13 NOTE — Progress Notes (Signed)
Cardiology Office Note:  .   Date:  12/13/2022  ID:  Hassie Bruce, DOB 1941/02/08, MRN [id number] PCP: [name], MD  [facility] Providers Cardiologist:  Chrystie Nose, MD Electrophysiologist:  Lewayne Bunting, MD      History of Present Illness: .   Angel Dixon is a 82 y.o. male with past medical history of CAD, palpitation and hyperlipidemia.  Previous cardiac catheterization in December 2012 showed 40 to 50% mid left circumflex lesion, 60% OM1, 40% PDA lesion.  He was admitted in May 2022 with chest discomfort.  Cardiology service was consulted and started him on Imdur and metoprolol.  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  He was subsequently referred to EP. Metoprolol was further uptitrated.  He was started on amiodarone therapy by EP service for suppression of PVCs.  Last Myoview obtained on 08/22/2020 showed EF 70%, no evidence of ischemia infarction. More recently, patient was seen in the ED on 11/05/2022 for chest pain.  Troponin was negative x 2.  Myoview obtained on 11/06/2022 was negative for ischemia or infarction, EF 68%, no no evidence of infarction.  Echocardiogram obtained on 11/06/2022 showed EF greater than 75%, no regional wall motion abnormality, grade 1 DD.   Patient presents today for follow-up and preoperative clearance.  Patient has upcoming robotic assisted inguinal hernia repair by Dr. Lovell Sheehan on 12/20/2022 and also EGD by Dr. Levon Hedger on 01/08/2023.  He denies any recent exertional chest pain or worsening dyspnea.  His home to his garage is about 100 feet up an incline.  He can walk up and down this incline 4-5 times a day.  He can also walk 30-minute in the local supermarket without any exertional symptoms.  Given the reassuring Myoview and echocardiogram recently, patient is at acceptable risk to proceed with both procedures.  He may hold aspirin for 7 days and Farxiga for 3 days prior to both procedure and restart as soon as  possible afterward at the surgeon's discretion.  ROS:   He denies chest pain, palpitations, dyspnea, pnd, orthopnea, n, v, dizziness, syncope, edema, weight gain, or early satiety. All other systems reviewed and are otherwise negative except as noted above.    Studies Reviewed: .        Cardiac Studies & Procedures     STRESS TESTS  NM MYOCAR MULTI W/SPECT W 11/06/2022  Narrative   Stress ECG is negative for ischemia and arrhythmias.   LV perfusion is normal. There is no evidence of ischemia. There is no evidence of infarction.   Left ventricular function is normal. Nuclear stress EF: 68 %.   The study is normal. The study is low risk.   ECHOCARDIOGRAM  ECHOCARDIOGRAM COMPLETE 11/06/2022  Narrative ECHOCARDIOGRAM REPORT    Patient Name:   Angel Dixon Date of Exam: 11/06/2022 Medical Rec #:  [id number]          Height:       68.0 in Accession #:    [id number]         Weight:       140.9 lb Date of Birth:  [birth year]           BSA:          1.761 m Patient Age:    82 years           BP:           162/81 mmHg Patient Gender: M  HR:           78 bpm. [facility]  Procedure: 2D Echo, Cardiac Doppler and Color Doppler  Indications:    Chest Pain R07.9  History:        Patient has prior history of Echocardiogram examinations, most recent 10/26/2020. CAD, Arrythmias:PVC; Risk Factors:Hypertension, Diabetes and Dyslipidemia. Angina (From Hx).  Sonographer:    Celesta Gentile RCS Referring Phys: [phone] Heloise Beecham EMOKPAE  IMPRESSIONS   1. Left ventricular ejection fraction, by estimation, is >75%. The left ventricle has hyperdynamic function. The left ventricle has no regional wall motion abnormalities. Left ventricular diastolic parameters are consistent with Grade I diastolic dysfunction (impaired relaxation). 2. Right ventricular systolic function is hyperdynamic. The right ventricular size is normal. Tricuspid regurgitation signal is  inadequate for assessing PA pressure. 3. The mitral valve is grossly normal. No evidence of mitral valve regurgitation. No evidence of mitral stenosis. 4. The aortic valve is tricuspid. There is mild calcification of the aortic valve. Aortic valve regurgitation is not visualized. No aortic stenosis is present. 5. The inferior vena cava is normal in size with greater than 50% respiratory variability, suggesting right atrial pressure of 3 mmHg.  Comparison(s): No significant change from prior study.  FINDINGS Left Ventricle: Left ventricular ejection fraction, by estimation, is >75%. The left ventricle has hyperdynamic function. The left ventricle has no regional wall motion abnormalities. The left ventricular internal cavity size was normal in size. There is no left ventricular hypertrophy. Left ventricular diastolic parameters are consistent with Grade I diastolic dysfunction (impaired relaxation).  Right Ventricle: The right ventricular size is normal. No increase in right ventricular wall thickness. Right ventricular systolic function is hyperdynamic. Tricuspid regurgitation signal is inadequate for assessing PA pressure.  Left Atrium: Left atrial size was normal in size.  Right Atrium: Right atrial size was normal in size.  Pericardium: Trivial pericardial effusion is present.  Mitral Valve: The mitral valve is grossly normal. There is mild calcification of the mitral valve leaflet(s). No evidence of mitral valve regurgitation. No evidence of mitral valve stenosis.  Tricuspid Valve: The tricuspid valve is normal in structure. Tricuspid valve regurgitation is not demonstrated. No evidence of tricuspid stenosis.  Aortic Valve: The aortic valve is tricuspid. There is mild calcification of the aortic valve. Aortic valve regurgitation is not visualized. No aortic stenosis is present.  Pulmonic Valve: The pulmonic valve was not well visualized. Pulmonic valve regurgitation is trivial. No  evidence of pulmonic stenosis.  Aorta: The aortic root is normal in size and structure.  Venous: The inferior vena cava is normal in size with greater than 50% respiratory variability, suggesting right atrial pressure of 3 mmHg.  IAS/Shunts: No atrial level shunt detected by color flow Doppler.   LEFT VENTRICLE PLAX 2D LVIDd:         3.80 cm   Diastology LVIDs:         2.70 cm   LV e' medial:    6.31 cm/s LV PW:         1.10 cm   LV E/e' medial:  11.4 LV IVS:        1.10 cm   LV e' lateral:   7.40 cm/s LVOT diam:     1.90 cm   LV E/e' lateral: 9.7 LV SV:         79 LV SV Index:   45 LVOT Area:     2.84 cm   RIGHT VENTRICLE RV S prime:  16.90 cm/s TAPSE (M-mode): 2.9 cm  LEFT ATRIUM             Index        RIGHT ATRIUM           Index LA diam:        2.80 cm 1.59 cm/m   RA Area:     15.30 cm LA Vol (A2C):   61.3 ml 34.81 ml/m  RA Volume:   44.50 ml  25.27 ml/m LA Vol (A4C):   35.5 ml 20.16 ml/m LA Biplane Vol: 48.0 ml 27.26 ml/m AORTIC VALVE LVOT Vmax:   146.00 cm/s LVOT Vmean:  89.400 cm/s LVOT VTI:    0.279 m  AORTA Ao Root diam: 3.00 cm  MITRAL VALVE MV Area (PHT): 2.16 cm     SHUNTS MV Decel Time: 352 msec     Systemic VTI:  0.28 m MV E velocity: 72.00 cm/s   Systemic Diam: 1.90 cm MV A velocity: 104.00 cm/s MV E/A ratio:  0.69  Vishnu Priya Mallipeddi Electronically signed by Winfield Rast Mallipeddi Signature Date/Time: 11/06/2022/3:41:17 PM    Final    MONITORS  LONG TERM MONITOR (3-14 DAYS) 01/02/2021  Narrative Patch Wear Time:  14 days and 0 hours (2022-07-11T11:44:25-0400 to 2022-07-25T11:44:29-0400)  Isolated VEs were frequent (20.6%, [id number]). Episode listed as NSVT (4 beats) is likely artifact, also one short run of PSVT.  Heavy burdern of PVC's, this could lead to cardiomyopathy - may need refer to EP to evaluate options for suppression.  Chrystie Nose, MD, [facility], FACP [place]  [facility] Medical Director of the Advanced  Lipid Disorders & Cardiovascular Risk Reduction Clinic Diplomate of the American Board of Clinical Lipidology Attending Cardiologist Direct Dial: [phone]  Fax: [phone] Website:  [email or web address]           Risk Assessment/Calculations:             Physical Exam:   VS:  BP 136/86   Pulse 63   Ht 5\' 8"  (1.727 m)   Wt 143 lb 9.6 oz (65.1 kg)   SpO2 99%   BMI 21.83 kg/m    Wt Readings from Last 3 Encounters:  12/13/22 143 lb 9.6 oz (65.1 kg)  12/11/22 143 lb 12.8 oz (65.2 kg)  12/04/22 142 lb (64.4 kg)    GEN: Well nourished, well developed in no acute distress NECK: No JVD; No carotid bruits CARDIAC: RRR, no murmurs, rubs, gallops RESPIRATORY:  Clear to auscultation without rales, wheezing or rhonchi  ABDOMEN: Soft, non-tender, non-distended EXTREMITIES:  No edema; No deformity   ASSESSMENT AND PLAN: .    Preoperative clearance: Upcoming robotic assisted inguinal hernia repair by Dr. Lovell Sheehan on 7/18 and EGD by Dr. Levon Hedger on 01/08/2023.  Recent Myoview was negative.  Patient may hold aspirin for 7 days and Farxiga for 3 days prior to the procedure and restart as soon as possible afterward at the discretion of the surgeon.  He is at acceptable risk to proceed from the cardiac perspective.  CAD: Recent Myoview obtained on 11/06/2022 was negative for ischemia or infarction.  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  Patient has been placed on amiodarone for PVC suppression.       Dispo: Follow-up with Dr. Rennis Golden in 1 year  Signed, Azalee Course, [place]

## 2022-12-13 NOTE — Patient Instructions (Signed)
Medication Instructions:  Your physician recommends that you continue on your current medications as directed. Please refer to the Current Medication list given to you today.  *If you need a refill on your cardiac medications before your next appointment, please call your pharmacy*   Lab Work: None ordered  If you have labs (blood work) drawn today and your tests are completely normal, you will receive your results only by: MyChart Message (if you have MyChart) OR A paper copy in the mail If you have any lab test that is abnormal or we need to change your treatment, we will call you to review the results.   Testing/Procedures: None ordered   Follow-Up: At [facility], you and your health needs are our priority.  As part of our continuing mission to provide you with exceptional heart care, we have created designated Provider Care Teams.  These Care Teams include your primary Cardiologist (physician) and Advanced Practice Providers (APPs -  Physician Assistants and Nurse Practitioners) who all work together to provide you with the care you need, when you need it.  We recommend signing up for the patient portal called "MyChart".  Sign up information is provided on this After Visit Summary.  MyChart is used to connect with patients for Virtual Visits (Telemedicine).  Patients are able to view lab/test results, encounter notes, upcoming appointments, etc.  Non-urgent messages can be sent to your provider as well.   To learn more about what you can do with MyChart, go to [email or web address].    Your next appointment:   12 month(s)  Provider:   Chrystie Nose, MD     Other Instructions You may hold your Aspirin 7 days prior to your upcoming procedures You may hold the Farxiga 3 days prior to your upcoming procedures. Please resume both medications as soon as the Surgeon says it is safe.

## 2022-12-17 ENCOUNTER — Telehealth (INDEPENDENT_AMBULATORY_CARE_PROVIDER_SITE_OTHER): Payer: Self-pay | Admitting: Gastroenterology

## 2022-12-17 ENCOUNTER — Encounter (INDEPENDENT_AMBULATORY_CARE_PROVIDER_SITE_OTHER): Payer: Self-pay

## 2022-12-17 NOTE — Telephone Encounter (Signed)
Letter mailed to pt with pre op appt

## 2022-12-18 ENCOUNTER — Encounter (HOSPITAL_COMMUNITY)
Admission: RE | Admit: 2022-12-18 | Discharge: 2022-12-18 | Disposition: A | Discharge status: Home / Self Care | Payer: Medicare HMO | Source: Ambulatory Visit | Attending: General Surgery | Admitting: General Surgery

## 2022-12-18 ENCOUNTER — Other Ambulatory Visit: Payer: Self-pay

## 2022-12-18 ENCOUNTER — Encounter (HOSPITAL_COMMUNITY): Payer: Self-pay

## 2022-12-20 ENCOUNTER — Encounter (HOSPITAL_COMMUNITY): Payer: Self-pay | Admitting: General Surgery

## 2022-12-20 ENCOUNTER — Ambulatory Visit (HOSPITAL_COMMUNITY): Payer: Medicare HMO | Admitting: Anesthesiology

## 2022-12-20 ENCOUNTER — Ambulatory Visit (HOSPITAL_COMMUNITY)
Admission: RE | Admit: 2022-12-20 | Discharge: 2022-12-20 | Disposition: A | Discharge status: Home / Self Care | Payer: Medicare HMO | Attending: General Surgery | Admitting: General Surgery

## 2022-12-20 ENCOUNTER — Other Ambulatory Visit: Payer: Self-pay

## 2022-12-20 ENCOUNTER — Encounter (HOSPITAL_COMMUNITY)
Admission: RE | Disposition: A | Discharge status: Home / Self Care | Payer: Self-pay | Source: Home / Self Care | Attending: General Surgery

## 2022-12-20 ENCOUNTER — Ambulatory Visit (HOSPITAL_BASED_OUTPATIENT_CLINIC_OR_DEPARTMENT_OTHER): Payer: Medicare HMO | Admitting: Anesthesiology

## 2022-12-20 DIAGNOSIS — Z7984 Long term (current) use of oral hypoglycemic drugs: Secondary | ICD-10-CM | POA: Insufficient documentation

## 2022-12-20 DIAGNOSIS — M1990 Unspecified osteoarthritis, unspecified site: Secondary | ICD-10-CM | POA: Diagnosis not present

## 2022-12-20 DIAGNOSIS — E119 Type 2 diabetes mellitus without complications: Secondary | ICD-10-CM | POA: Insufficient documentation

## 2022-12-20 DIAGNOSIS — K4090 Unilateral inguinal hernia, without obstruction or gangrene, not specified as recurrent: Secondary | ICD-10-CM | POA: Diagnosis not present

## 2022-12-20 DIAGNOSIS — Z09 Encounter for follow-up examination after completed treatment for conditions other than malignant neoplasm: Secondary | ICD-10-CM | POA: Insufficient documentation

## 2022-12-20 DIAGNOSIS — I10 Essential (primary) hypertension: Secondary | ICD-10-CM | POA: Diagnosis not present

## 2022-12-20 DIAGNOSIS — I2510 Atherosclerotic heart disease of native coronary artery without angina pectoris: Secondary | ICD-10-CM | POA: Insufficient documentation

## 2022-12-20 DIAGNOSIS — K219 Gastro-esophageal reflux disease without esophagitis: Secondary | ICD-10-CM | POA: Insufficient documentation

## 2022-12-20 DIAGNOSIS — Z87891 Personal history of nicotine dependence: Secondary | ICD-10-CM

## 2022-12-20 DIAGNOSIS — Z794 Long term (current) use of insulin: Secondary | ICD-10-CM | POA: Insufficient documentation

## 2022-12-20 DIAGNOSIS — I25119 Atherosclerotic heart disease of native coronary artery with unspecified angina pectoris: Secondary | ICD-10-CM

## 2022-12-20 HISTORY — PX: XI ROBOTIC ASSISTED INGUINAL HERNIA REPAIR WITH MESH: SHX6706

## 2022-12-20 LAB — GLUCOSE, CAPILLARY: Glucose-Capillary: 164 mg/dL — ABNORMAL HIGH (ref 70–99)

## 2022-12-20 SURGERY — REPAIR, HERNIA, INGUINAL, ROBOT-ASSISTED, LAPAROSCOPIC, USING MESH
Anesthesia: General | Site: Abdomen | Laterality: Left

## 2022-12-20 MED ORDER — PROPOFOL 10 MG/ML IV BOLUS
INTRAVENOUS | Status: DC | PRN
Start: 2022-12-20 — End: 2022-12-20
  Administered 2022-12-20: 100 mg via INTRAVENOUS

## 2022-12-20 MED ORDER — BUPIVACAINE HCL (PF) 0.5 % IJ SOLN
INTRAMUSCULAR | Status: AC
  Filled 2022-12-20: qty 30

## 2022-12-20 MED ORDER — FENTANYL CITRATE (PF) 100 MCG/2ML IJ SOLN
INTRAMUSCULAR | Status: DC | PRN
  Administered 2022-12-20 (×2): 50 ug via INTRAVENOUS

## 2022-12-20 MED ORDER — FENTANYL CITRATE (PF) 100 MCG/2ML IJ SOLN
INTRAMUSCULAR | Status: AC
  Filled 2022-12-20: qty 2

## 2022-12-20 MED ORDER — ONDANSETRON HCL 4 MG/2ML IJ SOLN
INTRAMUSCULAR | Status: AC
  Filled 2022-12-20: qty 2

## 2022-12-20 MED ORDER — ROCURONIUM BROMIDE 10 MG/ML (PF) SYRINGE
PREFILLED_SYRINGE | INTRAVENOUS | Status: DC | PRN
  Administered 2022-12-20: 10 mg via INTRAVENOUS
  Administered 2022-12-20: 50 mg via INTRAVENOUS

## 2022-12-20 MED ORDER — DEXAMETHASONE SODIUM PHOSPHATE 10 MG/ML IJ SOLN
INTRAMUSCULAR | Status: AC
  Filled 2022-12-20: qty 1

## 2022-12-20 MED ORDER — STERILE WATER FOR IRRIGATION IR SOLN
Status: DC | PRN
  Administered 2022-12-20: 500 mL

## 2022-12-20 MED ORDER — CHLORHEXIDINE GLUCONATE CLOTH 2 % EX PADS
6.0000 | MEDICATED_PAD | Freq: Once | CUTANEOUS | Status: AC
  Administered 2022-12-20: 6 via TOPICAL

## 2022-12-20 MED ORDER — LIDOCAINE HCL (PF) 2 % IJ SOLN
INTRAMUSCULAR | Status: AC
  Filled 2022-12-20: qty 5

## 2022-12-20 MED ORDER — ROCURONIUM BROMIDE 10 MG/ML (PF) SYRINGE
PREFILLED_SYRINGE | INTRAVENOUS | Status: AC
  Filled 2022-12-20: qty 10

## 2022-12-20 MED ORDER — CEFAZOLIN SODIUM-DEXTROSE 2-4 GM/100ML-% IV SOLN
2.0000 g | INTRAVENOUS | Status: AC
  Administered 2022-12-20: 2 g via INTRAVENOUS
  Filled 2022-12-20: qty 100

## 2022-12-20 MED ORDER — PROPOFOL 10 MG/ML IV BOLUS
INTRAVENOUS | Status: AC
  Filled 2022-12-20: qty 20

## 2022-12-20 MED ORDER — ONDANSETRON HCL 4 MG/2ML IJ SOLN
INTRAMUSCULAR | Status: DC | PRN
  Administered 2022-12-20: 4 mg via INTRAVENOUS

## 2022-12-20 MED ORDER — LACTATED RINGERS IV SOLN: INTRAVENOUS | Status: DC | PRN

## 2022-12-20 MED ORDER — LIDOCAINE HCL (CARDIAC) PF 100 MG/5ML IV SOSY
PREFILLED_SYRINGE | INTRAVENOUS | Status: DC | PRN
  Administered 2022-12-20: 60 mg via INTRATRACHEAL

## 2022-12-20 MED ORDER — DEXAMETHASONE SODIUM PHOSPHATE 10 MG/ML IJ SOLN
INTRAMUSCULAR | Status: DC | PRN
  Administered 2022-12-20: 10 mg via INTRAVENOUS

## 2022-12-20 MED ORDER — SUGAMMADEX SODIUM 200 MG/2ML IV SOLN
INTRAVENOUS | Status: DC | PRN
  Administered 2022-12-20: 150 mg via INTRAVENOUS

## 2022-12-20 MED ORDER — TRAMADOL HCL 50 MG PO TABS: 50.0000 mg | ORAL_TABLET | Freq: Four times a day (QID) | ORAL | 0 refills | Status: DC | PRN

## 2022-12-20 MED ORDER — BUPIVACAINE HCL (PF) 0.5 % IJ SOLN
INTRAMUSCULAR | Status: DC | PRN
  Administered 2022-12-20: 20 mL

## 2022-12-20 MED ORDER — PHENYLEPHRINE 80 MCG/ML (10ML) SYRINGE FOR IV PUSH (FOR BLOOD PRESSURE SUPPORT)
PREFILLED_SYRINGE | INTRAVENOUS | Status: DC | PRN
  Administered 2022-12-20 (×2): 80 ug via INTRAVENOUS

## 2022-12-20 MED ORDER — CHLORHEXIDINE GLUCONATE CLOTH 2 % EX PADS: 6.0000 | MEDICATED_PAD | Freq: Once | CUTANEOUS | Status: DC

## 2022-12-20 SURGICAL SUPPLY — 50 items
ADH SKN CLS APL DERMABOND .7 (GAUZE/BANDAGES/DRESSINGS) ×1
APL PRP STRL LF DISP 70% ISPRP (MISCELLANEOUS) ×1
CHLORAPREP W/TINT 26 (MISCELLANEOUS) ×2 IMPLANT
COVER LIGHT HANDLE STERIS (MISCELLANEOUS) ×2 IMPLANT
COVER MAYO STAND STRL (DRAPES) ×2 IMPLANT
COVER MAYO STAND XLG (MISCELLANEOUS) ×2 IMPLANT
COVER TIP SHEARS 8 DVNC (MISCELLANEOUS) ×2 IMPLANT
DERMABOND ADVANCED .7 DNX12 (GAUZE/BANDAGES/DRESSINGS) ×2 IMPLANT
DRAPE ARM DVNC X/XI (DISPOSABLE) ×6 IMPLANT
DRAPE COLUMN DVNC XI (DISPOSABLE) ×2 IMPLANT
DRAPE HALF SHEET 40X57 (DRAPES) ×2 IMPLANT
DRIVER NDL MEGA SUTCUT DVNCXI (INSTRUMENTS) ×2 IMPLANT
DRIVER NDLE MEGA SUTCUT DVNCXI (INSTRUMENTS) ×1 IMPLANT
ELECT REM PT RETURN 9FT ADLT (ELECTROSURGICAL) ×1
ELECTRODE REM PT RTRN 9FT ADLT (ELECTROSURGICAL) ×2 IMPLANT
FORCEPS BPLR R/ABLATION 8 DVNC (INSTRUMENTS) ×2 IMPLANT
GAUZE SPONGE 4X4 12PLY STRL (GAUZE/BANDAGES/DRESSINGS) ×2 IMPLANT
GLOVE BIOGEL PI IND STRL 6.5 (GLOVE) IMPLANT
GLOVE BIOGEL PI IND STRL 7.0 (GLOVE) ×8 IMPLANT
GLOVE SS BIOGEL STRL SZ 6.5 (GLOVE) IMPLANT
GLOVE SURG SS PI 7.5 STRL IVOR (GLOVE) ×4 IMPLANT
GOWN STRL REUS W/ TWL LRG LVL3 (GOWN DISPOSABLE) IMPLANT
GOWN STRL REUS W/TWL LRG LVL3 (GOWN DISPOSABLE) ×5 IMPLANT
KIT PINK PAD W/HEAD ARE REST (MISCELLANEOUS) ×1
KIT PINK PAD W/HEAD ARM REST (MISCELLANEOUS) ×2 IMPLANT
KIT TURNOVER KIT A (KITS) ×2 IMPLANT
MANIFOLD NEPTUNE II (INSTRUMENTS) ×2 IMPLANT
MESH 3DMAX MID 5X7 LT XLRG (Mesh General) IMPLANT
NDL HYPO 21X1.5 SAFETY (NEEDLE) ×2 IMPLANT
NDL INSUFFLATION 14GA 120MM (NEEDLE) ×2 IMPLANT
NEEDLE HYPO 21X1.5 SAFETY (NEEDLE) ×1 IMPLANT
NEEDLE INSUFFLATION 14GA 120MM (NEEDLE) ×1 IMPLANT
OBTURATOR OPTICAL STND 8 DVNC (TROCAR) ×1
OBTURATOR OPTICALSTD 8 DVNC (TROCAR) ×2 IMPLANT
PACK LAP CHOLE LZT030E (CUSTOM PROCEDURE TRAY) ×2 IMPLANT
PENCIL HANDSWITCHING (ELECTRODE) ×2 IMPLANT
SCISSORS MNPLR CVD DVNC XI (INSTRUMENTS) ×2 IMPLANT
SEAL CANN UNIV 5-8 DVNC XI (MISCELLANEOUS) ×6 IMPLANT
SET BASIN LINEN APH (SET/KITS/TRAYS/PACK) ×2 IMPLANT
SET TUBE SMOKE EVAC HIGH FLOW (TUBING) ×2 IMPLANT
SOL PREP POV-IOD 4OZ 10% (MISCELLANEOUS) ×2 IMPLANT
SUT MNCRL AB 4-0 PS2 18 (SUTURE) ×4 IMPLANT
SUT V-LOC 90 ABS 3-0 VLT V-20 (SUTURE) ×4 IMPLANT
SUT VIC AB 2-0 SH 27 (SUTURE) ×1
SUT VIC AB 2-0 SH 27X BRD (SUTURE) ×2 IMPLANT
SYR 30ML LL (SYRINGE) ×2 IMPLANT
TAPE TRANSPORE STRL 2 31045 (GAUZE/BANDAGES/DRESSINGS) ×2 IMPLANT
TRAY FOL W/BAG SLVR 16FR STRL (SET/KITS/TRAYS/PACK) ×2 IMPLANT
TRAY FOLEY W/BAG SLVR 16FR LF (SET/KITS/TRAYS/PACK) ×1
WATER STERILE IRR 500ML POUR (IV SOLUTION) ×2 IMPLANT

## 2022-12-20 NOTE — Interval H&P Note (Signed)
History and Physical Interval Note:  12/20/2022 7:17 AM  Angel Dixon  has presented today for surgery, with the diagnosis of INGUINAL HERNIA. LEFT.  The various methods of treatment have been discussed with the patient and family. After consideration of risks, benefits and other options for treatment, the patient has consented to  Procedure(s): XI ROBOTIC ASSISTED INGUINAL HERNIA REPAIR WITH MESH (Left) as a surgical intervention.  The patient's history has been reviewed, patient examined, no change in status, stable for surgery.  I have reviewed the patient's chart and labs.  Questions were answered to the patient's satisfaction.     Franky Macho

## 2022-12-20 NOTE — Anesthesia Preprocedure Evaluation (Addendum)
Anesthesia Evaluation  Patient identified by MRN, date of birth, ID band Patient awake    Reviewed: Allergy & Precautions, H&P , NPO status , Patient's Chart, lab work & pertinent test results  History of Anesthesia Complications Negative for: history of anesthetic complications  Airway Mallampati: II  TM Distance: >3 FB Neck ROM: Full    Dental  (+) Upper Dentures, Missing, Dental Advisory Given   Pulmonary former smoker   Pulmonary exam normal breath sounds clear to auscultation       Cardiovascular hypertension, Pt. on medications + angina  + CAD  Normal cardiovascular exam+ dysrhythmias (PVCs, stopped taking amiodarone)  Rhythm:Regular Rate:Normal  1. Left ventricular ejection fraction, by estimation, is >75%. The left  ventricle has hyperdynamic function. The left ventricle has no regional  wall motion abnormalities. Left ventricular diastolic parameters are  consistent with Grade I diastolic  dysfunction (impaired relaxation).   2. Right ventricular systolic function is hyperdynamic. The right  ventricular size is normal. Tricuspid regurgitation signal is inadequate  for assessing PA pressure.   3. The mitral valve is grossly normal. No evidence of mitral valve  regurgitation. No evidence of mitral stenosis.   4. The aortic valve is tricuspid. There is mild calcification of the  aortic valve. Aortic valve regurgitation is not visualized. No aortic  stenosis is present.   5. The inferior vena cava is normal in size with greater than 50%  respiratory variability, suggesting right atrial pressure of 3 mmHg.   13-Dec-2022 14:08:31 [facility] ROUTINE RECORD Dec 15, 1940 (82 yr) Male Black Vent. rate 63 BPM PR interval 194 ms QRS duration 88 ms QT/QTcB 402/411 ms P-R-T axes 72 7 45 Normal sinus rhythm Normal ECG Confirmed by Azalee Course [phone]) on 12/13/2022 2:32:29 PM     Neuro/Psych negative  neurological ROS  negative psych ROS   GI/Hepatic Neg liver ROS, PUD,GERD  Medicated and Controlled,,  Endo/Other  diabetes, Well Controlled, Type 2, Oral Hypoglycemic Agents    Renal/GU negative Renal ROS  negative genitourinary   Musculoskeletal  (+) Arthritis , Osteoarthritis,    Abdominal   Peds negative pediatric ROS (+)  Hematology negative hematology ROS (+)   Anesthesia Other Findings   Reproductive/Obstetrics negative OB ROS                             Anesthesia Physical Anesthesia Plan  ASA: 3  Anesthesia Plan: General   Post-op Pain Management: Minimal or no pain anticipated   Induction: Intravenous  PONV Risk Score and Plan: 4 or greater and Ondansetron and Dexamethasone  Airway Management Planned: Oral ETT  Additional Equipment:   Intra-op Plan:   Post-operative Plan: Extubation in OR  Informed Consent: I have reviewed the patients History and Physical, chart, labs and discussed the procedure including the risks, benefits and alternatives for the proposed anesthesia with the patient or authorized representative who has indicated his/her understanding and acceptance.     Dental advisory given  Plan Discussed with: CRNA and Surgeon  Anesthesia Plan Comments:         Anesthesia Quick Evaluation

## 2022-12-20 NOTE — Op Note (Signed)
Patient:  Angel Dixon  DOB:  [birth year]  MRN:  [id number]   Preop Diagnosis: Left inguinal hernia  Postop Diagnosis: Same  Procedure: Robotic assisted laparoscopic left inguinal herniorrhaphy with mesh  Surgeon: Franky Macho, MD  Anes: General endotracheal  Indications: Patient is an 82 year old black male who presents with a symptomatic left inguinal hernia.  The risks and benefits of the procedure including bleeding, infection, mesh use, and the possibility of an open procedure were fully explained to the patient, who gave informed consent.  Procedure note: The patient was placed in the supine position.  After induction of general endotracheal anesthesia, the abdomen was prepped and draped using usual sterile technique with ChloraPrep.  Surgical site confirmation was performed.  An incision was made in the left upper quadrant at Palmer's point.  A Veress needle was introduced into the abdominal cavity and confirmation of placement was done using the saline drop test.  The abdomen was then insufflated to 15 mmHg pressure.  An 8 mm trocar was introduced into the abdominal cavity under direct visualization without difficulty.  Additional 8 mm trocars were placed in the upper midline and right upper quadrant regions.  The robot was then docked and targeted.  No right inguinal hernia was seen.  A direct left inguinal hernia was noted.  The peritoneal flap was then formed down below Cooper's ligament.  The hernia sac was reduced without difficulty.  Care was taken to avoid the spermatic cord vessels and the vas deferens.  Once clearance of greater than 6 cm was obtained posterior to the hernia, an extra-large Bard 3D max mesh was inserted.  It was secured to Cooper's ligament using a 2-0 Vicryl suture.  The mesh was also secured to the anterior abdominal wall above the hernia defect using a 2-0 Vicryl suture.  The peritoneal flap was closed using a 3-0 V-Loc running suture.  The mesh was noted  to be lying adequately against the abdominal wall.  The robot was then undocked and all air was evacuated from the abdominal cavity prior to removal of the trocars.  All wounds were irrigated with normal saline.  All wounds were injected with .5% Sensorcaine.  The incisions were closed using a 4-0 Monocryl subcuticular suture.  Dermabond was applied.  All tape and needle counts were correct at the end of the procedure.  The patient was extubated in the operating room and transferred to PACU in stable condition.  Complications: None  EBL: Minimal  Specimen: None

## 2022-12-20 NOTE — Transfer of Care (Signed)
Immediate Anesthesia Transfer of Care Note  Patient: Angel Dixon  Procedure(s) Performed: XI ROBOTIC ASSISTED INGUINAL HERNIA REPAIR WITH MESH (Left: Abdomen)  Patient Location: PACU  Anesthesia Type:General  Level of Consciousness: awake  Airway & Oxygen Therapy: Patient Spontanous Breathing and Patient connected to nasal cannula oxygen  Post-op Assessment: Report given to RN and Post -op Vital signs reviewed and stable  Post vital signs: Reviewed and stable  Last Vitals:  Vitals Value Taken Time  BP 204/91 12/20/22 0915  Temp 36.5 C 12/20/22 0915  Pulse 64 12/20/22 0915  Resp 13 12/20/22 0916  SpO2 100 % 12/20/22 0915  Vitals shown include unfiled device data.  Last Pain:  Vitals:   12/20/22 1610  TempSrc: Oral  PainSc: 0-No pain         Complications: No notable events documented.

## 2022-12-20 NOTE — Anesthesia Postprocedure Evaluation (Signed)
Anesthesia Post Note  Patient: CHANDON LAZCANO  Procedure(s) Performed: XI ROBOTIC ASSISTED INGUINAL HERNIA REPAIR WITH MESH (Left: Abdomen)  Patient location during evaluation: Phase II Anesthesia Type: General Level of consciousness: awake and alert and oriented Pain management: pain level controlled Vital Signs Assessment: post-procedure vital signs reviewed and stable Respiratory status: spontaneous breathing, nonlabored ventilation and respiratory function stable Cardiovascular status: blood pressure returned to baseline and stable Postop Assessment: no apparent nausea or vomiting Anesthetic complications: no  No notable events documented.   Last Vitals:  Vitals:   12/20/22 0945 12/20/22 0951  BP: (!) 200/91 (!) 216/93  Pulse: (!) 58 63  Resp: 13   Temp:  36.7 C  SpO2: 100% 98%    Last Pain:  Vitals:   12/20/22 0951  TempSrc: Oral  PainSc: 0-No pain                 Jasean Ambrosia C Yusif Gnau

## 2022-12-20 NOTE — Anesthesia Procedure Notes (Signed)
Procedure Name: Intubation Date/Time: 12/20/2022 7:36 AM  Performed by: Lorin Glass, CRNAPre-anesthesia Checklist: Patient identified, Emergency Drugs available, Suction available and Patient being monitored Patient Re-evaluated:Patient Re-evaluated prior to induction Oxygen Delivery Method: Circle system utilized Preoxygenation: Pre-oxygenation with 100% oxygen Induction Type: IV induction Ventilation: Mask ventilation without difficulty Laryngoscope Size: Mac and 4 Grade View: Grade I Tube type: Oral Tube size: 7.5 mm Number of attempts: 1 Airway Equipment and Method: Stylet Placement Confirmation: ETT inserted through vocal cords under direct vision, positive ETCO2 and breath sounds checked- equal and bilateral Secured at: 22 cm Tube secured with: Tape Dental Injury: Teeth and Oropharynx as per pre-operative assessment

## 2022-12-24 ENCOUNTER — Encounter (HOSPITAL_COMMUNITY): Payer: Self-pay | Admitting: General Surgery

## 2022-12-27 ENCOUNTER — Ambulatory Visit (INDEPENDENT_AMBULATORY_CARE_PROVIDER_SITE_OTHER): Payer: Medicare HMO | Admitting: General Surgery

## 2022-12-27 ENCOUNTER — Encounter: Payer: Self-pay | Admitting: General Surgery

## 2022-12-27 VITALS — BP 147/85 | HR 98 | Temp 98.0°F | Resp 12 | Ht 68.0 in | Wt 138.0 lb

## 2022-12-27 DIAGNOSIS — Z09 Encounter for follow-up examination after completed treatment for conditions other than malignant neoplasm: Secondary | ICD-10-CM

## 2022-12-27 NOTE — Progress Notes (Signed)
Subjective:     Angel Dixon  Patient here for follow-up, status post robotic assisted laparoscopic left inguinal herniorrhaphy with mesh.  He is pleased with the results.  He has a small bump in the left groin region.  He denies any nausea or vomiting.  He states this bump is decreasing in size.  He has no incisional pain. Objective:    BP (!) 147/85   Pulse 98   Temp 98 F (36.7 C) (Oral)   Resp 12   Ht 5\' 8"  (1.727 m)   Wt 138 lb (62.6 kg)   SpO2 95%   BMI 20.98 kg/m   General:  alert, cooperative, and no distress  Incisions healing well.  Left groin hematoma is small and resolving.     Assessment:    Doing well postoperatively.    Plan:   I told the patient that the hematoma should continue to resolve.  No need for further evaluation at the present time.  He was told to return my care should the hematoma not fully resolve over the next few months.  May return to normal activity.  Follow-up as needed.

## 2022-12-27 NOTE — Patient Instructions (Signed)
Senekot S two tablets by mouth every evening

## 2023-01-03 ENCOUNTER — Telehealth (INDEPENDENT_AMBULATORY_CARE_PROVIDER_SITE_OTHER): Payer: Self-pay | Admitting: Gastroenterology

## 2023-01-03 ENCOUNTER — Encounter (HOSPITAL_COMMUNITY)
Admission: RE | Admit: 2023-01-03 | Discharge: 2023-01-03 | Disposition: A | Discharge status: Home / Self Care | Payer: Medicare HMO | Source: Ambulatory Visit | Attending: Gastroenterology | Admitting: Gastroenterology

## 2023-01-03 NOTE — Telephone Encounter (Signed)
Dolores Frame, MD  Lillia Mountain, RN; Marlowe Shores, LPN; Elsie Amis, RN; Gaylene Brooks We should reschedule his case. He should see [place] in the clinic in 2-4 weeks to see how he is doing before rescheduling if he is having some much pain (especially as he had a small hematoma after his surgery).       Previous Messages    ----- Message ----- From: Lillia Mountain, RN Sent: 01/03/2023   8:40 AM EDT To: Elsie Amis, RN; Marlowe Shores, LPN; * Subject: Patient had hernia repair 12/20/2022 and his *  Hey Dr Levon Hedger,  Angel Dixon is for EGD/TCS on 01/08/2023.  He had hernia repair by Dr Lovell Sheehan 12/20/2022.  Chelsea's office note was prior to his surgery.    He and his wife are concerned that is abdomen is still tender after surgery.    Thanks,  Angel Asters RN   Pt  wife contacted. Procedures cancelled. Message sent to Endo. Pt wife transferred up front for appt

## 2023-01-08 ENCOUNTER — Encounter: Payer: Self-pay | Admitting: General Surgery

## 2023-01-08 ENCOUNTER — Ambulatory Visit (HOSPITAL_COMMUNITY): Admission: RE | Admit: 2023-01-08 | Payer: Medicare HMO | Source: Home / Self Care | Admitting: Gastroenterology

## 2023-01-08 ENCOUNTER — Encounter (HOSPITAL_COMMUNITY): Admission: RE | Payer: Self-pay | Source: Home / Self Care

## 2023-01-08 ENCOUNTER — Ambulatory Visit (INDEPENDENT_AMBULATORY_CARE_PROVIDER_SITE_OTHER): Payer: Medicare HMO | Admitting: General Surgery

## 2023-01-08 VITALS — BP 156/81 | HR 71 | Temp 98.2°F | Resp 14 | Ht 68.0 in | Wt 141.0 lb

## 2023-01-08 DIAGNOSIS — Z09 Encounter for follow-up examination after completed treatment for conditions other than malignant neoplasm: Secondary | ICD-10-CM

## 2023-01-08 SURGERY — ESOPHAGOGASTRODUODENOSCOPY (EGD) WITH PROPOFOL
Anesthesia: Monitor Anesthesia Care

## 2023-01-08 NOTE — Progress Notes (Signed)
Subjective:     Angel Dixon  Presents back for wound check.  Still has a lump in the left groin region.  It is somewhat tender to touch.  No drainage has been noted.  It is not made worse with straining. Objective:    BP (!) 156/81   Pulse 71   Temp 98.2 F (36.8 C) (Oral)   Resp 14   Ht 5\' 8"  (1.727 m)   Wt 141 lb (64 kg)   SpO2 96%   BMI 21.44 kg/m   General:  alert, cooperative, and no distress  Left inguinal region with small hematoma on proximal spermatic cord.  I did aspirate 10 cc of old clotted blood.  I could not fully resolve the hematoma.     Assessment:    Doing well postoperatively. Resolving hematoma from residual hernia sac.    Plan:   I told the patient that this should resolve over the next 4 to 6 weeks.  This is not uncommon after laparoscopic hernia repair.  He understands and agrees.  Follow-up as needed.

## 2023-01-26 ENCOUNTER — Encounter (HOSPITAL_COMMUNITY): Payer: Self-pay

## 2023-01-26 ENCOUNTER — Emergency Department (HOSPITAL_COMMUNITY): Payer: Medicare HMO

## 2023-01-26 ENCOUNTER — Observation Stay (HOSPITAL_COMMUNITY)
Admission: EM | Admit: 2023-01-26 | Discharge: 2023-01-28 | Disposition: A | Discharge status: Home / Self Care | Payer: Medicare HMO | Attending: Internal Medicine | Admitting: Internal Medicine

## 2023-01-26 ENCOUNTER — Emergency Department (HOSPITAL_COMMUNITY)
Admission: EM | Admit: 2023-01-26 | Discharge: 2023-01-26 | Disposition: A | Discharge status: Home / Self Care | Payer: Medicare HMO | Source: Home / Self Care | Attending: Emergency Medicine | Admitting: Emergency Medicine

## 2023-01-26 ENCOUNTER — Other Ambulatory Visit: Payer: Self-pay

## 2023-01-26 DIAGNOSIS — E119 Type 2 diabetes mellitus without complications: Secondary | ICD-10-CM | POA: Insufficient documentation

## 2023-01-26 DIAGNOSIS — R4182 Altered mental status, unspecified: Secondary | ICD-10-CM

## 2023-01-26 DIAGNOSIS — R2681 Unsteadiness on feet: Secondary | ICD-10-CM | POA: Diagnosis not present

## 2023-01-26 DIAGNOSIS — I482 Chronic atrial fibrillation, unspecified: Secondary | ICD-10-CM | POA: Insufficient documentation

## 2023-01-26 DIAGNOSIS — R531 Weakness: Secondary | ICD-10-CM | POA: Diagnosis present

## 2023-01-26 DIAGNOSIS — I129 Hypertensive chronic kidney disease with stage 1 through stage 4 chronic kidney disease, or unspecified chronic kidney disease: Secondary | ICD-10-CM | POA: Insufficient documentation

## 2023-01-26 DIAGNOSIS — I6621 Occlusion and stenosis of right posterior cerebral artery: Secondary | ICD-10-CM | POA: Insufficient documentation

## 2023-01-26 DIAGNOSIS — N138 Other obstructive and reflux uropathy: Secondary | ICD-10-CM | POA: Diagnosis present

## 2023-01-26 DIAGNOSIS — E1165 Type 2 diabetes mellitus with hyperglycemia: Secondary | ICD-10-CM | POA: Insufficient documentation

## 2023-01-26 DIAGNOSIS — R2689 Other abnormalities of gait and mobility: Secondary | ICD-10-CM | POA: Insufficient documentation

## 2023-01-26 DIAGNOSIS — Z79899 Other long term (current) drug therapy: Secondary | ICD-10-CM | POA: Insufficient documentation

## 2023-01-26 DIAGNOSIS — E1122 Type 2 diabetes mellitus with diabetic chronic kidney disease: Secondary | ICD-10-CM | POA: Diagnosis not present

## 2023-01-26 DIAGNOSIS — I10 Essential (primary) hypertension: Secondary | ICD-10-CM | POA: Insufficient documentation

## 2023-01-26 DIAGNOSIS — G459 Transient cerebral ischemic attack, unspecified: Principal | ICD-10-CM | POA: Diagnosis present

## 2023-01-26 DIAGNOSIS — K219 Gastro-esophageal reflux disease without esophagitis: Secondary | ICD-10-CM | POA: Diagnosis present

## 2023-01-26 DIAGNOSIS — N401 Enlarged prostate with lower urinary tract symptoms: Secondary | ICD-10-CM | POA: Insufficient documentation

## 2023-01-26 DIAGNOSIS — Z7982 Long term (current) use of aspirin: Secondary | ICD-10-CM | POA: Insufficient documentation

## 2023-01-26 DIAGNOSIS — M6281 Muscle weakness (generalized): Secondary | ICD-10-CM | POA: Diagnosis not present

## 2023-01-26 DIAGNOSIS — N400 Enlarged prostate without lower urinary tract symptoms: Secondary | ICD-10-CM

## 2023-01-26 DIAGNOSIS — E782 Mixed hyperlipidemia: Secondary | ICD-10-CM | POA: Diagnosis present

## 2023-01-26 DIAGNOSIS — I2510 Atherosclerotic heart disease of native coronary artery without angina pectoris: Secondary | ICD-10-CM | POA: Insufficient documentation

## 2023-01-26 DIAGNOSIS — Z7984 Long term (current) use of oral hypoglycemic drugs: Secondary | ICD-10-CM | POA: Insufficient documentation

## 2023-01-26 DIAGNOSIS — E46 Unspecified protein-calorie malnutrition: Secondary | ICD-10-CM

## 2023-01-26 DIAGNOSIS — R0789 Other chest pain: Secondary | ICD-10-CM | POA: Insufficient documentation

## 2023-01-26 DIAGNOSIS — N1831 Chronic kidney disease, stage 3a: Secondary | ICD-10-CM | POA: Diagnosis not present

## 2023-01-26 DIAGNOSIS — Z87891 Personal history of nicotine dependence: Secondary | ICD-10-CM | POA: Insufficient documentation

## 2023-01-26 DIAGNOSIS — E8809 Other disorders of plasma-protein metabolism, not elsewhere classified: Secondary | ICD-10-CM | POA: Diagnosis not present

## 2023-01-26 DIAGNOSIS — U071 COVID-19: Secondary | ICD-10-CM

## 2023-01-26 DIAGNOSIS — N3281 Overactive bladder: Secondary | ICD-10-CM | POA: Insufficient documentation

## 2023-01-26 LAB — COMPREHENSIVE METABOLIC PANEL
ALT: 22 U/L (ref 0–44)
AST: 21 U/L (ref 15–41)
Albumin: 3.3 g/dL — ABNORMAL LOW (ref 3.5–5.0)
Alkaline Phosphatase: 89 U/L (ref 38–126)
Anion gap: 9 (ref 5–15)
BUN: 15 mg/dL (ref 8–23)
CO2: 27 mmol/L (ref 22–32)
Calcium: 7.9 mg/dL — ABNORMAL LOW (ref 8.9–10.3)
Chloride: 95 mmol/L — ABNORMAL LOW (ref 98–111)
Creatinine, Ser: 1.4 mg/dL — ABNORMAL HIGH (ref 0.61–1.24)
GFR, Estimated: 50 mL/min — ABNORMAL LOW (ref >60)
Glucose, Bld: 162 mg/dL — ABNORMAL HIGH (ref 70–99)
Potassium: 3.5 mmol/L (ref 3.5–5.1)
Sodium: 131 mmol/L — ABNORMAL LOW (ref 135–145)
Total Bilirubin: 1.6 mg/dL — ABNORMAL HIGH (ref 0.3–1.2)
Total Protein: 6.5 g/dL (ref 6.5–8.1)

## 2023-01-26 LAB — BASIC METABOLIC PANEL
Anion gap: 9 (ref 5–15)
BUN: 14 mg/dL (ref 8–23)
CO2: 29 mmol/L (ref 22–32)
Calcium: 8.3 mg/dL — ABNORMAL LOW (ref 8.9–10.3)
Chloride: 94 mmol/L — ABNORMAL LOW (ref 98–111)
Creatinine, Ser: 1.34 mg/dL — ABNORMAL HIGH (ref 0.61–1.24)
GFR, Estimated: 53 mL/min — ABNORMAL LOW (ref >60)
Glucose, Bld: 154 mg/dL — ABNORMAL HIGH (ref 70–99)
Potassium: 3.6 mmol/L (ref 3.5–5.1)
Sodium: 132 mmol/L — ABNORMAL LOW (ref 135–145)

## 2023-01-26 LAB — CBC
HCT: 35.1 % — ABNORMAL LOW (ref 39.0–52.0)
Hemoglobin: 11.5 g/dL — ABNORMAL LOW (ref 13.0–17.0)
MCH: 28.6 pg (ref 26.0–34.0)
MCHC: 32.8 g/dL (ref 30.0–36.0)
MCV: 87.3 fL (ref 80.0–100.0)
Platelets: 217 10*3/uL (ref 150–400)
RBC: 4.02 MIL/uL — ABNORMAL LOW (ref 4.22–5.81)
RDW: 12.2 % (ref 11.5–15.5)
WBC: 8 10*3/uL (ref 4.0–10.5)
nRBC: 0 % (ref 0.0–0.2)

## 2023-01-26 LAB — PROTIME-INR
INR: 1.2 (ref 0.8–1.2)
Prothrombin Time: 15.4 seconds — ABNORMAL HIGH (ref 11.4–15.2)

## 2023-01-26 LAB — LACTIC ACID, PLASMA: Lactic Acid, Venous: 1.2 mmol/L (ref 0.5–1.9)

## 2023-01-26 LAB — SARS CORONAVIRUS 2 BY RT PCR: SARS Coronavirus 2 by RT PCR: POSITIVE — AB

## 2023-01-26 LAB — ETHANOL: Alcohol, Ethyl (B): <10 mg/dL (ref <10)

## 2023-01-26 LAB — TROPONIN I (HIGH SENSITIVITY): Troponin I (High Sensitivity): 10 ng/L (ref <18)

## 2023-01-26 LAB — APTT: aPTT: 36 seconds (ref 24–36)

## 2023-01-26 MED ORDER — ACETAMINOPHEN 650 MG RE SUPP: 650.0000 mg | Freq: Four times a day (QID) | RECTAL | Status: DC | PRN

## 2023-01-26 MED ORDER — BENZONATATE 100 MG PO CAPS
100.0000 mg | ORAL_CAPSULE | Freq: Once | ORAL | Status: AC
  Administered 2023-01-26: 100 mg via ORAL
  Filled 2023-01-26: qty 1

## 2023-01-26 MED ORDER — ONDANSETRON HCL 4 MG/2ML IJ SOLN: 4.0000 mg | Freq: Four times a day (QID) | INTRAMUSCULAR | Status: DC | PRN

## 2023-01-26 MED ORDER — SODIUM CHLORIDE 0.9 % IV BOLUS
1000.0000 mL | Freq: Once | INTRAVENOUS | Status: AC
  Administered 2023-01-26: 1000 mL via INTRAVENOUS

## 2023-01-26 MED ORDER — ACETAMINOPHEN 500 MG PO TABS: 500.0000 mg | ORAL_TABLET | Freq: Four times a day (QID) | ORAL | 0 refills | Status: AC | PRN

## 2023-01-26 MED ORDER — BENZONATATE 100 MG PO CAPS: 100.0000 mg | ORAL_CAPSULE | Freq: Three times a day (TID) | ORAL | 0 refills | Status: DC

## 2023-01-26 MED ORDER — ACETAMINOPHEN 325 MG PO TABS: 650.0000 mg | ORAL_TABLET | Freq: Four times a day (QID) | ORAL | Status: DC | PRN

## 2023-01-26 MED ORDER — ACETAMINOPHEN 325 MG PO TABS
650.0000 mg | ORAL_TABLET | Freq: Once | ORAL | Status: AC
  Administered 2023-01-26: 650 mg via ORAL
  Filled 2023-01-26: qty 2

## 2023-01-26 MED ORDER — ONDANSETRON HCL 4 MG PO TABS: 4.0000 mg | ORAL_TABLET | Freq: Four times a day (QID) | ORAL | Status: DC | PRN

## 2023-01-26 NOTE — ED Notes (Signed)
Pt passed swallow screening.

## 2023-01-26 NOTE — H&P (Incomplete)
History and Physical    Patient: Angel Dixon WUJ:[id number] DOB: 1941-05-31 DOA: 01/26/2023 DOS: the patient was seen and examined on 01/26/2023 PCP: [name], MD  Patient coming from: Home  Chief Complaint:  Chief Complaint  Patient presents with  . Weakness   HPI: Angel Dixon is a 82 y.o. male with medical history significant of hypertension, hyperlipidemia, chronic A-fib, T2DM, GERD/PUD who presents to the emergency department via EMS due to generalized weakness.  Patient was seen earlier today in the ED and was diagnosed to have COVID-19 virus infection, symptomatic treatment with Tylenol and benzonatate was given, patient felt better and was discharged home.  On getting home, patient had a family gathering despite having ongoing COVID-19 infection, patient was subsequently witnessed family members to become unresponsive at the kidney table and was noted with some drooling and also unresponsive to voice for about 10 minutes.  No seizure activity was noted.  EMS was activated and on arrival of EMS team, no focal weakness was noted and patient was already awake and alert.  IV fluid was given en route to the hospital.  Patient denies chest pain, shortness of breath, headache, vision changes, abdominal pain.  Patient endorsed being back to his baseline of functioning  ED Course:  In the emergency department, patient was hemodynamically stable.  Workup in the ED showed normocytic anemia, sodium 131, potassium 3.5, chloride 95, bicarb 27, blood glucose 162, BUN 15, creatinine 1.40, GFR 50, troponin x 1 was 10, alcohol level was less than 10, lactic acid was normal.  SARS coronavirus 2 was positive, blood culture pending. CT head without contrast showed no acute intracranial CT findings Chest x-ray showed no active cardiopulmonary disease IV hydration of 1 L NS was provided.  Hospitalist was asked to admit patient for further evaluation and management.  Review of  Systems: Review of systems as noted in the HPI. All other systems reviewed and are negative.   Past Medical History:  Diagnosis Date  . Angina   . CAD (coronary artery disease)    moderate, by cath  . Diabetes mellitus   . DJD (degenerative joint disease)    lumbar lam 4/05  . Dyslipidemia   . Dysrhythmia    "palpitation", PVCs  . GERD (gastroesophageal reflux disease)   . Hypertension   . PUD (peptic ulcer disease)    endo 03/15/10   Past Surgical History:  Procedure Laterality Date  . BACK SURGERY  2005  . CARDIAC CATHETERIZATION  02/22/2006   mild obs. CAD 60-70% narrowing in small inferior branch of 1st diagonal of LAD, smooth 10% narrowing of mid RCA (Dr. Bishop Limbo)  . 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 Jonette Eva)  . CARDIAC CATHETERIZATION  05/11/2011   nonobstructive CAD (Dr. Ranae Palms)  . COLONOSCOPY N/A 06/13/2016   Procedure: COLONOSCOPY;  Surgeon: Malissa Hippo, MD;  Location: [facility];  Service: Endoscopy;  Laterality: N/A;  1200  . HERNIA REPAIR    . INGUINAL HERNIA REPAIR  1982   right  . LEFT HEART CATHETERIZATION WITH CORONARY ANGIOGRAM N/A 05/14/2011   Procedure: LEFT HEART CATHETERIZATION WITH CORONARY ANGIOGRAM;  Surgeon: Marykay Lex, MD;  Location: [facility];  Service: Cardiovascular;  Laterality: N/A;  Coronary angiogram and possible PCI  . LUMBAR LAMINECTOMY  09/2003   L2-3, 3-4, 4-5  . NM MYOCAR PERF WALL MOTION  10/03/2009   bruce myoview - normal  perfusion in all regions, EF 70%, no ischemia, low risk  . TRANSTHORACIC ECHOCARDIOGRAM  03/28/2012   EF 65-70%, grade 1 diastolic dysfunction, mod AV calcification, mild MR  . XI ROBOTIC ASSISTED INGUINAL HERNIA REPAIR WITH MESH Left 12/20/2022   Procedure: XI ROBOTIC ASSISTED INGUINAL HERNIA REPAIR WITH MESH;  Surgeon: Franky Macho, MD;  Location: [facility];  Service: General;  Laterality: Left;    Social History:   reports that he has quit smoking. His smoking use included cigarettes. He has a 25 pack-year smoking history. He has been exposed to tobacco smoke. He has never used smokeless tobacco. He reports current alcohol use. He reports that he does not use drugs.   Allergies  Allergen Reactions  . Lisinopril Other (See Comments)    Sore throat, no obvious angioedema or cough  . Sulfa Antibiotics Hives  . Sulfacetamide Hives  . Vioxx [Rofecoxib] Hives  . Yellow Jacket Venom [Bee Venom] Swelling    Family History  Problem Relation Age of Onset  . Kidney failure Mother        died 31  . Coronary artery disease Father        died 91  . Heart attack Sister        died of MI in her 66s  . Sarcoidosis Daughter        died 73    ***  Prior to Admission medications   Medication Sig Start Date End Date Taking? Authorizing Provider  acetaminophen (TYLENOL) 500 MG tablet Take 1 tablet (500 mg total) by mouth every 6 (six) hours as needed. 01/26/23   Fayrene Helper, PA-C  amiodarone (PACERONE) 200 MG tablet Take 1 tablet (200 mg total) by mouth daily. 11/06/22   Shon Hale, MD  aspirin EC 81 MG tablet Take 1 tablet (81 mg total) by mouth daily with breakfast. Swallow whole. 11/06/22   Shon Hale, MD  atorvastatin (LIPITOR) 40 MG tablet Take 1 tablet (40 mg total) by mouth daily. 11/06/22   Shon Hale, MD  benzonatate (TESSALON) 100 MG capsule Take 1 capsule (100 mg total) by mouth every 8 (eight) hours. 01/26/23   Fayrene Helper, PA-C  cetirizine (ZYRTEC) 10 MG tablet Take 10 mg by mouth daily as needed for allergies.    [name], MD  dapagliflozin propanediol (FARXIGA) 5 MG TABS tablet Take 1 tablet (5 mg total) by mouth daily. 11/06/22   Shon Hale, MD  finasteride (PROSCAR) 5 MG tablet Take 1 tablet (5 mg total) by mouth daily. 11/06/22   Shon Hale, MD  fluticasone (FLONASE) 50 MCG/ACT nasal spray Place 1 spray into both nostrils as needed for allergies or rhinitis.  09/20/14    [provider]  glipiZIDE (GLUCOTROL) 5 MG tablet Take 1 tablet (5 mg total) by mouth 2 (two) times daily with a meal. 11/06/22   Emokpae, Courage, MD  ibuprofen (ADVIL) 800 MG tablet Take 800 mg by mouth 3 (three) times daily as needed. 12/04/22   [provider]  isosorbide mononitrate (IMDUR) 30 MG 24 hr tablet Take 0.5 tablets (15 mg total) by mouth daily. 11/06/22   Shon Hale, MD  meclizine (ANTIVERT) 25 MG tablet Take 25 mg by mouth 3 (three) times daily as needed for dizziness. 11/27/22   [provider]  metoprolol tartrate (LOPRESSOR) 25 MG tablet Take 1 tablet (25 mg total) by mouth 2 (two) times daily. 11/06/22   Shon Hale, MD  mirabegron ER (MYRBETRIQ) 25 MG TB24 tablet Take 1 tablet (25 mg  total) by mouth daily. 03/07/22   McKenzie, Mardene Celeste, MD  nitroGLYCERIN (NITROSTAT) 0.4 MG SL tablet Place 1 tablet (0.4 mg total) under the tongue every 5 (five) minutes as needed for chest pain (severe chest pain or pressure only). 08/12/20 05/19/29  Hilty, Lisette Abu, MD  NOVOLIN N 100 UNIT/ML injection Inject 10 Units into the skin 2 (two) times daily as needed (If Blood Sugar gets above 100). 10/17/22   [provider]  ONETOUCH ULTRA test strip USE 1 STRIP TO CHECK BLOOD GLUCOSE LEVEL TWICE DAILY. 11/22/20   [provider]  pantoprazole (PROTONIX) 40 MG tablet Take 1 tablet (40 mg total) by mouth 2 (two) times daily. 11/06/22   Shon Hale, MD  Polyethylene Glycol 400 (BLINK TEARS OP) Place 1 drop into both eyes 2 (two) times daily.    [provider]  silodosin (RAPAFLO) 8 MG CAPS capsule Take 1 capsule (8 mg total) by mouth at bedtime. 03/07/22   McKenzie, Mardene Celeste, MD  traMADol (ULTRAM) 50 MG tablet Take 1 tablet (50 mg total) by mouth every 6 (six) hours as needed. 12/20/22   Franky Macho, MD    Physical Exam: BP (!) 92/53   Pulse 66   Resp 15   Ht 5\' 8"  (1.727 m)   Wt 65.8 kg   SpO2 98%   BMI 22.06 kg/m   General: 82 y.o.  year-old male well developed well nourished in no acute distress.  Alert and oriented x3. HEENT: NCAT, EOMI Neck: Supple, trachea medial Cardiovascular: Regular rate and rhythm with no rubs or gallops.  No thyromegaly or JVD noted.  No lower extremity edema. 2/4 pulses in all 4 extremities. Respiratory: Clear to auscultation with no wheezes or rales. Good inspiratory effort. Abdomen: Soft, nontender nondistended with normal bowel sounds x4 quadrants. Muskuloskeletal: No cyanosis, clubbing or edema noted bilaterally Neuro: CN II-XII intact, strength 5/5 x 4, sensation, reflexes intact Skin: No ulcerative lesions noted or rashes Psychiatry: Judgement and insight appear normal. Mood is appropriate for condition and setting          Labs on Admission:  Basic Metabolic Panel: Recent Labs  Lab 01/26/23 1752 01/26/23 2146  NA 132* 131*  K 3.6 3.5  CL 94* 95*  CO2 29 27  GLUCOSE 154* 162*  BUN 14 15  CREATININE 1.34* 1.40*  CALCIUM 8.3* 7.9*   Liver Function Tests: Recent Labs  Lab 01/26/23 2146  AST 21  ALT 22  ALKPHOS 89  BILITOT 1.6*  PROT 6.5  ALBUMIN 3.3*   No results for input(s): "LIPASE", "AMYLASE" in the last 168 hours. No results for input(s): "AMMONIA" in the last 168 hours. CBC: Recent Labs  Lab 01/26/23 1752  WBC 8.0  HGB 11.5*  HCT 35.1*  MCV 87.3  PLT 217   Cardiac Enzymes: No results for input(s): "CKTOTAL", "CKMB", "CKMBINDEX", "TROPONINI" in the last 168 hours.  BNP (last 3 results) No results for input(s): "BNP" in the last 8760 hours.  ProBNP (last 3 results) No results for input(s): "PROBNP" in the last 8760 hours.  CBG: No results for input(s): "GLUCAP" in the last 168 hours.  Radiological Exams on Admission: CT HEAD WO CONTRAST  Result Date: 01/26/2023 CLINICAL DATA:  Generalized weakness. Concern for CVA. Recent COVID diagnosis. EXAM: CT HEAD WITHOUT CONTRAST TECHNIQUE: Contiguous axial images were obtained from the base of the skull  through the vertex without intravenous contrast. RADIATION DOSE REDUCTION: This exam was performed according to the departmental dose-optimization program  which includes automated exposure control, adjustment of the mA and/or kV according to patient size and/or use of iterative reconstruction technique. COMPARISON:  None Available. FINDINGS: Brain: There is mild global atrophy, mild atrophic ventriculomegaly. There is moderately advanced small-vessel disease of the cerebral white matter. No midline shift is seen. Basal cisterns are clear. There is no asymmetry concerning for an acute cortical based infarct, hemorrhage, mass or mass effect. Vascular: There patchy calcific plaques in the siphons, distal left vertebral artery. No hyperdense central vessels. Skull: Negative for fractures or focal lesions. Sinuses/Orbits: There is mild membrane thickening in the paranasal sinuses without fluid levels. There is no mastoid or middle ear fluid. Midline nasal septum. Unremarkable orbits apart from prior lens replacements. Other: None. IMPRESSION: 1. No acute intracranial CT findings. 2. Atrophy and moderately advanced small-vessel disease. 3. Sinus membrane disease. Electronically Signed   By: Almira Bar M.D.   On: 01/26/2023 21:52   DG Chest 2 View  Result Date: 01/26/2023 CLINICAL DATA:  Nonproductive cough EXAM: CHEST - 2 VIEW COMPARISON:  11/05/2022 FINDINGS: The heart size and mediastinal contours are within normal limits. Both lungs are clear. The visualized skeletal structures are unremarkable. IMPRESSION: No active cardiopulmonary disease. Electronically Signed   By: Charlett Nose M.D.   On: 01/26/2023 18:15    EKG: I independently viewed the EKG done and my findings are as followed: EKG showed normal sinus rhythm at a rate of 64 bpm  Assessment/Plan Present on Admission: . TIA (transient ischemic attack)  Principal Problem:   TIA (transient ischemic attack)  Transient ischemic attack, rule out  acute ischemic stroke Patient will be admitted to telemetry unit  Bilateral carotid ultrasound in the morning Echocardiogram in the morning MRI of brain without contrast in the morning Continue aspirin and statin Continue fall precautions and neuro checks Lipid panel and hemoglobin A1c will be checked Continue PT/SLP/OT eval and treat Bedside swallow eval by nursing prior to diet Teleneurology will be consulted and we shall await further recommendation  COVID-19 virus infection Patient has no shortness of breath and was afebrile Continue symptomatic treatment  CKD 3A Creatinine at 1.40 (baseline creatinine at 1.1-1.2) Provide gentle hydration Renally adjust medications, avoid nephrotoxic agents/dehydration/hypotension  Essential hypertension  Antihypertensives PRN if Blood pressure is greater than 220/120 or there is a concern for End organ damage/contraindications for permissive HTN. If blood pressure is greater than 220/120 give labetalol PO or IV or Vasotec IV with a goal of 15% reduction in BP during the first 24 hours.  Mixed hyperlipidemia Continue Lipitor  GERD/PUD Continue Protonix  Chronic atrial fibrillation Continue amiodarone  Type 2 diabetes mellitus with hyperglycemia   DVT prophylaxis: ***   Code Status: ***   Family Communication: ***   Disposition Plan: ***   Consults called: ***   Admission status: ***     Frankey Shown MD [facility] Pager [phone]  If 7PM-7AM, please contact night-coverage [email or web address] Password [facility]  01/26/2023, 11:15 PM     Review of Systems: {ROS_Text:26778} Past Medical History:  Diagnosis Date  . Angina   . CAD (coronary artery disease)    moderate, by cath  . Diabetes mellitus   . DJD (degenerative joint disease)    lumbar lam 4/05  . Dyslipidemia   . Dysrhythmia    "palpitation", PVCs  . GERD (gastroesophageal reflux disease)   . Hypertension   . PUD (peptic ulcer disease)    endo  03/15/10   Past Surgical History:  Procedure Laterality Date  .  BACK SURGERY  2005  . CARDIAC CATHETERIZATION  02/22/2006   mild obs. CAD 60-70% narrowing in small inferior branch of 1st diagonal of LAD, smooth 10% narrowing of mid RCA (Dr. Bishop Limbo)  . 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 Jonette Eva)  . CARDIAC CATHETERIZATION  05/11/2011   nonobstructive CAD (Dr. Ranae Palms)  . COLONOSCOPY N/A 06/13/2016   Procedure: COLONOSCOPY;  Surgeon: Malissa Hippo, MD;  Location: [facility];  Service: Endoscopy;  Laterality: N/A;  1200  . HERNIA REPAIR    . INGUINAL HERNIA REPAIR  1982   right  . LEFT HEART CATHETERIZATION WITH CORONARY ANGIOGRAM N/A 05/14/2011   Procedure: LEFT HEART CATHETERIZATION WITH CORONARY ANGIOGRAM;  Surgeon: Marykay Lex, MD;  Location: [facility] CATH LAB;  Service: Cardiovascular;  Laterality: N/A;  Coronary angiogram and possible PCI  . LUMBAR LAMINECTOMY  09/2003   L2-3, 3-4, 4-5  . NM MYOCAR PERF WALL MOTION  10/03/2009   bruce myoview - normal perfusion in all regions, EF 70%, no ischemia, low risk  . TRANSTHORACIC ECHOCARDIOGRAM  03/28/2012   EF 65-70%, grade 1 diastolic dysfunction, mod AV calcification, mild MR  . XI ROBOTIC ASSISTED INGUINAL HERNIA REPAIR WITH MESH Left 12/20/2022   Procedure: XI ROBOTIC ASSISTED INGUINAL HERNIA REPAIR WITH MESH;  Surgeon: Franky Macho, MD;  Location: [facility];  Service: General;  Laterality: Left;   Social History:  reports that he has quit smoking. His smoking use included cigarettes. He has a 25 pack-year smoking history. He has been exposed to tobacco smoke. He has never used smokeless tobacco. He reports current alcohol use. He reports that he does not use drugs.  Allergies  Allergen Reactions  . Lisinopril Other (See Comments)    Sore throat, no obvious angioedema or cough  . Sulfa Antibiotics Hives  . Sulfacetamide Hives  . Vioxx [Rofecoxib]  Hives  . Yellow Jacket Venom [Bee Venom] Swelling    Family History  Problem Relation Age of Onset  . Kidney failure Mother        died 55  . Coronary artery disease Father        died 61  . Heart attack Sister        died of MI in her 3s  . Sarcoidosis Daughter        died 48    Prior to Admission medications   Medication Sig Start Date End Date Taking? Authorizing Provider  acetaminophen (TYLENOL) 500 MG tablet Take 1 tablet (500 mg total) by mouth every 6 (six) hours as needed. 01/26/23   Fayrene Helper, PA-C  amiodarone (PACERONE) 200 MG tablet Take 1 tablet (200 mg total) by mouth daily. 11/06/22   Shon Hale, MD  aspirin EC 81 MG tablet Take 1 tablet (81 mg total) by mouth daily with breakfast. Swallow whole. 11/06/22   Shon Hale, MD  atorvastatin (LIPITOR) 40 MG tablet Take 1 tablet (40 mg total) by mouth daily. 11/06/22   Shon Hale, MD  benzonatate (TESSALON) 100 MG capsule Take 1 capsule (100 mg total) by mouth every 8 (eight) hours. 01/26/23   Fayrene Helper, PA-C  cetirizine (ZYRTEC) 10 MG tablet Take 10 mg by mouth daily as needed for allergies.    [name], MD  dapagliflozin propanediol (FARXIGA) 5 MG TABS tablet Take 1 tablet (5 mg total) by mouth daily. 11/06/22   Shon Hale, MD  finasteride (PROSCAR) 5 MG tablet Take 1  tablet (5 mg total) by mouth daily. 11/06/22   Shon Hale, MD  fluticasone (FLONASE) 50 MCG/ACT nasal spray Place 1 spray into both nostrils as needed for allergies or rhinitis.  09/20/14   [provider]  glipiZIDE (GLUCOTROL) 5 MG tablet Take 1 tablet (5 mg total) by mouth 2 (two) times daily with a meal. 11/06/22   Emokpae, Courage, MD  ibuprofen (ADVIL) 800 MG tablet Take 800 mg by mouth 3 (three) times daily as needed. 12/04/22   [provider]  isosorbide mononitrate (IMDUR) 30 MG 24 hr tablet Take 0.5 tablets (15 mg total) by mouth daily. 11/06/22   Shon Hale, MD  meclizine (ANTIVERT) 25 MG tablet Take 25  mg by mouth 3 (three) times daily as needed for dizziness. 11/27/22   [provider]  metoprolol tartrate (LOPRESSOR) 25 MG tablet Take 1 tablet (25 mg total) by mouth 2 (two) times daily. 11/06/22   Shon Hale, MD  mirabegron ER (MYRBETRIQ) 25 MG TB24 tablet Take 1 tablet (25 mg total) by mouth daily. 03/07/22   McKenzie, Mardene Celeste, MD  nitroGLYCERIN (NITROSTAT) 0.4 MG SL tablet Place 1 tablet (0.4 mg total) under the tongue every 5 (five) minutes as needed for chest pain (severe chest pain or pressure only). 08/12/20 05/19/29  Hilty, Lisette Abu, MD  NOVOLIN N 100 UNIT/ML injection Inject 10 Units into the skin 2 (two) times daily as needed (If Blood Sugar gets above 100). 10/17/22   [provider]  ONETOUCH ULTRA test strip USE 1 STRIP TO CHECK BLOOD GLUCOSE LEVEL TWICE DAILY. 11/22/20   [provider]  pantoprazole (PROTONIX) 40 MG tablet Take 1 tablet (40 mg total) by mouth 2 (two) times daily. 11/06/22   Shon Hale, MD  Polyethylene Glycol 400 (BLINK TEARS OP) Place 1 drop into both eyes 2 (two) times daily.    [provider]  silodosin (RAPAFLO) 8 MG CAPS capsule Take 1 capsule (8 mg total) by mouth at bedtime. 03/07/22   McKenzie, Mardene Celeste, MD  traMADol (ULTRAM) 50 MG tablet Take 1 tablet (50 mg total) by mouth every 6 (six) hours as needed. 12/20/22   Franky Macho, MD    Physical Exam: Vitals:   01/26/23 2058 01/26/23 2100 01/26/23 2215 01/26/23 2216  BP:  (!) 147/132 (!) 88/55 (!) 92/53  Pulse:  70 68 66  Resp:  18 13 15   SpO2:  96% 98% 98%  Weight: 65.8 kg     Height: 5\' 8"  (1.727 m)      *** Data Reviewed: {Tip this will not be part of the note when signed- Document your independent interpretation of telemetry tracing, EKG, lab, Radiology test or any other diagnostic tests. Add any new diagnostic test ordered today. (Optional):26781} {Results:26384}  Assessment and Plan: No notes have been filed under this hospital service. Service:  Hospitalist     Advance Care Planning:   Code Status: Prior ***  Consults: ***  Family Communication: ***  Severity of Illness: {Observation/Inpatient:21159}  Author: Frankey Shown, DO 01/26/2023 10:33 PM  For on call review [email or web address].

## 2023-01-26 NOTE — ED Triage Notes (Signed)
Patient arrived via [facility], family reports patient blood pressure was running low , systolic in the low 90's. EMS re-assess VS WNL blood pressure 122/80. Patient reports non-productive cough for 2 days. Denies chest pain.

## 2023-01-26 NOTE — ED Provider Notes (Signed)
EMERGENCY DEPARTMENT AT [facility] Provider Note   CSN: [id number] Arrival date & time: 01/26/23  2049     History  Chief Complaint  Patient presents with   Weakness    Angel Dixon is a 82 y.o. male.   Weakness  This patient is an 82 year old male with a history of atrial fibrillation on amiodarone, he is not anticoagulated other than a baby aspirin.  He also has a history of heart disease, he has hypertension on metoprolol and diabetes on Farxiga and glipizide.  He also has high cholesterol on Lipitor.  He reports that he had been here earlier in the day and was diagnosed with COVID-19.  He had been having a cough, he tested positive for COVID, he had an x-ray that was reassuring and showed no active disease.  He was not hypoxic or tachycardic or febrile and was able to be discharged home.  Upon returning home they had a family gathering despite him having an active COVID-19 infection and among the 10 people that were at the house they all witnessed him go unresponsive at the kitchen table drooling on himself and not responding to voice for approximately 10 minutes.  There was no signs of seizure activity, there was no focal weakness although they report that the first responders found that 1 side of his body seemed weaker than the other.  Upon paramedic arrival they reported that there was no focal weakness and that he was awake and alert.  They started some IV fluids and route to the hospital.  The patient is not having any headache, changes in vision, chest pain or abdominal pain and is now back to his normal self.  No history of seizures, no history of stroke    Home Medications Prior to Admission medications   Medication Sig Start Date End Date Taking? Authorizing Provider  acetaminophen (TYLENOL) 500 MG tablet Take 1 tablet (500 mg total) by mouth every 6 (six) hours as needed. 01/26/23   Fayrene Helper, PA-C  amiodarone (PACERONE) 200 MG tablet Take 1  tablet (200 mg total) by mouth daily. 11/06/22   Shon Hale, MD  aspirin EC 81 MG tablet Take 1 tablet (81 mg total) by mouth daily with breakfast. Swallow whole. 11/06/22   Shon Hale, MD  atorvastatin (LIPITOR) 40 MG tablet Take 1 tablet (40 mg total) by mouth daily. 11/06/22   Shon Hale, MD  benzonatate (TESSALON) 100 MG capsule Take 1 capsule (100 mg total) by mouth every 8 (eight) hours. 01/26/23   Fayrene Helper, PA-C  cetirizine (ZYRTEC) 10 MG tablet Take 10 mg by mouth daily as needed for allergies.    [name], MD  dapagliflozin propanediol (FARXIGA) 5 MG TABS tablet Take 1 tablet (5 mg total) by mouth daily. 11/06/22   Shon Hale, MD  finasteride (PROSCAR) 5 MG tablet Take 1 tablet (5 mg total) by mouth daily. 11/06/22   Shon Hale, MD  fluticasone (FLONASE) 50 MCG/ACT nasal spray Place 1 spray into both nostrils as needed for allergies or rhinitis.  09/20/14   [provider]  glipiZIDE (GLUCOTROL) 5 MG tablet Take 1 tablet (5 mg total) by mouth 2 (two) times daily with a meal. 11/06/22   Emokpae, Courage, MD  ibuprofen (ADVIL) 800 MG tablet Take 800 mg by mouth 3 (three) times daily as needed. 12/04/22   [provider]  isosorbide mononitrate (IMDUR) 30 MG 24 hr tablet Take 0.5 tablets (15 mg total) by mouth daily.  11/06/22   Shon Hale, MD  meclizine (ANTIVERT) 25 MG tablet Take 25 mg by mouth 3 (three) times daily as needed for dizziness. 11/27/22   [provider]  metoprolol tartrate (LOPRESSOR) 25 MG tablet Take 1 tablet (25 mg total) by mouth 2 (two) times daily. 11/06/22   Shon Hale, MD  mirabegron ER (MYRBETRIQ) 25 MG TB24 tablet Take 1 tablet (25 mg total) by mouth daily. 03/07/22   McKenzie, Mardene Celeste, MD  nitroGLYCERIN (NITROSTAT) 0.4 MG SL tablet Place 1 tablet (0.4 mg total) under the tongue every 5 (five) minutes as needed for chest pain (severe chest pain or pressure only). 08/12/20 05/19/29  Hilty, Lisette Abu, MD   NOVOLIN N 100 UNIT/ML injection Inject 10 Units into the skin 2 (two) times daily as needed (If Blood Sugar gets above 100). 10/17/22   [provider]  ONETOUCH ULTRA test strip USE 1 STRIP TO CHECK BLOOD GLUCOSE LEVEL TWICE DAILY. 11/22/20   [provider]  pantoprazole (PROTONIX) 40 MG tablet Take 1 tablet (40 mg total) by mouth 2 (two) times daily. 11/06/22   Shon Hale, MD  Polyethylene Glycol 400 (BLINK TEARS OP) Place 1 drop into both eyes 2 (two) times daily.    [provider]  silodosin (RAPAFLO) 8 MG CAPS capsule Take 1 capsule (8 mg total) by mouth at bedtime. 03/07/22   McKenzie, Mardene Celeste, MD  traMADol (ULTRAM) 50 MG tablet Take 1 tablet (50 mg total) by mouth every 6 (six) hours as needed. 12/20/22   Franky Macho, MD      Allergies    Lisinopril, Sulfa antibiotics, Sulfacetamide, Vioxx [rofecoxib], and Yellow jacket venom [bee venom]    Review of Systems   Review of Systems  Neurological:  Positive for weakness.  All other systems reviewed and are negative.   Physical Exam Updated Vital Signs BP (!) 92/53   Pulse 66   Resp 15   Ht 1.727 m (5\' 8" )   Wt 65.8 kg   SpO2 98%   BMI 22.06 kg/m  Physical Exam Vitals and nursing note reviewed.  Constitutional:      General: He is not in acute distress.    Appearance: He is well-developed.  HENT:     Head: Normocephalic and atraumatic.     Mouth/Throat:     Pharynx: No oropharyngeal exudate.  Eyes:     General: No scleral icterus.       Right eye: No discharge.        Left eye: No discharge.     Conjunctiva/sclera: Conjunctivae normal.     Pupils: Pupils are equal, round, and reactive to light.  Neck:     Thyroid: No thyromegaly.     Vascular: No JVD.  Cardiovascular:     Rate and Rhythm: Normal rate and regular rhythm.     Heart sounds: Normal heart sounds. No murmur heard.    No friction rub. No gallop.  Pulmonary:     Effort: Pulmonary effort is normal. No respiratory distress.      Breath sounds: Normal breath sounds. No wheezing or rales.  Abdominal:     General: Bowel sounds are normal. There is no distension.     Palpations: Abdomen is soft. There is no mass.     Tenderness: There is no abdominal tenderness.  Musculoskeletal:        General: No tenderness. Normal range of motion.     Cervical back: Normal range of motion and neck supple.  Lymphadenopathy:     Cervical: No cervical adenopathy.  Skin:    General: Skin is warm and dry.     Findings: No erythema or rash.  Neurological:     Mental Status: He is alert.     Coordination: Coordination normal.     Comments: Neurologic exam:  Speech clear, pupils equal round reactive to light, extraocular movements intact  Normal peripheral visual fields Cranial nerves III through XII normal including no facial droop Follows commands, moves all extremities x4, normal strength to bilateral upper and lower extremities at all major muscle groups including grip Sensation normal to light touch and pinprick Coordination intact, no limb ataxia, finger-nose-finger normal, heel shin normal bilaterally Rapid alternating movements normal No pronator drift     Psychiatric:        Behavior: Behavior normal.     ED Results / Procedures / Treatments   Labs (all labs ordered are listed, but only abnormal results are displayed) Labs Reviewed  PROTIME-INR - Abnormal; Notable for the following components:      Result Value   Prothrombin Time 15.4 (*)    All other components within normal limits  COMPREHENSIVE METABOLIC PANEL - Abnormal; Notable for the following components:   Sodium 131 (*)    Chloride 95 (*)    Glucose, Bld 162 (*)    Creatinine, Ser 1.40 (*)    Calcium 7.9 (*)    Albumin 3.3 (*)    Total Bilirubin 1.6 (*)    GFR, Estimated 50 (*)    All other components within normal limits  CULTURE, BLOOD (ROUTINE X 2)  CULTURE, BLOOD (ROUTINE X 2)  ETHANOL  APTT  RAPID URINE DRUG SCREEN, HOSP PERFORMED   URINALYSIS, ROUTINE W REFLEX MICROSCOPIC  LACTIC ACID, PLASMA  LACTIC ACID, PLASMA    EKG EKG Interpretation Date/Time:  Saturday January 26 2023 21:16:47 EDT Ventricular Rate:  64 PR Interval:  171 QRS Duration:  95 QT Interval:  424 QTC Calculation: 438 R Axis:   16  Text Interpretation: Sinus rhythm Atrial premature complex Probable left atrial enlargement RSR' in V1 or V2, right VCD or RVH Confirmed by Eber Hong ([id number]) on 01/26/2023 9:28:32 PM  Radiology CT HEAD WO CONTRAST  Result Date: 01/26/2023 CLINICAL DATA:  Generalized weakness. Concern for CVA. Recent COVID diagnosis. EXAM: CT HEAD WITHOUT CONTRAST TECHNIQUE: Contiguous axial images were obtained from the base of the skull through the vertex without intravenous contrast. RADIATION DOSE REDUCTION: This exam was performed according to the departmental dose-optimization program which includes automated exposure control, adjustment of the mA and/or kV according to patient size and/or use of iterative reconstruction technique. COMPARISON:  None Available. FINDINGS: Brain: There is mild global atrophy, mild atrophic ventriculomegaly. There is moderately advanced small-vessel disease of the cerebral white matter. No midline shift is seen. Basal cisterns are clear. There is no asymmetry concerning for an acute cortical based infarct, hemorrhage, mass or mass effect. Vascular: There patchy calcific plaques in the siphons, distal left vertebral artery. No hyperdense central vessels. Skull: Negative for fractures or focal lesions. Sinuses/Orbits: There is mild membrane thickening in the paranasal sinuses without fluid levels. There is no mastoid or middle ear fluid. Midline nasal septum. Unremarkable orbits apart from prior lens replacements. Other: None. IMPRESSION: 1. No acute intracranial CT findings. 2. Atrophy and moderately advanced small-vessel disease. 3. Sinus membrane disease. Electronically Signed   By: Almira Bar M.D.   On:  01/26/2023 21:52   DG Chest 2 View  Result Date: 01/26/2023 CLINICAL DATA:  Nonproductive cough EXAM: CHEST - 2 VIEW COMPARISON:  11/05/2022 FINDINGS: The heart size and mediastinal contours are within normal limits. Both lungs are clear. The visualized skeletal structures are unremarkable. IMPRESSION: No active cardiopulmonary disease. Electronically Signed   By: Charlett Nose M.D.   On: 01/26/2023 18:15    Procedures Procedures    Medications Ordered in ED Medications  sodium chloride 0.9 % bolus 1,000 mL (has no administration in time range)    ED Course/ Medical Decision Making/ A&P                                 Medical Decision Making Amount and/or Complexity of Data Reviewed Labs: ordered. Radiology: ordered.  Risk Decision regarding hospitalization.    This patient presents to the ED for concern of acute weakness with abnormal mental status and drooling, this involves an extensive number of treatment options, and is a complaint that carries with it a high risk of complications and morbidity.  The differential diagnosis includes stroke, seizure, I have reviewed the labs from his prior visit and there is no significant abnormal findings on the metabolic panel or the CBC, he was positive for COVID-19   Co morbidities that complicate the patient evaluation  Diabetes hypertension cholesterol, heart disease   Additional history obtained:  Additional history obtained from medical record External records from outside source obtained and reviewed including prior visits, had been admitted to the hospital for inguinal hernia repair about a month ago, had been admitted for chest pain in June 2024, reviewed prior imaging, no neuroimaging has ever been performed within the system   Lab Tests:  I Ordered, and personally interpreted labs.  The pertinent results include:  reviewed tests from prior visit earlier today.  No acute fidnings.  Other than Covid 19 +   Imaging Studies  ordered:  I ordered imaging studies including CT head -   I independently visualized and interpreted imaging which showed no acute ischemic or hemorrhagic findings. I agree with the radiologist interpretation   Cardiac Monitoring: / EKG:  The patient was maintained on a cardiac monitor.  I personally viewed and interpreted the cardiac monitored which showed an underlying rhythm of: NSR   Consultations Obtained:  I requested consultation with the hospitalist,  and discussed lab and imaging findings as well as pertinent plan - they recommend: Dr. Thomes Dinning to admit.   Problem List / ED Course / Critical interventions / Medication management  Pt started with IVF and is doing better I have reviewed the patients home medicines and have made adjustments as needed   Social Determinants of Health:  Elderly, Covid +   Test / Admission - Considered:  Admit to hospital for TIA w/u.         Final Clinical Impression(s) / ED Diagnoses Final diagnoses:  Acute weakness  Altered mental status, unspecified altered mental status type  Essential hypertension    Rx / DC Orders ED Discharge Orders     None         Eber Hong, MD 01/26/23 2237

## 2023-01-26 NOTE — ED Triage Notes (Signed)
Pt bib EMS from home with c/o generalized weakness, pt was discharged from here a couple of hours ago, tested pos for Covid, pt family was concerned for stroke, EMS says pt was negative on stroke screen, pt ambulatory, pt was given IV fluids in route and says he feels better now and not as weak. Pt reports he is able to keep down PO fluids but has not had any since discharge.

## 2023-01-26 NOTE — ED Provider Notes (Signed)
[place] EMERGENCY DEPARTMENT AT [facility] Provider Note   CSN: [id number] Arrival date & time: 01/26/23  1708     History  No chief complaint on file.   Angel Dixon is a 82 y.o. male.  You have any  The history is provided by the patient and medical records.     82 year old male with significant history of hypertension, GERD, diabetes, PUD, CAD brought here via EMS from home for complaints of cough and low blood pressure.  Patient mention for the past 2 to 3 days he has had intermittent chest pressure sensation.  The discomfort usually lasting for several minutes with some associated lightheadedness and trouble breathing with occasional nonproductive cough.  Today he asked his daughter to check his blood pressure and it was told that it was low prompting this ER visit.  At this time he report minimal chest pressure.  He denies any prior history of PE or DVT.  Denies any recent sickness, no complaints of fever or chills no runny nose sneezing or sore throat no nausea vomiting or diarrhea and no recent change in medication.  Home Medications Prior to Admission medications   Medication Sig Start Date End Date Taking? Authorizing Provider  acetaminophen (TYLENOL) 325 MG tablet Take 2 tablets (650 mg total) by mouth every 6 (six) hours as needed for mild pain (or Fever >/= 101). 11/06/22   Shon Hale, MD  amiodarone (PACERONE) 200 MG tablet Take 1 tablet (200 mg total) by mouth daily. 11/06/22   Shon Hale, MD  aspirin EC 81 MG tablet Take 1 tablet (81 mg total) by mouth daily with breakfast. Swallow whole. 11/06/22   Shon Hale, MD  atorvastatin (LIPITOR) 40 MG tablet Take 1 tablet (40 mg total) by mouth daily. 11/06/22   Shon Hale, MD  cetirizine (ZYRTEC) 10 MG tablet Take 10 mg by mouth daily as needed for allergies.    [name], MD  dapagliflozin propanediol (FARXIGA) 5 MG TABS tablet Take 1 tablet (5 mg total) by mouth daily. 11/06/22    Shon Hale, MD  finasteride (PROSCAR) 5 MG tablet Take 1 tablet (5 mg total) by mouth daily. 11/06/22   Shon Hale, MD  fluticasone (FLONASE) 50 MCG/ACT nasal spray Place 1 spray into both nostrils as needed for allergies or rhinitis.  09/20/14   [provider]  glipiZIDE (GLUCOTROL) 5 MG tablet Take 1 tablet (5 mg total) by mouth 2 (two) times daily with a meal. 11/06/22   Emokpae, Courage, MD  ibuprofen (ADVIL) 800 MG tablet Take 800 mg by mouth 3 (three) times daily as needed. 12/04/22   [provider]  isosorbide mononitrate (IMDUR) 30 MG 24 hr tablet Take 0.5 tablets (15 mg total) by mouth daily. 11/06/22   Shon Hale, MD  meclizine (ANTIVERT) 25 MG tablet Take 25 mg by mouth 3 (three) times daily as needed for dizziness. 11/27/22   [provider]  metoprolol tartrate (LOPRESSOR) 25 MG tablet Take 1 tablet (25 mg total) by mouth 2 (two) times daily. 11/06/22   Shon Hale, MD  mirabegron ER (MYRBETRIQ) 25 MG TB24 tablet Take 1 tablet (25 mg total) by mouth daily. 03/07/22   McKenzie, Mardene Celeste, MD  nitroGLYCERIN (NITROSTAT) 0.4 MG SL tablet Place 1 tablet (0.4 mg total) under the tongue every 5 (five) minutes as needed for chest pain (severe chest pain or pressure only). 08/12/20 05/19/29  Hilty, Lisette Abu, MD  NOVOLIN N 100 UNIT/ML injection Inject 10 Units into  the skin 2 (two) times daily as needed (If Blood Sugar gets above 100). 10/17/22   [provider]  ONETOUCH ULTRA test strip USE 1 STRIP TO CHECK BLOOD GLUCOSE LEVEL TWICE DAILY. 11/22/20   [provider]  pantoprazole (PROTONIX) 40 MG tablet Take 1 tablet (40 mg total) by mouth 2 (two) times daily. 11/06/22   Shon Hale, MD  Polyethylene Glycol 400 (BLINK TEARS OP) Place 1 drop into both eyes 2 (two) times daily.    [provider]  silodosin (RAPAFLO) 8 MG CAPS capsule Take 1 capsule (8 mg total) by mouth at bedtime. 03/07/22   McKenzie, Mardene Celeste, MD  traMADol  (ULTRAM) 50 MG tablet Take 1 tablet (50 mg total) by mouth every 6 (six) hours as needed. 12/20/22   Franky Macho, MD      Allergies    Lisinopril, Sulfa antibiotics, Sulfacetamide, Vioxx [rofecoxib], and Yellow jacket venom [bee venom]    Review of Systems   Review of Systems  All other systems reviewed and are negative.   Physical Exam Updated Vital Signs BP 109/65 (BP Location: Right Arm)   Pulse 84   Temp 98.4 F (36.9 C) (Oral)   Resp 15   SpO2 94%  Physical Exam Vitals and nursing note reviewed.  Constitutional:      General: He is not in acute distress.    Appearance: He is well-developed.  HENT:     Head: Atraumatic.  Eyes:     Conjunctiva/sclera: Conjunctivae normal.  Cardiovascular:     Rate and Rhythm: Normal rate and regular rhythm.     Pulses: Normal pulses.     Heart sounds: Normal heart sounds.  Pulmonary:     Effort: Pulmonary effort is normal.     Breath sounds: Normal breath sounds.  Abdominal:     Palpations: Abdomen is soft.     Tenderness: There is no abdominal tenderness.  Musculoskeletal:        General: Normal range of motion.     Cervical back: Neck supple.  Skin:    Findings: No rash.  Neurological:     Mental Status: He is alert. Mental status is at baseline.    ED Results / Procedures / Treatments   Labs (all labs ordered are listed, but only abnormal results are displayed) Labs Reviewed  SARS CORONAVIRUS 2 BY RT PCR - Abnormal; Notable for the following components:      Result Value   SARS Coronavirus 2 by RT PCR POSITIVE (*)    All other components within normal limits  BASIC METABOLIC PANEL - Abnormal; Notable for the following components:   Sodium 132 (*)    Chloride 94 (*)    Glucose, Bld 154 (*)    Creatinine, Ser 1.34 (*)    Calcium 8.3 (*)    GFR, Estimated 53 (*)    All other components within normal limits  CBC - Abnormal; Notable for the following components:   RBC 4.02 (*)    Hemoglobin 11.5 (*)    HCT 35.1 (*)     All other components within normal limits  TROPONIN I (HIGH SENSITIVITY)    EKG EKG Interpretation Date/Time:  Saturday January 26 2023 17:32:54 EDT Ventricular Rate:  84 PR Interval:  175 QRS Duration:  84 QT Interval:  372 QTC Calculation: 440 R Axis:   -19  Text Interpretation: Sinus rhythm Multiple ventricular premature complexes Probable left atrial enlargement Borderline left axis deviation Borderline low voltage, extremity leads Abnormal R-wave  progression, early transition Confirmed by Eber Hong ([id number]) on 01/26/2023 6:14:36 PM  Radiology No results found.  Procedures Procedures    Medications Ordered in ED Medications  benzonatate (TESSALON) capsule 100 mg (has no administration in time range)  acetaminophen (TYLENOL) tablet 650 mg (has no administration in time range)    ED Course/ Medical Decision Making/ A&P                                 Medical Decision Making Amount and/or Complexity of Data Reviewed Labs: ordered. Radiology: ordered.   BP 109/65 (BP Location: Right Arm)   Pulse 84   Temp 98.4 F (36.9 C) (Oral)   Resp 15   Ht 5\' 8"  (1.727 m)   Wt 65.8 kg   SpO2 94%   BMI 22.05 kg/m   71:10 PM   82 year old male with significant history of hypertension, GERD, diabetes, PUD, CAD brought here via EMS from home for complaints of cough and low blood pressure.  Patient mention for the past 2 to 3 days he has had intermittent chest pressure sensation.  The discomfort usually lasting for several minutes with some associated lightheadedness and trouble breathing with occasional nonproductive cough.  Today he asked his daughter to check his blood pressure and it was told that it was low prompting this ER visit.  At this time he report minimal chest pressure.  He denies any prior history of PE or DVT.  Denies any recent sickness, no complaints of fever or chills no runny nose sneezing or sore throat no nausea vomiting or diarrhea and no recent change in  medication.  I was able to get additional information from patient's wife over the phone.  Per wife, for the past couple days patient has had persistent cough.  He also complaining of chills, decrease in appetite, sleeping more than usual.  He did complain of some abdominal discomfort and he was given some baking soda to drink and thought it may be due to his heartburn.  Today when he felt bad, his daughter checked his blood pressure as well as his blood sugar.  Wife states that blood sugar was fine but his blood pressure was as low as 89 systolic thus prompting this ER visit.  On exam, patient is laying in bed appears to be in no acute discomfort.  He is slow to response but not confused.  He is answering questions appropriately.  Heart with normal rate and rhythm, lungs are clear to auscultation bilaterally abdomen is soft and nontender he is able to move all 4 extremities he does not exhibit any focal neurodeficit.  Vital signs showing a blood pressure of 109 systolic.  He is not tachycardic.  He is afebrile.  -Labs ordered, independently viewed and interpreted by me.  Labs remarkable for covid positive.  Normal WBC -The patient was maintained on a cardiac monitor.  I personally viewed and interpreted the cardiac monitored which showed an underlying rhythm of: NSR -Imaging independently viewed and interpreted by me and I agree with radiologist's interpretation.  Result remarkable for CXR without acute finding -This patient presents to the ED for concern of not feeling well, this involves an extensive number of treatment options, and is a complaint that carries with it a high risk of complications and morbidity.  The differential diagnosis includes covid, flu, rsv, pna, anemia, electrolytes derangement, mi, stroke, uti -Co morbidities that complicate the patient evaluation includes  CAD, Dm, GERD -Treatment includes tessalon and tylenol -Reevaluation of the patient after these medicines showed that  the patient improved -PCP office notes or outside notes reviewed -Discussion with specialist attending Dr. Hyacinth Meeker -Escalation to admission/observation considered: patients feels much better, is comfortable with discharge, and will follow up with PCP -Prescription medication considered, patient comfortable with tessalon and tylenol.  I have considered Paxlovid however it does interact with patient current blood pressure medication. -Social Determinant of Health considered which includes formal tobacco user.            Final Clinical Impression(s) / ED Diagnoses Final diagnoses:  COVID-19 virus infection    Rx / DC Orders ED Discharge Orders          Ordered    acetaminophen (TYLENOL) 500 MG tablet  Every 6 hours PRN        01/26/23 1842    benzonatate (TESSALON) 100 MG capsule  Every 8 hours        01/26/23 1842              Fayrene Helper, PA-C 01/26/23 1844    Eber Hong, MD 01/27/23 1359

## 2023-01-26 NOTE — Discharge Instructions (Addendum)
For your symptoms.  You have tested positive for COVID infection.  Unfortunately due to your current list of medication, you cannot take Paxlovid as it will interact with the medication.  You may take Tylenol and cough medication as prescribed.  Follow-up with your doctor in 2 weeks for a repeat chest x-ray to ensure your infection has resolved.  Recommendations for at home COVID-19 symptoms management:  Please continue isolation at home. If have acute worsening of symptoms please go to ER/urgent care for further evaluation. Check pulse oximetry and if below 90-92% please go to ER. The following supplements MAY help:  Vitamin C 500mg  twice a day and Quercetin 250-500 mg twice a day Vitamin D3 2000 - 4000 u/day B Complex vitamins Zinc 75-100 mg/day Melatonin 6-10 mg at night (the optimal dose is unknown) Aspirin 81mg /day (if no history of bleeding issues)

## 2023-01-26 NOTE — H&P (Signed)
History and Physical    Patient: Angel Dixon ZOX:[id number] DOB: Apr 07, 1941 DOA: 01/26/2023 DOS: the patient was seen and examined on 01/27/2023 PCP: [name], MD  Patient coming from: Home  Chief Complaint:  Chief Complaint  Patient presents with   Weakness   HPI: Angel Dixon is a 82 y.o. male with medical history significant of hypertension, hyperlipidemia, chronic A-fib, T2DM, GERD/PUD who presents to the emergency department via EMS due to generalized weakness.  Patient was seen earlier today in the ED and was diagnosed to have COVID-19 virus infection, symptomatic treatment with Tylenol and benzonatate was given, patient felt better and was discharged home.  On getting home, patient had a family gathering despite having ongoing COVID-19 infection, patient was subsequently witnessed family members to become unresponsive at the kidney table and was noted with some drooling and also unresponsive to voice for about 10 minutes.  No seizure activity was noted.  EMS was activated and on arrival of EMS team, no focal weakness was noted and patient was already awake and alert.  IV fluid was given en route to the hospital.  Patient denies chest pain, shortness of breath, headache, vision changes, abdominal pain.  Patient endorsed being back to his baseline of functioning  ED Course:  In the emergency department, patient was hemodynamically stable.  Workup in the ED showed normocytic anemia, sodium 131, potassium 3.5, chloride 95, bicarb 27, blood glucose 162, BUN 15, creatinine 1.40, GFR 50, troponin x 1 was 10, alcohol level was less than 10, lactic acid was normal.  SARS coronavirus 2 was positive, blood culture pending. CT head without contrast showed no acute intracranial CT findings Chest x-ray showed no active cardiopulmonary disease IV hydration of 1 L NS was provided.  Hospitalist was asked to admit patient for further evaluation and management.  Review of  Systems: Review of systems as noted in the HPI. All other systems reviewed and are negative.   Past Medical History:  Diagnosis Date   Angina    CAD (coronary artery disease)    moderate, by cath   Diabetes mellitus    DJD (degenerative joint disease)    lumbar lam 4/05   Dyslipidemia    Dysrhythmia    "palpitation", PVCs   GERD (gastroesophageal reflux disease)    Hypertension    PUD (peptic ulcer disease)    endo 03/15/10   Past Surgical History:  Procedure Laterality Date   BACK SURGERY  2005   CARDIAC CATHETERIZATION  02/22/2006   mild obs. CAD 60-70% narrowing in small inferior branch of 1st diagonal of LAD, smooth 10% narrowing of mid RCA (Dr. 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 Jonette Eva)   CARDIAC CATHETERIZATION  05/11/2011   nonobstructive CAD (Dr. Ranae Palms)   COLONOSCOPY N/A 06/13/2016   Procedure: COLONOSCOPY;  Surgeon: Malissa Hippo, MD;  Location: [facility];  Service: Endoscopy;  Laterality: N/A;  1200   HERNIA REPAIR     INGUINAL HERNIA REPAIR  1982   right   LEFT HEART CATHETERIZATION WITH CORONARY ANGIOGRAM N/A 05/14/2011   Procedure: LEFT HEART CATHETERIZATION WITH CORONARY ANGIOGRAM;  Surgeon: Marykay Lex, MD;  Location: [facility] CATH LAB;  Service: Cardiovascular;  Laterality: N/A;  Coronary angiogram and possible PCI   LUMBAR LAMINECTOMY  09/2003   L2-3, 3-4, 4-5   NM MYOCAR PERF WALL MOTION  10/03/2009   bruce myoview - normal  perfusion in all regions, EF 70%, no ischemia, low risk   TRANSTHORACIC ECHOCARDIOGRAM  03/28/2012   EF 65-70%, grade 1 diastolic dysfunction, mod AV calcification, mild MR   XI ROBOTIC ASSISTED INGUINAL HERNIA REPAIR WITH MESH Left 12/20/2022   Procedure: XI ROBOTIC ASSISTED INGUINAL HERNIA REPAIR WITH MESH;  Surgeon: Franky Macho, MD;  Location: [facility] ORS;  Service: General;  Laterality: Left;    Social History:  reports that he has  quit smoking. His smoking use included cigarettes. He has a 25 pack-year smoking history. He has been exposed to tobacco smoke. He has never used smokeless tobacco. He reports current alcohol use. He reports that he does not use drugs.   Allergies  Allergen Reactions   Lisinopril Other (See Comments)    Sore throat, no obvious angioedema or cough   Sulfa Antibiotics Hives   Sulfacetamide Hives   Vioxx [Rofecoxib] Hives   Yellow Jacket Venom [Bee Venom] Swelling    Family History  Problem Relation Age of Onset   Kidney failure Mother        died 37   Coronary artery disease Father        died 47   Heart attack Sister        died of MI in her 4s   Sarcoidosis Daughter        died 40     Prior to Admission medications   Medication Sig Start Date End Date Taking? Authorizing Provider  acetaminophen (TYLENOL) 500 MG tablet Take 1 tablet (500 mg total) by mouth every 6 (six) hours as needed. 01/26/23   Fayrene Helper, PA-C  amiodarone (PACERONE) 200 MG tablet Take 1 tablet (200 mg total) by mouth daily. 11/06/22   Shon Hale, MD  aspirin EC 81 MG tablet Take 1 tablet (81 mg total) by mouth daily with breakfast. Swallow whole. 11/06/22   Shon Hale, MD  atorvastatin (LIPITOR) 40 MG tablet Take 1 tablet (40 mg total) by mouth daily. 11/06/22   Shon Hale, MD  benzonatate (TESSALON) 100 MG capsule Take 1 capsule (100 mg total) by mouth every 8 (eight) hours. 01/26/23   Fayrene Helper, PA-C  cetirizine (ZYRTEC) 10 MG tablet Take 10 mg by mouth daily as needed for allergies.    [name], MD  dapagliflozin propanediol (FARXIGA) 5 MG TABS tablet Take 1 tablet (5 mg total) by mouth daily. 11/06/22   Shon Hale, MD  finasteride (PROSCAR) 5 MG tablet Take 1 tablet (5 mg total) by mouth daily. 11/06/22   Shon Hale, MD  fluticasone (FLONASE) 50 MCG/ACT nasal spray Place 1 spray into both nostrils as needed for allergies or rhinitis.  09/20/14   [provider]   glipiZIDE (GLUCOTROL) 5 MG tablet Take 1 tablet (5 mg total) by mouth 2 (two) times daily with a meal. 11/06/22   Emokpae, Courage, MD  ibuprofen (ADVIL) 800 MG tablet Take 800 mg by mouth 3 (three) times daily as needed. 12/04/22   [provider]  isosorbide mononitrate (IMDUR) 30 MG 24 hr tablet Take 0.5 tablets (15 mg total) by mouth daily. 11/06/22   Shon Hale, MD  meclizine (ANTIVERT) 25 MG tablet Take 25 mg by mouth 3 (three) times daily as needed for dizziness. 11/27/22   [provider]  metoprolol tartrate (LOPRESSOR) 25 MG tablet Take 1 tablet (25 mg total) by mouth 2 (two) times daily. 11/06/22   Shon Hale, MD  mirabegron ER (MYRBETRIQ) 25 MG TB24 tablet Take 1 tablet (25 mg total)  by mouth daily. 03/07/22   McKenzie, Mardene Celeste, MD  nitroGLYCERIN (NITROSTAT) 0.4 MG SL tablet Place 1 tablet (0.4 mg total) under the tongue every 5 (five) minutes as needed for chest pain (severe chest pain or pressure only). 08/12/20 05/19/29  Hilty, Lisette Abu, MD  NOVOLIN N 100 UNIT/ML injection Inject 10 Units into the skin 2 (two) times daily as needed (If Blood Sugar gets above 100). 10/17/22   [provider]  ONETOUCH ULTRA test strip USE 1 STRIP TO CHECK BLOOD GLUCOSE LEVEL TWICE DAILY. 11/22/20   [provider]  pantoprazole (PROTONIX) 40 MG tablet Take 1 tablet (40 mg total) by mouth 2 (two) times daily. 11/06/22   Shon Hale, MD  Polyethylene Glycol 400 (BLINK TEARS OP) Place 1 drop into both eyes 2 (two) times daily.    [provider]  silodosin (RAPAFLO) 8 MG CAPS capsule Take 1 capsule (8 mg total) by mouth at bedtime. 03/07/22   McKenzie, Mardene Celeste, MD  traMADol (ULTRAM) 50 MG tablet Take 1 tablet (50 mg total) by mouth every 6 (six) hours as needed. 12/20/22   Franky Macho, MD    Physical Exam: BP 127/64 (BP Location: Right Arm)   Pulse 70   Temp 98 F (36.7 C) (Oral)   Resp 18   Ht 5\' 8"  (1.727 m)   Wt 65.8 kg   SpO2 100%   BMI  22.06 kg/m   General: 82 y.o. year-old male well developed well nourished in no acute distress.  Alert and oriented x3. HEENT: NCAT, EOMI Neck: Supple, trachea medial Cardiovascular: Regular rate and rhythm with no rubs or gallops.  No thyromegaly or JVD noted.  No lower extremity edema. 2/4 pulses in all 4 extremities. Respiratory: Clear to auscultation with no wheezes or rales. Good inspiratory effort. Abdomen: Soft, nontender nondistended with normal bowel sounds x4 quadrants. Muskuloskeletal: No cyanosis, clubbing or edema noted bilaterally Neuro: CN II-XII intact, strength 5/5 x 4, sensation, reflexes intact Skin: No ulcerative lesions noted or rashes Psychiatry: Judgement and insight appear normal. Mood is appropriate for condition and setting          Labs on Admission:  Basic Metabolic Panel: Recent Labs  Lab 01/26/23 1752 01/26/23 2146  NA 132* 131*  K 3.6 3.5  CL 94* 95*  CO2 29 27  GLUCOSE 154* 162*  BUN 14 15  CREATININE 1.34* 1.40*  CALCIUM 8.3* 7.9*   Liver Function Tests: Recent Labs  Lab 01/26/23 2146  AST 21  ALT 22  ALKPHOS 89  BILITOT 1.6*  PROT 6.5  ALBUMIN 3.3*   No results for input(s): "LIPASE", "AMYLASE" in the last 168 hours. No results for input(s): "AMMONIA" in the last 168 hours. CBC: Recent Labs  Lab 01/26/23 1752  WBC 8.0  HGB 11.5*  HCT 35.1*  MCV 87.3  PLT 217   Cardiac Enzymes: No results for input(s): "CKTOTAL", "CKMB", "CKMBINDEX", "TROPONINI" in the last 168 hours.  BNP (last 3 results) No results for input(s): "BNP" in the last 8760 hours.  ProBNP (last 3 results) No results for input(s): "PROBNP" in the last 8760 hours.  CBG: No results for input(s): "GLUCAP" in the last 168 hours.  Radiological Exams on Admission: CT HEAD WO CONTRAST  Result Date: 01/26/2023 CLINICAL DATA:  Generalized weakness. Concern for CVA. Recent COVID diagnosis. EXAM: CT HEAD WITHOUT CONTRAST TECHNIQUE: Contiguous axial images were  obtained from the base of the skull through the vertex without intravenous contrast. RADIATION DOSE REDUCTION:  This exam was performed according to the departmental dose-optimization program which includes automated exposure control, adjustment of the mA and/or kV according to patient size and/or use of iterative reconstruction technique. COMPARISON:  None Available. FINDINGS: Brain: There is mild global atrophy, mild atrophic ventriculomegaly. There is moderately advanced small-vessel disease of the cerebral white matter. No midline shift is seen. Basal cisterns are clear. There is no asymmetry concerning for an acute cortical based infarct, hemorrhage, mass or mass effect. Vascular: There patchy calcific plaques in the siphons, distal left vertebral artery. No hyperdense central vessels. Skull: Negative for fractures or focal lesions. Sinuses/Orbits: There is mild membrane thickening in the paranasal sinuses without fluid levels. There is no mastoid or middle ear fluid. Midline nasal septum. Unremarkable orbits apart from prior lens replacements. Other: None. IMPRESSION: 1. No acute intracranial CT findings. 2. Atrophy and moderately advanced small-vessel disease. 3. Sinus membrane disease. Electronically Signed   By: Almira Bar M.D.   On: 01/26/2023 21:52   DG Chest 2 View  Result Date: 01/26/2023 CLINICAL DATA:  Nonproductive cough EXAM: CHEST - 2 VIEW COMPARISON:  11/05/2022 FINDINGS: The heart size and mediastinal contours are within normal limits. Both lungs are clear. The visualized skeletal structures are unremarkable. IMPRESSION: No active cardiopulmonary disease. Electronically Signed   By: Charlett Nose M.D.   On: 01/26/2023 18:15    EKG: I independently viewed the EKG done and my findings are as followed: EKG showed normal sinus rhythm at a rate of 64 bpm  Assessment/Plan Present on Admission:  TIA (transient ischemic attack)  Essential hypertension  Mixed hyperlipidemia  CAD (coronary  artery disease)  Benign prostatic hyperplasia with urinary obstruction  GERD (gastroesophageal reflux disease)  Hyperglycemia due to diabetes mellitus (HCC)  Principal Problem:   TIA (transient ischemic attack) Active Problems:   Essential hypertension   CAD (coronary artery disease)   Mixed hyperlipidemia   Hyperglycemia due to diabetes mellitus (HCC)   GERD (gastroesophageal reflux disease)   Benign prostatic hyperplasia with urinary obstruction   COVID-19 virus infection   Chronic kidney disease, stage 3a (HCC)   Overactive bladder   Atrial fibrillation, chronic (HCC)  Transient ischemic attack, rule out acute ischemic stroke Patient will be admitted to telemetry unit  Bilateral carotid ultrasound in the morning Echocardiogram in the morning MRI of brain without contrast in the morning Continue statin Consider starting aspirin after MRI of brain Continue fall precautions and neuro checks Lipid panel and hemoglobin A1c will be checked Continue PT/SLP/OT eval and treat Bedside swallow eval by nursing prior to diet Teleneurology will be consulted and we shall await further recommendation  COVID-19 virus infection Patient has no shortness of breath and was afebrile Continue symptomatic treatment  CKD 3A Creatinine at 1.40 (baseline creatinine at 1.1-1.2) Provide gentle hydration Renally adjust medications, avoid nephrotoxic agents/dehydration/hypotension  Hypoalbuminemia possibly due to mild protein calorie malnutrition Albumin 3.3, protein supplement will be provided  Essential hypertension  Antihypertensives PRN if Blood pressure is greater than 220/120 or there is a concern for End organ damage/contraindications for permissive HTN. If blood pressure is greater than 220/120 give labetalol PO or IV or Vasotec IV with a goal of 15% reduction in BP during the first 24 hours.  Mixed hyperlipidemia Continue Lipitor  GERD/PUD Continue Protonix  Chronic atrial  fibrillation Continue amiodarone  Type 2 diabetes mellitus with hyperglycemia Continue Semglee 6 units nightly and adjust dose accordingly Continue ISS and hypoglycemic protocol  CAD Continue aspirin after negative MRI of  the brain Continue Lipitor Metoprolol will be held at this time due to permissive hypertension  BPH Continue finasteride  Overactive bladder Continue mirabegron   DVT prophylaxis: SCDs   Advance Care Planning: Full code  Consults: Teleneurology  Family Communication: None at bedside  Severity of Illness: The appropriate patient status for this patient is OBSERVATION. Observation status is judged to be reasonable and necessary in order to provide the required intensity of service to ensure the patient's safety. The patient's presenting symptoms, physical exam findings, and initial radiographic and laboratory data in the context of their medical condition is felt to place them at decreased risk for further clinical deterioration. Furthermore, it is anticipated that the patient will be medically stable for discharge from the hospital within 2 midnights of admission.   Author: Frankey Shown, DO 01/27/2023 12:13 AM  For on call review [email or web address].

## 2023-01-27 ENCOUNTER — Other Ambulatory Visit (HOSPITAL_COMMUNITY): Payer: Self-pay | Admitting: *Deleted

## 2023-01-27 ENCOUNTER — Observation Stay (HOSPITAL_BASED_OUTPATIENT_CLINIC_OR_DEPARTMENT_OTHER): Payer: Medicare HMO

## 2023-01-27 ENCOUNTER — Observation Stay (HOSPITAL_COMMUNITY): Payer: Medicare HMO

## 2023-01-27 DIAGNOSIS — R55 Syncope and collapse: Secondary | ICD-10-CM

## 2023-01-27 DIAGNOSIS — N1831 Chronic kidney disease, stage 3a: Secondary | ICD-10-CM | POA: Insufficient documentation

## 2023-01-27 DIAGNOSIS — E876 Hypokalemia: Secondary | ICD-10-CM | POA: Diagnosis not present

## 2023-01-27 DIAGNOSIS — I482 Chronic atrial fibrillation, unspecified: Secondary | ICD-10-CM | POA: Insufficient documentation

## 2023-01-27 DIAGNOSIS — G459 Transient cerebral ischemic attack, unspecified: Secondary | ICD-10-CM | POA: Diagnosis not present

## 2023-01-27 DIAGNOSIS — N3281 Overactive bladder: Secondary | ICD-10-CM | POA: Insufficient documentation

## 2023-01-27 DIAGNOSIS — U071 COVID-19: Secondary | ICD-10-CM | POA: Insufficient documentation

## 2023-01-27 LAB — CBC
HCT: 31.2 % — ABNORMAL LOW (ref 39.0–52.0)
Hemoglobin: 10 g/dL — ABNORMAL LOW (ref 13.0–17.0)
MCH: 27.9 pg (ref 26.0–34.0)
MCHC: 32.1 g/dL (ref 30.0–36.0)
MCV: 87.2 fL (ref 80.0–100.0)
Platelets: 185 10*3/uL (ref 150–400)
RBC: 3.58 MIL/uL — ABNORMAL LOW (ref 4.22–5.81)
RDW: 12.5 % (ref 11.5–15.5)
WBC: 6.4 10*3/uL (ref 4.0–10.5)
nRBC: 0 % (ref 0.0–0.2)

## 2023-01-27 LAB — LACTIC ACID, PLASMA: Lactic Acid, Venous: 1.2 mmol/L (ref 0.5–1.9)

## 2023-01-27 LAB — COMPREHENSIVE METABOLIC PANEL
ALT: 21 U/L (ref 0–44)
AST: 20 U/L (ref 15–41)
Albumin: 3.1 g/dL — ABNORMAL LOW (ref 3.5–5.0)
Alkaline Phosphatase: 83 U/L (ref 38–126)
Anion gap: 7 (ref 5–15)
BUN: 13 mg/dL (ref 8–23)
CO2: 27 mmol/L (ref 22–32)
Calcium: 7.9 mg/dL — ABNORMAL LOW (ref 8.9–10.3)
Chloride: 98 mmol/L (ref 98–111)
Creatinine, Ser: 1.09 mg/dL (ref 0.61–1.24)
GFR, Estimated: >60 mL/min (ref >60)
Glucose, Bld: 100 mg/dL — ABNORMAL HIGH (ref 70–99)
Potassium: 3.4 mmol/L — ABNORMAL LOW (ref 3.5–5.1)
Sodium: 132 mmol/L — ABNORMAL LOW (ref 135–145)
Total Bilirubin: 1.5 mg/dL — ABNORMAL HIGH (ref 0.3–1.2)
Total Protein: 6.3 g/dL — ABNORMAL LOW (ref 6.5–8.1)

## 2023-01-27 LAB — GLUCOSE, CAPILLARY
Glucose-Capillary: 56 mg/dL — ABNORMAL LOW (ref 70–99)
Glucose-Capillary: 79 mg/dL (ref 70–99)
Glucose-Capillary: 184 mg/dL — ABNORMAL HIGH (ref 70–99)
Glucose-Capillary: 93 mg/dL (ref 70–99)
Glucose-Capillary: 182 mg/dL — ABNORMAL HIGH (ref 70–99)

## 2023-01-27 LAB — URINALYSIS, ROUTINE W REFLEX MICROSCOPIC
Bilirubin Urine: NEGATIVE
Glucose, UA: NEGATIVE mg/dL
Hgb urine dipstick: NEGATIVE
Ketones, ur: NEGATIVE mg/dL
Leukocytes,Ua: NEGATIVE
Nitrite: NEGATIVE
Protein, ur: NEGATIVE mg/dL
Specific Gravity, Urine: 1.006 (ref 1.005–1.030)
pH: 6 (ref 5.0–8.0)

## 2023-01-27 LAB — RAPID URINE DRUG SCREEN, HOSP PERFORMED
Amphetamines: NOT DETECTED
Barbiturates: NOT DETECTED
Benzodiazepines: NOT DETECTED
Cocaine: NOT DETECTED
Opiates: POSITIVE — AB
Tetrahydrocannabinol: NOT DETECTED

## 2023-01-27 LAB — ECHOCARDIOGRAM COMPLETE
Area-P 1/2: 3.12 cm2
Height: 68 in
S' Lateral: 2.7 cm
Weight: 2321 [oz_av]

## 2023-01-27 LAB — HEMOGLOBIN A1C
Hgb A1c MFr Bld: 8.9 % — ABNORMAL HIGH (ref 4.8–5.6)
Mean Plasma Glucose: 208.73 mg/dL

## 2023-01-27 LAB — LIPID PANEL
Cholesterol: 87 mg/dL (ref 0–200)
HDL: 30 mg/dL — ABNORMAL LOW (ref >40)
LDL Cholesterol: 49 mg/dL (ref 0–99)
Total CHOL/HDL Ratio: 2.9 RATIO
Triglycerides: 39 mg/dL (ref <150)
VLDL: 8 mg/dL (ref 0–40)

## 2023-01-27 LAB — MAGNESIUM: Magnesium: 1.8 mg/dL (ref 1.7–2.4)

## 2023-01-27 LAB — PHOSPHORUS: Phosphorus: 3.3 mg/dL (ref 2.5–4.6)

## 2023-01-27 MED ORDER — POLYVINYL ALCOHOL 1.4 % OP SOLN
1.0000 [drp] | Freq: Two times a day (BID) | OPHTHALMIC | Status: DC
  Administered 2023-01-27 – 2023-01-28 (×2): 1 [drp] via OPHTHALMIC
  Filled 2023-01-27: qty 15

## 2023-01-27 MED ORDER — INSULIN GLARGINE-YFGN 100 UNIT/ML ~~LOC~~ SOLN
6.0000 [IU] | Freq: Every day | SUBCUTANEOUS | Status: DC
  Filled 2023-01-27: qty 0.06

## 2023-01-27 MED ORDER — INSULIN ASPART 100 UNIT/ML IJ SOLN
0.0000 [IU] | Freq: Three times a day (TID) | INTRAMUSCULAR | Status: DC
  Administered 2023-01-27: 2 [IU] via SUBCUTANEOUS
  Administered 2023-01-28: 1 [IU] via SUBCUTANEOUS

## 2023-01-27 MED ORDER — LORATADINE 10 MG PO TABS: 10.0000 mg | ORAL_TABLET | Freq: Every day | ORAL | Status: DC | PRN

## 2023-01-27 MED ORDER — POTASSIUM CHLORIDE CRYS ER 20 MEQ PO TBCR
40.0000 meq | EXTENDED_RELEASE_TABLET | Freq: Once | ORAL | Status: AC
  Administered 2023-01-27: 40 meq via ORAL
  Filled 2023-01-27: qty 2

## 2023-01-27 MED ORDER — FINASTERIDE 5 MG PO TABS
5.0000 mg | ORAL_TABLET | Freq: Every day | ORAL | Status: DC
  Administered 2023-01-27 – 2023-01-28 (×2): 5 mg via ORAL
  Filled 2023-01-27 (×2): qty 1

## 2023-01-27 MED ORDER — MIRABEGRON ER 25 MG PO TB24
25.0000 mg | ORAL_TABLET | Freq: Every day | ORAL | Status: DC
  Administered 2023-01-27 – 2023-01-28 (×2): 25 mg via ORAL
  Filled 2023-01-27 (×2): qty 1

## 2023-01-27 MED ORDER — FLUTICASONE PROPIONATE 50 MCG/ACT NA SUSP
1.0000 | Freq: Every day | NASAL | Status: DC | PRN
  Administered 2023-01-27: 1 via NASAL
  Filled 2023-01-27: qty 16

## 2023-01-27 MED ORDER — METOPROLOL TARTRATE 25 MG PO TABS
25.0000 mg | ORAL_TABLET | Freq: Two times a day (BID) | ORAL | Status: DC
  Administered 2023-01-27 – 2023-01-28 (×2): 25 mg via ORAL
  Filled 2023-01-27 (×2): qty 1

## 2023-01-27 MED ORDER — GLUCERNA SHAKE PO LIQD
237.0000 mL | Freq: Three times a day (TID) | ORAL | Status: DC
  Administered 2023-01-27 – 2023-01-28 (×4): 237 mL via ORAL

## 2023-01-27 MED ORDER — AMIODARONE HCL 200 MG PO TABS
200.0000 mg | ORAL_TABLET | Freq: Every day | ORAL | Status: DC
  Administered 2023-01-27 – 2023-01-28 (×2): 200 mg via ORAL
  Filled 2023-01-27 (×2): qty 1

## 2023-01-27 MED ORDER — BENZONATATE 100 MG PO CAPS
100.0000 mg | ORAL_CAPSULE | Freq: Three times a day (TID) | ORAL | Status: DC
  Administered 2023-01-27 – 2023-01-28 (×6): 100 mg via ORAL
  Filled 2023-01-27 (×7): qty 1

## 2023-01-27 MED ORDER — PANTOPRAZOLE SODIUM 40 MG PO TBEC
40.0000 mg | DELAYED_RELEASE_TABLET | Freq: Two times a day (BID) | ORAL | Status: DC
  Administered 2023-01-27 – 2023-01-28 (×3): 40 mg via ORAL
  Filled 2023-01-27 (×4): qty 1

## 2023-01-27 MED ORDER — NITROGLYCERIN 0.4 MG SL SUBL: 0.4000 mg | SUBLINGUAL_TABLET | SUBLINGUAL | Status: DC | PRN

## 2023-01-27 MED ORDER — DAPAGLIFLOZIN PROPANEDIOL 5 MG PO TABS
5.0000 mg | ORAL_TABLET | Freq: Every day | ORAL | Status: DC
  Administered 2023-01-27 – 2023-01-28 (×2): 5 mg via ORAL
  Filled 2023-01-27 (×2): qty 1

## 2023-01-27 MED ORDER — ASPIRIN 81 MG PO TBEC
81.0000 mg | DELAYED_RELEASE_TABLET | Freq: Every day | ORAL | Status: DC
  Administered 2023-01-27 – 2023-01-28 (×2): 81 mg via ORAL
  Filled 2023-01-27 (×2): qty 1

## 2023-01-27 MED ORDER — ATORVASTATIN CALCIUM 40 MG PO TABS
40.0000 mg | ORAL_TABLET | Freq: Every day | ORAL | Status: DC
  Administered 2023-01-27 – 2023-01-28 (×2): 40 mg via ORAL
  Filled 2023-01-27 (×2): qty 1

## 2023-01-27 NOTE — Progress Notes (Signed)
*  PRELIMINARY RESULTS* Echocardiogram 2D Echocardiogram has been performed.  Stacey Drain 01/27/2023, 5:27 PM

## 2023-01-27 NOTE — Progress Notes (Signed)
Morning blood glucose 56 via finger stick. Pt asymptomatic alert and talking in bed. Given 1 juice and recheck done 15 mins after with the result of 79. RN aware. No other complaints at this time.

## 2023-01-27 NOTE — Progress Notes (Signed)
PROGRESS NOTE   Angel Dixon  NWG:[id number]    DOB: 25-Nov-1940    DOA: 01/26/2023  PCP: [name], MD   I have briefly reviewed patients previous medical records in [facility].  Chief Complaint  Patient presents with   Weakness    Brief Narrative:  82 year old male, PMH of CAD, on amiodarone for PVC suppression, DM 2, HLD, GERD, HTN, PUD, former tobacco use, reported occasional alcohol use, seen in the ED earlier on 01/26/2023 for generalized weakness, diagnosed to have COVID-19 infection, symptomatic treatment provided and patient discharged home, patient was then noted to have a witnessed staring spell/unresponsive episode lasting a few minutes at the kitchen table and patient was brought back to the ED for evaluation.  As per EDP note, first responders noted 1 side of his body weaker than the other (which side not documented) but subsequently paramedics saw no focal weakness and he was awake and alert. Admitted for possible syncope versus seizures versus stroke evaluation.   Assessment & Plan:  Principal Problem:   TIA (transient ischemic attack) Active Problems:   Essential hypertension   CAD (coronary artery disease)   Mixed hyperlipidemia   Hyperglycemia due to diabetes mellitus (HCC)   GERD (gastroesophageal reflux disease)   Benign prostatic hyperplasia with urinary obstruction   COVID-19 virus infection   Chronic kidney disease, stage 3a (HCC)   Overactive bladder   Atrial fibrillation, chronic (HCC)   Possible syncope (seems orthostatic) DD: Seizures, stroke, TIA versus others History from patient and family suggestive of orthostatic symptoms. CT head without acute findings. Check orthostatic vitals.  Telemetry shows sinus rhythm without arrhythmias.  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 Lactate and HS Troponin negative.  A1c 8.9 suggests poor control.  LDL 49 UDS positive for opiates Patient  appears to be back to his independent baseline.  Therapies evaluation pending. Patient has been counseled in the presence of his daughter that he must not drive for 6 months and he verbalizes understanding. Consider Neurology consultation pending workup as above  COVID-19 infection Mostly asymptomatic. Stable vital signs and not hypoxic. Continue airborne isolation.  Acute on stage II chronic kidney disease Baseline creatinine probably in the 1 range. Presented with creatinine of 1.34 > 1.4, may have been volume depleted. Resolved after IV fluids.  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 Had hypoglycemic episode with CBG of 56 this morning.  Hold Semglee Monitor closely and adjust insulins as needed Unclear as to his home regimen, awaiting pharmacy to perform home med rec.  Hypertension Normotensive off of meds currently. Awaiting home med rec review by pharmacy prior to considering resuming any antihypertensives that he may be on at home  Mixed hyperlipidemia Continue atorvastatin 40 mg daily  Anemia: Hemoglobin may be near his baseline, follow CBC in a.m.  Hypokalemia Replace and follow.  Magnesium normal.  CAD Asymptomatic of angina Pending home med rec, resume aspirin, statins and beta-blockers if he is on that  BPH Continue finasteride.  GERD/PUD history Continue PPI  Body mass index is 22.06 kg/m.   ACP Documents: None present DVT prophylaxis: SCDs Start: 01/26/23 2359     Code Status: Full Code:  Family Communication: Daughter  at bedside Disposition:  Status is: Observation The patient remains OBS appropriate and will d/c before 2 midnights.     Consultants:     Procedures:     Antimicrobials:       Subjective:  As per daughter at bedside, patient returned home from ED yesterday afternoon, laid down for a short while and then had been sitting up at the kitchen table eating lunch when family noticed that he held his head with both hands, was staring blankly, not responding to questions and had ashen color to his face.  She felt that she noticed facial droop and drooling.  She is uncertain of the duration but maybe 5 to 10 minutes.  No seizure-like activity.  Patient however pushes back to daughters history stating that he did not have any facial droop or drooling and did not have asymmetric weakness.  He does volunteer to feeling dizzy prior to the episode.  He reportedly had not eaten anything all day yesterday until that meal.  Currently without complaints.  Objective:   Vitals:   01/27/23 0201 01/27/23 0356 01/27/23 0614 01/27/23 0759  BP: 117/62 120/66 (!) 112/51 (!) 122/54  Pulse: 65 63 70 (!) 54  Resp: 18 16 20    Temp: 97.9 F (36.6 C)  98.8 F (37.1 C) 98.4 F (36.9 C)  TempSrc: Oral  Oral Oral  SpO2: 100% 99% 100% 99%  Weight:      Height:        General exam: Elderly male, moderately built and nourished sitting up comfortably at edge of bed eating his breakfast. Respiratory system: Clear to auscultation. Respiratory effort normal. Cardiovascular system: S1 & S2 heard, RRR. No JVD, murmurs, rubs, gallops or clicks. No pedal edema.  Telemetry personally reviewed: Sinus rhythm with occasional PVCs. Gastrointestinal system: Abdomen is nondistended, soft and nontender. No organomegaly or masses felt. Normal bowel sounds heard. Central nervous system: Alert and oriented. No focal neurological deficits.  No pronator drift. Extremities: Symmetric 5 x 5 power. Skin: No rashes, lesions or ulcers Psychiatry: Judgement and insight appear normal. Mood & affect appropriate.     Data Reviewed:   I have personally reviewed following labs and imaging studies   CBC: Recent Labs   Lab Feb 24, 2023 1752 01/27/23 0504  WBC 8.0 6.4  HGB 11.5* 10.0*  HCT 35.1* 31.2*  MCV 87.3 87.2  PLT 217 185    Basic Metabolic Panel: Recent Labs  Lab 2023-02-24 1752 24-Feb-2023 2146 01/27/23 0504  NA 132* 131* 132*  K 3.6 3.5 3.4*  CL 94* 95* 98  CO2 29 27 27   GLUCOSE 154* 162* 100*  BUN 14 15 13   CREATININE 1.34* 1.40* 1.09  CALCIUM 8.3* 7.9* 7.9*  MG  --   --  1.8  PHOS  --   --  3.3    Liver Function Tests: Recent Labs  Lab Feb 24, 2023 2146 01/27/23 0504  AST 21 20  ALT 22 21  ALKPHOS 89 83  BILITOT 1.6* 1.5*  PROT 6.5 6.3*  ALBUMIN 3.3* 3.1*    CBG: Recent Labs  Lab 01/27/23 0739 01/27/23 0757 01/27/23 1236  GLUCAP 56* 79 184*    Microbiology Studies:   Recent Results (from the past 240 hour(s))  SARS Coronavirus 2 by RT PCR (hospital order, performed in [facility] hospital lab) *cepheid single result test* Anterior Nasal Swab     Status: Abnormal   Collection Time: 24-Feb-2023  5:23 PM   Specimen: Anterior Nasal Swab  Result Value Ref Range  Status   SARS Coronavirus 2 by RT PCR POSITIVE (A) NEGATIVE Final    Comment: (NOTE) SARS-CoV-2 target nucleic acids are DETECTED  SARS-CoV-2 RNA is generally detectable in upper respiratory specimens  during the acute phase of infection.  Positive results are indicative  of the presence of the identified virus, but do not rule out bacterial infection or co-infection with other pathogens not detected by the test.  Clinical correlation with patient history and  other diagnostic information is necessary to determine patient infection status.  The expected result is negative.  Fact Sheet for Patients:   RoadLapTop.co.za   Fact Sheet for Healthcare Providers:   http://kim-miller.com/    This test is not yet approved or cleared by the [place] FDA and  has been authorized for detection and/or diagnosis of SARS-CoV-2 by FDA under an Emergency Use Authorization  (EUA).  This EUA will remain in effect (meaning this test can be used) for the duration of  the COVID-19 declaration under Section 564(b)(1)  of the Act, 21 U.S.C. section 360-bbb-3(b)(1), unless the authorization is terminated or revoked sooner.   Performed at [facility], [address], [place], [place] [address]   Blood culture (routine x 2)     Status: None (Preliminary result)   Collection Time: 01/26/23 10:28 PM   Specimen: BLOOD  Result Value Ref Range Status   Specimen Description BLOOD LEFT ANTECUBITAL  Final   Special Requests   Final    BOTTLES DRAWN AEROBIC AND ANAEROBIC Blood Culture adequate volume   Culture   Final    NO GROWTH < 12 HOURS Performed at [facility], [address] [place], [place] [address]    Report Status PENDING  Incomplete  Blood culture (routine x 2)     Status: None (Preliminary result)   Collection Time: 01/26/23 10:28 PM   Specimen: BLOOD  Result Value Ref Range Status   Specimen Description BLOOD RIGHT ANTECUBITAL  Final   Special Requests   Final    BOTTLES DRAWN AEROBIC AND ANAEROBIC Blood Culture adequate volume   Culture   Final    NO GROWTH < 12 HOURS Performed at [facility], [address], [place], [place] [address]    Report Status PENDING  Incomplete    Radiology Studies:  CT HEAD WO CONTRAST  Result Date: 01/26/2023 CLINICAL DATA:  Generalized weakness. Concern for CVA. Recent COVID diagnosis. EXAM: CT HEAD WITHOUT CONTRAST TECHNIQUE: Contiguous axial images were obtained from the base of the skull through the vertex without intravenous contrast. RADIATION DOSE REDUCTION: This exam was performed according to the departmental dose-optimization program which includes automated exposure control, adjustment of the mA and/or kV according to patient size and/or use of iterative reconstruction technique. COMPARISON:  None Available. FINDINGS: Brain: There is mild global atrophy, mild atrophic ventriculomegaly. There is moderately  advanced small-vessel disease of the cerebral white matter. No midline shift is seen. Basal cisterns are clear. There is no asymmetry concerning for an acute cortical based infarct, hemorrhage, mass or mass effect. Vascular: There patchy calcific plaques in the siphons, distal left vertebral artery. No hyperdense central vessels. Skull: Negative for fractures or focal lesions. Sinuses/Orbits: There is mild membrane thickening in the paranasal sinuses without fluid levels. There is no mastoid or middle ear fluid. Midline nasal septum. Unremarkable orbits apart from prior lens replacements. Other: None. IMPRESSION: 1. No acute intracranial CT findings. 2. Atrophy and moderately advanced small-vessel disease. 3. Sinus membrane disease. Electronically Signed   By: Mellody Dance  Chesser M.D.   On: 01/26/2023 21:52   DG Chest 2 View  Result Date: 01/26/2023 CLINICAL DATA:  Nonproductive cough EXAM: CHEST - 2 VIEW COMPARISON:  11/05/2022 FINDINGS: The heart size and mediastinal contours are within normal limits. Both lungs are clear. The visualized skeletal structures are unremarkable. IMPRESSION: No active cardiopulmonary disease. Electronically Signed   By: Charlett Nose M.D.   On: 01/26/2023 18:15    Scheduled Meds:    amiodarone  200 mg Oral Daily   atorvastatin  40 mg Oral Daily   benzonatate  100 mg Oral Q8H   feeding supplement (GLUCERNA SHAKE)  237 mL Oral TID BM   finasteride  5 mg Oral Daily   insulin aspart  0-9 Units Subcutaneous TID WC   insulin glargine-yfgn  6 Units Subcutaneous QHS   mirabegron ER  25 mg Oral Daily   pantoprazole  40 mg Oral BID    Continuous Infusions:     LOS: 0 days     Marcellus Scott, MD,  FACP, FHM, SFHM, [facility], [facility]   [facility] [place]     To contact the attending provider between 7A-7P or the covering provider during after hours 7P-7A, please log into the web site [email or web address] and access using universal [place]  password for that web site. If you do not have the password, please call the hospital operator.  01/27/2023, 3:42 PM

## 2023-01-28 ENCOUNTER — Observation Stay (HOSPITAL_COMMUNITY): Payer: Medicare HMO

## 2023-01-28 ENCOUNTER — Observation Stay (HOSPITAL_COMMUNITY)
Admit: 2023-01-28 | Discharge: 2023-01-28 | Disposition: A | Discharge status: Home / Self Care | Payer: Medicare HMO | Attending: Internal Medicine | Admitting: Internal Medicine

## 2023-01-28 DIAGNOSIS — R569 Unspecified convulsions: Secondary | ICD-10-CM

## 2023-01-28 DIAGNOSIS — G459 Transient cerebral ischemic attack, unspecified: Secondary | ICD-10-CM | POA: Diagnosis not present

## 2023-01-28 DIAGNOSIS — R4182 Altered mental status, unspecified: Secondary | ICD-10-CM

## 2023-01-28 LAB — CBC
HCT: 35.3 % — ABNORMAL LOW (ref 39.0–52.0)
Hemoglobin: 11.7 g/dL — ABNORMAL LOW (ref 13.0–17.0)
MCH: 28.7 pg (ref 26.0–34.0)
MCHC: 33.1 g/dL (ref 30.0–36.0)
MCV: 86.5 fL (ref 80.0–100.0)
Platelets: 208 10*3/uL (ref 150–400)
RBC: 4.08 MIL/uL — ABNORMAL LOW (ref 4.22–5.81)
RDW: 12.4 % (ref 11.5–15.5)
WBC: 4.5 10*3/uL (ref 4.0–10.5)
nRBC: 0 % (ref 0.0–0.2)

## 2023-01-28 LAB — COMPREHENSIVE METABOLIC PANEL
ALT: 29 U/L (ref 0–44)
AST: 26 U/L (ref 15–41)
Albumin: 3.5 g/dL (ref 3.5–5.0)
Alkaline Phosphatase: 94 U/L (ref 38–126)
Anion gap: 8 (ref 5–15)
BUN: 11 mg/dL (ref 8–23)
CO2: 31 mmol/L (ref 22–32)
Calcium: 9 mg/dL (ref 8.9–10.3)
Chloride: 98 mmol/L (ref 98–111)
Creatinine, Ser: 1.21 mg/dL (ref 0.61–1.24)
GFR, Estimated: 60 mL/min — ABNORMAL LOW (ref >60)
Glucose, Bld: 112 mg/dL — ABNORMAL HIGH (ref 70–99)
Potassium: 4.9 mmol/L (ref 3.5–5.1)
Sodium: 137 mmol/L (ref 135–145)
Total Bilirubin: 1.3 mg/dL — ABNORMAL HIGH (ref 0.3–1.2)
Total Protein: 7.2 g/dL (ref 6.5–8.1)

## 2023-01-28 LAB — GLUCOSE, CAPILLARY: Glucose-Capillary: 124 mg/dL — ABNORMAL HIGH (ref 70–99)

## 2023-01-28 MED ORDER — FINASTERIDE 5 MG PO TABS: 5.0000 mg | ORAL_TABLET | Freq: Every day | ORAL | 3 refills | Status: DC

## 2023-01-28 NOTE — Progress Notes (Signed)
No acute events overnight. Kellogg RN

## 2023-01-28 NOTE — Progress Notes (Signed)
MRI called to report that their machine is not working properly and [employer] has been called to look at it. Pt will not get MRI til AM . Karl Ito RN

## 2023-01-28 NOTE — Discharge Summary (Signed)
Physician Discharge Summary  Angel Dixon:[id number] DOB: Dec 22, 1940 DOA: 01/26/2023  PCP: [name], MD  Admit date: 01/26/2023  Discharge date: 01/28/2023  Admitted From:Home  Disposition:  Home  Recommendations for Outpatient Follow-up:  Follow up with PCP in 1-2 weeks Continue home medications as prior Follow up with Neurology outpatient with referral sent  Home Health:None  Equipment/Devices:None  Discharge Condition:Stable  CODE STATUS: Full  Diet recommendation: Heart Healthy/Carb Modified  Brief/Interim Summary: 82 year old male, PMH of CAD, on amiodarone for PVC suppression, DM 2, HLD, GERD, HTN, PUD, former tobacco use, reported occasional alcohol use, seen in the ED earlier on 01/26/2023 for generalized weakness, diagnosed to have COVID-19 infection, symptomatic treatment provided and patient discharged home, patient was then noted to have a witnessed staring spell/unresponsive episode lasting a few minutes at the kitchen table and patient was brought back to the ED for evaluation. As per EDP note, first responders noted 1 side of his body weaker than the other (which side not documented) but subsequently paramedics saw no focal weakness and he was awake and alert. Admitted for possible syncope versus seizures versus stroke evaluation. CUS, Echo, Brain MRI, and EEG within normal limits and patient is eager to go home. No abnormal findings on tele. He may follow up with Neurology outpatient with referral sent.  Discharge Diagnoses:  Principal Problem:   TIA (transient ischemic attack) Active Problems:   Essential hypertension   CAD (coronary artery disease)   Mixed hyperlipidemia   Hyperglycemia due to diabetes mellitus (HCC)   GERD (gastroesophageal reflux disease)   Benign prostatic hyperplasia with urinary obstruction   COVID-19 virus infection   Chronic kidney disease, stage 3a (HCC)   Overactive bladder   Atrial fibrillation, chronic  (HCC)  Possible syncopal episode.  Discharge Instructions  Discharge Instructions     Ambulatory referral to Neurology   Complete by: As directed    An appointment is requested in approximately: 8 weeks   Diet - low sodium heart healthy   Complete by: As directed    Increase activity slowly   Complete by: As directed       Allergies as of 01/28/2023       Reactions   Lisinopril Other (See Comments)   Sore throat, no obvious angioedema or cough   Sulfa Antibiotics Hives   Sulfacetamide Hives   Vioxx [rofecoxib] Hives   Yellow Jacket Venom [bee Venom] Swelling        Medication List     TAKE these medications    acetaminophen 500 MG tablet Commonly known as: TYLENOL Take 1 tablet (500 mg total) by mouth every 6 (six) hours as needed.   amiodarone 200 MG tablet Commonly known as: PACERONE Take 1 tablet (200 mg total) by mouth daily.   aspirin EC 81 MG tablet Take 1 tablet (81 mg total) by mouth daily with breakfast. Swallow whole.   atorvastatin 40 MG tablet Commonly known as: LIPITOR Take 1 tablet (40 mg total) by mouth daily.   benzonatate 100 MG capsule Commonly known as: TESSALON Take 1 capsule (100 mg total) by mouth every 8 (eight) hours.   BLINK TEARS OP Place 1 drop into both eyes 2 (two) times daily.   cetirizine 10 MG tablet Commonly known as: ZYRTEC Take 10 mg by mouth daily as needed for allergies.   dapagliflozin propanediol 5 MG Tabs tablet Commonly known as: Farxiga Take 1 tablet (5 mg total) by mouth daily.   finasteride 5 MG tablet Commonly known  as: PROSCAR Take 1 tablet (5 mg total) by mouth daily.   fluticasone 50 MCG/ACT nasal spray Commonly known as: FLONASE Place 1 spray into both nostrils as needed for allergies or rhinitis.   glipiZIDE 5 MG tablet Commonly known as: GLUCOTROL Take 1 tablet (5 mg total) by mouth 2 (two) times daily with a meal.   glipiZIDE 5 MG 24 hr tablet Commonly known as: GLUCOTROL XL Take 10 mg  by mouth daily.   isosorbide mononitrate 30 MG 24 hr tablet Commonly known as: IMDUR Take 0.5 tablets (15 mg total) by mouth daily.   meclizine 25 MG tablet Commonly known as: ANTIVERT Take 25 mg by mouth 3 (three) times daily as needed for dizziness.   metoprolol tartrate 25 MG tablet Commonly known as: LOPRESSOR Take 1 tablet (25 mg total) by mouth 2 (two) times daily. What changed: when to take this   mirabegron ER 25 MG Tb24 tablet Commonly known as: MYRBETRIQ Take 1 tablet (25 mg total) by mouth daily.   nitroGLYCERIN 0.4 MG SL tablet Commonly known as: NITROSTAT Place 1 tablet (0.4 mg total) under the tongue every 5 (five) minutes as needed for chest pain (severe chest pain or pressure only).   NovoLIN N 100 UNIT/ML injection Generic drug: insulin NPH Human Inject 10 Units into the skin 2 (two) times daily as needed (If Blood Sugar gets above 100).   OneTouch Ultra test strip Generic drug: glucose blood USE 1 STRIP TO CHECK BLOOD GLUCOSE LEVEL TWICE DAILY.   pantoprazole 40 MG tablet Commonly known as: PROTONIX Take 1 tablet (40 mg total) by mouth 2 (two) times daily.   silodosin 8 MG Caps capsule Commonly known as: RAPAFLO Take 1 capsule (8 mg total) by mouth at bedtime.        Follow-up Information     [name], MD. Schedule an appointment as soon as possible for a visit in 1 week(s).   Specialty: Family Medicine Contact information: [address] [place] [address] [phone]                Allergies  Allergen Reactions   Lisinopril Other (See Comments)    Sore throat, no obvious angioedema or cough   Sulfa Antibiotics Hives   Sulfacetamide Hives   Vioxx [Rofecoxib] Hives   Yellow Jacket Venom [Bee Venom] Swelling    Consultations: None   Procedures/Studies: EEG adult  Result Date: 02-17-2023 Charlsie Quest, MD     Feb 17, 2023  6:40 PM Patient Name: Angel Dixon MRN: [id number] Epilepsy Attending: Charlsie Quest Referring Physician/Provider: Elease Etienne, MD Date: 2023/02/17 Duration: 23.45 mins Patient history: 82yo M with syncope getting eeg to evaluate for seizure. Level of alertness: Awake, asleep AEDs during EEG study: None Technical aspects: This EEG study was done with scalp electrodes positioned according to the 10-20 International system of electrode placement. Electrical activity was reviewed with band pass filter of 1-70Hz , sensitivity of 7 uV/mm, display speed of 52mm/sec with a 60Hz  notched filter applied as appropriate. EEG data were recorded continuously and digitally stored.  Video monitoring was available and reviewed as appropriate. Description: The posterior dominant rhythm consists of 8-9 Hz activity of moderate voltage (25-35 uV) seen predominantly in posterior head regions, symmetric and reactive to eye opening and eye closing. Sleep was characterized by vertex waves, sleep spindles (12 to 14 Hz), maximal frontocentral region. Hyperventilation and photic stimulation were not performed.   IMPRESSION: This study is within normal limits. No  seizures or epileptiform discharges were seen throughout the recording. A normal interictal EEG does not exclude the diagnosis of epilepsy. Charlsie Quest   MR BRAIN WO CONTRAST  Result Date: 01/28/2023 CLINICAL DATA:  Provided history: Neuro deficit, acute, stroke suspected. EXAM: MRI HEAD WITHOUT CONTRAST MRA HEAD WITHOUT CONTRAST TECHNIQUE: Multiplanar, multi-echo pulse sequences of the brain and surrounding structures were acquired without intravenous contrast. Angiographic images of the Circle of Willis were acquired using MRA technique without intravenous contrast. COMPARISON:  Head CT 01/26/2023. FINDINGS: MRI HEAD FINDINGS Brain: No age advanced or lobar predominant parenchymal atrophy. Multifocal T2 FLAIR hyperintense signal abnormality within the cerebral white matter, nonspecific but compatible with moderate for age chronic small vessel  ischemic disease. There are a few punctate chronic microhemorrhages scattered within the supratentorial brain. There is no acute infarct. No evidence of an intracranial mass. No extra-axial fluid collection. No midline shift. Vascular: Maintained flow voids within the proximal large arterial vessels. Skull and upper cervical spine: No focal suspicious marrow lesion. Mild C3-C4 grade 1 anterolisthesis. Sinuses/Orbits: No mass or acute finding within the imaged orbits. Prior but ocular lens replacement. Moderate mucosal thickening within the bilateral maxillary sinuses. Mild mucosal thickening within the bilateral ethmoid and left frontal sinuses. MRA HEAD FINDINGS Anterior circulation: The intracranial internal carotid arteries are patent. The M1 middle cerebral arteries are patent. No M2 proximal branch occlusion or high-grade proximal stenosis. The anterior cerebral arteries are patent. 1-2 mm inferiorly projecting vascular true shin arising from the supraclinoid right ICA, which may reflect an aneurysm or infundibulum (series 11, image 94). 2 mm inferiorly projecting vascular protrusion arising from the supraclinoid left ICA, which may reflect an aneurysm or infundibulum (series 11, image 93). Posterior circulation: The intracranial vertebral arteries are patent. The basilar artery is patent. The posterior cerebral arteries are patent. Moderate stenosis within the right PCA at the P2/P3 junction. Posterior communicating arteries are diminutive or absent, bilaterally. Anatomic variants: As described. IMPRESSION: MRI brain: 1. No evidence of an acute intracranial abnormality. 2. Moderate severity chronic small vessel ischemic changes within the cerebral white matter. 3. Paranasal sinus disease as described. MRA head: 1. No intracranial large vessel occlusion. 2. 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 Electronically Signed   By: Jackey Loge D.O.   On: 01/28/2023 10:53    MR ANGIO HEAD WO CONTRAST  Result Date: 01/28/2023 CLINICAL DATA:  Provided history: Neuro deficit, acute, stroke suspected. EXAM: MRI HEAD WITHOUT CONTRAST MRA HEAD WITHOUT CONTRAST TECHNIQUE: Multiplanar, multi-echo pulse sequences of the brain and surrounding structures were acquired without intravenous contrast. Angiographic images of the Circle of Willis were acquired using MRA technique without intravenous contrast. COMPARISON:  Head CT 01/26/2023. FINDINGS: MRI HEAD FINDINGS Brain: No age advanced or lobar predominant parenchymal atrophy. Multifocal T2 FLAIR hyperintense signal abnormality within the cerebral white matter, nonspecific but compatible with moderate for age chronic small vessel ischemic disease. There are a few punctate chronic microhemorrhages scattered within the supratentorial brain. There is no acute infarct. No evidence of an intracranial mass. No extra-axial fluid collection. No midline shift. Vascular: Maintained flow voids within the proximal large arterial vessels. Skull and upper cervical spine: No focal suspicious marrow lesion. Mild C3-C4 grade 1 anterolisthesis. Sinuses/Orbits: No mass or acute finding within the imaged orbits. Prior but ocular lens replacement. Moderate mucosal thickening within the bilateral maxillary sinuses. Mild mucosal thickening within the bilateral ethmoid and left frontal sinuses. MRA HEAD FINDINGS Anterior circulation: The intracranial  internal carotid arteries are patent. The M1 middle cerebral arteries are patent. No M2 proximal branch occlusion or high-grade proximal stenosis. The anterior cerebral arteries are patent. 1-2 mm inferiorly projecting vascular true shin arising from the supraclinoid right ICA, which may reflect an aneurysm or infundibulum (series 11, image 94). 2 mm inferiorly projecting vascular protrusion arising from the supraclinoid left ICA, which may reflect an aneurysm or infundibulum (series 11, image 93). Posterior  circulation: The intracranial vertebral arteries are patent. The basilar artery is patent. The posterior cerebral arteries are patent. Moderate stenosis within the right PCA at the P2/P3 junction. Posterior communicating arteries are diminutive or absent, bilaterally. Anatomic variants: As described. IMPRESSION: MRI brain: 1. No evidence of an acute intracranial abnormality. 2. Moderate severity chronic small vessel ischemic changes within the cerebral white matter. 3. Paranasal sinus disease as described. MRA head: 1. No intracranial large vessel occlusion. 2. 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 Electronically Signed   By: Jackey Loge D.O.   On: 01/28/2023 10:53   ECHOCARDIOGRAM COMPLETE  Result Date: 01/27/2023    ECHOCARDIOGRAM REPORT   Patient Name:   ROCZEN KOEHNEN Date of Exam: 01/27/2023 Medical Rec #:  [id number]          Height:       68.0 in Accession #:    [id number]         Weight:       145.1 lb Date of Birth:  12-21-1940           BSA:          1.783 m Patient Age:    82 years           BP:           122/54 mmHg Patient Gender: M                  HR:           54 bpm. [facility] Procedure: 2D Echo, Cardiac Doppler and Color Doppler Indications:    TIA G45.9  History:        Patient has prior history of Echocardiogram examinations, most                 recent 11/06/2022. CAD and Previous Myocardial Infarction, TIA,                 Arrythmias:PVC and Atrial Fibrillation; Risk                 Factors:Hypertension, Diabetes and Dyslipidemia. COVID-19 virus                 infection, Chronic kidney disease, stage 3a (HCC),.  Sonographer:    Celesta Gentile RCS Referring Phys: [id number] OLADAPO ADEFESO IMPRESSIONS  1. Left ventricular ejection fraction, by estimation, is 70 to 75%. The left ventricle has hyperdynamic function. The left ventricle has no regional wall motion abnormalities. There is mild asymmetric left ventricular hypertrophy of  the basal-septal segment. Left ventricular diastolic parameters are consistent with Grade I diastolic dysfunction (impaired relaxation).  2. Right ventricular systolic function is hyperdynamic. The right ventricular size is normal. Tricuspid regurgitation signal is inadequate for assessing PA pressure.  3. The mitral valve is normal in structure. No evidence of mitral valve regurgitation. No evidence of mitral stenosis.  4. The aortic valve is tricuspid. There is mild calcification of the aortic valve. There is mild thickening of the aortic  valve. Aortic valve regurgitation is not visualized. Aortic valve sclerosis is present, with no evidence of aortic valve stenosis.  5. The inferior vena cava is normal in size with greater than 50% respiratory variability, suggesting right atrial pressure of 3 mmHg.  6. Cannot exclude a small PFO. Comparison(s): No significant change from prior study. FINDINGS  Left Ventricle: Left ventricular ejection fraction, by estimation, is 70 to 75%. The left ventricle has hyperdynamic function. The left ventricle has no regional wall motion abnormalities. The left ventricular internal cavity size was small. There is mild asymmetric left ventricular hypertrophy of the basal-septal segment. Left ventricular diastolic parameters are consistent with Grade I diastolic dysfunction (impaired relaxation). Right Ventricle: The right ventricular size is normal. No increase in right ventricular wall thickness. Right ventricular systolic function is hyperdynamic. Tricuspid regurgitation signal is inadequate for assessing PA pressure. Left Atrium: Left atrial size was normal in size. Right Atrium: Right atrial size was normal in size. Pericardium: Trivial pericardial effusion is present. The pericardial effusion is anterior to the right ventricle. Mitral Valve: The mitral valve is normal in structure. No evidence of mitral valve regurgitation. No evidence of mitral valve stenosis. Tricuspid Valve: The  tricuspid valve is normal in structure. Tricuspid valve regurgitation is not demonstrated. No evidence of tricuspid stenosis. Aortic Valve: The aortic valve is tricuspid. There is mild calcification of the aortic valve. There is mild thickening of the aortic valve. Aortic valve regurgitation is not visualized. Aortic valve sclerosis is present, with no evidence of aortic valve stenosis. Pulmonic Valve: The pulmonic valve was not well visualized. Pulmonic valve regurgitation is not visualized. No evidence of pulmonic stenosis. Aorta: The aortic root is normal in size and structure. Venous: The inferior vena cava is normal in size with greater than 50% respiratory variability, suggesting right atrial pressure of 3 mmHg. IAS/Shunts: Cannot exclude a small PFO.  LEFT VENTRICLE PLAX 2D LVIDd:         3.90 cm   Diastology LVIDs:         2.70 cm   LV e' medial:    7.07 cm/s LV PW:         1.10 cm   LV E/e' medial:  10.4 LV IVS:        1.00 cm   LV e' lateral:   5.44 cm/s LVOT diam:     1.90 cm   LV E/e' lateral: 13.6 LV SV:         74 LV SV Index:   42 LVOT Area:     2.84 cm  RIGHT VENTRICLE RV S prime:     16.80 cm/s TAPSE (M-mode): 2.5 cm LEFT ATRIUM             Index        RIGHT ATRIUM           Index LA diam:        2.80 cm 1.57 cm/m   RA Area:     14.40 cm LA Vol (A2C):   49.2 ml 27.59 ml/m  RA Volume:   38.00 ml  21.31 ml/m LA Vol (A4C):   47.4 ml 26.58 ml/m LA Biplane Vol: 49.7 ml 27.87 ml/m  AORTIC VALVE LVOT Vmax:   145.00 cm/s LVOT Vmean:  88.900 cm/s LVOT VTI:    0.261 m  AORTA Ao Root diam: 3.10 cm MITRAL VALVE MV Area (PHT): 3.12 cm     SHUNTS MV Decel Time: 243 msec     Systemic VTI:  0.26 m MV E velocity: 73.80 cm/s   Systemic Diam: 1.90 cm MV A velocity: 111.00 cm/s MV E/A ratio:  0.66 Riley Lam MD Electronically signed by Riley Lam MD Signature Date/Time: 01/27/2023/5:35:24 PM    Final    US Carotid Bilateral  Result Date: 01/27/2023 CLINICAL DATA:  TIA Hypertension CAD  Hyperlipidemia Diabetes Remote history of smoking EXAM: BILATERAL CAROTID DUPLEX ULTRASOUND TECHNIQUE: Wallace Cullens scale imaging, color Doppler and duplex ultrasound were performed of bilateral carotid and vertebral arteries in the neck. COMPARISON:  None available FINDINGS: Criteria: Quantification of carotid stenosis is based on velocity parameters that correlate the residual internal carotid diameter with NASCET-based stenosis levels, using the diameter of the distal internal carotid lumen as the denominator for stenosis measurement. The following velocity measurements were obtained: RIGHT ICA: 129/12 cm/sec CCA: 154/16 cm/sec SYSTOLIC ICA/CCA RATIO:  0.8 ECA: 186 cm/sec LEFT ICA: 117/21 cm/sec CCA: 151/17 cm/sec SYSTOLIC ICA/CCA RATIO:  0.8 ECA: 176 cm/sec RIGHT CAROTID ARTERY: Minimal mixed echogenicity plaque of the right carotid bulb and proximal internal carotid artery. RIGHT VERTEBRAL ARTERY:  Antegrade flow. LEFT CAROTID ARTERY: Minimal mixed echogenicity plaque of the left carotid bulb and proximal internal carotid artery. LEFT VERTEBRAL ARTERY:  Antegrade flow. IMPRESSION: Minimal mixed echogenicity plaque within the carotid bulbs and proximal internal carotid arteries with Doppler measurements most consistent with less than 50% stenosis. Electronically Signed   By: Acquanetta Belling M.D.   On: 01/27/2023 16:14   CT HEAD WO CONTRAST  Result Date: 01/26/2023 CLINICAL DATA:  Generalized weakness. Concern for CVA. Recent COVID diagnosis. EXAM: CT HEAD WITHOUT CONTRAST TECHNIQUE: Contiguous axial images were obtained from the base of the skull through the vertex without intravenous contrast. RADIATION DOSE REDUCTION: This exam was performed according to the departmental dose-optimization program which includes automated exposure control, adjustment of the mA and/or kV according to patient size and/or use of iterative reconstruction technique. COMPARISON:  None Available. FINDINGS: Brain: There is mild global  atrophy, mild atrophic ventriculomegaly. There is moderately advanced small-vessel disease of the cerebral white matter. No midline shift is seen. Basal cisterns are clear. There is no asymmetry concerning for an acute cortical based infarct, hemorrhage, mass or mass effect. Vascular: There patchy calcific plaques in the siphons, distal left vertebral artery. No hyperdense central vessels. Skull: Negative for fractures or focal lesions. Sinuses/Orbits: There is mild membrane thickening in the paranasal sinuses without fluid levels. There is no mastoid or middle ear fluid. Midline nasal septum. Unremarkable orbits apart from prior lens replacements. Other: None. IMPRESSION: 1. No acute intracranial CT findings. 2. Atrophy and moderately advanced small-vessel disease. 3. Sinus membrane disease. Electronically Signed   By: Almira Bar M.D.   On: 01/26/2023 21:52   DG Chest 2 View  Result Date: 01/26/2023 CLINICAL DATA:  Nonproductive cough EXAM: CHEST - 2 VIEW COMPARISON:  11/05/2022 FINDINGS: The heart size and mediastinal contours are within normal limits. Both lungs are clear. The visualized skeletal structures are unremarkable. IMPRESSION: No active cardiopulmonary disease. Electronically Signed   By: Charlett Nose M.D.   On: 01/26/2023 18:15     Discharge Exam: Vitals:   01/28/23 0429 01/28/23 1513  BP: (!) 109/59 111/64  Pulse: 77 79  Resp: 18 14  Temp: 98.9 F (37.2 C) 97.9 F (36.6 C)  SpO2: 98% 100%   Vitals:   01/27/23 1744 01/27/23 2037 01/28/23 0429 01/28/23 1513  BP:  136/79 (!) 109/59 111/64  Pulse:  85 77 79  Resp:  18 18  14  Temp:  99.6 F (37.6 C) 98.9 F (37.2 C) 97.9 F (36.6 C)  TempSrc:  Oral Oral Oral  SpO2: 99% 99% 98% 100%  Weight:      Height:        General: Pt is alert, awake, not in acute distress Cardiovascular: RRR, S1/S2 +, no rubs, no gallops Respiratory: CTA bilaterally, no wheezing, no rhonchi Abdominal: Soft, NT, ND, bowel sounds + Extremities:  no edema, no cyanosis    The results of significant diagnostics from this hospitalization (including imaging, microbiology, ancillary and laboratory) are listed below for reference.     Microbiology: Recent Results (from the past 240 hour(s))  SARS Coronavirus 2 by RT PCR (hospital order, performed in [facility] hospital lab) *cepheid single result test* Anterior Nasal Swab     Status: Abnormal   Collection Time: 01/26/23  5:23 PM   Specimen: Anterior Nasal Swab  Result Value Ref Range Status   SARS Coronavirus 2 by RT PCR POSITIVE (A) NEGATIVE Final    Comment: (NOTE) SARS-CoV-2 target nucleic acids are DETECTED  SARS-CoV-2 RNA is generally detectable in upper respiratory specimens  during the acute phase of infection.  Positive results are indicative  of the presence of the identified virus, but do not rule out bacterial infection or co-infection with other pathogens not detected by the test.  Clinical correlation with patient history and  other diagnostic information is necessary to determine patient infection status.  The expected result is negative.  Fact Sheet for Patients:   RoadLapTop.co.za   Fact Sheet for Healthcare Providers:   http://kim-miller.com/    This test is not yet approved or cleared by the [place] FDA and  has been authorized for detection and/or diagnosis of SARS-CoV-2 by FDA under an Emergency Use Authorization (EUA).  This EUA will remain in effect (meaning this test can be used) for the duration of  the COVID-19 declaration under Section 564(b)(1)  of the Act, 21 U.S.C. section 360-bbb-3(b)(1), unless the authorization is terminated or revoked sooner.   Performed at [facility], [address] [place], [place] [address]   Blood culture (routine x 2)     Status: None (Preliminary result)   Collection Time: 01/26/23 10:28 PM   Specimen: BLOOD  Result Value Ref Range Status   Specimen Description  BLOOD LEFT ANTECUBITAL  Final   Special Requests   Final    BOTTLES DRAWN AEROBIC AND ANAEROBIC Blood Culture adequate volume   Culture   Final    NO GROWTH 2 DAYS Performed at [facility], [address], [place], [place] [address]    Report Status PENDING  Incomplete  Blood culture (routine x 2)     Status: None (Preliminary result)   Collection Time: 01/26/23 10:28 PM   Specimen: BLOOD  Result Value Ref Range Status   Specimen Description BLOOD RIGHT ANTECUBITAL  Final   Special Requests   Final    BOTTLES DRAWN AEROBIC AND ANAEROBIC Blood Culture adequate volume   Culture   Final    NO GROWTH 2 DAYS Performed at [facility], [address], [place], [place] [address]    Report Status PENDING  Incomplete     Labs: BNP (last 3 results) No results for input(s): "BNP" in the last 8760 hours. Basic Metabolic Panel: Recent Labs  Lab 01/26/23 1752 01/26/23 2146 01/27/23 0504 01/28/23 0613  NA 132* 131* 132* 137  K 3.6 3.5 3.4* 4.9  CL 94* 95* 98 98  CO2  29 27 27 31   GLUCOSE 154* 162* 100* 112*  BUN 14 15 13 11   CREATININE 1.34* 1.40* 1.09 1.21  CALCIUM 8.3* 7.9* 7.9* 9.0  MG  --   --  1.8  --   PHOS  --   --  3.3  --    Liver Function Tests: Recent Labs  Lab 01/26/23 2146 01/27/23 0504 01/28/23 0613  AST 21 20 26   ALT 22 21 29   ALKPHOS 89 83 94  BILITOT 1.6* 1.5* 1.3*  PROT 6.5 6.3* 7.2  ALBUMIN 3.3* 3.1* 3.5   No results for input(s): "LIPASE", "AMYLASE" in the last 168 hours. No results for input(s): "AMMONIA" in the last 168 hours. CBC: Recent Labs  Lab 01/26/23 1752 01/27/23 0504 01/28/23 0613  WBC 8.0 6.4 4.5  HGB 11.5* 10.0* 11.7*  HCT 35.1* 31.2* 35.3*  MCV 87.3 87.2 86.5  PLT 217 185 208   Cardiac Enzymes: No results for input(s): "CKTOTAL", "CKMB", "CKMBINDEX", "TROPONINI" in the last 168 hours. BNP: Invalid input(s): "POCBNP" CBG: Recent Labs  Lab 01/27/23 0757 01/27/23 1236 01/27/23 1717 01/27/23 2112 01/28/23 0744  GLUCAP 79  184* 93 182* 124*   D-Dimer No results for input(s): "DDIMER" in the last 72 hours. Hgb A1c Recent Labs    01/27/23 0504  HGBA1C 8.9*   Lipid Profile Recent Labs    01/27/23 0504  CHOL 87  HDL 30*  LDLCALC 49  TRIG 39  CHOLHDL 2.9   Thyroid function studies No results for input(s): "TSH", "T4TOTAL", "T3FREE", "THYROIDAB" in the last 72 hours.  Invalid input(s): "FREET3" Anemia work up No results for input(s): "VITAMINB12", "FOLATE", "FERRITIN", "TIBC", "IRON", "RETICCTPCT" in the last 72 hours. Urinalysis    Component Value Date/Time   COLORURINE YELLOW 01/27/2023 0400   APPEARANCEUR CLEAR 01/27/2023 0400   APPEARANCEUR Clear 03/07/2022 0927   LABSPEC 1.006 01/27/2023 0400   PHURINE 6.0 01/27/2023 0400   GLUCOSEU NEGATIVE 01/27/2023 0400   HGBUR NEGATIVE 01/27/2023 0400   BILIRUBINUR NEGATIVE 01/27/2023 0400   BILIRUBINUR Negative 03/07/2022 0927   KETONESUR NEGATIVE 01/27/2023 0400   PROTEINUR NEGATIVE 01/27/2023 0400   UROBILINOGEN 0.2 12/20/2014 1314   NITRITE NEGATIVE 01/27/2023 0400   LEUKOCYTESUR NEGATIVE 01/27/2023 0400   Sepsis Labs Recent Labs  Lab 01/26/23 1752 01/27/23 0504 01/28/23 0613  WBC 8.0 6.4 4.5   Microbiology Recent Results (from the past 240 hour(s))  SARS Coronavirus 2 by RT PCR (hospital order, performed in [facility] hospital lab) *cepheid single result test* Anterior Nasal Swab     Status: Abnormal   Collection Time: 01/26/23  5:23 PM   Specimen: Anterior Nasal Swab  Result Value Ref Range Status   SARS Coronavirus 2 by RT PCR POSITIVE (A) NEGATIVE Final    Comment: (NOTE) SARS-CoV-2 target nucleic acids are DETECTED  SARS-CoV-2 RNA is generally detectable in upper respiratory specimens  during the acute phase of infection.  Positive results are indicative  of the presence of the identified virus, but do not rule out bacterial infection or co-infection with other pathogens not detected by the test.  Clinical correlation with  patient history and  other diagnostic information is necessary to determine patient infection status.  The expected result is negative.  Fact Sheet for Patients:   RoadLapTop.co.za   Fact Sheet for Healthcare Providers:   http://kim-miller.com/    This test is not yet approved or cleared by the [place] FDA and  has been authorized for detection and/or diagnosis of SARS-CoV-2 by  FDA under an Emergency Use Authorization (EUA).  This EUA will remain in effect (meaning this test can be used) for the duration of  the COVID-19 declaration under Section 564(b)(1)  of the Act, 21 U.S.C. section 360-bbb-3(b)(1), unless the authorization is terminated or revoked sooner.   Performed at [facility], [address], [place], [place] [address]   Blood culture (routine x 2)     Status: None (Preliminary result)   Collection Time: 01/26/23 10:28 PM   Specimen: BLOOD  Result Value Ref Range Status   Specimen Description BLOOD LEFT ANTECUBITAL  Final   Special Requests   Final    BOTTLES DRAWN AEROBIC AND ANAEROBIC Blood Culture adequate volume   Culture   Final    NO GROWTH 2 DAYS Performed at [facility], [address] [place], [place] [address]    Report Status PENDING  Incomplete  Blood culture (routine x 2)     Status: None (Preliminary result)   Collection Time: 01/26/23 10:28 PM   Specimen: BLOOD  Result Value Ref Range Status   Specimen Description BLOOD RIGHT ANTECUBITAL  Final   Special Requests   Final    BOTTLES DRAWN AEROBIC AND ANAEROBIC Blood Culture adequate volume   Culture   Final    NO GROWTH 2 DAYS Performed at [facility], [address], [place], [place] [address]    Report Status PENDING  Incomplete     Time coordinating discharge: 35 minutes  SIGNED:   Erick Blinks, DO [facility] 01/28/2023, 6:48 PM  If 7PM-7AM, please contact night-coverage [email or web address]

## 2023-01-28 NOTE — Progress Notes (Signed)
   01/28/23 1119  TOC Brief Assessment  Insurance and Status Reviewed  Patient has primary care physician Yes  Home environment has been reviewed from home with spouse  Prior level of function: independent  Prior/Current Home Services No current home services  Social Determinants of Health Reivew SDOH reviewed no interventions necessary  Readmission risk has been reviewed Yes  Transition of care needs no transition of care needs at this time   Transition of Care Department [facility]) has reviewed patient and no TOC needs have been identified at this time. We will continue to monitor patient advancement through interdisciplinary progression rounds. If new patient transition needs arise, please place a TOC consult.

## 2023-01-28 NOTE — Progress Notes (Signed)
OT Cancellation Note  Patient Details Name: Angel Dixon MRN: [id number] DOB: 10/06/1940   Cancelled Treatment:    Reason Eval/Treat Not Completed: Patient at procedure or test/ unavailable. Pt not at a procedure during the attempted evaluation. Will attempt to see pt later as time permits.   [name] OT, MOT  Danie Chandler 01/28/2023, 12:09 PM

## 2023-01-28 NOTE — Progress Notes (Signed)
PROGRESS NOTE    Angel Dixon  ZHY:[id number] DOB: 11-14-1940 DOA: 01/26/2023 PCP: [name], MD   Brief Narrative:    82 year old male, PMH of CAD, on amiodarone for PVC suppression, DM 2, HLD, GERD, HTN, PUD, former tobacco use, reported occasional alcohol use, seen in the ED earlier on 01/26/2023 for generalized weakness, diagnosed to have COVID-19 infection, symptomatic treatment provided and patient discharged home, patient was then noted to have a witnessed staring spell/unresponsive episode lasting a few minutes at the kitchen table and patient was brought back to the ED for evaluation. As per EDP note, first responders noted 1 side of his body weaker than the other (which side not documented) but subsequently paramedics saw no focal weakness and he was awake and alert. Admitted for possible syncope versus seizures versus stroke evaluation.   Assessment & Plan:   Principal Problem:   TIA (transient ischemic attack) Active Problems:   Essential hypertension   CAD (coronary artery disease)   Mixed hyperlipidemia   Hyperglycemia due to diabetes mellitus (HCC)   GERD (gastroesophageal reflux disease)   Benign prostatic hyperplasia with urinary obstruction   COVID-19 virus infection   Chronic kidney disease, stage 3a (HCC)   Overactive bladder   Atrial fibrillation, chronic (HCC)  Assessment and Plan:  Possible syncope (seems orthostatic) DD: Seizures, stroke, TIA versus others History from patient and family suggestive of orthostatic symptoms. CT head without acute findings. Check orthostatic vitals.  Telemetry shows sinus rhythm without arrhythmias.  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 Lactate and HS Troponin negative.  A1c 8.9 suggests poor control.  LDL 49 UDS positive for opiates Patient appears to be back to his independent baseline.  Therapies evaluation pending. Patient has been counseled in the presence  of his daughter that he must not drive for 6 months and he verbalizes understanding. Consider Neurology consultation pending workup as above   COVID-19 infection Mostly asymptomatic. Stable vital signs and not hypoxic. Continue airborne isolation.   Acute on stage II chronic kidney disease Baseline creatinine probably in the 1 range. Presented with creatinine of 1.34 > 1.4, may have been volume depleted. Resolved after IV fluids.   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 Had hypoglycemic episode with CBG of 56 this morning.  Hold Semglee Monitor closely and adjust insulins as needed Unclear as to his home regimen, awaiting pharmacy to perform home med rec.   Hypertension Normotensive off of meds currently. Awaiting home med rec review by pharmacy prior to considering resuming any antihypertensives that he may be on at home   Mixed hyperlipidemia Continue atorvastatin 40 mg daily   Anemia: Hemoglobin may be near his baseline, follow CBC in a.m.   Hypokalemia Replace and follow.  Magnesium normal.   CAD Asymptomatic of angina Pending home med rec, resume aspirin, statins and beta-blockers if he is on that   BPH Continue finasteride.   GERD/PUD history Continue PPI   Body mass index is 22.06 kg/m.    DVT prophylaxis:SCDs Code Status: Full Family Communication: Daughter 8/26 Disposition Plan:  Status is: Observation The patient remains OBS appropriate and will d/c before 2 midnights.  Consultants:  None  Procedures:  EEG pending  Antimicrobials:  None   Subjective: Patient seen and evaluated today with no new acute complaints or concerns. No acute concerns or events noted overnight. He is eager to go home,  but eeg results are pending.  Objective: Vitals:   01/27/23 1744 01/27/23 2037 01/28/23 0429 01/28/23 1513  BP:  136/79 (!) 109/59 111/64  Pulse:  85 77 79  Resp:  18 18 14   Temp:  99.6 F (37.6 C) 98.9 F (37.2 C) 97.9 F (36.6 C)  TempSrc:  Oral Oral Oral  SpO2: 99% 99% 98% 100%  Weight:      Height:        Intake/Output Summary (Last 24 hours) at 01/28/2023 1746 Last data filed at 01/28/2023 1300 Gross per 24 hour  Intake 720 ml  Output --  Net 720 ml   Filed Weights   01/26/23 2058  Weight: 65.8 kg    Examination:  General exam: Appears calm and comfortable  Respiratory system: Clear to auscultation. Respiratory effort normal. Cardiovascular system: S1 & S2 heard, RRR.  Gastrointestinal system: Abdomen is soft Central nervous system: Alert and awake Extremities: No edema Skin: No significant lesions noted Psychiatry: Flat affect.    Data Reviewed: I have personally reviewed following labs and imaging studies  CBC: Recent Labs  Lab 01/26/23 1752 01/27/23 0504 01/28/23 0613  WBC 8.0 6.4 4.5  HGB 11.5* 10.0* 11.7*  HCT 35.1* 31.2* 35.3*  MCV 87.3 87.2 86.5  PLT 217 185 208   Basic Metabolic Panel: Recent Labs  Lab 01/26/23 1752 01/26/23 2146 01/27/23 0504 01/28/23 0613  NA 132* 131* 132* 137  K 3.6 3.5 3.4* 4.9  CL 94* 95* 98 98  CO2 29 27 27 31   GLUCOSE 154* 162* 100* 112*  BUN 14 15 13 11   CREATININE 1.34* 1.40* 1.09 1.21  CALCIUM 8.3* 7.9* 7.9* 9.0  MG  --   --  1.8  --   PHOS  --   --  3.3  --    GFR: Estimated Creatinine Clearance: 43.8 mL/min (by C-G formula based on SCr of 1.21 mg/dL). Liver Function Tests: Recent Labs  Lab 01/26/23 2146 01/27/23 0504 01/28/23 0613  AST 21 20 26   ALT 22 21 29   ALKPHOS 89 83 94  BILITOT 1.6* 1.5* 1.3*  PROT 6.5 6.3* 7.2  ALBUMIN 3.3* 3.1* 3.5   No results for input(s): "LIPASE", "AMYLASE" in the last 168 hours. No results for input(s): "AMMONIA" in the last 168 hours. Coagulation  Profile: Recent Labs  Lab 01/26/23 2146  INR 1.2   Cardiac Enzymes: No results for input(s): "CKTOTAL", "CKMB", "CKMBINDEX", "TROPONINI" in the last 168 hours. BNP (last 3 results) No results for input(s): "PROBNP" in the last 8760 hours. HbA1C: Recent Labs    01/27/23 0504  HGBA1C 8.9*   CBG: Recent Labs  Lab 01/27/23 0757 01/27/23 1236 01/27/23 1717 01/27/23 2112 01/28/23 0744  GLUCAP 79 184* 93 182* 124*   Lipid Profile: Recent Labs    01/27/23 0504  CHOL 87  HDL 30*  LDLCALC 49  TRIG 39  CHOLHDL 2.9   Thyroid Function Tests: No results for input(s): "TSH", "T4TOTAL", "FREET4", "T3FREE", "THYROIDAB" in the last 72 hours. Anemia Panel: No results for input(s): "VITAMINB12", "FOLATE", "FERRITIN", "TIBC", "IRON", "RETICCTPCT" in the last 72 hours. Sepsis Labs: Recent Labs  Lab 01/26/23 2146 01/27/23 0052  LATICACIDVEN 1.2 1.2    Recent Results (from the past 240 hour(s))  SARS Coronavirus 2 by RT PCR (hospital order, performed in  [facility] hospital lab) *cepheid single result test* Anterior Nasal Swab     Status: Abnormal   Collection Time: 01/26/23  5:23 PM   Specimen: Anterior Nasal Swab  Result Value Ref Range Status   SARS Coronavirus 2 by RT PCR POSITIVE (A) NEGATIVE Final    Comment: (NOTE) SARS-CoV-2 target nucleic acids are DETECTED  SARS-CoV-2 RNA is generally detectable in upper respiratory specimens  during the acute phase of infection.  Positive results are indicative  of the presence of the identified virus, but do not rule out bacterial infection or co-infection with other pathogens not detected by the test.  Clinical correlation with patient history and  other diagnostic information is necessary to determine patient infection status.  The expected result is negative.  Fact Sheet for Patients:   RoadLapTop.co.za   Fact Sheet for Healthcare Providers:   http://kim-miller.com/    This test  is not yet approved or cleared by the [place] FDA and  has been authorized for detection and/or diagnosis of SARS-CoV-2 by FDA under an Emergency Use Authorization (EUA).  This EUA will remain in effect (meaning this test can be used) for the duration of  the COVID-19 declaration under Section 564(b)(1)  of the Act, 21 U.S.C. section 360-bbb-3(b)(1), unless the authorization is terminated or revoked sooner.   Performed at [facility], [address], [place], [place] [address]   Blood culture (routine x 2)     Status: None (Preliminary result)   Collection Time: 01/26/23 10:28 PM   Specimen: BLOOD  Result Value Ref Range Status   Specimen Description BLOOD LEFT ANTECUBITAL  Final   Special Requests   Final    BOTTLES DRAWN AEROBIC AND ANAEROBIC Blood Culture adequate volume   Culture   Final    NO GROWTH 2 DAYS Performed at [facility], [address] [place], [place] [address]    Report Status PENDING  Incomplete  Blood culture (routine x 2)     Status: None (Preliminary result)   Collection Time: 01/26/23 10:28 PM   Specimen: BLOOD  Result Value Ref Range Status   Specimen Description BLOOD RIGHT ANTECUBITAL  Final   Special Requests   Final    BOTTLES DRAWN AEROBIC AND ANAEROBIC Blood Culture adequate volume   Culture   Final    NO GROWTH 2 DAYS Performed at [facility], [address] [place], [place] [address]    Report Status PENDING  Incomplete         Radiology Studies: MR BRAIN WO CONTRAST  Result Date: 01/28/2023 CLINICAL DATA:  Provided history: Neuro deficit, acute, stroke suspected. EXAM: MRI HEAD WITHOUT CONTRAST MRA HEAD WITHOUT CONTRAST TECHNIQUE: Multiplanar, multi-echo pulse sequences of the brain and surrounding structures were acquired without intravenous contrast. Angiographic images of the Circle of Willis were acquired using MRA technique without intravenous contrast. COMPARISON:  Head CT 01/26/2023. FINDINGS: MRI HEAD FINDINGS Brain: No age  advanced or lobar predominant parenchymal atrophy. Multifocal T2 FLAIR hyperintense signal abnormality within the cerebral white matter, nonspecific but compatible with moderate for age chronic small vessel ischemic disease. There are a few punctate chronic microhemorrhages scattered within the supratentorial brain. There is no acute infarct. No evidence of an intracranial mass. No extra-axial fluid collection. No midline shift. Vascular: Maintained flow voids within the proximal large arterial vessels. Skull and upper cervical spine: No focal suspicious marrow lesion. Mild C3-C4 grade 1 anterolisthesis. Sinuses/Orbits: No mass or acute finding within the imaged orbits. Prior but ocular lens  replacement. Moderate mucosal thickening within the bilateral maxillary sinuses. Mild mucosal thickening within the bilateral ethmoid and left frontal sinuses. MRA HEAD FINDINGS Anterior circulation: The intracranial internal carotid arteries are patent. The M1 middle cerebral arteries are patent. No M2 proximal branch occlusion or high-grade proximal stenosis. The anterior cerebral arteries are patent. 1-2 mm inferiorly projecting vascular true shin arising from the supraclinoid right ICA, which may reflect an aneurysm or infundibulum (series 11, image 94). 2 mm inferiorly projecting vascular protrusion arising from the supraclinoid left ICA, which may reflect an aneurysm or infundibulum (series 11, image 93). Posterior circulation: The intracranial vertebral arteries are patent. The basilar artery is patent. The posterior cerebral arteries are patent. Moderate stenosis within the right PCA at the P2/P3 junction. Posterior communicating arteries are diminutive or absent, bilaterally. Anatomic variants: As described. IMPRESSION: MRI brain: 1. No evidence of an acute intracranial abnormality. 2. Moderate severity chronic small vessel ischemic changes within the cerebral white matter. 3. Paranasal sinus disease as described. MRA  head: 1. No intracranial large vessel occlusion. 2. 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 Electronically Signed   By: Jackey Loge D.O.   On: 01/28/2023 10:53   MR ANGIO HEAD WO CONTRAST  Result Date: 01/28/2023 CLINICAL DATA:  Provided history: Neuro deficit, acute, stroke suspected. EXAM: MRI HEAD WITHOUT CONTRAST MRA HEAD WITHOUT CONTRAST TECHNIQUE: Multiplanar, multi-echo pulse sequences of the brain and surrounding structures were acquired without intravenous contrast. Angiographic images of the Circle of Willis were acquired using MRA technique without intravenous contrast. COMPARISON:  Head CT 01/26/2023. FINDINGS: MRI HEAD FINDINGS Brain: No age advanced or lobar predominant parenchymal atrophy. Multifocal T2 FLAIR hyperintense signal abnormality within the cerebral white matter, nonspecific but compatible with moderate for age chronic small vessel ischemic disease. There are a few punctate chronic microhemorrhages scattered within the supratentorial brain. There is no acute infarct. No evidence of an intracranial mass. No extra-axial fluid collection. No midline shift. Vascular: Maintained flow voids within the proximal large arterial vessels. Skull and upper cervical spine: No focal suspicious marrow lesion. Mild C3-C4 grade 1 anterolisthesis. Sinuses/Orbits: No mass or acute finding within the imaged orbits. Prior but ocular lens replacement. Moderate mucosal thickening within the bilateral maxillary sinuses. Mild mucosal thickening within the bilateral ethmoid and left frontal sinuses. MRA HEAD FINDINGS Anterior circulation: The intracranial internal carotid arteries are patent. The M1 middle cerebral arteries are patent. No M2 proximal branch occlusion or high-grade proximal stenosis. The anterior cerebral arteries are patent. 1-2 mm inferiorly projecting vascular true shin arising from the supraclinoid right ICA, which may reflect an aneurysm or  infundibulum (series 11, image 94). 2 mm inferiorly projecting vascular protrusion arising from the supraclinoid left ICA, which may reflect an aneurysm or infundibulum (series 11, image 93). Posterior circulation: The intracranial vertebral arteries are patent. The basilar artery is patent. The posterior cerebral arteries are patent. Moderate stenosis within the right PCA at the P2/P3 junction. Posterior communicating arteries are diminutive or absent, bilaterally. Anatomic variants: As described. IMPRESSION: MRI brain: 1. No evidence of an acute intracranial abnormality. 2. Moderate severity chronic small vessel ischemic changes within the cerebral white matter. 3. Paranasal sinus disease as described. MRA head: 1. No intracranial large vessel occlusion. 2. 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 Electronically Signed   By: Jackey Loge D.O.   On: 01/28/2023 10:53   ECHOCARDIOGRAM COMPLETE  Result Date: 01/27/2023    ECHOCARDIOGRAM REPORT  Patient Name:   Angel Dixon Date of Exam: 01/27/2023 Medical Rec #:  [id number]          Height:       68.0 in Accession #:    [id number]         Weight:       145.1 lb Date of Birth:  1940-07-03           BSA:          1.783 m Patient Age:    82 years           BP:           122/54 mmHg Patient Gender: M                  HR:           54 bpm. [facility] Procedure: 2D Echo, Cardiac Doppler and Color Doppler Indications:    TIA G45.9  History:        Patient has prior history of Echocardiogram examinations, most                 recent 11/06/2022. CAD and Previous Myocardial Infarction, TIA,                 Arrythmias:PVC and Atrial Fibrillation; Risk                 Factors:Hypertension, Diabetes and Dyslipidemia. COVID-19 virus                 infection, Chronic kidney disease, stage 3a (HCC),.  Sonographer:    Celesta Gentile RCS Referring Phys: [id number] OLADAPO ADEFESO IMPRESSIONS  1. Left ventricular ejection  fraction, by estimation, is 70 to 75%. The left ventricle has hyperdynamic function. The left ventricle has no regional wall motion abnormalities. There is mild asymmetric left ventricular hypertrophy of the basal-septal segment. Left ventricular diastolic parameters are consistent with Grade I diastolic dysfunction (impaired relaxation).  2. Right ventricular systolic function is hyperdynamic. The right ventricular size is normal. Tricuspid regurgitation signal is inadequate for assessing PA pressure.  3. The mitral valve is normal in structure. No evidence of mitral valve regurgitation. No evidence of mitral stenosis.  4. The aortic valve is tricuspid. There is mild calcification of the aortic valve. There is mild thickening of the aortic valve. Aortic valve regurgitation is not visualized. Aortic valve sclerosis is present, with no evidence of aortic valve stenosis.  5. The inferior vena cava is normal in size with greater than 50% respiratory variability, suggesting right atrial pressure of 3 mmHg.  6. Cannot exclude a small PFO. Comparison(s): No significant change from prior study. FINDINGS  Left Ventricle: Left ventricular ejection fraction, by estimation, is 70 to 75%. The left ventricle has hyperdynamic function. The left ventricle has no regional wall motion abnormalities. The left ventricular internal cavity size was small. There is mild asymmetric left ventricular hypertrophy of the basal-septal segment. Left ventricular diastolic parameters are consistent with Grade I diastolic dysfunction (impaired relaxation). Right Ventricle: The right ventricular size is normal. No increase in right ventricular wall thickness. Right ventricular systolic function is hyperdynamic. Tricuspid regurgitation signal is inadequate for assessing PA pressure. Left Atrium: Left atrial size was normal in size. Right Atrium: Right atrial size was normal in size. Pericardium: Trivial pericardial effusion is present. The  pericardial effusion is anterior to the right ventricle. Mitral Valve: The mitral valve is normal in structure. No evidence of mitral valve regurgitation.  No evidence of mitral valve stenosis. Tricuspid Valve: The tricuspid valve is normal in structure. Tricuspid valve regurgitation is not demonstrated. No evidence of tricuspid stenosis. Aortic Valve: The aortic valve is tricuspid. There is mild calcification of the aortic valve. There is mild thickening of the aortic valve. Aortic valve regurgitation is not visualized. Aortic valve sclerosis is present, with no evidence of aortic valve stenosis. Pulmonic Valve: The pulmonic valve was not well visualized. Pulmonic valve regurgitation is not visualized. No evidence of pulmonic stenosis. Aorta: The aortic root is normal in size and structure. Venous: The inferior vena cava is normal in size with greater than 50% respiratory variability, suggesting right atrial pressure of 3 mmHg. IAS/Shunts: Cannot exclude a small PFO.  LEFT VENTRICLE PLAX 2D LVIDd:         3.90 cm   Diastology LVIDs:         2.70 cm   LV e' medial:    7.07 cm/s LV PW:         1.10 cm   LV E/e' medial:  10.4 LV IVS:        1.00 cm   LV e' lateral:   5.44 cm/s LVOT diam:     1.90 cm   LV E/e' lateral: 13.6 LV SV:         74 LV SV Index:   42 LVOT Area:     2.84 cm  RIGHT VENTRICLE RV S prime:     16.80 cm/s TAPSE (M-mode): 2.5 cm LEFT ATRIUM             Index        RIGHT ATRIUM           Index LA diam:        2.80 cm 1.57 cm/m   RA Area:     14.40 cm LA Vol (A2C):   49.2 ml 27.59 ml/m  RA Volume:   38.00 ml  21.31 ml/m LA Vol (A4C):   47.4 ml 26.58 ml/m LA Biplane Vol: 49.7 ml 27.87 ml/m  AORTIC VALVE LVOT Vmax:   145.00 cm/s LVOT Vmean:  88.900 cm/s LVOT VTI:    0.261 m  AORTA Ao Root diam: 3.10 cm MITRAL VALVE MV Area (PHT): 3.12 cm     SHUNTS MV Decel Time: 243 msec     Systemic VTI:  0.26 m MV E velocity: 73.80 cm/s   Systemic Diam: 1.90 cm MV A velocity: 111.00 cm/s MV E/A ratio:  0.66  Riley Lam MD Electronically signed by Riley Lam MD Signature Date/Time: 01/27/2023/5:35:24 PM    Final    US Carotid Bilateral  Result Date: 01/27/2023 CLINICAL DATA:  TIA Hypertension CAD Hyperlipidemia Diabetes Remote history of smoking EXAM: BILATERAL CAROTID DUPLEX ULTRASOUND TECHNIQUE: Wallace Cullens scale imaging, color Doppler and duplex ultrasound were performed of bilateral carotid and vertebral arteries in the neck. COMPARISON:  None available FINDINGS: Criteria: Quantification of carotid stenosis is based on velocity parameters that correlate the residual internal carotid diameter with NASCET-based stenosis levels, using the diameter of the distal internal carotid lumen as the denominator for stenosis measurement. The following velocity measurements were obtained: RIGHT ICA: 129/12 cm/sec CCA: 154/16 cm/sec SYSTOLIC ICA/CCA RATIO:  0.8 ECA: 186 cm/sec LEFT ICA: 117/21 cm/sec CCA: 151/17 cm/sec SYSTOLIC ICA/CCA RATIO:  0.8 ECA: 176 cm/sec RIGHT CAROTID ARTERY: Minimal mixed echogenicity plaque of the right carotid bulb and proximal internal carotid artery. RIGHT VERTEBRAL ARTERY:  Antegrade flow. LEFT CAROTID ARTERY: Minimal mixed echogenicity plaque of  the left carotid bulb and proximal internal carotid artery. LEFT VERTEBRAL ARTERY:  Antegrade flow. IMPRESSION: Minimal mixed echogenicity plaque within the carotid bulbs and proximal internal carotid arteries with Doppler measurements most consistent with less than 50% stenosis. Electronically Signed   By: Acquanetta Belling M.D.   On: 01/27/2023 16:14   CT HEAD WO CONTRAST  Result Date: 01/26/2023 CLINICAL DATA:  Generalized weakness. Concern for CVA. Recent COVID diagnosis. EXAM: CT HEAD WITHOUT CONTRAST TECHNIQUE: Contiguous axial images were obtained from the base of the skull through the vertex without intravenous contrast. RADIATION DOSE REDUCTION: This exam was performed according to the departmental dose-optimization program which  includes automated exposure control, adjustment of the mA and/or kV according to patient size and/or use of iterative reconstruction technique. COMPARISON:  None Available. FINDINGS: Brain: There is mild global atrophy, mild atrophic ventriculomegaly. There is moderately advanced small-vessel disease of the cerebral white matter. No midline shift is seen. Basal cisterns are clear. There is no asymmetry concerning for an acute cortical based infarct, hemorrhage, mass or mass effect. Vascular: There patchy calcific plaques in the siphons, distal left vertebral artery. No hyperdense central vessels. Skull: Negative for fractures or focal lesions. Sinuses/Orbits: There is mild membrane thickening in the paranasal sinuses without fluid levels. There is no mastoid or middle ear fluid. Midline nasal septum. Unremarkable orbits apart from prior lens replacements. Other: None. IMPRESSION: 1. No acute intracranial CT findings. 2. Atrophy and moderately advanced small-vessel disease. 3. Sinus membrane disease. Electronically Signed   By: Almira Bar M.D.   On: 01/26/2023 21:52   DG Chest 2 View  Result Date: 01/26/2023 CLINICAL DATA:  Nonproductive cough EXAM: CHEST - 2 VIEW COMPARISON:  11/05/2022 FINDINGS: The heart size and mediastinal contours are within normal limits. Both lungs are clear. The visualized skeletal structures are unremarkable. IMPRESSION: No active cardiopulmonary disease. Electronically Signed   By: Charlett Nose M.D.   On: 01/26/2023 18:15        Scheduled Meds:  amiodarone  200 mg Oral Daily   aspirin EC  81 mg Oral Q breakfast   atorvastatin  40 mg Oral Daily   benzonatate  100 mg Oral Q8H   dapagliflozin propanediol  5 mg Oral Daily   feeding supplement (GLUCERNA SHAKE)  237 mL Oral TID BM   finasteride  5 mg Oral Daily   insulin aspart  0-9 Units Subcutaneous TID WC   metoprolol tartrate  25 mg Oral BID   mirabegron ER  25 mg Oral Daily   pantoprazole  40 mg Oral BID    polyvinyl alcohol  1 drop Both Eyes BID     LOS: 0 days    Time spent: 35 minutes    Brenna Friesenhahn Hoover Brunette, DO [facility]  If 7PM-7AM, please contact night-coverage [email or web address] 01/28/2023, 5:46 PM

## 2023-01-28 NOTE — Progress Notes (Signed)
SLP Cancellation Note  Patient Details Name: CARBON TREADWAY MRN: [id number] DOB: 1941-01-19   Cancelled treatment:       Reason Eval/Treat Not Completed: SLP screened, no needs identified, will sign off (RN reports no issues with eating and swallowing, MRI negative. Pt is receiving EEG and will likely be d/c home after per RN. SLP will sign off. Reconsult if indicated.)  Thank you,  Havery Moros, CCC-SLP [phone]  Verneta Hamidi 01/28/2023, 5:46 PM

## 2023-01-28 NOTE — Evaluation (Signed)
Physical Therapy Evaluation Patient Details Name: SHADRACH GHAFOOR MRN: [id number] DOB: 06/22/1940 Today's Date: 01/28/2023  History of Present Illness  Trip GLEEN NORTZ is a 82 y.o. male with medical history significant of hypertension, hyperlipidemia, chronic A-fib, T2DM, GERD/PUD who presents to the emergency department via EMS due to generalized weakness.  Patient was seen earlier today in the ED and was diagnosed to have COVID-19 virus infection, symptomatic treatment with Tylenol and benzonatate was given, patient felt better and was discharged home.  On getting home, patient had a family gathering despite having ongoing COVID-19 infection, patient was subsequently witnessed family members to become unresponsive at the kidney table and was noted with some drooling and also unresponsive to voice for about 10 minutes.  No seizure activity was noted.  EMS was activated and on arrival of EMS team, no focal weakness was noted and patient was already awake and alert.  IV fluid was given en route to the hospital.  Patient denies chest pain, shortness of breath, headache, vision changes, abdominal pain.  Patient endorsed being back to his baseline of functioning   Clinical Impression  Patient functioning at baseline for functional mobility and gait demonstrating good return for walking in room without problem.  Plan:  Patient discharged from physical therapy to care of nursing for ambulation daily as tolerated for length of stay.          If plan is discharge home, recommend the following: Help with stairs or ramp for entrance;Assistance with cooking/housework   Can travel by private vehicle        Equipment Recommendations None recommended by PT  Recommendations for Other Services       Functional Status Assessment Patient has not had a recent decline in their functional status     Precautions / Restrictions Precautions Precautions: None Restrictions Weight Bearing Restrictions: No       Mobility  Bed Mobility Overal bed mobility: Independent                  Transfers Overall transfer level: Independent                      Ambulation/Gait Ambulation/Gait assistance: Modified independent (Device/Increase time) Gait Distance (Feet): 40 Feet   Gait Pattern/deviations: WFL(Within Functional Limits) Gait velocity: near normal     General Gait Details: grossly WFL with good return for ambulating in room without need for an AD and no loss of balance  Stairs            Wheelchair Mobility     Tilt Bed    Modified Rankin (Stroke Patients Only)       Balance Overall balance assessment: No apparent balance deficits (not formally assessed)                                           Pertinent Vitals/Pain Pain Assessment Pain Assessment: No/denies pain    Home Living Family/patient expects to be discharged to:: Private residence Living Arrangements: Spouse/significant other Available Help at Discharge: Family;Available 24 hours/day Type of Home: House Home Access: Level entry       Home Layout: One level Home Equipment: BSC/3in1;Grab bars - toilet;Rolling Walker (2 wheels);Cane - single point      Prior Function Prior Level of Function : Independent/Modified Independent;Driving  Mobility Comments: Community ambulator without AD, drives ADLs Comments: Independent     Extremity/Trunk Assessment   Upper Extremity Assessment Upper Extremity Assessment: Defer to OT evaluation    Lower Extremity Assessment Lower Extremity Assessment: Overall WFL for tasks assessed    Cervical / Trunk Assessment Cervical / Trunk Assessment: Normal  Communication   Communication Communication: No apparent difficulties  Cognition Arousal: Alert   Overall Cognitive Status: Within Functional Limits for tasks assessed                                          General Comments       Exercises     Assessment/Plan    PT Assessment Patient does not need any further PT services  PT Problem List         PT Treatment Interventions      PT Goals (Current goals can be found in the Care Plan section)  Acute Rehab PT Goals Patient Stated Goal: return home with family to assist PT Goal Formulation: With patient/family Time For Goal Achievement: 01/28/23 Potential to Achieve Goals: Good    Frequency       Co-evaluation               AM-PAC PT "6 Clicks" Mobility  Outcome Measure Help needed turning from your back to your side while in a flat bed without using bedrails?: None Help needed moving from lying on your back to sitting on the side of a flat bed without using bedrails?: None Help needed moving to and from a bed to a chair (including a wheelchair)?: None Help needed standing up from a chair using your arms (e.g., wheelchair or bedside chair)?: None Help needed to walk in hospital room?: None Help needed climbing 3-5 steps with a railing? : A Little 6 Click Score: 23    End of Session   Activity Tolerance: Patient tolerated treatment well Patient left: in bed;with call bell/phone within reach;with family/visitor present Nurse Communication: Mobility status PT Visit Diagnosis: Unsteadiness on feet (R26.81);Other abnormalities of gait and mobility (R26.89);Muscle weakness (generalized) (M62.81)    Time: 5284-1324 PT Time Calculation (min) (ACUTE ONLY): 11 min   Charges:   PT Evaluation $PT Eval Low Complexity: 1 Low PT Treatments $Therapeutic Activity: 8-22 mins PT General Charges $$ ACUTE PT VISIT: 1 Visit         2:17 PM, 01/28/23 Ocie Bob, MPT Physical Therapist with [facility] [phone] office [phone] mobile phone

## 2023-01-28 NOTE — Progress Notes (Signed)
EEG complete - results pending 

## 2023-01-28 NOTE — Plan of Care (Signed)

## 2023-01-28 NOTE — Procedures (Signed)
Patient Name: Angel Dixon  MRN: [id number]  Epilepsy Attending: Charlsie Quest  Referring Physician/Provider: Elease Etienne, MD  Date: 01/28/2023 Duration: 23.45 mins  Patient history: 82yo M with syncope getting eeg to evaluate for seizure.  Level of alertness: Awake, asleep  AEDs during EEG study: None  Technical aspects: This EEG study was done with scalp electrodes positioned according to the 10-20 International system of electrode placement. Electrical activity was reviewed with band pass filter of 1-70Hz , sensitivity of 7 uV/mm, display speed of 28mm/sec with a 60Hz  notched filter applied as appropriate. EEG data were recorded continuously and digitally stored.  Video monitoring was available and reviewed as appropriate.  Description: The posterior dominant rhythm consists of 8-9 Hz activity of moderate voltage (25-35 uV) seen predominantly in posterior head regions, symmetric and reactive to eye opening and eye closing. Sleep was characterized by vertex waves, sleep spindles (12 to 14 Hz), maximal frontocentral region. Hyperventilation and photic stimulation were not performed.     IMPRESSION: This study is within normal limits. No seizures or epileptiform discharges were seen throughout the recording.  A normal interictal EEG does not exclude the diagnosis of epilepsy.  Luiza Carranco Annabelle Harman

## 2023-01-29 ENCOUNTER — Other Ambulatory Visit: Payer: Self-pay

## 2023-01-29 ENCOUNTER — Emergency Department (HOSPITAL_COMMUNITY): Payer: Medicare HMO

## 2023-01-29 ENCOUNTER — Emergency Department (HOSPITAL_COMMUNITY)
Admission: EM | Admit: 2023-01-29 | Discharge: 2023-01-30 | Disposition: A | Discharge status: Home / Self Care | Payer: Medicare HMO | Attending: Emergency Medicine | Admitting: Emergency Medicine

## 2023-01-29 DIAGNOSIS — R55 Syncope and collapse: Secondary | ICD-10-CM | POA: Diagnosis present

## 2023-01-29 DIAGNOSIS — Z794 Long term (current) use of insulin: Secondary | ICD-10-CM | POA: Insufficient documentation

## 2023-01-29 DIAGNOSIS — R7989 Other specified abnormal findings of blood chemistry: Secondary | ICD-10-CM | POA: Diagnosis not present

## 2023-01-29 DIAGNOSIS — Z7982 Long term (current) use of aspirin: Secondary | ICD-10-CM | POA: Diagnosis not present

## 2023-01-29 DIAGNOSIS — R42 Dizziness and giddiness: Secondary | ICD-10-CM | POA: Diagnosis not present

## 2023-01-29 LAB — CBC WITH DIFFERENTIAL/PLATELET
Abs Immature Granulocytes: 0.03 10*3/uL (ref 0.00–0.07)
Basophils Absolute: 0 10*3/uL (ref 0.0–0.1)
Basophils Relative: 0 %
Eosinophils Absolute: 0.2 10*3/uL (ref 0.0–0.5)
Eosinophils Relative: 3 %
HCT: 34.8 % — ABNORMAL LOW (ref 39.0–52.0)
Hemoglobin: 11.3 g/dL — ABNORMAL LOW (ref 13.0–17.0)
Immature Granulocytes: 1 %
Lymphocytes Relative: 18 %
Lymphs Abs: 0.9 10*3/uL (ref 0.7–4.0)
MCH: 28.3 pg (ref 26.0–34.0)
MCHC: 32.5 g/dL (ref 30.0–36.0)
MCV: 87.2 fL (ref 80.0–100.0)
Monocytes Absolute: 0.5 10*3/uL (ref 0.1–1.0)
Monocytes Relative: 9 %
Neutro Abs: 3.6 10*3/uL (ref 1.7–7.7)
Neutrophils Relative %: 69 %
Platelets: 255 10*3/uL (ref 150–400)
RBC: 3.99 MIL/uL — ABNORMAL LOW (ref 4.22–5.81)
RDW: 12.1 % (ref 11.5–15.5)
WBC: 5.2 10*3/uL (ref 4.0–10.5)
nRBC: 0 % (ref 0.0–0.2)

## 2023-01-29 LAB — COMPREHENSIVE METABOLIC PANEL
ALT: 33 U/L (ref 0–44)
AST: 29 U/L (ref 15–41)
Albumin: 3.9 g/dL (ref 3.5–5.0)
Alkaline Phosphatase: 111 U/L (ref 38–126)
Anion gap: 13 (ref 5–15)
BUN: 26 mg/dL — ABNORMAL HIGH (ref 8–23)
CO2: 26 mmol/L (ref 22–32)
Calcium: 8.7 mg/dL — ABNORMAL LOW (ref 8.9–10.3)
Chloride: 95 mmol/L — ABNORMAL LOW (ref 98–111)
Creatinine, Ser: 1.54 mg/dL — ABNORMAL HIGH (ref 0.61–1.24)
GFR, Estimated: 45 mL/min — ABNORMAL LOW (ref >60)
Glucose, Bld: 267 mg/dL — ABNORMAL HIGH (ref 70–99)
Potassium: 3.6 mmol/L (ref 3.5–5.1)
Sodium: 134 mmol/L — ABNORMAL LOW (ref 135–145)
Total Bilirubin: 1.2 mg/dL (ref 0.3–1.2)
Total Protein: 7.7 g/dL (ref 6.5–8.1)

## 2023-01-29 LAB — MAGNESIUM: Magnesium: 2.2 mg/dL (ref 1.7–2.4)

## 2023-01-29 LAB — CBG MONITORING, ED: Glucose-Capillary: 204 mg/dL — ABNORMAL HIGH (ref 70–99)

## 2023-01-29 LAB — TROPONIN I (HIGH SENSITIVITY): Troponin I (High Sensitivity): 5 ng/L (ref <18)

## 2023-01-29 NOTE — ED Triage Notes (Signed)
Pt bib daughters for complains of hypotension. Per daughters pt has been seen here several times in the past week. Tonight after dinner pt was walking toward daughter and appeared to be off balance. Daughter told pt to sit down and took blood sugar and blood pressure. Blood sugar was 190 and BP was 54/40 at home. Pt denies any pain, lightheadedness or dizziness at this time.

## 2023-01-29 NOTE — ED Notes (Signed)
Pt ambulatory to room from triage at this time.

## 2023-01-29 NOTE — ED Notes (Signed)
Orthostatic Vital Signs  Lying BP 87/53 PR 78 Sitting BP 103/57 PR 97 Standing 0 min BP 82/48 PR 103 Standing 3 min BP 90/54 PR 113  EDP [facility] informed of findings.

## 2023-01-30 ENCOUNTER — Encounter (HOSPITAL_COMMUNITY): Payer: Self-pay | Admitting: Emergency Medicine

## 2023-01-30 LAB — URINALYSIS, ROUTINE W REFLEX MICROSCOPIC
Bilirubin Urine: NEGATIVE
Glucose, UA: >=500 mg/dL — AB
Hgb urine dipstick: NEGATIVE
Ketones, ur: NEGATIVE mg/dL
Leukocytes,Ua: NEGATIVE
Nitrite: NEGATIVE
Protein, ur: NEGATIVE mg/dL
Specific Gravity, Urine: <1.005 — ABNORMAL LOW (ref 1.005–1.030)
pH: 6 (ref 5.0–8.0)

## 2023-01-30 LAB — TROPONIN I (HIGH SENSITIVITY): Troponin I (High Sensitivity): 4 ng/L (ref <18)

## 2023-01-30 LAB — URINALYSIS, MICROSCOPIC (REFLEX)

## 2023-01-30 MED ORDER — LACTATED RINGERS IV BOLUS
1000.0000 mL | Freq: Once | INTRAVENOUS | Status: AC
  Administered 2023-01-30: 1000 mL via INTRAVENOUS

## 2023-01-30 MED ORDER — ACETAMINOPHEN 325 MG PO TABS
650.0000 mg | ORAL_TABLET | Freq: Once | ORAL | Status: AC
  Administered 2023-01-30: 650 mg via ORAL
  Filled 2023-01-30: qty 2

## 2023-01-30 NOTE — ED Notes (Signed)
Discharge instructions provided by edp were discussed with pt. Pt verbalized understanding with no additional questions a this time. Pt wheelchair to car.going home with daughter at bedside.

## 2023-01-30 NOTE — ED Notes (Signed)
EDP at bedside  

## 2023-01-30 NOTE — ED Notes (Signed)
Pt ambulated in the hall independently without difficulty. Gait steady. Pt denies dizziness/lightheadness.

## 2023-01-30 NOTE — ED Provider Notes (Signed)
[place] EMERGENCY DEPARTMENT AT [facility] Provider Note   CSN: [id number] Arrival date & time: 01/29/23  2220     History  Chief Complaint  Patient presents with   Hypotension    Angel Dixon is a 82 y.o. male.  82 year old male presents to the ER with his daughter secondary to a near syncopal episode associated with low blood pressure.  Patient recently admitted to the hospital for similar episode thought to be possibly absence seizure the EEG, MRI and the rest of workup was reassuring.  He was sent home yesterday.  Tonight he was walking through the house and he got dizzy and felt like he is going to pass out.  One of his daughters checked his blood pressure reported as 54/40 and his blood glucose was 190.  Patient feels well now.  States has been eating drinking okay and urinate and defecate okay.  No other changes since leaving the hospital.        Home Medications Prior to Admission medications   Medication Sig Start Date End Date Taking? Authorizing Provider  acetaminophen (TYLENOL) 500 MG tablet Take 1 tablet (500 mg total) by mouth every 6 (six) hours as needed. 01/26/23   Fayrene Helper, PA-C  amiodarone (PACERONE) 200 MG tablet Take 1 tablet (200 mg total) by mouth daily. 11/06/22   Shon Hale, MD  aspirin EC 81 MG tablet Take 1 tablet (81 mg total) by mouth daily with breakfast. Swallow whole. 11/06/22   Shon Hale, MD  atorvastatin (LIPITOR) 40 MG tablet Take 1 tablet (40 mg total) by mouth daily. 11/06/22   Shon Hale, MD  benzonatate (TESSALON) 100 MG capsule Take 1 capsule (100 mg total) by mouth every 8 (eight) hours. 01/26/23   Fayrene Helper, PA-C  cetirizine (ZYRTEC) 10 MG tablet Take 10 mg by mouth daily as needed for allergies.    [name], MD  dapagliflozin propanediol (FARXIGA) 5 MG TABS tablet Take 1 tablet (5 mg total) by mouth daily. 11/06/22   Shon Hale, MD  finasteride (PROSCAR) 5 MG tablet Take 1 tablet (5 mg total)  by mouth daily. 01/28/23   Sherryll Burger, Pratik D, DO  fluticasone (FLONASE) 50 MCG/ACT nasal spray Place 1 spray into both nostrils as needed for allergies or rhinitis.  09/20/14   [provider]  glipiZIDE (GLUCOTROL XL) 5 MG 24 hr tablet Take 10 mg by mouth daily. 12/16/22   [provider]  glipiZIDE (GLUCOTROL) 5 MG tablet Take 1 tablet (5 mg total) by mouth 2 (two) times daily with a meal. 11/06/22   Emokpae, Courage, MD  meclizine (ANTIVERT) 25 MG tablet Take 25 mg by mouth 3 (three) times daily as needed for dizziness. 11/27/22   [provider]  metoprolol tartrate (LOPRESSOR) 25 MG tablet Take 1 tablet (25 mg total) by mouth 2 (two) times daily. Patient taking differently: Take 25 mg by mouth daily. 11/06/22   Shon Hale, MD  mirabegron ER (MYRBETRIQ) 25 MG TB24 tablet Take 1 tablet (25 mg total) by mouth daily. 03/07/22   McKenzie, Mardene Celeste, MD  nitroGLYCERIN (NITROSTAT) 0.4 MG SL tablet Place 1 tablet (0.4 mg total) under the tongue every 5 (five) minutes as needed for chest pain (severe chest pain or pressure only). 08/12/20 05/19/29  Hilty, Lisette Abu, MD  NOVOLIN N 100 UNIT/ML injection Inject 10 Units into the skin 2 (two) times daily as needed (If Blood Sugar gets above 100). 10/17/22   [provider]  Letta Pate  ULTRA test strip USE 1 STRIP TO CHECK BLOOD GLUCOSE LEVEL TWICE DAILY. 11/22/20   [provider]  pantoprazole (PROTONIX) 40 MG tablet Take 1 tablet (40 mg total) by mouth 2 (two) times daily. 11/06/22   Shon Hale, MD  Polyethylene Glycol 400 (BLINK TEARS OP) Place 1 drop into both eyes 2 (two) times daily.    [provider]  silodosin (RAPAFLO) 8 MG CAPS capsule Take 1 capsule (8 mg total) by mouth at bedtime. 03/07/22   McKenzie, Mardene Celeste, MD      Allergies    Lisinopril, Sulfa antibiotics, Sulfacetamide, Vioxx [rofecoxib], and Yellow jacket venom [bee venom]    Review of Systems   Review of Systems  Physical  Exam Updated Vital Signs BP 115/68   Pulse 64   Temp 98.4 F (36.9 C) (Oral)   Resp 16   Wt 65.8 kg   SpO2 99%   BMI 22.06 kg/m  Physical Exam Vitals and nursing note reviewed.  Constitutional:      Appearance: He is well-developed.  HENT:     Head: Normocephalic and atraumatic.     Mouth/Throat:     Mouth: Mucous membranes are moist.  Cardiovascular:     Rate and Rhythm: Normal rate.  Pulmonary:     Effort: Pulmonary effort is normal. No respiratory distress.  Abdominal:     General: There is no distension.  Musculoskeletal:        General: Normal range of motion.     Cervical back: Normal range of motion.  Skin:    General: Skin is warm and dry.  Neurological:     General: No focal deficit present.     Mental Status: He is alert.     ED Results / Procedures / Treatments   Labs (all labs ordered are listed, but only abnormal results are displayed) Labs Reviewed  CBC WITH DIFFERENTIAL/PLATELET - Abnormal; Notable for the following components:      Result Value   RBC 3.99 (*)    Hemoglobin 11.3 (*)    HCT 34.8 (*)    All other components within normal limits  COMPREHENSIVE METABOLIC PANEL - Abnormal; Notable for the following components:   Sodium 134 (*)    Chloride 95 (*)    Glucose, Bld 267 (*)    BUN 26 (*)    Creatinine, Ser 1.54 (*)    Calcium 8.7 (*)    GFR, Estimated 45 (*)    All other components within normal limits  URINALYSIS, ROUTINE W REFLEX MICROSCOPIC - Abnormal; Notable for the following components:   Specific Gravity, Urine <1.005 (*)    Glucose, UA >=500 (*)    All other components within normal limits  URINALYSIS, MICROSCOPIC (REFLEX) - Abnormal; Notable for the following components:   Bacteria, UA RARE (*)    All other components within normal limits  CBG MONITORING, ED - Abnormal; Notable for the following components:   Glucose-Capillary 204 (*)    All other components within normal limits  MAGNESIUM  TROPONIN I (HIGH SENSITIVITY)   TROPONIN I (HIGH SENSITIVITY)    EKG None  Radiology DG Chest 2 View  Result Date: 01/29/2023 CLINICAL DATA:  Hypotension and balance difficulties EXAM: CHEST - 2 VIEW COMPARISON:  01/26/2023 FINDINGS: The heart size and mediastinal contours are within normal limits. Both lungs are clear. The visualized skeletal structures are unremarkable. IMPRESSION: No active cardiopulmonary disease. Electronically Signed   By: Alcide Clever M.D.   On: 01/29/2023 23:49  EEG adult  Result Date: 01/28/2023 Charlsie Quest, MD     01/28/2023  6:40 PM Patient Name: RAAHIL BUB MRN: [id number] Epilepsy Attending: Charlsie Quest Referring Physician/Provider: Elease Etienne, MD Date: 01/28/2023 Duration: 23.45 mins Patient history: 82yo M with syncope getting eeg to evaluate for seizure. Level of alertness: Awake, asleep AEDs during EEG study: None Technical aspects: This EEG study was done with scalp electrodes positioned according to the 10-20 International system of electrode placement. Electrical activity was reviewed with band pass filter of 1-70Hz , sensitivity of 7 uV/mm, display speed of 2mm/sec with a 60Hz  notched filter applied as appropriate. EEG data were recorded continuously and digitally stored.  Video monitoring was available and reviewed as appropriate. Description: The posterior dominant rhythm consists of 8-9 Hz activity of moderate voltage (25-35 uV) seen predominantly in posterior head regions, symmetric and reactive to eye opening and eye closing. Sleep was characterized by vertex waves, sleep spindles (12 to 14 Hz), maximal frontocentral region. Hyperventilation and photic stimulation were not performed.   IMPRESSION: This study is within normal limits. No seizures or epileptiform discharges were seen throughout the recording. A normal interictal EEG does not exclude the diagnosis of epilepsy. Charlsie Quest   MR BRAIN WO CONTRAST  Result Date: 01/28/2023 CLINICAL DATA:  Provided  history: Neuro deficit, acute, stroke suspected. EXAM: MRI HEAD WITHOUT CONTRAST MRA HEAD WITHOUT CONTRAST TECHNIQUE: Multiplanar, multi-echo pulse sequences of the brain and surrounding structures were acquired without intravenous contrast. Angiographic images of the Circle of Willis were acquired using MRA technique without intravenous contrast. COMPARISON:  Head CT 01/26/2023. FINDINGS: MRI HEAD FINDINGS Brain: No age advanced or lobar predominant parenchymal atrophy. Multifocal T2 FLAIR hyperintense signal abnormality within the cerebral white matter, nonspecific but compatible with moderate for age chronic small vessel ischemic disease. There are a few punctate chronic microhemorrhages scattered within the supratentorial brain. There is no acute infarct. No evidence of an intracranial mass. No extra-axial fluid collection. No midline shift. Vascular: Maintained flow voids within the proximal large arterial vessels. Skull and upper cervical spine: No focal suspicious marrow lesion. Mild C3-C4 grade 1 anterolisthesis. Sinuses/Orbits: No mass or acute finding within the imaged orbits. Prior but ocular lens replacement. Moderate mucosal thickening within the bilateral maxillary sinuses. Mild mucosal thickening within the bilateral ethmoid and left frontal sinuses. MRA HEAD FINDINGS Anterior circulation: The intracranial internal carotid arteries are patent. The M1 middle cerebral arteries are patent. No M2 proximal branch occlusion or high-grade proximal stenosis. The anterior cerebral arteries are patent. 1-2 mm inferiorly projecting vascular true shin arising from the supraclinoid right ICA, which may reflect an aneurysm or infundibulum (series 11, image 94). 2 mm inferiorly projecting vascular protrusion arising from the supraclinoid left ICA, which may reflect an aneurysm or infundibulum (series 11, image 93). Posterior circulation: The intracranial vertebral arteries are patent. The basilar artery is patent.  The posterior cerebral arteries are patent. Moderate stenosis within the right PCA at the P2/P3 junction. Posterior communicating arteries are diminutive or absent, bilaterally. Anatomic variants: As described. IMPRESSION: MRI brain: 1. No evidence of an acute intracranial abnormality. 2. Moderate severity chronic small vessel ischemic changes within the cerebral white matter. 3. Paranasal sinus disease as described. MRA head: 1. No intracranial large vessel occlusion. 2. 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 Electronically Signed   By: Jackey Loge D.O.   On: 01/28/2023 10:53   MR ANGIO HEAD WO CONTRAST  Result Date:  01/28/2023 CLINICAL DATA:  Provided history: Neuro deficit, acute, stroke suspected. EXAM: MRI HEAD WITHOUT CONTRAST MRA HEAD WITHOUT CONTRAST TECHNIQUE: Multiplanar, multi-echo pulse sequences of the brain and surrounding structures were acquired without intravenous contrast. Angiographic images of the Circle of Willis were acquired using MRA technique without intravenous contrast. COMPARISON:  Head CT 01/26/2023. FINDINGS: MRI HEAD FINDINGS Brain: No age advanced or lobar predominant parenchymal atrophy. Multifocal T2 FLAIR hyperintense signal abnormality within the cerebral white matter, nonspecific but compatible with moderate for age chronic small vessel ischemic disease. There are a few punctate chronic microhemorrhages scattered within the supratentorial brain. There is no acute infarct. No evidence of an intracranial mass. No extra-axial fluid collection. No midline shift. Vascular: Maintained flow voids within the proximal large arterial vessels. Skull and upper cervical spine: No focal suspicious marrow lesion. Mild C3-C4 grade 1 anterolisthesis. Sinuses/Orbits: No mass or acute finding within the imaged orbits. Prior but ocular lens replacement. Moderate mucosal thickening within the bilateral maxillary sinuses. Mild mucosal thickening within  the bilateral ethmoid and left frontal sinuses. MRA HEAD FINDINGS Anterior circulation: The intracranial internal carotid arteries are patent. The M1 middle cerebral arteries are patent. No M2 proximal branch occlusion or high-grade proximal stenosis. The anterior cerebral arteries are patent. 1-2 mm inferiorly projecting vascular true shin arising from the supraclinoid right ICA, which may reflect an aneurysm or infundibulum (series 11, image 94). 2 mm inferiorly projecting vascular protrusion arising from the supraclinoid left ICA, which may reflect an aneurysm or infundibulum (series 11, image 93). Posterior circulation: The intracranial vertebral arteries are patent. The basilar artery is patent. The posterior cerebral arteries are patent. Moderate stenosis within the right PCA at the P2/P3 junction. Posterior communicating arteries are diminutive or absent, bilaterally. Anatomic variants: As described. IMPRESSION: MRI brain: 1. No evidence of an acute intracranial abnormality. 2. Moderate severity chronic small vessel ischemic changes within the cerebral white matter. 3. Paranasal sinus disease as described. MRA head: 1. No intracranial large vessel occlusion. 2. 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 Electronically Signed   By: Jackey Loge D.O.   On: 01/28/2023 10:53    Procedures Procedures    Medications Ordered in ED Medications  lactated ringers bolus 1,000 mL (0 mLs Intravenous Stopped 01/30/23 0347)  acetaminophen (TYLENOL) tablet 650 mg (650 mg Oral Given 01/30/23 1610)    ED Course/ Medical Decision Making/ A&P                                 Medical Decision Making Amount and/or Complexity of Data Reviewed Labs: ordered. Radiology: ordered. ECG/medicine tests: ordered.  Risk OTC drugs.   Patient just left the hospital after significant workup for seizure versus syncope with negative EEG, MRI and multiple other negative test.  Did  not feel the need to redo those as tonight situation was similar to what he had been here previously for.  I suspect he is having syncope/near syncope.  Could be orthostatic.  He he does have a little bit of a increase in his creatinine by about 50% since leaving the hospital.  Suspect there is some element of dehydration, venous insufficiency and he is on Imdur which is probably exacerbated problems well.  He is not bradycardic so I think metoprolol is related.  His heart rate seems to respond appropriately on orthostatics.  Was given fluids was able to ambulate without difficulty.  Blood  pressures have been stable for multiple hours.  Heart rates been stable multiple hours.  Patient feels much better.  Will discontinue his Imdur and ask him to follow-up with his cardiologist for further management of that.  Will follow-up with his PCP within 7 to 10 days for recheck of his creatinine. No indication for further imaging, workup or admission at this time.   Final Clinical Impression(s) / ED Diagnoses Final diagnoses:  Near syncope  Elevated serum creatinine    Rx / DC Orders ED Discharge Orders     None         Giancarlo Askren, Barbara Cower, MD 01/30/23 [phone]

## 2023-01-31 ENCOUNTER — Emergency Department (HOSPITAL_COMMUNITY)
Admission: EM | Admit: 2023-01-31 | Discharge: 2023-01-31 | Disposition: A | Discharge status: Home / Self Care | Payer: Medicare HMO | Attending: Emergency Medicine | Admitting: Emergency Medicine

## 2023-01-31 ENCOUNTER — Other Ambulatory Visit: Payer: Self-pay

## 2023-01-31 ENCOUNTER — Encounter (HOSPITAL_COMMUNITY): Payer: Self-pay

## 2023-01-31 DIAGNOSIS — Z7982 Long term (current) use of aspirin: Secondary | ICD-10-CM | POA: Insufficient documentation

## 2023-01-31 DIAGNOSIS — Z79899 Other long term (current) drug therapy: Secondary | ICD-10-CM | POA: Insufficient documentation

## 2023-01-31 DIAGNOSIS — R7881 Bacteremia: Secondary | ICD-10-CM | POA: Insufficient documentation

## 2023-01-31 DIAGNOSIS — U071 COVID-19: Secondary | ICD-10-CM

## 2023-01-31 DIAGNOSIS — R030 Elevated blood-pressure reading, without diagnosis of hypertension: Secondary | ICD-10-CM

## 2023-01-31 DIAGNOSIS — I10 Essential (primary) hypertension: Secondary | ICD-10-CM

## 2023-01-31 DIAGNOSIS — Z794 Long term (current) use of insulin: Secondary | ICD-10-CM | POA: Diagnosis not present

## 2023-01-31 DIAGNOSIS — R799 Abnormal finding of blood chemistry, unspecified: Secondary | ICD-10-CM

## 2023-01-31 DIAGNOSIS — R059 Cough, unspecified: Secondary | ICD-10-CM | POA: Diagnosis present

## 2023-01-31 LAB — CBG MONITORING, ED: Glucose-Capillary: 152 mg/dL — ABNORMAL HIGH (ref 70–99)

## 2023-01-31 NOTE — ED Triage Notes (Signed)
Pt was contacted from [facility] and told to come in to the ED because he has abnormal lab values. VS WNL in ED.

## 2023-01-31 NOTE — ED Provider Notes (Signed)
[facility] AT [facility] Provider Note   CSN: [id number] Arrival date & time: 01/31/23  0930     History  Chief Complaint  Patient presents with   abnormal labs    Angel Dixon is a 82 y.o. male.  Pt with c/o being told to return to hospital due to abnormal lab.  Pt indicates feels fine. Indicates recent diagnosis covid, but feels better from that perspective. Mild cough is improving. No sob. No chest pain. No abd pain or nvd. No faintness/weakness. No fever, chills, or sweats.   The history is provided by the patient, a relative and medical records.       Home Medications Prior to Admission medications   Medication Sig Start Date End Date Taking? Authorizing Provider  acetaminophen (TYLENOL) 500 MG tablet Take 1 tablet (500 mg total) by mouth every 6 (six) hours as needed. 01/26/23   Fayrene Helper, PA-C  amiodarone (PACERONE) 200 MG tablet Take 1 tablet (200 mg total) by mouth daily. 11/06/22   Shon Hale, MD  aspirin EC 81 MG tablet Take 1 tablet (81 mg total) by mouth daily with breakfast. Swallow whole. 11/06/22   Shon Hale, MD  atorvastatin (LIPITOR) 40 MG tablet Take 1 tablet (40 mg total) by mouth daily. 11/06/22   Shon Hale, MD  benzonatate (TESSALON) 100 MG capsule Take 1 capsule (100 mg total) by mouth every 8 (eight) hours. 01/26/23   Fayrene Helper, PA-C  cetirizine (ZYRTEC) 10 MG tablet Take 10 mg by mouth daily as needed for allergies.    [name], MD  dapagliflozin propanediol (FARXIGA) 5 MG TABS tablet Take 1 tablet (5 mg total) by mouth daily. 11/06/22   Shon Hale, MD  finasteride (PROSCAR) 5 MG tablet Take 1 tablet (5 mg total) by mouth daily. 01/28/23   Sherryll Burger, Pratik D, DO  fluticasone (FLONASE) 50 MCG/ACT nasal spray Place 1 spray into both nostrils as needed for allergies or rhinitis.  09/20/14   [provider]  glipiZIDE (GLUCOTROL XL) 5 MG 24 hr tablet Take 10 mg by mouth daily. 12/16/22    [provider]  glipiZIDE (GLUCOTROL) 5 MG tablet Take 1 tablet (5 mg total) by mouth 2 (two) times daily with a meal. 11/06/22   Emokpae, Courage, MD  meclizine (ANTIVERT) 25 MG tablet Take 25 mg by mouth 3 (three) times daily as needed for dizziness. 11/27/22   [provider]  metoprolol tartrate (LOPRESSOR) 25 MG tablet Take 1 tablet (25 mg total) by mouth 2 (two) times daily. Patient taking differently: Take 25 mg by mouth daily. 11/06/22   Shon Hale, MD  mirabegron ER (MYRBETRIQ) 25 MG TB24 tablet Take 1 tablet (25 mg total) by mouth daily. 03/07/22   McKenzie, Mardene Celeste, MD  nitroGLYCERIN (NITROSTAT) 0.4 MG SL tablet Place 1 tablet (0.4 mg total) under the tongue every 5 (five) minutes as needed for chest pain (severe chest pain or pressure only). 08/12/20 05/19/29  Hilty, Lisette Abu, MD  NOVOLIN N 100 UNIT/ML injection Inject 10 Units into the skin 2 (two) times daily as needed (If Blood Sugar gets above 100). 10/17/22   [provider]  ONETOUCH ULTRA test strip USE 1 STRIP TO CHECK BLOOD GLUCOSE LEVEL TWICE DAILY. 11/22/20   [provider]  pantoprazole (PROTONIX) 40 MG tablet Take 1 tablet (40 mg total) by mouth 2 (two) times daily. 11/06/22   Shon Hale, MD  Polyethylene Glycol 400 (BLINK TEARS OP) Place 1 drop  into both eyes 2 (two) times daily.    [provider]  silodosin (RAPAFLO) 8 MG CAPS capsule Take 1 capsule (8 mg total) by mouth at bedtime. 03/07/22   McKenzie, Mardene Celeste, MD      Allergies    Lisinopril, Sulfa antibiotics, Sulfacetamide, Vioxx [rofecoxib], and Yellow jacket venom [bee venom]    Review of Systems   Review of Systems  Constitutional:  Negative for chills, diaphoresis and fever.  HENT:  Negative for sore throat.   Eyes:  Negative for redness.  Respiratory:  Negative for shortness of breath.   Cardiovascular:  Negative for chest pain.  Gastrointestinal:  Negative for abdominal pain and vomiting.   Genitourinary:  Negative for dysuria.  Musculoskeletal:  Negative for neck pain and neck stiffness.  Skin:  Negative for rash.  Neurological:  Negative for light-headedness and headaches.  Psychiatric/Behavioral:  Negative for confusion.     Physical Exam Updated Vital Signs BP (!) 165/93 (BP Location: Left Arm)   Pulse 74   Temp 98.1 F (36.7 C) (Oral)   Resp 20   Ht 1.727 m (5\' 8" )   Wt 65.8 kg   SpO2 95%   BMI 22.06 kg/m  Physical Exam Vitals and nursing note reviewed.  Constitutional:      Appearance: Normal appearance. He is well-developed.  HENT:     Head: Atraumatic.     Nose: Nose normal.     Mouth/Throat:     Mouth: Mucous membranes are moist.     Pharynx: Oropharynx is clear.  Eyes:     General: No scleral icterus.    Conjunctiva/sclera: Conjunctivae normal.  Neck:     Trachea: No tracheal deviation.     Comments: Normal rom. No stiffness or rigidity.  Cardiovascular:     Rate and Rhythm: Normal rate and regular rhythm.     Pulses: Normal pulses.     Heart sounds: Normal heart sounds. No murmur heard.    No friction rub. No gallop.  Pulmonary:     Effort: Pulmonary effort is normal. No accessory muscle usage or respiratory distress.     Breath sounds: Normal breath sounds.  Abdominal:     General: Bowel sounds are normal. There is no distension.     Palpations: Abdomen is soft. There is no mass.     Tenderness: There is no abdominal tenderness.  Genitourinary:    Comments: No cva tenderness. Rectal w light brown stool, heme negative.  Musculoskeletal:        General: No swelling.     Cervical back: Normal range of motion and neck supple. No rigidity.     Right lower leg: No edema.     Left lower leg: No edema.  Skin:    General: Skin is warm and dry.     Findings: No rash.  Neurological:     Mental Status: He is alert.     Comments: Alert, speech clear. Steady gait.   Psychiatric:        Mood and Affect: Mood normal.     ED Results /  Procedures / Treatments   Labs (all labs ordered are listed, but only abnormal results are displayed) Labs Reviewed  CBG MONITORING, ED - Abnormal; Notable for the following components:      Result Value   Glucose-Capillary 152 (*)    All other components within normal limits    EKG EKG Interpretation Date/Time:  Thursday January 31 2023 10:01:01 EDT Ventricular Rate:  75 PR  Interval:  174 QRS Duration:  83 QT Interval:  409 QTC Calculation: 457 R Axis:   42  Text Interpretation: Sinus rhythm Atrial premature complex Confirmed by Cathren Laine ([id number]) on 01/31/2023 10:17:53 AM  Radiology DG Chest 2 View  Result Date: 01/29/2023 CLINICAL DATA:  Hypotension and balance difficulties EXAM: CHEST - 2 VIEW COMPARISON:  01/26/2023 FINDINGS: The heart size and mediastinal contours are within normal limits. Both lungs are clear. The visualized skeletal structures are unremarkable. IMPRESSION: No active cardiopulmonary disease. Electronically Signed   By: Alcide Clever M.D.   On: 01/29/2023 23:49    Procedures Procedures    Medications Ordered in ED Medications - No data to display  ED Course/ Medical Decision Making/ A&P                                 Medical Decision Making Problems Addressed: Contamination of blood culture: acute illness or injury COVID-19 virus infection: acute illness or injury with systemic symptoms Elevated blood pressure reading: acute illness or injury Essential hypertension: chronic illness or injury with exacerbation, progression, or side effects of treatment that poses a threat to life or bodily functions  Amount and/or Complexity of Data Reviewed Independent Historian:     Details: Family, hx External Data Reviewed: labs and notes. Labs: ordered. Decision-making details documented in ED Course.  Risk Decision regarding hospitalization.   Iv ns. Continuous pulse ox and cardiac monitoring. Labs ordered/sent.   Differential diagnosis includes  lab abn, covid, positive cultures, etc. Dispo decision including potential need for admission considered - will get labs and imaging and reassess.   Reviewed nursing notes and prior charts for additional history. External reports reviewed. Additional history from: family member   Cardiac monitor: sinus rhythm, rate 70.  Pt indicates he feels fine, no new symptoms. No fever/chills/sweats. On review labs, pt did have culture result positive in anaerobic bottle for gram pos rod - this is generally a contaminant, and as pt feels improved, no fever/chills/sweats, or other new or worsening symptoms (pt says feels better/fine), result does not appear to be clinically relevant.   As an aside, pt indicates dark stools in past 6 months. On exam, stool is normal color, light brown, heme neg. Recent cbc/hgb stable and c/w baseline.   Labs reviewed/interpreted by me - glucose 152. Stool is heme negative and normal color.   Pt currently appears stable for d/c.   Rec close pcp f/u.  Return precautions provided.          Final Clinical Impression(s) / ED Diagnoses Final diagnoses:  None    Rx / DC Orders ED Discharge Orders     None         Cathren Laine, MD 01/31/23 1045

## 2023-01-31 NOTE — Discharge Instructions (Addendum)
It was our pleasure to provide your ER care today - we hope that you feel better.  It appears your recent positive blood culture result was a contaminant.   Your stool tests negative for blood, and your recent blood counts have been stable.  Follow up with your GI doctor as planned for colonoscopy.   For recent symptoms, also follow up with primary care doctor in the next 1-2 weeks. Also have your blood pressure rechecked then as it is high  today.   Return to ER if worse, new symptoms, fevers/chills/sweats, trouble breathing, fainting, or other concern.

## 2023-01-31 NOTE — ED Notes (Signed)
Pt contacted 01/31/2023 at 0645 regarding blood culture result. Pt needs repeat blood cultures done per Dr. Wilkie Aye

## 2023-02-03 LAB — CULTURE, BLOOD (ROUTINE X 2)
Special Requests: ADEQUATE
Special Requests: ADEQUATE

## 2023-02-07 ENCOUNTER — Ambulatory Visit (INDEPENDENT_AMBULATORY_CARE_PROVIDER_SITE_OTHER): Payer: Medicare HMO | Admitting: Gastroenterology

## 2023-03-10 ENCOUNTER — Other Ambulatory Visit: Payer: Self-pay | Admitting: Urology

## 2023-03-13 ENCOUNTER — Ambulatory Visit: Payer: Medicare HMO | Admitting: Urology

## 2023-03-13 VITALS — BP 116/72 | HR 84

## 2023-03-13 DIAGNOSIS — N401 Enlarged prostate with lower urinary tract symptoms: Secondary | ICD-10-CM

## 2023-03-13 DIAGNOSIS — R350 Frequency of micturition: Secondary | ICD-10-CM

## 2023-03-13 DIAGNOSIS — N138 Other obstructive and reflux uropathy: Secondary | ICD-10-CM | POA: Diagnosis not present

## 2023-03-13 DIAGNOSIS — R351 Nocturia: Secondary | ICD-10-CM | POA: Diagnosis not present

## 2023-03-13 LAB — URINALYSIS, ROUTINE W REFLEX MICROSCOPIC
Bilirubin, UA: NEGATIVE
Glucose, UA: NEGATIVE
Ketones, UA: NEGATIVE
Leukocytes,UA: NEGATIVE
Nitrite, UA: NEGATIVE
Protein,UA: NEGATIVE
RBC, UA: NEGATIVE
Specific Gravity, UA: 1.01 (ref 1.005–1.030)
Urobilinogen, Ur: 0.2 mg/dL (ref 0.2–1.0)
pH, UA: 6 (ref 5.0–7.5)

## 2023-03-13 MED ORDER — FINASTERIDE 5 MG PO TABS: 5.0000 mg | ORAL_TABLET | Freq: Every day | ORAL | 3 refills | Status: DC

## 2023-03-13 MED ORDER — SILODOSIN 8 MG PO CAPS: 8.0000 mg | ORAL_CAPSULE | Freq: Every day | ORAL | 3 refills | Status: DC

## 2023-03-13 MED ORDER — MIRABEGRON ER 25 MG PO TB24: 25.0000 mg | ORAL_TABLET | Freq: Every day | ORAL | 11 refills | Status: DC

## 2023-03-13 NOTE — Progress Notes (Signed)
03/13/2023 10:29 AM   Angel Dixon 01/15/1941 [id number]  Referring provider: Gareth Morgan, MD [address] 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 He remains on rapaflo 8mg  daily and finasteride 5mg  daily. IPSS 5 QOl 1. Nocturia 1-2x depending on fluid consumption. Urine stream strong. No urinary frequency or urgency. UA normal. No other complaints today   PMH: Past Medical History:  Diagnosis Date   Angina    CAD (coronary artery disease)    moderate, by cath   Diabetes mellitus    DJD (degenerative joint disease)    lumbar lam 4/05   Dyslipidemia    Dysrhythmia    "palpitation", PVCs   GERD (gastroesophageal reflux disease)    Hypertension    PUD (peptic ulcer disease)    endo 03/15/10    Surgical History: Past Surgical History:  Procedure Laterality Date   BACK SURGERY  2005   CARDIAC CATHETERIZATION  02/22/2006   mild obs. CAD 60-70% narrowing in small inferior branch of 1st diagonal of LAD, smooth 10% narrowing of mid RCA (Dr. 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 Jonette Eva)   CARDIAC CATHETERIZATION  05/11/2011   nonobstructive CAD (Dr. Ranae Palms)   COLONOSCOPY N/A 06/13/2016   Procedure: COLONOSCOPY;  Surgeon: Malissa Hippo, MD;  Location: [facility];  Service: Endoscopy;  Laterality: N/A;  1200   HERNIA REPAIR     INGUINAL HERNIA REPAIR  1982   right   LEFT HEART CATHETERIZATION WITH CORONARY ANGIOGRAM N/A 05/14/2011   Procedure: LEFT HEART CATHETERIZATION WITH CORONARY ANGIOGRAM;  Surgeon: Marykay Lex, MD;  Location: [facility] CATH LAB;  Service: Cardiovascular;  Laterality: N/A;  Coronary angiogram and possible PCI   LUMBAR LAMINECTOMY  09/2003   L2-3, 3-4, 4-5   NM MYOCAR PERF WALL MOTION  10/03/2009   bruce myoview - normal perfusion in all regions, EF 70%,  no ischemia, low risk   TRANSTHORACIC ECHOCARDIOGRAM  03/28/2012   EF 65-70%, grade 1 diastolic dysfunction, mod AV calcification, mild Angel   XI ROBOTIC ASSISTED INGUINAL HERNIA REPAIR WITH MESH Left 12/20/2022   Procedure: XI ROBOTIC ASSISTED INGUINAL HERNIA REPAIR WITH MESH;  Surgeon: Franky Macho, MD;  Location: [facility] ORS;  Service: General;  Laterality: Left;    Home Medications:  Allergies as of 03/13/2023       Reactions   Lisinopril Other (See Comments)   Sore throat, no obvious angioedema or cough   Sulfa Antibiotics Hives   Sulfacetamide Hives   Vioxx [rofecoxib] Hives   Yellow Jacket Venom [bee Venom] Swelling        Medication List        Accurate as of March 13, 2023 10:29 AM. If you have any questions, ask your nurse or doctor.          acetaminophen 500 MG tablet Commonly known as: TYLENOL Take 1 tablet (500 mg total) by mouth every 6 (six) hours as needed.   amiodarone 200 MG tablet Commonly known as: PACERONE Take 1 tablet (200 mg total) by mouth daily.   aspirin EC 81 MG tablet Take 1 tablet (81 mg total) by mouth daily with breakfast. Swallow whole.   atorvastatin 40 MG tablet Commonly known as: LIPITOR Take 1 tablet (40 mg total) by mouth daily.   benzonatate 100  MG capsule Commonly known as: TESSALON Take 1 capsule (100 mg total) by mouth every 8 (eight) hours.   BLINK TEARS OP Place 1 drop into both eyes 2 (two) times daily.   cetirizine 10 MG tablet Commonly known as: ZYRTEC Take 10 mg by mouth daily as needed for allergies.   dapagliflozin propanediol 5 MG Tabs tablet Commonly known as: Farxiga Take 1 tablet (5 mg total) by mouth daily.   finasteride 5 MG tablet Commonly known as: PROSCAR Take 1 tablet (5 mg total) by mouth daily.   fluticasone 50 MCG/ACT nasal spray Commonly known as: FLONASE Place 1 spray into both nostrils as needed for allergies or rhinitis.   glipiZIDE 5 MG tablet Commonly known as: GLUCOTROL Take 1 tablet  (5 mg total) by mouth 2 (two) times daily with a meal.   glipiZIDE 5 MG 24 hr tablet Commonly known as: GLUCOTROL XL Take 10 mg by mouth daily.   meclizine 25 MG tablet Commonly known as: ANTIVERT Take 25 mg by mouth 3 (three) times daily as needed for dizziness.   metoprolol tartrate 25 MG tablet Commonly known as: LOPRESSOR Take 1 tablet (25 mg total) by mouth 2 (two) times daily. What changed: when to take this   Myrbetriq 25 MG Tb24 tablet Generic drug: mirabegron ER TAKE 1 TABLET (25 MG TOTAL) BY MOUTH DAILY.   nitroGLYCERIN 0.4 MG SL tablet Commonly known as: NITROSTAT Place 1 tablet (0.4 mg total) under the tongue every 5 (five) minutes as needed for chest pain (severe chest pain or pressure only).   NovoLIN N 100 UNIT/ML injection Generic drug: insulin NPH Human Inject 10 Units into the skin 2 (two) times daily as needed (If Blood Sugar gets above 100).   OneTouch Ultra test strip Generic drug: glucose blood USE 1 STRIP TO CHECK BLOOD GLUCOSE LEVEL TWICE DAILY.   pantoprazole 40 MG tablet Commonly known as: PROTONIX Take 1 tablet (40 mg total) by mouth 2 (two) times daily.   silodosin 8 MG Caps capsule Commonly known as: RAPAFLO TAKE 1 CAPSULE (8 MG TOTAL) BY MOUTH AT BEDTIME.        Allergies:  Allergies  Allergen Reactions   Lisinopril Other (See Comments)    Sore throat, no obvious angioedema or cough   Sulfa Antibiotics Hives   Sulfacetamide Hives   Vioxx [Rofecoxib] Hives   Yellow Jacket Venom [Bee Venom] Swelling    Family History: Family History  Problem Relation Age of Onset   Kidney failure Mother        died 56   Coronary artery disease Father        died 67   Heart attack Sister        died of MI in her 61s   Sarcoidosis Daughter        died 56    Social History:  reports that he has quit smoking. His smoking use included cigarettes. He has a 25 pack-year smoking history. He has been exposed to tobacco smoke. He has never used  smokeless tobacco. He reports current alcohol use. He reports that he does not use drugs.  ROS: All other review of systems were reviewed and are negative except what is noted above in HPI  Physical Exam: BP 116/72   Pulse 84   Constitutional:  Alert and oriented, No acute distress. HEENT: [place] AT, moist mucus membranes.  Trachea midline, no masses. Cardiovascular: No clubbing, cyanosis, or edema. Respiratory: Normal respiratory effort, no increased work of breathing.  GI: Abdomen is soft, nontender, nondistended, no abdominal masses GU: No CVA tenderness.  Lymph: No cervical or inguinal lymphadenopathy. Skin: No rashes, bruises or suspicious lesions. Neurologic: Grossly intact, no focal deficits, moving all 4 extremities. Psychiatric: Normal mood and affect.  Laboratory Data: Lab Results  Component Value Date   WBC 5.2 01/29/2023   HGB 11.3 (L) 01/29/2023   HCT 34.8 (L) 01/29/2023   MCV 87.2 01/29/2023   PLT 255 01/29/2023    Lab Results  Component Value Date   CREATININE 1.54 (H) 01/29/2023    No results found for: "PSA"  No results found for: "TESTOSTERONE"  Lab Results  Component Value Date   HGBA1C 8.9 (H) 01/27/2023    Urinalysis    Component Value Date/Time   COLORURINE YELLOW 01/30/2023 0048   APPEARANCEUR CLEAR 01/30/2023 0048   APPEARANCEUR Clear 03/07/2022 0927   LABSPEC <1.005 (L) 01/30/2023 0048   PHURINE 6.0 01/30/2023 0048   GLUCOSEU >=500 (A) 01/30/2023 0048   HGBUR NEGATIVE 01/30/2023 0048   BILIRUBINUR NEGATIVE 01/30/2023 0048   BILIRUBINUR Negative 03/07/2022 0927   KETONESUR NEGATIVE 01/30/2023 0048   PROTEINUR NEGATIVE 01/30/2023 0048   UROBILINOGEN 0.2 12/20/2014 1314   NITRITE NEGATIVE 01/30/2023 0048   LEUKOCYTESUR NEGATIVE 01/30/2023 0048    Lab Results  Component Value Date   LABMICR Comment 03/07/2022   BACTERIA RARE (A) 01/30/2023    Pertinent Imaging:  Results for orders placed during the hospital encounter of  09/04/03  DG Abd 1 View  Narrative Clinical Data:    Postop from lumbar spine surgery.  Abdominal distention and pain. ABDOMEN, ONE VIEW Diffuse gaseous distention of colon is seen as well as small bowel loops and with gas noted to the level of the rectosigmoid region.  This is consistent with a postoperative ileus.  Midline abdominal skin staples are seen. IMPRESSION Ileus pattern.  Provider: Hillary Bow  No results found for this or any previous visit.  No results found for this or any previous visit.  No results found for this or any previous visit.  Results for orders placed during the hospital encounter of 06/19/16  US Renal  Narrative CLINICAL DATA:  BILATERAL flank pain, history hypertension, diabetes mellitus, GERD, coronary artery disease  EXAM: RENAL / URINARY TRACT ULTRASOUND COMPLETE  COMPARISON:  CT abdomen and pelvis 02/20/2007  FINDINGS: Right Kidney:  Length: 11.2 cm. Normal cortical thickness with upper normal cortical echogenicity. Large bilobed central RIGHT renal cyst 6.5 x 3.8 x 3.0 cm, increased in size since previous exam. Slightly irregular septations at the central portion complicate the lesion. No additional mass, hydronephrosis or shadowing calcification.  Left Kidney:  Length: 11.9 cm. Normal cortical thickness. Upper normal cortical echogenicity. No mass, hydronephrosis, or shadowing calcification  Bladder:  Normal appearance. Enlarged prostate gland 6.8 x 5.2 x 5.9 cm noted.  IMPRESSION: Interval increase in size of a bilobed cyst at the mid RIGHT kidney, demonstrating slightly irregular and thick septation at the central portion ; further characterization of this lesion by Angel imaging with and without contrast recommended to exclude cystic renal neoplasm.  Significant prostatic enlargement.   Electronically Signed By: Ulyses Southward M.D. On: 06/19/2016 15:11  No valid procedures specified. No results found for this or any  previous visit.  No results found for this or any previous visit.   Assessment & Plan:    1. Benign prostatic hyperplasia with urinary obstruction -Continue rapaflo 8mg  and finasteride - Urinalysis, Routine w reflex microscopic  2. Urinary  frequency Continue mirabegron25mg  daily  3. Nocturia -continue mirabegron 25mg  daily   No follow-ups on file.  Wilkie Aye, MD  [facility] [place]

## 2023-03-19 ENCOUNTER — Encounter: Payer: Self-pay | Admitting: Urology

## 2023-03-19 NOTE — Patient Instructions (Signed)

## 2023-04-09 ENCOUNTER — Ambulatory Visit: Payer: Medicare HMO | Admitting: Internal Medicine

## 2023-04-19 ENCOUNTER — Telehealth: Payer: Self-pay

## 2023-04-19 NOTE — Telephone Encounter (Signed)
Patient is requesting generic refill of Myrbetriq 25 mg.  OUT OF SAMPLES  [id number] - [place], [place] - [address] AT [facility] Phone: [phone]  Fax: [phone]

## 2023-04-19 NOTE — Telephone Encounter (Signed)
Tried to call patient, no answer and unable to leave vm.  Rx at the pharmacy, patient will just need to request refill with pharmacy and ask to fill generic rx.

## 2023-05-10 MED ORDER — TROSPIUM CHLORIDE 20 MG PO TABS: 20.0000 mg | ORAL_TABLET | Freq: Two times a day (BID) | ORAL | 11 refills | Status: DC

## 2023-05-10 NOTE — Telephone Encounter (Signed)
Trospium sent in per Dr. Ronne Binning.  Patient called with no answer. Voicemail full.

## 2023-05-10 NOTE — Telephone Encounter (Signed)
Mirabegron is too expensive for him to get , his insurance said they will cover silodosin  or trospium 20 mg 2x a day  , please call  he would like to change medication    Ph[facility]acy [facility]  on [address]

## 2023-05-13 ENCOUNTER — Other Ambulatory Visit: Payer: Self-pay

## 2023-05-13 ENCOUNTER — Emergency Department (HOSPITAL_COMMUNITY)
Admission: EM | Admit: 2023-05-13 | Discharge: 2023-05-13 | Disposition: A | Discharge status: Home / Self Care | Payer: Medicare HMO | Attending: Emergency Medicine | Admitting: Emergency Medicine

## 2023-05-13 ENCOUNTER — Encounter (HOSPITAL_COMMUNITY): Payer: Self-pay | Admitting: *Deleted

## 2023-05-13 ENCOUNTER — Emergency Department (HOSPITAL_COMMUNITY): Payer: Medicare HMO

## 2023-05-13 DIAGNOSIS — E119 Type 2 diabetes mellitus without complications: Secondary | ICD-10-CM | POA: Insufficient documentation

## 2023-05-13 DIAGNOSIS — R0981 Nasal congestion: Secondary | ICD-10-CM | POA: Insufficient documentation

## 2023-05-13 DIAGNOSIS — R0602 Shortness of breath: Secondary | ICD-10-CM | POA: Insufficient documentation

## 2023-05-13 DIAGNOSIS — Z20822 Contact with and (suspected) exposure to covid-19: Secondary | ICD-10-CM | POA: Insufficient documentation

## 2023-05-13 DIAGNOSIS — R0789 Other chest pain: Secondary | ICD-10-CM | POA: Diagnosis present

## 2023-05-13 DIAGNOSIS — Z7982 Long term (current) use of aspirin: Secondary | ICD-10-CM | POA: Diagnosis not present

## 2023-05-13 DIAGNOSIS — Z79899 Other long term (current) drug therapy: Secondary | ICD-10-CM | POA: Diagnosis not present

## 2023-05-13 DIAGNOSIS — Z794 Long term (current) use of insulin: Secondary | ICD-10-CM | POA: Insufficient documentation

## 2023-05-13 DIAGNOSIS — Z7984 Long term (current) use of oral hypoglycemic drugs: Secondary | ICD-10-CM | POA: Insufficient documentation

## 2023-05-13 DIAGNOSIS — I10 Essential (primary) hypertension: Secondary | ICD-10-CM | POA: Insufficient documentation

## 2023-05-13 DIAGNOSIS — I2510 Atherosclerotic heart disease of native coronary artery without angina pectoris: Secondary | ICD-10-CM | POA: Insufficient documentation

## 2023-05-13 DIAGNOSIS — R079 Chest pain, unspecified: Secondary | ICD-10-CM

## 2023-05-13 LAB — BASIC METABOLIC PANEL
Anion gap: 9 (ref 5–15)
BUN: 17 mg/dL (ref 8–23)
CO2: 27 mmol/L (ref 22–32)
Calcium: 9 mg/dL (ref 8.9–10.3)
Chloride: 99 mmol/L (ref 98–111)
Creatinine, Ser: 1.08 mg/dL (ref 0.61–1.24)
GFR, Estimated: >60 mL/min (ref >60)
Glucose, Bld: 294 mg/dL — ABNORMAL HIGH (ref 70–99)
Potassium: 4 mmol/L (ref 3.5–5.1)
Sodium: 135 mmol/L (ref 135–145)

## 2023-05-13 LAB — CBC
HCT: 35.2 % — ABNORMAL LOW (ref 39.0–52.0)
Hemoglobin: 11.3 g/dL — ABNORMAL LOW (ref 13.0–17.0)
MCH: 27.2 pg (ref 26.0–34.0)
MCHC: 32.1 g/dL (ref 30.0–36.0)
MCV: 84.8 fL (ref 80.0–100.0)
Platelets: 272 10*3/uL (ref 150–400)
RBC: 4.15 MIL/uL — ABNORMAL LOW (ref 4.22–5.81)
RDW: 12.8 % (ref 11.5–15.5)
WBC: 4.7 10*3/uL (ref 4.0–10.5)
nRBC: 0 % (ref 0.0–0.2)

## 2023-05-13 LAB — RESP PANEL BY RT-PCR (RSV, FLU A&B, COVID)  RVPGX2
Influenza A by PCR: NEGATIVE
Influenza B by PCR: NEGATIVE
Resp Syncytial Virus by PCR: NEGATIVE
SARS Coronavirus 2 by RT PCR: NEGATIVE

## 2023-05-13 LAB — D-DIMER, QUANTITATIVE: D-Dimer, Quant: <0.27 ug{FEU}/mL (ref 0.00–0.50)

## 2023-05-13 LAB — TROPONIN I (HIGH SENSITIVITY)
Troponin I (High Sensitivity): 6 ng/L (ref <18)
Troponin I (High Sensitivity): 6 ng/L (ref <18)

## 2023-05-13 NOTE — Discharge Instructions (Signed)
You were seen in the emergency department for chest pain.  Your blood work, EKG and chest x-ray all looked okay We are not certain what is causing your chest x-ray we do not think you are having a heart attack or blood clot in your lungs at this time It is important that you follow-up with your primary care doctor 1 week for reevaluation Return to the emergency department for severe chest pain trouble breathing or any other concerns

## 2023-05-13 NOTE — ED Provider Notes (Addendum)
[place] EMERGENCY DEPARTMENT AT [facility] Provider Note   CSN: [id number] Arrival date & time: 05/13/23  1120     History  Chief Complaint  Patient presents with   Chest Pain    Angel Dixon is a 82 y.o. male.  With a history of CAD, type 2 diabetes and hypertension presenting for chest pain.  Patient reports episodic chest pain over the last 48 hours.  He has had 3 episodes while at rest which have been relieved with sublingual nitro at home.  Chest pain localized over bilateral upper chest without radiation to other regions.  No nausea vomiting or diaphoresis.  Has had some sinus congestion but otherwise no recent illness.   Chest Pain      Home Medications Prior to Admission medications   Medication Sig Start Date End Date Taking? Authorizing Provider  acetaminophen (TYLENOL) 500 MG tablet Take 1 tablet (500 mg total) by mouth every 6 (six) hours as needed. Patient taking differently: Take 500 mg by mouth every 6 (six) hours as needed for mild pain (pain score 1-3). 01/26/23  Yes Fayrene Helper, PA-C  amiodarone (PACERONE) 200 MG tablet Take 1 tablet (200 mg total) by mouth daily. 11/06/22  Yes Shon Hale, MD  aspirin EC 81 MG tablet Take 1 tablet (81 mg total) by mouth daily with breakfast. Swallow whole. 11/06/22  Yes Emokpae, Courage, MD  atorvastatin (LIPITOR) 40 MG tablet Take 1 tablet (40 mg total) by mouth daily. 11/06/22  Yes Emokpae, Courage, MD  benzonatate (TESSALON) 100 MG capsule Take 1 capsule (100 mg total) by mouth every 8 (eight) hours. Patient taking differently: Take 100 mg by mouth 3 (three) times daily as needed for cough. 01/26/23  Yes Fayrene Helper, PA-C  dapagliflozin propanediol (FARXIGA) 5 MG TABS tablet Take 1 tablet (5 mg total) by mouth daily. 11/06/22  Yes Emokpae, Courage, MD  finasteride (PROSCAR) 5 MG tablet Take 1 tablet (5 mg total) by mouth daily. 03/13/23  Yes McKenzie, Mardene Celeste, MD  fluticasone (FLONASE) 50 MCG/ACT nasal spray  Place 1 spray into both nostrils as needed for allergies or rhinitis.  09/20/14  Yes [provider]  glipiZIDE (GLUCOTROL) 5 MG tablet Take 1 tablet (5 mg total) by mouth 2 (two) times daily with a meal. 11/06/22  Yes Emokpae, Courage, MD  isosorbide mononitrate (IMDUR) 30 MG 24 hr tablet Take 15 mg by mouth daily. 02/10/23  Yes [provider]  meclizine (ANTIVERT) 25 MG tablet Take 25 mg by mouth 3 (three) times daily as needed for dizziness. 11/27/22  Yes [provider]  metoprolol tartrate (LOPRESSOR) 25 MG tablet Take 1 tablet (25 mg total) by mouth 2 (two) times daily. Patient taking differently: Take 25 mg by mouth daily. 11/06/22  Yes Emokpae, Courage, MD  mirabegron ER (MYRBETRIQ) 25 MG TB24 tablet Take 1 tablet (25 mg total) by mouth daily. 03/13/23  Yes McKenzie, Mardene Celeste, MD  nitroGLYCERIN (NITROSTAT) 0.4 MG SL tablet Place 1 tablet (0.4 mg total) under the tongue every 5 (five) minutes as needed for chest pain (severe chest pain or pressure only). 08/12/20 05/19/29 Yes Hilty, Lisette Abu, MD  pantoprazole (PROTONIX) 40 MG tablet Take 1 tablet (40 mg total) by mouth 2 (two) times daily. 11/06/22  Yes Emokpae, Courage, MD  Polyethylene Glycol 400 (BLINK TEARS OP) Place 1 drop into both eyes 2 (two) times daily.   Yes [provider]  silodosin (RAPAFLO) 8 MG CAPS capsule Take 1 capsule (8  mg total) by mouth at bedtime. 03/13/23  Yes McKenzie, Mardene Celeste, MD  trospium (SANCTURA) 20 MG tablet Take 1 tablet (20 mg total) by mouth 2 (two) times daily. 05/10/23  Yes McKenzie, Mardene Celeste, MD  cetirizine (ZYRTEC) 10 MG tablet Take 10 mg by mouth daily as needed for allergies. Patient not taking: Reported on 05/13/2023    Medicine, [facility]  glipiZIDE (GLUCOTROL XL) 5 MG 24 hr tablet Take 10 mg by mouth daily. 12/16/22   [provider]  ibuprofen (ADVIL) 800 MG tablet Take 800 mg by mouth 3 (three) times daily as needed. Patient not taking: Reported on 05/13/2023  05/10/23   [provider]  NOVOLIN N 100 UNIT/ML injection Inject 10 Units into the skin 2 (two) times daily as needed (If Blood Sugar gets above 100). Patient not taking: Reported on 05/13/2023 10/17/22   [provider]  ONETOUCH ULTRA test strip USE 1 STRIP TO CHECK BLOOD GLUCOSE LEVEL TWICE DAILY. 11/22/20   [provider]      Allergies    Lisinopril, Sulfa antibiotics, Sulfacetamide, Vioxx [rofecoxib], and Yellow jacket venom [bee venom]    Review of Systems   Review of Systems  Cardiovascular:  Positive for chest pain.    Physical Exam Updated Vital Signs BP 122/77 (BP Location: Right Arm)   Pulse 97   Temp 97.8 F (36.6 C) (Oral)   Resp 18   Ht 5\' 8"  (1.727 m)   Wt 63 kg   SpO2 98%   BMI 21.13 kg/m  Physical Exam Vitals and nursing note reviewed.  HENT:     Head: Normocephalic and atraumatic.  Eyes:     Pupils: Pupils are equal, round, and reactive to light.  Cardiovascular:     Rate and Rhythm: Normal rate and regular rhythm.  Pulmonary:     Effort: Pulmonary effort is normal.     Breath sounds: Normal breath sounds.  Abdominal:     Palpations: Abdomen is soft.     Tenderness: There is no abdominal tenderness.  Musculoskeletal:     Right lower leg: No edema.     Left lower leg: No edema.  Skin:    General: Skin is warm and dry.  Neurological:     Mental Status: He is alert.  Psychiatric:        Mood and Affect: Mood normal.     ED Results / Procedures / Treatments   Labs (all labs ordered are listed, but only abnormal results are displayed) Labs Reviewed  BASIC METABOLIC PANEL - Abnormal; Notable for the following components:      Result Value   Glucose, Bld 294 (*)    All other components within normal limits  CBC - Abnormal; Notable for the following components:   RBC 4.15 (*)    Hemoglobin 11.3 (*)    HCT 35.2 (*)    All other components within normal limits  RESP PANEL BY RT-PCR (RSV, FLU A&B, COVID)  [id number]   D-DIMER, QUANTITATIVE  TROPONIN I (HIGH SENSITIVITY)  TROPONIN I (HIGH SENSITIVITY)    EKG EKG Interpretation Date/Time:  Monday May 13 2023 11:34:00 EST Ventricular Rate:  85 PR Interval:  160 QRS Duration:  78 QT Interval:  368 QTC Calculation: 437 R Axis:   42  Text Interpretation: Sinus rhythm with frequent Premature ventricular complexes Possible Left atrial enlargement Cannot rule out Inferior infarct , age undetermined Cannot rule out Anterior infarct , age undetermined Abnormal ECG When compared with ECG  of 31-Jan-2023 10:01, PREVIOUS ECG IS PRESENT Confirmed by Estelle June [phone]) on 05/13/2023 4:08:30 PM  Radiology DG Chest 2 View  Result Date: 05/13/2023 CLINICAL DATA:  Chest pain EXAM: CHEST - 2 VIEW COMPARISON:  X-ray 01/29/2023. FINDINGS: No pneumothorax, effusion or edema. No consolidation. Normal cardiopericardial silhouette. Degenerative changes of the spine. Overlapping cardiac leads. IMPRESSION: No acute cardiopulmonary disease. Electronically Signed   By: Karen Kays M.D.   On: 05/13/2023 13:43    Procedures Procedures    Medications Ordered in ED Medications - No data to display  ED Course/ Medical Decision Making/ A&P Clinical Course as of 05/13/23 1608  Mon May 13, 2023  1606 Initial troponin of 6 with flat delta troponin.  Low suspicion for ACS.  No ischemic changes on EKG.  D-dimer less than 0.27 not consistent with venous thromboembolism.  No significant leukocytosis or anemia no metabolic disturbances on metabolic panel.  Chest x-ray shows no focal consolidation.  Patient reports significant improvement in his chest pain unclear cause of his chest pain but he will follow-up with his primary care doctor return precautions were discussed with him and his family members at the bedside in detail.  Stable for discharge at this time [MP]    Clinical Course User Index [MP] Royanne Foots, DO                                 Medical Decision  Making 82 year old male with history as above presenting for episodic chest pain in the last 48 hours.  3 episodes of chest pain have been relieved by nitro.  Currently chest pain-free in the ED.  Benign physical exam without evidence of peripheral edema.  No abdominal tenderness.  Presentation concerning for ACS and high risk elderly male.  Will obtain delta troponin EKG and continue to monitor on telemetry.  Other differentials to consider would be pulmonary embolism in the setting of chest pain with associated shortness of breath.  Will obtain D-dimer and CTA of the chest if elevated.  Will also evaluate for pneumonia with chest x-ray and obtain viral respiratory swab given reported nasal congestion at the onset of the symptoms.  Amount and/or Complexity of Data Reviewed Labs: ordered. Radiology: ordered.          Final Clinical Impression(s) / ED Diagnoses Final diagnoses:  Chest pain, unspecified type    Rx / DC Orders ED Discharge Orders     None         Royanne Foots, DO 05/13/23 1608    Royanne Foots, DO 05/13/23 1608

## 2023-05-13 NOTE — ED Triage Notes (Signed)
Pt mid CP and SOB that started yesterday while walking, + radiation left arm.

## 2023-05-19 ENCOUNTER — Other Ambulatory Visit: Payer: Self-pay | Admitting: Urology

## 2023-06-18 ENCOUNTER — Encounter (INDEPENDENT_AMBULATORY_CARE_PROVIDER_SITE_OTHER): Payer: Self-pay | Admitting: *Deleted

## 2023-07-10 ENCOUNTER — Encounter (INDEPENDENT_AMBULATORY_CARE_PROVIDER_SITE_OTHER): Payer: Self-pay | Admitting: Gastroenterology

## 2023-07-10 ENCOUNTER — Ambulatory Visit (INDEPENDENT_AMBULATORY_CARE_PROVIDER_SITE_OTHER): Payer: Medicare HMO | Admitting: Gastroenterology

## 2023-07-10 VITALS — BP 136/78 | HR 73 | Temp 97.5°F | Ht 68.0 in | Wt 141.0 lb

## 2023-07-10 DIAGNOSIS — K921 Melena: Secondary | ICD-10-CM

## 2023-07-10 DIAGNOSIS — R1310 Dysphagia, unspecified: Secondary | ICD-10-CM | POA: Diagnosis not present

## 2023-07-10 DIAGNOSIS — K5900 Constipation, unspecified: Secondary | ICD-10-CM

## 2023-07-10 DIAGNOSIS — R1319 Other dysphagia: Secondary | ICD-10-CM

## 2023-07-10 DIAGNOSIS — D649 Anemia, unspecified: Secondary | ICD-10-CM | POA: Diagnosis not present

## 2023-07-10 DIAGNOSIS — Z860100 Personal history of colon polyps, unspecified: Secondary | ICD-10-CM

## 2023-07-10 DIAGNOSIS — K219 Gastro-esophageal reflux disease without esophagitis: Secondary | ICD-10-CM

## 2023-07-10 MED ORDER — PEG 3350-KCL-NA BICARB-NACL 420 G PO SOLR: 4000.0000 mL | Freq: Once | ORAL | 0 refills | Status: DC

## 2023-07-10 NOTE — Progress Notes (Signed)
 Angel Dixon Angel Dixon , M.D. Gastroenterology & Hepatology [facility] Gastroenterology [address] [place], [place] [address] Primary Care Physician: Angel Norleen PEDLAR, MD [address] Angel Dixon [place] [address]  Chief Complaint: Dysphagia, normocytic anemia, black-colored stools, surveillance colonoscopy  History of Present Illness: Angel Dixon is a 83 y.o. male with PMH DM, CAD, HTN, GERD, HLD, who presents for evaluation of Dysphagia, normocytic anemia, black-colored stools, surveillance colonoscopy  Patient reports that he has been noticing black-colored stools for the past 1 year.  It is usually solid and not liquid.  Denies taking iron tablets or Pepto-Bismol.  Patient has occasional dysphagia as if food getting stuck in upper chest without any choking or odynophagia.The patient denies having any nausea, vomiting, fever, chills, hematochezia, hematemesis, abdominal distention, abdominal pain, diarrhea, jaundice, pruritus or weight loss.  Patient had upper endoscopy and colonoscopy scheduled twice in the past 1 year and had to reschedule once because of family issues and had time he had surgery for inguinal hernia repair  Last Colonoscopy:2018 - Melanosis in the colon.                           - One 8 mm polyp in the ascending colon, removed                            with a hot snare. Resected and retrieved. Clip (MR                            conditional) was placed. (TA)                           - Diverticulosis in the sigmoid colon.                           - External hemorrhoids.   Last Endoscopy: 15 years ago   Recommendations:  Repeat colonoscopy due 2023  FHx: neg for any gastrointestinal/liver disease, father had cancer unknown primary Social: former smoking but quit 40 years ago, alcohol  or illicit drug use Surgical: inguinal hernia  Past Medical History: Past Medical History:  Diagnosis Date   Angina    CAD (coronary artery  disease)    moderate, by cath   Diabetes mellitus    DJD (degenerative joint disease)    lumbar lam 4/05   Dyslipidemia    Dysrhythmia    palpitation, PVCs   GERD (gastroesophageal reflux disease)    Hypertension    PUD (peptic ulcer disease)    endo 03/15/10    Past Surgical History: Past Surgical History:  Procedure Laterality Date   BACK SURGERY  2005   CARDIAC CATHETERIZATION  02/22/2006   mild obs. CAD 60-70% narrowing in small inferior branch of 1st diagonal of LAD, smooth 10% narrowing of mid RCA (Dr. 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 FABIENE Dixon)   CARDIAC CATHETERIZATION  05/11/2011   nonobstructive CAD (Dr. CHARM Dixon)   COLONOSCOPY N/A 06/13/2016   Procedure: COLONOSCOPY;  Surgeon: Angel RAYMOND Rivet, MD;  Location: [facility] END[facility] SUITE;  Service: Endoscopy;  Laterality: N/A;  1200   HERNIA REPAIR     INGUINAL HERNIA REPAIR  1982   right  LEFT HEART CATHETERIZATION WITH CORONARY ANGIOGRAM N/A 05/14/2011   Procedure: LEFT HEART CATHETERIZATION WITH CORONARY ANGIOGRAM;  Surgeon: Angel LELON Clay, MD;  Location: [facility] CATH LAB;  Service: Cardiovascular;  Laterality: N/A;  Coronary angiogram and possible PCI   LUMBAR LAMINECTOMY  09/2003   L2-3, 3-4, 4-5   NM MYOCAR PERF WALL MOTION  10/03/2009   bruce myoview  - normal perfusion in all regions, EF 70%, no ischemia, low risk   TRANSTHORACIC ECHOCARDIOGRAM  03/28/2012   EF 65-70%, grade 1 diastolic dysfunction, mod AV calcification, mild MR   XI ROBOTIC ASSISTED INGUINAL HERNIA REPAIR WITH MESH Left 12/20/2022   Procedure: XI ROBOTIC ASSISTED INGUINAL HERNIA REPAIR WITH MESH;  Surgeon: Angel Anes, MD;  Location: [facility] ORS;  Service: General;  Laterality: Left;    Family History: Family History  Problem Relation Age of Onset   Kidney failure Mother        died 28   Coronary artery disease Father        died 14   Heart attack Sister         died of MI in her 82s   Sarcoidosis Daughter        died 60    Social History: Social History   Tobacco Use  Smoking Status Former   Current packs/day: 1.00   Average packs/day: 1 pack/day for 25.0 years (25.0 ttl pk-yrs)   Types: Cigarettes   Passive exposure: Past  Smokeless Tobacco Never  Tobacco Comments   quit smoking cigarettes ~ 1990   Social History   Substance and Sexual Activity  Alcohol  Use Yes   Comment: 05/11/11 shot every now and then   Social History   Substance and Sexual Activity  Drug Use No    Allergies: Allergies  Allergen Reactions   Lisinopril  Other (See Comments)    Sore throat, no obvious angioedema or cough   Sulfa Antibiotics Hives   Sulfacetamide Hives   Vioxx [Rofecoxib] Hives   Yellow Jacket Venom [Bee Venom] Swelling    Medications: Current Outpatient Medications  Medication Sig Dispense Refill   acetaminophen  (TYLENOL ) 500 MG tablet Take 1 tablet (500 mg total) by mouth every 6 (six) hours as needed. (Patient taking differently: Take 500 mg by mouth every 6 (six) hours as needed for mild pain (pain score 1-3).) 30 tablet 0   amiodarone  (PACERONE ) 200 MG tablet Take 1 tablet (200 mg total) by mouth daily. 90 tablet 3   aspirin  EC 81 MG tablet Take 1 tablet (81 mg total) by mouth daily with breakfast. Swallow whole. 90 tablet 2   atorvastatin  (LIPITOR) 40 MG tablet Take 1 tablet (40 mg total) by mouth daily. 90 tablet 3   cetirizine (ZYRTEC) 10 MG tablet Take 10 mg by mouth daily as needed for allergies.     dapagliflozin  propanediol (FARXIGA ) 5 MG TABS tablet Take 1 tablet (5 mg total) by mouth daily. 30 tablet 5   finasteride  (PROSCAR ) 5 MG tablet Take 1 tablet (5 mg total) by mouth daily. 90 tablet 3   fluticasone  (FLONASE ) 50 MCG/ACT nasal spray Place 1 spray into both nostrils as needed for allergies or rhinitis.   11   glipiZIDE  (GLUCOTROL  XL) 5 MG 24 hr tablet Take 10 mg by mouth daily.     ibuprofen  (ADVIL ) 800 MG tablet Take  800 mg by mouth 3 (three) times daily as needed.     isosorbide  mononitrate (IMDUR ) 30 MG 24 hr tablet Take 15 mg by mouth daily.  meclizine (ANTIVERT) 25 MG tablet Take 25 mg by mouth 3 (three) times daily as needed for dizziness.     metoprolol tartrate (LOPRESSOR) 25 MG tablet Take 1 tablet (25 mg total) by mouth 2 (two) times daily. (Patient taking differently: Take 25 mg by mouth daily.) 180 tablet 3   nitroGLYCERIN (NITROSTAT) 0.4 MG SL tablet Place 1 tablet (0.4 mg total) under the tongue every 5 (five) minutes as needed for chest pain (severe chest pain or pressure only). 25 tablet 3   NOVOLIN N 100 UNIT/ML injection Inject 10 Units into the skin 2 (two) times daily as needed (If Blood Sugar gets above 100).     ONETOUCH ULTRA test strip USE 1 STRIP TO CHECK BLOOD GLUCOSE LEVEL TWICE DAILY.     pantoprazole (PROTONIX) 40 MG tablet Take 1 tablet (40 mg total) by mouth 2 (two) times daily. 60 tablet 2   Polyethylene Glycol 400 (BLINK TEARS OP) Place 1 drop into both eyes 2 (two) times daily.     silodosin (RAPAFLO) 8 MG CAPS capsule Take 1 capsule (8 mg total) by mouth at bedtime. 90 capsule 3   trospium (SANCTURA) 20 MG tablet Take 1 tablet (20 mg total) by mouth 2 (two) times daily. 60 tablet 11   Vibegron (GEMTESA) 75 MG TABS Take 1 tablet (75 mg total) by mouth daily. 30 tablet 11   No current facility-administered medications for this visit.    Review of Systems: GENERAL: negative for malaise, night sweats HEENT: No changes in hearing or vision, no nose bleeds or other nasal problems. NECK: Negative for lumps, goiter, pain and significant neck swelling RESPIRATORY: Negative for cough, wheezing CARDIOVASCULAR: Negative for chest pain, leg swelling, palpitations, orthopnea GI: SEE HPI MUSCULOSKELETAL: Negative for joint pain or swelling, back pain, and muscle pain. SKIN: Negative for lesions, rash HEMATOLOGY Negative for prolonged bleeding, bruising easily, and swollen  nodes. ENDOCRINE: Negative for cold or heat intolerance, polyuria, polydipsia and goiter. NEURO: negative for tremor, gait imbalance, syncope and seizures. The remainder of the review of systems is noncontributory.   Physical Exam: BP 136/78   Pulse 73   Temp (!) 97.5 F (36.4 C)   Ht 5\' 8"  (1.727 m)   Wt 141 lb (64 kg)   BMI 21.44 kg/m  GENERAL: The patient is AO x3, in no acute distress. HEENT: Head is normocephalic and atraumatic. EOMI are intact. Mouth is well hydrated and without lesions. NECK: Supple. No masses LUNGS: Clear to auscultation. No presence of rhonchi/wheezing/rales. Adequate chest expansion HEART: RRR, normal s1 and s2. ABDOMEN: Soft, nontender, no guarding, no peritoneal signs, and nondistended. BS +. No masses.  Imaging/Labs: as above     Latest Ref Rng & Units 05/13/2023   11:43 AM 01/29/2023   11:12 PM 01/28/2023    6:13 AM  CBC  WBC 4.0 - 10.5 K/uL 4.7  5.2  4.5   Hemoglobin 13.0 - 17.0 g/dL 40.9  81.1  91.4   Hematocrit 39.0 - 52.0 % 35.2  34.8  35.3   Platelets 150 - 400 K/uL 272  255  208    No results found for: "IRON", "TIBC", "FERRITIN"  I personally reviewed and interpreted the available labs, imaging and endoscopic files.  Impression and Plan: Johaan RUEBEN KASSIM is a 83 y.o. male with PMH DM, CAD, HTN, GERD, HLD, who presents for evaluation of Dysphagia, normocytic anemia, black-colored stools, surveillance colonoscopy.  #Dysphagia #Melena #Surveillance colonoscopy #Normocytic anemia  #Intermittent constipation   Patient had  upper endoscopy and colonoscopy scheduled twice in the past 1 year and had to reschedule once because of family issues and had time he had surgery for inguinal hernia repair  Anemia dysphagia and concerns for melena are all red flags and endoscopic evaluation is indicated.  Need to rule out red ring stricture and rule out malignancy. Will proceed with upper endoscopy with possible dilation  Also he is due for  repeat colonoscopy due to history of colon polyps last colonoscopy performed 2018 suggest repeat thousand 2013  Ensure adequate fluid intake: Aim for 8 glasses of water daily. Follow a high fiber diet: Include foods such as dates, prunes, pears, and kiwi. Take Miralax twice a day for the first week, then reduce to once daily thereafter. Use Metamucil twice a day.   All questions were answered.      [name], MD Gastroenterology and Hepatology [facility] Gastroenterology   This chart has been completed using [facility] [facility] [employer] Dictation software, and while attempts have been made to ensure accuracy , certain words and phrases may not be transcribed as intended

## 2023-07-10 NOTE — H&P (View-Only) (Signed)
 Angel Goodwill Faizan Elias Dixon , M.D. Gastroenterology & Hepatology [facility] Gastroenterology [address] [place], [place] [address] Primary Care Physician: Angel Norleen PEDLAR, MD [address] Angel Dixon [place] [address]  Chief Complaint: Dysphagia, normocytic anemia, black-colored stools, surveillance colonoscopy  History of Present Illness: Angel Dixon is a 83 y.o. male with PMH DM, CAD, HTN, GERD, HLD, who presents for evaluation of Dysphagia, normocytic anemia, black-colored stools, surveillance colonoscopy  Patient reports that he has been noticing black-colored stools for the past 1 year.  It is usually solid and not liquid.  Denies taking iron tablets or Pepto-Bismol.  Patient has occasional dysphagia as if food getting stuck in upper chest without any choking or odynophagia.The patient denies having any nausea, vomiting, fever, chills, hematochezia, hematemesis, abdominal distention, abdominal pain, diarrhea, jaundice, pruritus or weight loss.  Patient had upper endoscopy and colonoscopy scheduled twice in the past 1 year and had to reschedule once because of family issues and had time he had surgery for inguinal hernia repair  Last Colonoscopy:2018 - Melanosis in the colon.                           - One 8 mm polyp in the ascending colon, removed                            with a hot snare. Resected and retrieved. Clip (MR                            conditional) was placed. (TA)                           - Diverticulosis in the sigmoid colon.                           - External hemorrhoids.   Last Endoscopy: 15 years ago   Recommendations:  Repeat colonoscopy due 2023  FHx: neg for any gastrointestinal/liver disease, father had cancer unknown primary Social: former smoking but quit 40 years ago, alcohol  or illicit drug use Surgical: inguinal hernia  Past Medical History: Past Medical History:  Diagnosis Date   Angina    CAD (coronary artery  disease)    moderate, by cath   Diabetes mellitus    DJD (degenerative joint disease)    lumbar lam 4/05   Dyslipidemia    Dysrhythmia    palpitation, PVCs   GERD (gastroesophageal reflux disease)    Hypertension    PUD (peptic ulcer disease)    endo 03/15/10    Past Surgical History: Past Surgical History:  Procedure Laterality Date   BACK SURGERY  2005   CARDIAC CATHETERIZATION  02/22/2006   mild obs. CAD 60-70% narrowing in small inferior branch of 1st diagonal of LAD, smooth 10% narrowing of mid RCA (Dr. 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 FABIENE Pinion)   CARDIAC CATHETERIZATION  05/11/2011   nonobstructive CAD (Dr. CHARM Clay)   COLONOSCOPY N/A 06/13/2016   Procedure: COLONOSCOPY;  Surgeon: Angel RAYMOND Rivet, MD;  Location: [facility];  Service: Endoscopy;  Laterality: N/A;  1200   HERNIA REPAIR     INGUINAL HERNIA REPAIR  1982   right  LEFT HEART CATHETERIZATION WITH CORONARY ANGIOGRAM N/A 05/14/2011   Procedure: LEFT HEART CATHETERIZATION WITH CORONARY ANGIOGRAM;  Surgeon: Alm LELON Clay, MD;  Location: [facility] CATH LAB;  Service: Cardiovascular;  Laterality: N/A;  Coronary angiogram and possible PCI   LUMBAR LAMINECTOMY  09/2003   L2-3, 3-4, 4-5   NM MYOCAR PERF WALL MOTION  10/03/2009   bruce myoview  - normal perfusion in all regions, EF 70%, no ischemia, low risk   TRANSTHORACIC ECHOCARDIOGRAM  03/28/2012   EF 65-70%, grade 1 diastolic dysfunction, mod AV calcification, mild MR   XI ROBOTIC ASSISTED INGUINAL HERNIA REPAIR WITH MESH Left 12/20/2022   Procedure: XI ROBOTIC ASSISTED INGUINAL HERNIA REPAIR WITH MESH;  Surgeon: Mavis Anes, MD;  Location: [facility] ORS;  Service: General;  Laterality: Left;    Family History: Family History  Problem Relation Age of Onset   Kidney failure Mother        died 28   Coronary artery disease Father        died 14   Heart attack Sister         died of MI in her 82s   Sarcoidosis Daughter        died 60    Social History: Social History   Tobacco Use  Smoking Status Former   Current packs/day: 1.00   Average packs/day: 1 pack/day for 25.0 years (25.0 ttl pk-yrs)   Types: Cigarettes   Passive exposure: Past  Smokeless Tobacco Never  Tobacco Comments   quit smoking cigarettes ~ 1990   Social History   Substance and Sexual Activity  Alcohol  Use Yes   Comment: 05/11/11 shot every now and then   Social History   Substance and Sexual Activity  Drug Use No    Allergies: Allergies  Allergen Reactions   Lisinopril  Other (See Comments)    Sore throat, no obvious angioedema or cough   Sulfa Antibiotics Hives   Sulfacetamide Hives   Vioxx [Rofecoxib] Hives   Yellow Jacket Venom [Bee Venom] Swelling    Medications: Current Outpatient Medications  Medication Sig Dispense Refill   acetaminophen  (TYLENOL ) 500 MG tablet Take 1 tablet (500 mg total) by mouth every 6 (six) hours as needed. (Patient taking differently: Take 500 mg by mouth every 6 (six) hours as needed for mild pain (pain score 1-3).) 30 tablet 0   amiodarone  (PACERONE ) 200 MG tablet Take 1 tablet (200 mg total) by mouth daily. 90 tablet 3   aspirin  EC 81 MG tablet Take 1 tablet (81 mg total) by mouth daily with breakfast. Swallow whole. 90 tablet 2   atorvastatin  (LIPITOR) 40 MG tablet Take 1 tablet (40 mg total) by mouth daily. 90 tablet 3   cetirizine (ZYRTEC) 10 MG tablet Take 10 mg by mouth daily as needed for allergies.     dapagliflozin  propanediol (FARXIGA ) 5 MG TABS tablet Take 1 tablet (5 mg total) by mouth daily. 30 tablet 5   finasteride  (PROSCAR ) 5 MG tablet Take 1 tablet (5 mg total) by mouth daily. 90 tablet 3   fluticasone  (FLONASE ) 50 MCG/ACT nasal spray Place 1 spray into both nostrils as needed for allergies or rhinitis.   11   glipiZIDE  (GLUCOTROL  XL) 5 MG 24 hr tablet Take 10 mg by mouth daily.     ibuprofen  (ADVIL ) 800 MG tablet Take  800 mg by mouth 3 (three) times daily as needed.     isosorbide  mononitrate (IMDUR ) 30 MG 24 hr tablet Take 15 mg by mouth daily.  meclizine (ANTIVERT) 25 MG tablet Take 25 mg by mouth 3 (three) times daily as needed for dizziness.     metoprolol  tartrate (LOPRESSOR ) 25 MG tablet Take 1 tablet (25 mg total) by mouth 2 (two) times daily. (Patient taking differently: Take 25 mg by mouth daily.) 180 tablet 3   nitroGLYCERIN  (NITROSTAT ) 0.4 MG SL tablet Place 1 tablet (0.4 mg total) under the tongue every 5 (five) minutes as needed for chest pain (severe chest pain or pressure only). 25 tablet 3   NOVOLIN N 100 UNIT/ML injection Inject 10 Units into the skin 2 (two) times daily as needed (If Blood Sugar gets above 100).     ONETOUCH ULTRA test strip USE 1 STRIP TO CHECK BLOOD GLUCOSE LEVEL TWICE DAILY.     pantoprazole  (PROTONIX ) 40 MG tablet Take 1 tablet (40 mg total) by mouth 2 (two) times daily. 60 tablet 2   Polyethylene Glycol 400 (BLINK TEARS OP) Place 1 drop into both eyes 2 (two) times daily.     silodosin  (RAPAFLO ) 8 MG CAPS capsule Take 1 capsule (8 mg total) by mouth at bedtime. 90 capsule 3   trospium  (SANCTURA ) 20 MG tablet Take 1 tablet (20 mg total) by mouth 2 (two) times daily. 60 tablet 11   Vibegron (GEMTESA) 75 MG TABS Take 1 tablet (75 mg total) by mouth daily. 30 tablet 11   No current facility-administered medications for this visit.    Review of Systems: GENERAL: negative for malaise, night sweats HEENT: No changes in hearing or vision, no nose bleeds or other nasal problems. NECK: Negative for lumps, goiter, pain and significant neck swelling RESPIRATORY: Negative for cough, wheezing CARDIOVASCULAR: Negative for chest pain, leg swelling, palpitations, orthopnea GI: SEE HPI MUSCULOSKELETAL: Negative for joint pain or swelling, back pain, and muscle pain. SKIN: Negative for lesions, rash HEMATOLOGY Negative for prolonged bleeding, bruising easily, and swollen  nodes. ENDOCRINE: Negative for cold or heat intolerance, polyuria, polydipsia and goiter. NEURO: negative for tremor, gait imbalance, syncope and seizures. The remainder of the review of systems is noncontributory.   Physical Exam: BP 136/78   Pulse 73   Temp (!) 97.5 F (36.4 C)   Ht 5' 8 (1.727 m)   Wt 141 lb (64 kg)   BMI 21.44 kg/m  GENERAL: The patient is AO x3, in no acute distress. HEENT: Head is normocephalic and atraumatic. EOMI are intact. Mouth is well hydrated and without lesions. NECK: Supple. No masses LUNGS: Clear to auscultation. No presence of rhonchi/wheezing/rales. Adequate chest expansion HEART: RRR, normal s1 and s2. ABDOMEN: Soft, nontender, no guarding, no peritoneal signs, and nondistended. BS +. No masses.  Imaging/Labs: as above     Latest Ref Rng & Units 05/13/2023   11:43 AM 01/29/2023   11:12 PM 01/28/2023    6:13 AM  CBC  WBC 4.0 - 10.5 K/uL 4.7  5.2  4.5   Hemoglobin 13.0 - 17.0 g/dL 88.6  88.6  88.2   Hematocrit 39.0 - 52.0 % 35.2  34.8  35.3   Platelets 150 - 400 K/uL 272  255  208    No results found for: IRON, TIBC, FERRITIN  I personally reviewed and interpreted the available labs, imaging and endoscopic files.  Impression and Plan: Angel Dixon is a 83 y.o. male with PMH DM, CAD, HTN, GERD, HLD, who presents for evaluation of Dysphagia, normocytic anemia, black-colored stools, surveillance colonoscopy.  #Dysphagia #Melena #Surveillance colonoscopy #Normocytic anemia  #Intermittent constipation   Patient had  upper endoscopy and colonoscopy scheduled twice in the past 1 year and had to reschedule once because of family issues and had time he had surgery for inguinal hernia repair  Anemia dysphagia and concerns for melena are all red flags and endoscopic evaluation is indicated.  Need to rule out red ring stricture and rule out malignancy. Will proceed with upper endoscopy with possible dilation  Also he is due for  repeat colonoscopy due to history of colon polyps last colonoscopy performed 2018 suggest repeat thousand 2013  Ensure adequate fluid intake: Aim for 8 glasses of water  daily. Follow a high fiber diet: Include foods such as dates, prunes, pears, and kiwi. Take Miralax  twice a day for the first week, then reduce to once daily thereafter. Use Metamucil twice a day.   All questions were answered.      Jadan Hinojos Faizan Tehillah Cipriani, MD Gastroenterology and Hepatology [facility] Gastroenterology   This chart has been completed using [facility] Dictation software, and while attempts have been made to ensure accuracy , certain words and phrases may not be transcribed as intended

## 2023-07-10 NOTE — Addendum Note (Signed)
 Addended by: Oluwaseun Cremer on: 07/10/2023 04:41 PM   Modules accepted: Orders

## 2023-07-10 NOTE — Patient Instructions (Signed)
 It was very nice to meet you today, as dicussed with will plan for the following :  1) Upper endoscopy and Colonoscopy   2) Ensure adequate fluid intake: Aim for 8 glasses of water daily. Follow a high fiber diet: Include foods such as dates, prunes, pears, and kiwi. Take Miralax twice a day for the first week, then reduce to once daily thereafter. Use Metamucil twice a day.

## 2023-07-16 ENCOUNTER — Encounter (HOSPITAL_COMMUNITY): Payer: Self-pay

## 2023-07-16 ENCOUNTER — Encounter (HOSPITAL_COMMUNITY)
Admission: RE | Admit: 2023-07-16 | Discharge: 2023-07-16 | Disposition: A | Discharge status: Home / Self Care | Payer: Medicare HMO | Source: Ambulatory Visit | Attending: Gastroenterology | Admitting: Gastroenterology

## 2023-07-16 HISTORY — DX: Palpitations: R00.2

## 2023-07-16 NOTE — Patient Instructions (Signed)
 Angel Dixon  07/16/2023     @PREFPERIOPPHARMACY @   Your procedure is scheduled on 07/18/2023.   Report to [facility] at 0845 A.M.   Call this number if you have problems the morning of surgery:  [phone]  If you experience any cold or flu symptoms such as cough, fever, chills, shortness of breath, etc. between now and your scheduled surgery, please notify us at the above number.   Remember:    Please follow the diet and prep Instructions given to you by Dr Marcelino Freestone office.   You may drink clear liquids until 0645 .  Clear liquids allowed are:                    Water, Juice (No red color; non-citric and without pulp; diabetics please choose diet or no sugar options), Clear Tea (No creamer, milk, or cream, including half & half and powdered creamer), Black Coffee Only (No creamer, milk or cream, including half & half and powdered creamer), Plain Jell-O Only (No red color; diabetics please choose no sugar options), and Clear Sports drink (No red color; diabetics please choose diet or no sugar options)    Take these medicines the morning of surgery with A SIP OF WATER : Amlodipine Isosorbide Meclizine Metoprolol Vibegron    Do not wear jewelry, make-up or nail polish, including gel polish,  artificial nails, or any other type of covering on natural nails (fingers and  toes).  Do not wear lotions, powders, or perfumes, or deodorant.  Do not shave 48 hours prior to surgery.  Men may shave face and neck.  Do not bring valuables to the hospital.  [facility] is not responsible for any belongings or valuables.  Contacts, dentures or bridgework may not be worn into surgery.  Leave your suitcase in the car.  After surgery it may be brought to your room.  For patients admitted to the hospital, discharge time will be determined by your treatment team.  Patients discharged the day of surgery will not be allowed to drive home.   Name and phone number of your driver:    Family  Special instructions:  N/A  Please read over the following fact sheets that you were given.  Care and Recovery After Surgery  Colonoscopy, Adult A colonoscopy is a procedure to look at the entire large intestine. This procedure is done using a long, thin, flexible tube that has a camera on the end. You may have a colonoscopy: As a part of normal colorectal screening. If you have certain symptoms, such as: A low number of red blood cells in your blood (anemia). Diarrhea that does not go away. Pain in your abdomen. Blood in your stool. A colonoscopy can help screen for and diagnose medical problems, including: An abnormal growth of cells or tissue (tumor). Abnormal growths within the lining of your intestine (polyps). Inflammation. Areas of bleeding. Tell your health care provider about: Any allergies you have. All medicines you are taking, including vitamins, herbs, eye drops, creams, and over-the-counter medicines. Any problems you or family members have had with anesthetic medicines. Any bleeding problems you have. Any surgeries you have had. Any medical conditions you have. Any problems you have had with having bowel movements. Whether you are pregnant or may be pregnant. What are the risks? Generally, this is a safe procedure. However, problems may occur, including: Bleeding. Damage to your intestine. Allergic reactions to medicines given during the procedure. Infection. This is rare. What  happens before the procedure? Eating and drinking restrictions Follow instructions from your health care provider about eating or drinking restrictions, which may include: A few days before the procedure: Follow a low-fiber diet. Avoid nuts, seeds, dried fruit, raw fruits, and vegetables. 1-3 days before the procedure: Eat only gelatin dessert or ice pops. Drink only clear liquids, such as water, clear juice, clear broth or bouillon, black coffee or tea, or clear soft drinks  or sports drinks. Avoid liquids that contain red or purple dye. The day of the procedure: Do not eat solid foods. You may continue to drink clear liquids until up to 2 hours before the procedure. Do not eat or drink anything starting 2 hours before the procedure, or within the time period that your health care provider recommends. Bowel prep If you were prescribed a bowel prep to take by mouth (orally) to clean out your colon: Take it as told by your health care provider. Starting the day before your procedure, you will need to drink a large amount of liquid medicine. The liquid will cause you to have many bowel movements of loose stool until your stool becomes almost clear or light green. If your skin or the opening between the buttocks (anus) gets irritated from diarrhea, you may relieve the irritation using: Wipes with medicine in them, such as adult wet wipes with aloe and vitamin E. A product to soothe skin, such as petroleum jelly. If you vomit while drinking the bowel prep: Take a break for up to 60 minutes. Begin the bowel prep again. Call your health care provider if you keep vomiting or you cannot take the bowel prep without vomiting. To clean out your colon, you may also be given: Laxative medicines. These help you have a bowel movement. Instructions for enema use. An enema is liquid medicine injected into your rectum. Medicines Ask your health care provider about: Changing or stopping your regular medicines or supplements. This is especially important if you are taking iron supplements, diabetes medicines, or blood thinners. Taking medicines such as aspirin and ibuprofen. These medicines can thin your blood. Do not take these medicines unless your health care provider tells you to take them. Taking over-the-counter medicines, vitamins, herbs, and supplements. General instructions Ask your health care provider what steps will be taken to help prevent infection. These may include  washing skin with a germ-killing soap. If you will be going home right after the procedure, plan to have a responsible adult: Take you home from the hospital or clinic. You will not be allowed to drive. Care for you for the time you are told. What happens during the procedure?  An IV will be inserted into one of your veins. You will be given a medicine to make you fall asleep (general anesthetic). You will lie on your side with your knees bent. A lubricant will be put on the tube. Then the tube will be: Inserted into your anus. Gently eased through all parts of your large intestine. Air will be sent into your colon to keep it open. This may cause some pressure or cramping. Images will be taken with the camera and will appear on a screen. A small tissue sample may be removed to be looked at under a microscope (biopsy). The tissue may be sent to a lab for testing if any signs of problems are found. If small polyps are found, they may be removed and checked for cancer cells. When the procedure is finished, the tube will be removed.  The procedure may vary among health care providers and hospitals. What happens after the procedure? Your blood pressure, heart rate, breathing rate, and blood oxygen level will be monitored until you leave the hospital or clinic. You may have a small amount of blood in your stool. You may pass gas and have mild cramping or bloating in your abdomen. This is caused by the air that was used to open your colon during the exam. If you were given a sedative during the procedure, it can affect you for several hours. Do not drive or operate machinery until your health care provider says that it is safe. It is up to you to get the results of your procedure. Ask your health care provider, or the department that is doing the procedure, when your results will be ready. Summary A colonoscopy is a procedure to look at the entire large intestine. Follow instructions from your  health care provider about eating and drinking before the procedure. If you were prescribed an oral bowel prep to clean out your colon, take it as told by your health care provider. During the colonoscopy, a flexible tube with a camera on its end is inserted into the anus and then passed into all parts of the large intestine. This information is not intended to replace advice given to you by your health care provider. Make sure you discuss any questions you have with your health care provider. Document Revised: 07/03/2022 Document Reviewed: 01/11/2021 Elsevier Patient Education  2024 [employer]  Upper Endoscopy, Adult Upper endoscopy is a procedure to look inside the upper GI (gastrointestinal) tract. The upper GI tract is made up of: The esophagus. This is the part of the body that moves food from your mouth to your stomach. The stomach. The duodenum. This is the first part of your small intestine. This procedure is also called esophagogastroduodenoscopy (EGD) or gastroscopy. In this procedure, your health care provider passes a thin, flexible tube (endoscope) through your mouth and down your esophagus into your stomach and into your duodenum. A small camera is attached to the end of the tube. Images from the camera appear on a monitor in the exam room. During this procedure, your health care provider may also remove a small piece of tissue to be sent to a lab and examined under a microscope (biopsy). Your health care provider may do an upper endoscopy to diagnose cancers of the upper GI tract. You may also have this procedure to find the cause of other conditions, such as: Stomach pain. Heartburn. Pain or problems when swallowing. Nausea and vomiting. Stomach bleeding. Stomach ulcers. Tell a health care provider about: Any allergies you have. All medicines you are taking, including vitamins, herbs, eye drops, creams, and over-the-counter medicines. Any problems you or family members have  had with anesthetic medicines. Any bleeding problems you have. Any surgeries you have had. Any medical conditions you have. Whether you are pregnant or may be pregnant. What are the risks? Your healthcare provider will talk with you about risks. These may include: Infection. Bleeding. Allergic reactions to medicines. A tear or hole (perforation) in the esophagus, stomach, or duodenum. What happens before the procedure? When to stop eating and drinking Follow instructions from your health care provider about what you may eat and drink. These may include: 8 hours before your procedure Stop eating most foods. Do not eat meat, fried foods, or fatty foods. Eat only light foods, such as toast or crackers. All liquids are okay except energy  drinks and alcohol. 6 hours before your procedure Stop eating. Drink only clear liquids, such as water, clear fruit juice, black coffee, plain tea, and sports drinks. Do not drink energy drinks or alcohol. 2 hours before your procedure Stop drinking all liquids. You may be allowed to take medicines with small sips of water. If you do not follow your health care provider's instructions, your procedure may be delayed or canceled. Medicines Ask your health care provider about: Changing or stopping your regular medicines. This is especially important if you are taking diabetes medicines or blood thinners. Taking medicines such as aspirin and ibuprofen. These medicines can thin your blood. Do not take these medicines unless your health care provider tells you to take them. Taking over-the-counter medicines, vitamins, herbs, and supplements. General instructions If you will be going home right after the procedure, plan to have a responsible adult: Take you home from the hospital or clinic. You will not be allowed to drive. Care for you for the time you are told. What happens during the procedure?  An IV will be inserted into one of your veins. You may be  given one or more of the following: A medicine to help you relax (sedative). A medicine to numb the throat (local anesthetic). You will lie on your left side on an exam table. Your health care provider will pass the endoscope through your mouth and down your esophagus. Your health care provider will use the scope to check the inside of your esophagus, stomach, and duodenum. Biopsies may be taken. The endoscope will be removed. The procedure may vary among health care providers and hospitals. What happens after the procedure? Your blood pressure, heart rate, breathing rate, and blood oxygen level will be monitored until you leave the hospital or clinic. When your throat is no longer numb, you may be given some fluids to drink. If you were given a sedative during the procedure, it can affect you for several hours. Do not drive or operate machinery until your health care provider says that it is safe. It is up to you to get the results of your procedure. Ask your health care provider, or the department that is doing the procedure, when your results will be ready. Contact a health care provider if you: Have a sore throat that lasts longer than 1 day. Have a fever. Get help right away if you: Vomit blood or your vomit looks like coffee grounds. Have bloody, black, or tarry stools. Have a very bad sore throat or you cannot swallow. Have difficulty breathing or very bad pain in your chest or abdomen. These symptoms may be an emergency. Get help right away. Call 911. Do not wait to see if the symptoms will go away. Do not drive yourself to the hospital. Summary Upper endoscopy is a procedure to look inside the upper GI tract. During the procedure, an IV will be inserted into one of your veins. You may be given a medicine to help you relax. The endoscope will be passed through your mouth and down your esophagus. Follow instructions from your health care provider about what you can eat and  drink. This information is not intended to replace advice given to you by your health care provider. Make sure you discuss any questions you have with your health care provider. Document Revised: 08/30/2021 Document Reviewed: 08/30/2021 Elsevier Patient Education  2024 [employer]  Monitored Anesthesia Care Anesthesia refers to the techniques, procedures, and medicines that help a person stay safe  and comfortable during surgery. Monitored anesthesia care, or sedation, is one type of anesthesia. You may have sedation if you do not need to be asleep for your procedure. Procedures that use sedation may include: Surgery to remove cataracts from your eyes. A dental procedure. A biopsy. This is when a tissue sample is removed and looked at under a microscope. You will be watched closely during your procedure. Your level of sedation or type of anesthesia may be changed to fit your needs. Tell a health care provider about: Any allergies you have. All medicines you are taking, including vitamins, herbs, eye drops, creams, and over-the-counter medicines. Any problems you or family members have had with anesthesia. Any bleeding problems you have. Any surgeries you have had. Any medical conditions or illnesses you have. This includes sleep apnea, cough, fever, or the flu. Whether you are pregnant or may be pregnant. Whether you use cigarettes, alcohol, or drugs. Any use of steroids, whether by mouth or as a cream. What are the risks? Your health care provider will talk with you about risks. These may include: Getting too much medicine (oversedation). Nausea. Allergic reactions to medicines. Trouble breathing. If this happens, a breathing tube may be used to help you breathe. It will be removed when you are awake and breathing on your own. Heart trouble. Lung trouble. Confusion that gets better with time (emergence delirium). What happens before the procedure? When to stop eating and  drinking Follow instructions from your health care provider about what you may eat and drink. These may include: 8 hours before your procedure Stop eating most foods. Do not eat meat, fried foods, or fatty foods. Eat only light foods, such as toast or crackers. All liquids are okay except energy drinks and alcohol. 6 hours before your procedure Stop eating. Drink only clear liquids, such as water, clear fruit juice, black coffee, plain tea, and sports drinks. Do not drink energy drinks or alcohol. 2 hours before your procedure Stop drinking all liquids. You may be allowed to take medicines with small sips of water. If you do not follow your health care provider's instructions, your procedure may be delayed or canceled. Medicines Ask your health care provider about: Changing or stopping your regular medicines. These include any diabetes medicines or blood thinners you take. Taking medicines such as aspirin and ibuprofen. These medicines can thin your blood. Do not take them unless your health care provider tells you to. Taking over-the-counter medicines, vitamins, herbs, and supplements. Testing You may have an exam or testing. You may have a blood or urine sample taken. General instructions Do not use any products that contain nicotine or tobacco for at least 4 weeks before the procedure. These products include cigarettes, chewing tobacco, and vaping devices, such as e-cigarettes. If you need help quitting, ask your health care provider. If you will be going home right after the procedure, plan to have a responsible adult: Take you home from the hospital or clinic. You will not be allowed to drive. Care for you for the time you are told. What happens during the procedure?  Your blood pressure, heart rate, breathing, level of pain, and blood oxygen level will be monitored. An IV will be inserted into one of your veins. You may be given: A sedative. This helps you relax. Anesthesia.  This will: Numb certain areas of your body. Make you fall asleep for surgery. You will be given medicines as needed to keep you comfortable. The more medicine you are given,  the deeper your level of sedation will be. Your level of sedation may be changed to fit your needs. There are three levels of sedation: Mild sedation. At this level, you may feel awake and relaxed. You will be able to follow directions. Moderate sedation. At this level, you will be sleepy. You may not remember the procedure. Deep sedation. At this level, you will be asleep. You will not remember the procedure. How you get the medicines will depend on your age and the procedure. They may be given as: A pill. This may be taken by mouth (orally) or inserted into the rectum. An injection. This may be into a vein or muscle. A spray through the nose. After your procedure is over, the medicine will be stopped. The procedure may vary among health care providers and hospitals. What happens after the procedure? Your blood pressure, heart rate, breathing rate, and blood oxygen level will be monitored until you leave the hospital or clinic. You may feel sleepy, clumsy, or nauseous. You may not remember what happened during or after the procedure. Sedation can affect you for several hours. Do not drive or use machinery until your health care provider says that it is safe. This information is not intended to replace advice given to you by your health care provider. Make sure you discuss any questions you have with your health care provider. Document Revised: 10/16/2021 Document Reviewed: 10/16/2021 Elsevier Patient Education  2024 [employer].

## 2023-07-18 ENCOUNTER — Encounter (HOSPITAL_COMMUNITY)
Admission: RE | Disposition: A | Discharge status: Home / Self Care | Payer: Self-pay | Source: Home / Self Care | Attending: Gastroenterology

## 2023-07-18 ENCOUNTER — Ambulatory Visit (HOSPITAL_BASED_OUTPATIENT_CLINIC_OR_DEPARTMENT_OTHER): Payer: Medicare HMO | Admitting: Anesthesiology

## 2023-07-18 ENCOUNTER — Ambulatory Visit (HOSPITAL_COMMUNITY): Payer: Self-pay | Admitting: Anesthesiology

## 2023-07-18 ENCOUNTER — Encounter (HOSPITAL_COMMUNITY): Payer: Self-pay | Admitting: Gastroenterology

## 2023-07-18 ENCOUNTER — Ambulatory Visit (HOSPITAL_COMMUNITY)
Admission: RE | Admit: 2023-07-18 | Discharge: 2023-07-18 | Disposition: A | Discharge status: Home / Self Care | Payer: Medicare HMO | Attending: Gastroenterology | Admitting: Gastroenterology

## 2023-07-18 ENCOUNTER — Other Ambulatory Visit: Payer: Self-pay

## 2023-07-18 ENCOUNTER — Encounter (INDEPENDENT_AMBULATORY_CARE_PROVIDER_SITE_OTHER): Payer: Self-pay | Admitting: *Deleted

## 2023-07-18 DIAGNOSIS — Z7984 Long term (current) use of oral hypoglycemic drugs: Secondary | ICD-10-CM | POA: Diagnosis not present

## 2023-07-18 DIAGNOSIS — Z7982 Long term (current) use of aspirin: Secondary | ICD-10-CM | POA: Insufficient documentation

## 2023-07-18 DIAGNOSIS — Z79899 Other long term (current) drug therapy: Secondary | ICD-10-CM | POA: Insufficient documentation

## 2023-07-18 DIAGNOSIS — K5730 Diverticulosis of large intestine without perforation or abscess without bleeding: Secondary | ICD-10-CM | POA: Insufficient documentation

## 2023-07-18 DIAGNOSIS — D649 Anemia, unspecified: Secondary | ICD-10-CM | POA: Diagnosis not present

## 2023-07-18 DIAGNOSIS — K279 Peptic ulcer, site unspecified, unspecified as acute or chronic, without hemorrhage or perforation: Secondary | ICD-10-CM | POA: Insufficient documentation

## 2023-07-18 DIAGNOSIS — D123 Benign neoplasm of transverse colon: Secondary | ICD-10-CM | POA: Diagnosis not present

## 2023-07-18 DIAGNOSIS — Z87891 Personal history of nicotine dependence: Secondary | ICD-10-CM | POA: Insufficient documentation

## 2023-07-18 DIAGNOSIS — K219 Gastro-esophageal reflux disease without esophagitis: Secondary | ICD-10-CM | POA: Diagnosis not present

## 2023-07-18 DIAGNOSIS — K648 Other hemorrhoids: Secondary | ICD-10-CM | POA: Insufficient documentation

## 2023-07-18 DIAGNOSIS — Z794 Long term (current) use of insulin: Secondary | ICD-10-CM | POA: Diagnosis not present

## 2023-07-18 DIAGNOSIS — I2510 Atherosclerotic heart disease of native coronary artery without angina pectoris: Secondary | ICD-10-CM

## 2023-07-18 DIAGNOSIS — E119 Type 2 diabetes mellitus without complications: Secondary | ICD-10-CM | POA: Insufficient documentation

## 2023-07-18 DIAGNOSIS — E785 Hyperlipidemia, unspecified: Secondary | ICD-10-CM | POA: Diagnosis not present

## 2023-07-18 DIAGNOSIS — K635 Polyp of colon: Secondary | ICD-10-CM

## 2023-07-18 DIAGNOSIS — Z8711 Personal history of peptic ulcer disease: Secondary | ICD-10-CM | POA: Insufficient documentation

## 2023-07-18 DIAGNOSIS — R1310 Dysphagia, unspecified: Secondary | ICD-10-CM

## 2023-07-18 DIAGNOSIS — I25119 Atherosclerotic heart disease of native coronary artery with unspecified angina pectoris: Secondary | ICD-10-CM | POA: Diagnosis not present

## 2023-07-18 DIAGNOSIS — K6389 Other specified diseases of intestine: Secondary | ICD-10-CM | POA: Insufficient documentation

## 2023-07-18 DIAGNOSIS — K3189 Other diseases of stomach and duodenum: Secondary | ICD-10-CM | POA: Insufficient documentation

## 2023-07-18 DIAGNOSIS — K921 Melena: Secondary | ICD-10-CM | POA: Diagnosis present

## 2023-07-18 DIAGNOSIS — Z1211 Encounter for screening for malignant neoplasm of colon: Secondary | ICD-10-CM

## 2023-07-18 DIAGNOSIS — M1990 Unspecified osteoarthritis, unspecified site: Secondary | ICD-10-CM | POA: Insufficient documentation

## 2023-07-18 DIAGNOSIS — K2970 Gastritis, unspecified, without bleeding: Secondary | ICD-10-CM

## 2023-07-18 DIAGNOSIS — K644 Residual hemorrhoidal skin tags: Secondary | ICD-10-CM | POA: Diagnosis not present

## 2023-07-18 DIAGNOSIS — I10 Essential (primary) hypertension: Secondary | ICD-10-CM | POA: Insufficient documentation

## 2023-07-18 HISTORY — PX: COLONOSCOPY WITH PROPOFOL: SHX5780

## 2023-07-18 HISTORY — PX: POLYPECTOMY: SHX5525

## 2023-07-18 HISTORY — PX: SAVORY DILATION: SHX5439

## 2023-07-18 HISTORY — PX: BIOPSY: SHX5522

## 2023-07-18 HISTORY — PX: ESOPHAGOGASTRODUODENOSCOPY (EGD) WITH PROPOFOL: SHX5813

## 2023-07-18 LAB — HM COLONOSCOPY

## 2023-07-18 LAB — GLUCOSE, CAPILLARY: Glucose-Capillary: 143 mg/dL — ABNORMAL HIGH (ref 70–99)

## 2023-07-18 SURGERY — COLONOSCOPY WITH PROPOFOL
Anesthesia: General

## 2023-07-18 MED ORDER — METOCLOPRAMIDE HCL 5 MG/ML IJ SOLN: 10.0000 mg | Freq: Once | INTRAMUSCULAR | Status: DC | PRN

## 2023-07-18 MED ORDER — LIDOCAINE 2% (20 MG/ML) 5 ML SYRINGE
INTRAMUSCULAR | Status: DC | PRN
  Administered 2023-07-18: 50 mg via INTRAVENOUS

## 2023-07-18 MED ORDER — MEPERIDINE HCL 50 MG/ML IJ SOLN
6.2500 mg | INTRAMUSCULAR | Status: DC | PRN
Start: 2023-07-18 — End: 2023-07-18

## 2023-07-18 MED ORDER — LACTATED RINGERS IV SOLN
INTRAVENOUS | Status: DC
Start: 2023-07-18 — End: 2023-07-18

## 2023-07-18 MED ORDER — PHENYLEPHRINE 80 MCG/ML (10ML) SYRINGE FOR IV PUSH (FOR BLOOD PRESSURE SUPPORT)
PREFILLED_SYRINGE | INTRAVENOUS | Status: DC | PRN
Start: 2023-07-18 — End: 2023-07-18
  Administered 2023-07-18 (×3): 80 ug via INTRAVENOUS

## 2023-07-18 MED ORDER — FENTANYL CITRATE (PF) 100 MCG/2ML IJ SOLN: 25.0000 ug | INTRAMUSCULAR | Status: DC | PRN

## 2023-07-18 MED ORDER — PROPOFOL 10 MG/ML IV BOLUS
INTRAVENOUS | Status: DC | PRN
  Administered 2023-07-18: 60 mg via INTRAVENOUS

## 2023-07-18 MED ORDER — PROPOFOL 500 MG/50ML IV EMUL
INTRAVENOUS | Status: DC | PRN
  Administered 2023-07-18: 175 ug/kg/min via INTRAVENOUS

## 2023-07-18 MED ORDER — LACTATED RINGERS IV SOLN: INTRAVENOUS | Status: DC | PRN

## 2023-07-18 NOTE — Transfer of Care (Signed)
Immediate Anesthesia Transfer of Care Note  Patient: Angel Dixon  Procedure(s) Performed: COLONOSCOPY WITH PROPOFOL ESOPHAGOGASTRODUODENOSCOPY (EGD) WITH PROPOFOL SAVORY DILATION BIOPSY POLYPECTOMY  Patient Location: PACU  Anesthesia Type:General  Level of Consciousness: sedated  Airway & Oxygen Therapy: Patient Spontanous Breathing  Post-op Assessment: Report given to RN and Post -op Vital signs reviewed and stable  Post vital signs: Reviewed and stable  Last Vitals:  Vitals Value Taken Time  BP    Temp    Pulse    Resp    SpO2      Last Pain:  Vitals:   07/18/23 1055  TempSrc:   PainSc: 0-No pain         Complications: No notable events documented.

## 2023-07-18 NOTE — Anesthesia Preprocedure Evaluation (Signed)
Anesthesia Evaluation  Patient identified by MRN, date of birth, ID band Patient awake    Reviewed: Allergy & Precautions, H&P , NPO status , Patient's Chart, lab work & pertinent test results  Airway Mallampati: III  TM Distance: >3 FB Neck ROM: Full    Dental  (+) Missing Missing most:   Pulmonary neg pulmonary ROS, former smoker   Pulmonary exam normal breath sounds clear to auscultation       Cardiovascular hypertension, + angina  + CAD  negative cardio ROS Normal cardiovascular exam+ dysrhythmias  Rhythm:Regular Rate:Normal     Neuro/Psych TIAnegative neurological ROS  negative psych ROS   GI/Hepatic negative GI ROS, Neg liver ROS, PUD,GERD  ,,  Endo/Other  negative endocrine ROSdiabetes    Renal/GU Renal diseasenegative Renal ROS  negative genitourinary   Musculoskeletal negative musculoskeletal ROS (+) Arthritis ,    Abdominal   Peds negative pediatric ROS (+)  Hematology negative hematology ROS (+)   Anesthesia Other Findings   Reproductive/Obstetrics negative OB ROS                             Anesthesia Physical Anesthesia Plan  ASA: 3  Anesthesia Plan: General   Post-op Pain Management: Minimal or no pain anticipated   Induction: Intravenous  PONV Risk Score and Plan: 1 and Propofol infusion  Airway Management Planned: Simple Face Mask and Nasal Cannula  Additional Equipment:   Intra-op Plan:   Post-operative Plan:   Informed Consent: I have reviewed the patients History and Physical, chart, labs and discussed the procedure including the risks, benefits and alternatives for the proposed anesthesia with the patient or authorized representative who has indicated his/her understanding and acceptance.       Plan Discussed with: CRNA  Anesthesia Plan Comments:        Anesthesia Quick Evaluation

## 2023-07-18 NOTE — Discharge Instructions (Signed)

## 2023-07-18 NOTE — Op Note (Signed)
[facility] Patient Name: Angel Dixon Procedure Date: 07/18/2023 10:48 AM MRN: [id number] Date of Birth: Oct 11, 1940 Attending MD: Sanjuan Dame , MD, [id number] CSN: [id number] Age: 83 Admit Type: Outpatient Procedure:                Upper GI endoscopy Indications:              Dysphagia, Melena Providers:                Sanjuan Dame, MD, Edrick Kins, RN, Lennice Sites                            Technician, Technician Referring MD:              Medicines:                Monitored Anesthesia Care Complications:            No immediate complications. Estimated Blood Loss:     Estimated blood loss: none. Procedure:                Pre-Anesthesia Assessment:                           - Prior to the procedure, a History and Physical                            was performed, and patient medications and                            allergies were reviewed. The patient's tolerance of                            previous anesthesia was also reviewed. The risks                            and benefits of the procedure and the sedation                            options and risks were discussed with the patient.                            All questions were answered, and informed consent                            was obtained. Prior Anticoagulants: The patient has                            taken no anticoagulant or antiplatelet agents                            except for aspirin. ASA Grade Assessment: III - A                            patient with severe systemic disease. After  reviewing the risks and benefits, the patient was                            deemed in satisfactory condition to undergo the                            procedure.                           After obtaining informed consent, the endoscope was                            passed under direct vision. Throughout the                            procedure, the patient's blood pressure, pulse,  and                            oxygen saturations were monitored continuously. The                            [id number]-[id number] ([id number] scope was introduced through the                            mouth, and advanced to the second part of duodenum.                            The upper GI endoscopy was accomplished without                            difficulty. The patient tolerated the procedure                            well. Scope In: 11:00:15 AM Scope Out: 11:04:32 AM Total Procedure Duration: 0 hours 4 minutes 17 seconds  Findings:      No endoscopic abnormality was evident in the esophagus to explain the       patient's complaint of dysphagia. It was decided, however, to proceed       with dilation of the entire esophagus. A guidewire was placed and the       scope was withdrawn. Dilation was performed with a Savary dilator with       no resistance at 15 mm. The dilation site was examined following       endoscope reinsertion and showed no change.      Mildly erythematous mucosa without bleeding was found in the gastric       antrum. Biopsies were taken with a cold forceps for histology.      The duodenal bulb and second portion of the duodenum were normal. Impression:               - No endoscopic esophageal abnormality to explain                            patient's dysphagia. Esophagus dilated. Dilated.                           -  Erythematous mucosa in the antrum. Biopsied.                           - Normal duodenal bulb and second portion of the                            duodenum. Moderate Sedation:      Per Anesthesia Care Recommendation:           - Patient has a contact number available for                            emergencies. The signs and symptoms of potential                            delayed complications were discussed with the                            patient. Return to normal activities tomorrow.                            Written discharge instructions were  provided to the                            patient.                           - Resume previous diet.                           - Continue present medications.                           - Await pathology results. Procedure Code(s):        --- Professional ---                           [phone], Esophagogastroduodenoscopy, flexible,                            transoral; with insertion of guide wire followed by                            passage of dilator(s) through esophagus over guide                            wire                           43239, 59, Esophagogastroduodenoscopy, flexible,                            transoral; with biopsy, single or multiple Diagnosis Code(s):        --- Professional ---                           R13.10, Dysphagia, unspecified  K31.89, Other diseases of stomach and duodenum                           K92.1, Melena (includes Hematochezia) CPT copyright 2022 American Medical Association. All rights reserved. The codes documented in this report are preliminary and upon coder review may  be revised to meet current compliance requirements. Sanjuan Dame, MD Sanjuan Dame, MD 07/18/2023 11:34:44 AM This report has been signed electronically. Number of Addenda: 0

## 2023-07-18 NOTE — Anesthesia Postprocedure Evaluation (Signed)
Anesthesia Post Note  Patient: Angel Dixon  Procedure(s) Performed: COLONOSCOPY WITH PROPOFOL ESOPHAGOGASTRODUODENOSCOPY (EGD) WITH PROPOFOL SAVORY DILATION BIOPSY POLYPECTOMY  Patient location during evaluation: PACU Anesthesia Type: General Level of consciousness: awake and alert Pain management: pain level controlled Vital Signs Assessment: post-procedure vital signs reviewed and stable Respiratory status: spontaneous breathing, nonlabored ventilation and respiratory function stable Cardiovascular status: blood pressure returned to baseline and stable Postop Assessment: no apparent nausea or vomiting Anesthetic complications: no   No notable events documented.   Last Vitals:  Vitals:   07/18/23 1138 07/18/23 1158  BP: (!) 92/42 123/80  Pulse: (!) 59   Resp: 12   Temp:    SpO2: 100%     Last Pain:  Vitals:   07/18/23 1138  TempSrc:   PainSc: 0-No pain                 Roslynn Amble

## 2023-07-18 NOTE — Op Note (Signed)
[facility] Patient Name: Angel Dixon Procedure Date: 07/18/2023 10:43 AM MRN: [id number] Date of Birth: [birth year] Attending MD: Sanjuan Dame , MD, [id number] CSN: [id number] Age: 83 Admit Type: Outpatient Procedure:                Colonoscopy Indications:              High risk colon cancer surveillance: Personal                            history of colonic polyps Providers:                Sanjuan Dame, MD, Edrick Kins, RN, Lennice Sites                            Technician, Technician Referring MD:              Medicines:                Monitored Anesthesia Care Complications:            No immediate complications. Estimated Blood Loss:     Estimated blood loss: none. Procedure:                Pre-Anesthesia Assessment:                           - Prior to the procedure, a History and Physical                            was performed, and patient medications and                            allergies were reviewed. The patient's tolerance of                            previous anesthesia was also reviewed. The risks                            and benefits of the procedure and the sedation                            options and risks were discussed with the patient.                            All questions were answered, and informed consent                            was obtained. Prior Anticoagulants: The patient has                            taken no anticoagulant or antiplatelet agents                            except for aspirin. ASA Grade Assessment: III - A  patient with severe systemic disease. After                            reviewing the risks and benefits, the patient was                            deemed in satisfactory condition to undergo the                            procedure.                           After obtaining informed consent, the colonoscope                            was passed under direct vision. Throughout the                             procedure, the patient's blood pressure, pulse, and                            oxygen saturations were monitored continuously. The                            [id number]) scope was introduced through the                            anus and advanced to the the cecum, identified by                            appendiceal orifice and ileocecal valve. The                            colonoscopy was performed without difficulty. The                            patient tolerated the procedure well. The quality                            of the bowel preparation was evaluated using the                            BBPS [facility] Bowel Preparation Scale) with scores                            of: Right Colon = 3, Transverse Colon = 3 and Left                            Colon = 3 (entire mucosa seen well with no residual                            staining, small fragments of stool or opaque  liquid). The total BBPS score equals 9. The                            ileocecal valve, appendiceal orifice, and rectum                            were photographed. Scope In: 11:08:44 AM Scope Out: 11:28:31 AM Scope Withdrawal Time: 0 hours 14 minutes 9 seconds  Total Procedure Duration: 0 hours 19 minutes 47 seconds  Findings:      The perianal and digital rectal examinations were normal.      A 4 mm polyp was found in the hepatic flexure. The polyp was sessile.       The polyp was removed with a cold snare. Resection and retrieval were       complete.      Scattered diverticula were found in the left colon.      Non-bleeding external and internal hemorrhoids were found during       retroflexion. The hemorrhoids were small.      An area of mild melanosis was found in the sigmoid colon. Impression:               - One 4 mm polyp at the hepatic flexure, removed                            with a cold snare. Resected and retrieved.                            - Diverticulosis in the left colon.                           - Non-bleeding external and internal hemorrhoids.                           - Melanosis in the colon. Moderate Sedation:      Per Anesthesia Care Recommendation:           - Patient has a contact number available for                            emergencies. The signs and symptoms of potential                            delayed complications were discussed with the                            patient. Return to normal activities tomorrow.                            Written discharge instructions were provided to the                            patient.                           - Resume previous diet.                           -  Continue present medications.                           - Await pathology results.                           - No repeat colonoscopy due to current age (39                            years or older). Procedure Code(s):        --- Professional ---                           [id number], Colonoscopy, flexible; with removal of                            tumor(s), polyp(s), or other lesion(s) by snare                            technique Diagnosis Code(s):        --- Professional ---                           Z86.010, Personal history of colonic polyps                           D12.3, Benign neoplasm of transverse colon (hepatic                            flexure or splenic flexure)                           K64.8, Other hemorrhoids                           K57.30, Diverticulosis of large intestine without                            perforation or abscess without bleeding CPT copyright 2022 American Medical Association. All rights reserved. The codes documented in this report are preliminary and upon coder review may  be revised to meet current compliance requirements. Sanjuan Dame, MD Sanjuan Dame, MD 07/18/2023 11:37:46 AM This report has been signed electronically. Number of Addenda: 0

## 2023-07-18 NOTE — Interval H&P Note (Signed)
History and Physical Interval Note:  07/18/2023 10:48 AM  Angel Dixon  has presented today for surgery, with the diagnosis of DYSPHAGIA, MELENA, HISTORY COLON POLYPS.  The various methods of treatment have been discussed with the patient and family. After consideration of risks, benefits and other options for treatment, the patient has consented to  Procedure(s) with comments: COLONOSCOPY WITH PROPOFOL (N/A) - 10:45AM; ASA 3 ESOPHAGOGASTRODUODENOSCOPY (EGD) WITH PROPOFOL (N/A) - 10:45AM;ASA 3 as a surgical intervention.  The patient's history has been reviewed, patient examined, no change in status, stable for surgery.  I have reviewed the patient's chart and labs.  Questions were answered to the patient's satisfaction.     [name]

## 2023-07-19 ENCOUNTER — Encounter (HOSPITAL_COMMUNITY): Payer: Self-pay | Admitting: Gastroenterology

## 2023-07-19 LAB — SURGICAL PATHOLOGY

## 2023-07-22 NOTE — Progress Notes (Signed)
 I reviewed the pathology results. [name], can you send her a letter with the findings as described below please?  Thanks,  Angel Lawman, MD Gastroenterology and Hepatology [facility]  ---------------------------------------------------------------------------------------------  [facility] [address], [address], [place], [place] [address] Phone:  [phone]   07/22/23 [name], [place]   Dear Angel Dixon,  I am writing to inform you that the biopsies taken during your recent endoscopic examination showed:  A. STOMACH, RANDOM, BIOPSY:       Gastric antral / oxyntic mucosa without significant diagnostic  alteration.      No H. pylori identified on HE stain.       Negative for intestinal metaplasia or dysplasia.   B. HEPATIC FLEXURE, POLYPECTOMY:       Tubular adenoma.       Negative for high-grade dysplasia.   What does this mean?  No evidence of bacteria in the stomach called H. pylori or any early cancer changes in the stomach.  This is good news  I am writing to let you know the results of your recent colonoscopy.  You had a total of 1 polyps removed. The pathology came back as "tubular adenoma." These findings are NOT cancer, but had the polyps remained in your colon, they could have turned into cancer.  Also I value your feedback , so if you get a survey , please take the time to fill it out and thank you for choosing [facility]  Please call us at [phone] if you have persistent problems or have questions about your condition that have not been fully answered at this time.  Sincerely,  Angel Lawman, MD Gastroenterology and Hepatology

## 2023-07-23 ENCOUNTER — Encounter (HOSPITAL_COMMUNITY): Payer: Self-pay

## 2023-07-23 ENCOUNTER — Emergency Department (HOSPITAL_COMMUNITY): Payer: Medicare HMO

## 2023-07-23 ENCOUNTER — Emergency Department (HOSPITAL_COMMUNITY)
Admission: EM | Admit: 2023-07-23 | Discharge: 2023-07-23 | Disposition: A | Discharge status: Home / Self Care | Payer: Medicare HMO | Attending: Emergency Medicine | Admitting: Emergency Medicine

## 2023-07-23 ENCOUNTER — Other Ambulatory Visit: Payer: Self-pay

## 2023-07-23 ENCOUNTER — Encounter (INDEPENDENT_AMBULATORY_CARE_PROVIDER_SITE_OTHER): Payer: Self-pay | Admitting: *Deleted

## 2023-07-23 DIAGNOSIS — I2510 Atherosclerotic heart disease of native coronary artery without angina pectoris: Secondary | ICD-10-CM | POA: Insufficient documentation

## 2023-07-23 DIAGNOSIS — Z7984 Long term (current) use of oral hypoglycemic drugs: Secondary | ICD-10-CM | POA: Diagnosis not present

## 2023-07-23 DIAGNOSIS — N189 Chronic kidney disease, unspecified: Secondary | ICD-10-CM | POA: Insufficient documentation

## 2023-07-23 DIAGNOSIS — R0789 Other chest pain: Secondary | ICD-10-CM | POA: Diagnosis present

## 2023-07-23 DIAGNOSIS — Z79899 Other long term (current) drug therapy: Secondary | ICD-10-CM | POA: Diagnosis not present

## 2023-07-23 DIAGNOSIS — Z794 Long term (current) use of insulin: Secondary | ICD-10-CM | POA: Insufficient documentation

## 2023-07-23 DIAGNOSIS — E1122 Type 2 diabetes mellitus with diabetic chronic kidney disease: Secondary | ICD-10-CM | POA: Diagnosis not present

## 2023-07-23 DIAGNOSIS — S0990XA Unspecified injury of head, initial encounter: Secondary | ICD-10-CM | POA: Insufficient documentation

## 2023-07-23 DIAGNOSIS — I129 Hypertensive chronic kidney disease with stage 1 through stage 4 chronic kidney disease, or unspecified chronic kidney disease: Secondary | ICD-10-CM | POA: Insufficient documentation

## 2023-07-23 DIAGNOSIS — Z7982 Long term (current) use of aspirin: Secondary | ICD-10-CM | POA: Diagnosis not present

## 2023-07-23 DIAGNOSIS — Z8673 Personal history of transient ischemic attack (TIA), and cerebral infarction without residual deficits: Secondary | ICD-10-CM | POA: Insufficient documentation

## 2023-07-23 DIAGNOSIS — Y92410 Unspecified street and highway as the place of occurrence of the external cause: Secondary | ICD-10-CM | POA: Insufficient documentation

## 2023-07-23 LAB — CBC
HCT: 39.7 % (ref 39.0–52.0)
Hemoglobin: 12.7 g/dL — ABNORMAL LOW (ref 13.0–17.0)
MCH: 27.6 pg (ref 26.0–34.0)
MCHC: 32 g/dL (ref 30.0–36.0)
MCV: 86.3 fL (ref 80.0–100.0)
Platelets: 273 10*3/uL (ref 150–400)
RBC: 4.6 MIL/uL (ref 4.22–5.81)
RDW: 12.6 % (ref 11.5–15.5)
WBC: 4.6 10*3/uL (ref 4.0–10.5)
nRBC: 0 % (ref 0.0–0.2)

## 2023-07-23 LAB — COMPREHENSIVE METABOLIC PANEL
ALT: 31 U/L (ref 0–44)
AST: 24 U/L (ref 15–41)
Albumin: 4.4 g/dL (ref 3.5–5.0)
Alkaline Phosphatase: 96 U/L (ref 38–126)
Anion gap: 9 (ref 5–15)
BUN: 12 mg/dL (ref 8–23)
CO2: 28 mmol/L (ref 22–32)
Calcium: 9.3 mg/dL (ref 8.9–10.3)
Chloride: 97 mmol/L — ABNORMAL LOW (ref 98–111)
Creatinine, Ser: 1.04 mg/dL (ref 0.61–1.24)
GFR, Estimated: >60 mL/min (ref >60)
Glucose, Bld: 242 mg/dL — ABNORMAL HIGH (ref 70–99)
Potassium: 4.6 mmol/L (ref 3.5–5.1)
Sodium: 134 mmol/L — ABNORMAL LOW (ref 135–145)
Total Bilirubin: 1.4 mg/dL — ABNORMAL HIGH (ref 0.0–1.2)
Total Protein: 8 g/dL (ref 6.5–8.1)

## 2023-07-23 MED ORDER — OXYCODONE-ACETAMINOPHEN 5-325 MG PO TABS
1.0000 | ORAL_TABLET | Freq: Once | ORAL | Status: AC
  Administered 2023-07-23: 1 via ORAL
  Filled 2023-07-23: qty 1

## 2023-07-23 MED ORDER — KETOROLAC TROMETHAMINE 15 MG/ML IJ SOLN: 15.0000 mg | Freq: Once | INTRAMUSCULAR | Status: DC

## 2023-07-23 MED ORDER — OXYCODONE-ACETAMINOPHEN 5-325 MG PO TABS: 1.0000 | ORAL_TABLET | Freq: Three times a day (TID) | ORAL | 0 refills | Status: AC | PRN
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
## 2023-08-18 ENCOUNTER — Encounter (HOSPITAL_COMMUNITY): Payer: Self-pay | Admitting: Emergency Medicine

## 2023-08-18 ENCOUNTER — Emergency Department (HOSPITAL_COMMUNITY)
Admission: EM | Admit: 2023-08-18 | Discharge: 2023-08-18 | Disposition: A | Discharge status: Home / Self Care | Attending: Emergency Medicine | Admitting: Emergency Medicine

## 2023-08-18 ENCOUNTER — Other Ambulatory Visit: Payer: Self-pay

## 2023-08-18 DIAGNOSIS — R300 Dysuria: Secondary | ICD-10-CM | POA: Diagnosis not present

## 2023-08-18 DIAGNOSIS — R739 Hyperglycemia, unspecified: Secondary | ICD-10-CM | POA: Insufficient documentation

## 2023-08-18 DIAGNOSIS — Z7982 Long term (current) use of aspirin: Secondary | ICD-10-CM | POA: Insufficient documentation

## 2023-08-18 DIAGNOSIS — D649 Anemia, unspecified: Secondary | ICD-10-CM | POA: Diagnosis not present

## 2023-08-18 DIAGNOSIS — R339 Retention of urine, unspecified: Secondary | ICD-10-CM | POA: Diagnosis present

## 2023-08-18 LAB — CBC WITH DIFFERENTIAL/PLATELET
Abs Immature Granulocytes: 0.01 10*3/uL (ref 0.00–0.07)
Basophils Absolute: 0 10*3/uL (ref 0.0–0.1)
Basophils Relative: 0 %
Eosinophils Absolute: 0.1 10*3/uL (ref 0.0–0.5)
Eosinophils Relative: 2 %
HCT: 36.1 % — ABNORMAL LOW (ref 39.0–52.0)
Hemoglobin: 12 g/dL — ABNORMAL LOW (ref 13.0–17.0)
Immature Granulocytes: 0 %
Lymphocytes Relative: 17 %
Lymphs Abs: 0.8 10*3/uL (ref 0.7–4.0)
MCH: 28.4 pg (ref 26.0–34.0)
MCHC: 33.2 g/dL (ref 30.0–36.0)
MCV: 85.3 fL (ref 80.0–100.0)
Monocytes Absolute: 0.5 10*3/uL (ref 0.1–1.0)
Monocytes Relative: 11 %
Neutro Abs: 3.1 10*3/uL (ref 1.7–7.7)
Neutrophils Relative %: 70 %
Platelets: 265 10*3/uL (ref 150–400)
RBC: 4.23 MIL/uL (ref 4.22–5.81)
RDW: 12.2 % (ref 11.5–15.5)
WBC: 4.6 10*3/uL (ref 4.0–10.5)
nRBC: 0 % (ref 0.0–0.2)

## 2023-08-18 LAB — URINALYSIS, ROUTINE W REFLEX MICROSCOPIC
Bacteria, UA: NONE SEEN
Bilirubin Urine: NEGATIVE
Glucose, UA: >=500 mg/dL — AB
Hgb urine dipstick: NEGATIVE
Ketones, ur: NEGATIVE mg/dL
Leukocytes,Ua: NEGATIVE
Nitrite: NEGATIVE
Protein, ur: NEGATIVE mg/dL
Specific Gravity, Urine: 1.001 — ABNORMAL LOW (ref 1.005–1.030)
pH: 6 (ref 5.0–8.0)

## 2023-08-18 LAB — BASIC METABOLIC PANEL
Anion gap: 8 (ref 5–15)
BUN: 12 mg/dL (ref 8–23)
CO2: 29 mmol/L (ref 22–32)
Calcium: 9.1 mg/dL (ref 8.9–10.3)
Chloride: 99 mmol/L (ref 98–111)
Creatinine, Ser: 1.06 mg/dL (ref 0.61–1.24)
GFR, Estimated: >60 mL/min (ref >60)
Glucose, Bld: 229 mg/dL — ABNORMAL HIGH (ref 70–99)
Potassium: 4.1 mmol/L (ref 3.5–5.1)
Sodium: 136 mmol/L (ref 135–145)

## 2023-08-18 NOTE — ED Notes (Signed)
 See triage notes. Lower abd swelling noted. A/o. NT in for bladder scanner.

## 2023-08-18 NOTE — ED Provider Notes (Signed)
 [place] EMERGENCY DEPARTMENT AT [facility] Provider Note   CSN: [id number] Arrival date & time: 08/18/23  1010     History  Chief Complaint  Patient presents with   Urinary Retention    Angel Dixon is a 83 y.o. male.  Patient is a 83 year old male who presents to the emergency department with a chief complaint of inability to urinate, lower abdominal pain which has been ongoing since yesterday.  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  He notes that he is on medications for this.  He denies any associated fever, chills, chest pain, shortness of breath.  He denies any nausea, vomiting, diarrhea or constipation.  He notes that he has been having some intermittent dysuria over the past few days.  He notes that he is followed by a urologist.        Home Medications Prior to Admission medications   Medication Sig Start Date End Date Taking? Authorizing Provider  acetaminophen (TYLENOL) 500 MG tablet Take 1 tablet (500 mg total) by mouth every 6 (six) hours as needed. Patient taking differently: Take 500 mg by mouth every 6 (six) hours as needed for mild pain (pain score 1-3). 01/26/23   Fayrene Helper, PA-C  amiodarone (PACERONE) 200 MG tablet Take 1 tablet (200 mg total) by mouth daily. 11/06/22   Shon Hale, MD  aspirin EC 81 MG tablet Take 1 tablet (81 mg total) by mouth daily with breakfast. Swallow whole. 11/06/22   Shon Hale, MD  atorvastatin (LIPITOR) 40 MG tablet Take 1 tablet (40 mg total) by mouth daily. 11/06/22   Shon Hale, MD  cetirizine (ZYRTEC) 10 MG tablet Take 10 mg by mouth daily as needed for allergies.    Medicine, [facility]  dapagliflozin propanediol (FARXIGA) 5 MG TABS tablet Take 1 tablet (5 mg total) by mouth daily. 11/06/22   Shon Hale, MD  finasteride (PROSCAR) 5 MG tablet Take 1 tablet (5 mg total) by mouth daily. 03/13/23   McKenzie, Mardene Celeste, MD  fluticasone (FLONASE) 50 MCG/ACT nasal spray Place 1 spray into both  nostrils as needed for allergies or rhinitis.  09/20/14   [provider]  glipiZIDE (GLUCOTROL XL) 5 MG 24 hr tablet Take 10 mg by mouth daily. 12/16/22   [provider]  isosorbide mononitrate (IMDUR) 30 MG 24 hr tablet Take 15 mg by mouth daily. 02/10/23   [provider]  meclizine (ANTIVERT) 25 MG tablet Take 25 mg by mouth 3 (three) times daily as needed for dizziness. 11/27/22   [provider]  metoprolol tartrate (LOPRESSOR) 25 MG tablet Take 1 tablet (25 mg total) by mouth 2 (two) times daily. Patient taking differently: Take 25 mg by mouth daily. 11/06/22   Shon Hale, MD  nitroGLYCERIN (NITROSTAT) 0.4 MG SL tablet Place 1 tablet (0.4 mg total) under the tongue every 5 (five) minutes as needed for chest pain (severe chest pain or pressure only). 08/12/20 05/19/29  Hilty, Lisette Abu, MD  NOVOLIN N 100 UNIT/ML injection Inject 10 Units into the skin 2 (two) times daily as needed (If Blood Sugar gets above 100). 10/17/22   [provider]  ONETOUCH ULTRA test strip USE 1 STRIP TO CHECK BLOOD GLUCOSE LEVEL TWICE DAILY. 11/22/20   [provider]  pantoprazole (PROTONIX) 40 MG tablet Take 1 tablet (40 mg total) by mouth 2 (two) times daily. 11/06/22   Shon Hale, MD  Polyethylene Glycol 400 (BLINK TEARS OP) Place 1 drop into  both eyes 2 (two) times daily.    [provider]  silodosin (RAPAFLO) 8 MG CAPS capsule Take 1 capsule (8 mg total) by mouth at bedtime. 03/13/23   McKenzie, Mardene Celeste, MD  trospium (SANCTURA) 20 MG tablet Take 1 tablet (20 mg total) by mouth 2 (two) times daily. 05/10/23   McKenzie, Mardene Celeste, MD  Vibegron (GEMTESA) 75 MG TABS Take 1 tablet (75 mg total) by mouth daily. 05/20/23   McKenzie, Mardene Celeste, MD      Allergies    Lisinopril, Sulfa antibiotics, Sulfacetamide, Vioxx [rofecoxib], and Yellow jacket venom [bee venom]    Review of Systems   Review of Systems  Gastrointestinal:  Positive for abdominal  distention.  Genitourinary:  Positive for decreased urine volume.  All other systems reviewed and are negative.   Physical Exam Updated Vital Signs BP (!) 171/97 (BP Location: Right Arm)   Pulse (!) 104   Temp 98.4 F (36.9 C) (Oral)   Resp 18   Ht 5\' 8"  (1.727 m)   Wt 64.9 kg   SpO2 99%   BMI 21.74 kg/m  Physical Exam Vitals and nursing note reviewed.  Constitutional:      Appearance: Normal appearance.  HENT:     Head: Normocephalic and atraumatic.  Eyes:     Extraocular Movements: Extraocular movements intact.     Conjunctiva/sclera: Conjunctivae normal.     Pupils: Pupils are equal, round, and reactive to light.  Cardiovascular:     Rate and Rhythm: Normal rate and regular rhythm.     Pulses: Normal pulses.     Heart sounds: Normal heart sounds.  Pulmonary:     Effort: Pulmonary effort is normal. No respiratory distress.     Breath sounds: Normal breath sounds. No stridor. No wheezing, rhonchi or rales.  Abdominal:     General: Abdomen is flat. Bowel sounds are normal.     Palpations: Abdomen is soft.     Comments: Tenderness palpation over suprapubic region  Musculoskeletal:        General: Normal range of motion.     Cervical back: Normal range of motion and neck supple.  Skin:    General: Skin is warm and dry.  Neurological:     General: No focal deficit present.     Mental Status: He is alert and oriented to person, place, and time. Mental status is at baseline.  Psychiatric:        Mood and Affect: Mood normal.        Behavior: Behavior normal.        Thought Content: Thought content normal.        Judgment: Judgment normal.     ED Results / Procedures / Treatments   Labs (all labs ordered are listed, but only abnormal results are displayed) Labs Reviewed  URINALYSIS, ROUTINE W REFLEX MICROSCOPIC  BASIC METABOLIC PANEL  CBC WITH DIFFERENTIAL/PLATELET    EKG None  Radiology No results found.  Procedures Procedures    Medications Ordered  in ED Medications - No data to display  ED Course/ Medical Decision Making/ A&P                                 Medical Decision Making Amount and/or Complexity of Data Reviewed Labs: ordered.   This patient presents to the ED for concern of urinary retention differential diagnosis includes BPH, urinary tract infection, ureteral or bladder stone, cauda  equina syndrome, vertebral osteomyelitis, epidural abscess    Additional history obtained:  Additional history obtained from daughter External records from outside source obtained and reviewed including none   Lab Tests:  I Ordered, and personally interpreted labs.  The pertinent results include: Anemia, normal creatinine, hyperglycemia   Problem List / ED Course:  Patient is doing much better at this time and is stable for discharge home.  Symptoms have completely resolved after Foley catheter placement in the emergency department.  Patient has no active abdominal pain, back pain and no concerning neurological deficits to include saddle paresthesias or bowel incontinence.  Do not suspect cauda equina syndrome, vertebral osteomyelitis, epidural abscess.  He does have a history of severe BPH and suspect that this is the cause of his urinary outflow obstruction.  Will leave Foley catheter in place and he will follow-up with his urologist on an outpatient basis.  No signs of urinary tract infection were noted and kidney function was at baseline.  Strict turn precautions were discussed for any new or worsening symptoms.  Patient voiced understanding and had no additional questions.   Social Determinants of Health:  None           Final Clinical Impression(s) / ED Diagnoses Final diagnoses:  None    Rx / DC Orders ED Discharge Orders     None         Kathlen Mody 08/18/23 1211    Bethann Berkshire, MD 08/19/23 [phone]

## 2023-08-18 NOTE — ED Triage Notes (Signed)
 Pt bib pov w/ c/o urinary retention. Pt takes medication for his prostate. He reports significant pain and no urination since yesterday at 3 pm.

## 2023-08-18 NOTE — ED Notes (Signed)
 Leg bag applied and instructed pt on use, pt verbalized understanding.

## 2023-08-18 NOTE — Discharge Instructions (Addendum)
 Please follow-up closely with Dr. Ronne Binning on an outpatient basis.  Please call to make an appointment tomorrow.  Return to the emergency department immediately for any new or worsening symptoms.

## 2023-08-19 ENCOUNTER — Telehealth: Payer: Self-pay | Admitting: Urology

## 2023-08-19 NOTE — Telephone Encounter (Signed)
 Please let patient know bed bag is at the front for pick up.  Thanks!

## 2023-08-19 NOTE — Telephone Encounter (Signed)
 Patient states he was seen in the ER and had catheter placed.  He was not started on any medication.  Will patient need to be started on anything before his appt on 03/26?

## 2023-08-19 NOTE — Telephone Encounter (Signed)
 Patient called he was in ER for not being able to urinate,  he said that the bag that they put on is too small and wants to know if he can stop by and pick up a bigger bag?

## 2023-08-20 NOTE — Telephone Encounter (Signed)
 Patient is aware, he will start Rapaflo rx, he did not have the finasteride.  He was advised to contact his pharmacy for refills.  Patient voiced understanding.

## 2023-08-27 ENCOUNTER — Ambulatory Visit: Payer: Medicare HMO | Admitting: Internal Medicine

## 2023-08-28 ENCOUNTER — Ambulatory Visit (INDEPENDENT_AMBULATORY_CARE_PROVIDER_SITE_OTHER): Payer: Medicare HMO | Admitting: Gastroenterology

## 2023-08-28 ENCOUNTER — Encounter (INDEPENDENT_AMBULATORY_CARE_PROVIDER_SITE_OTHER): Payer: Self-pay | Admitting: Gastroenterology

## 2023-08-28 ENCOUNTER — Telehealth: Payer: Self-pay | Admitting: *Deleted

## 2023-08-28 ENCOUNTER — Ambulatory Visit: Admitting: Urology

## 2023-08-28 VITALS — BP 172/93 | HR 71 | Temp 97.5°F | Ht 68.0 in | Wt 136.5 lb

## 2023-08-28 VITALS — BP 148/87 | HR 85

## 2023-08-28 DIAGNOSIS — R351 Nocturia: Secondary | ICD-10-CM | POA: Diagnosis not present

## 2023-08-28 DIAGNOSIS — R1310 Dysphagia, unspecified: Secondary | ICD-10-CM | POA: Diagnosis not present

## 2023-08-28 DIAGNOSIS — R350 Frequency of micturition: Secondary | ICD-10-CM | POA: Diagnosis not present

## 2023-08-28 DIAGNOSIS — Z860100 Personal history of colon polyps, unspecified: Secondary | ICD-10-CM

## 2023-08-28 DIAGNOSIS — R1319 Other dysphagia: Secondary | ICD-10-CM

## 2023-08-28 DIAGNOSIS — N401 Enlarged prostate with lower urinary tract symptoms: Secondary | ICD-10-CM | POA: Diagnosis not present

## 2023-08-28 DIAGNOSIS — N138 Other obstructive and reflux uropathy: Secondary | ICD-10-CM

## 2023-08-28 DIAGNOSIS — D649 Anemia, unspecified: Secondary | ICD-10-CM

## 2023-08-28 DIAGNOSIS — R634 Abnormal weight loss: Secondary | ICD-10-CM | POA: Insufficient documentation

## 2023-08-28 MED ORDER — SILODOSIN 8 MG PO CAPS: 8.0000 mg | ORAL_CAPSULE | Freq: Every day | ORAL | 3 refills | Status: DC

## 2023-08-28 MED ORDER — FINASTERIDE 5 MG PO TABS: 5.0000 mg | ORAL_TABLET | Freq: Every day | ORAL | 3 refills | Status: DC

## 2023-08-28 MED ORDER — CIPROFLOXACIN HCL 500 MG PO TABS
500.0000 mg | ORAL_TABLET | Freq: Once | ORAL | Status: AC
  Administered 2023-08-28: 500 mg via ORAL

## 2023-08-28 MED ORDER — TROSPIUM CHLORIDE 20 MG PO TABS: 20.0000 mg | ORAL_TABLET | Freq: Two times a day (BID) | ORAL | 11 refills | Status: DC

## 2023-08-28 NOTE — Telephone Encounter (Signed)
 Called and gave pt/spouse CT appointment details. They voiced understanding

## 2023-08-28 NOTE — Patient Instructions (Signed)
 It was very nice to meet you today, as dicussed with will plan for the following :  1) CT Abdomen and pelvis

## 2023-08-28 NOTE — Telephone Encounter (Signed)
 CT CHEST/ABD/PELVIS scheduled for 5/5, arrival 4:30pm, [facility]   Called pt, no answer. Will call back

## 2023-08-28 NOTE — Progress Notes (Signed)
 Angel Dixon , M.D. Gastroenterology & Hepatology [facility] Gastroenterology [address] [place], [place] [address] Primary Care Physician: Angel Stabile, MD [address] [place] [address]  Chief Complaint: Dysphagia, normocytic anemia, unintentional weight loss   History of Present Illness: Angel Dixon is a 83 y.o. male with PMH DM, CAD, HTN, GERD, HLD, who presents for follow up of  normocytic anemia, and intentional weight loss  Patient recently underwent upper endoscopy and colonoscopy without any overt malignancy or bleeding.  He had esophageal dilation as well and removal of 1 colon polyp   Patient today reports he is concerned about continued weight loss despite having good appetite.  Last month his weight was 141 and today 136 and his last 5 pounds in a month..  Patient reports dysphagia is slightly better but continues to have intermittent solid food slowing down in the throat area.  No further black-colored stools which patient was reporting previously.The patient denies having any nausea, vomiting, fever, chills, hematochezia, hematemesis, abdominal distention, abdominal pain, diarrhea, jaundice, pruritus   Last Colonoscopy/EGD  EGD 07/2023 - No endoscopic esophageal abnormality to explain patient' s dysphagia. Esophagus dilated. Dilated. - Erythematous mucosa in the antrum. Biopsied.:Normal duodenum   Colonoscopy 07/2023 - One 4 mm polyp at the hepatic flexure, removed with a cold snare. Resected and retrieved. - Diverticulosis in the left colon. - Non- bleeding external and internal hemorrhoids. - Melanosis in the colon.  A. STOMACH, RANDOM, BIOPSY:       Gastric antral / oxyntic mucosa without significant diagnostic  alteration.      No H. pylori identified on HE stain.       Negative for intestinal metaplasia or dysplasia.    B. HEPATIC FLEXURE, POLYPECTOMY:       Tubular adenoma.       Negative for high-grade  dysplasia.    2018 - Melanosis in the colon.                           - One 8 mm polyp in the ascending colon, removed                            with a hot snare. Resected and retrieved. Clip (MR                            conditional) was placed. (TA)                           - Diverticulosis in the sigmoid colon.                           - External hemorrhoids.   Last Endoscopy: 15 years ago  FHx: neg for any gastrointestinal/liver disease, father had cancer unknown primary Social: former smoking but quit 40 years ago, alcohol or illicit drug use Surgical: inguinal hernia  Past Medical History: Past Medical History:  Diagnosis Date   Angina    CAD (coronary artery disease)    moderate, by cath   Diabetes mellitus    DJD (degenerative joint disease)    lumbar lam 4/05   Dyslipidemia    Dysrhythmia    "palpitation", PVCs   GERD (gastroesophageal reflux disease)    Hypertension  Palpitations    PUD (peptic ulcer disease)    endo 03/15/10    Past Surgical History: Past Surgical History:  Procedure Laterality Date   BACK SURGERY  2005   BIOPSY  07/18/2023   Procedure: BIOPSY;  Surgeon: Angel Macho, MD;  [facility]: [facility];  Service: Endoscopy;;   CARDIAC CATHETERIZATION  02/22/2006   mild obs. CAD 60-70% narrowing in small inferior branch of 1st diagonal of LAD, smooth 10% narrowing of mid RCA (Dr. Bishop Dixon)   CARDIAC CATHETERIZATION  04/27/2008   small lateral  myocardial infarction from inferior bifurcation branch DX (Dr. Jonette Dixon)   CARDIAC CATHETERIZATION  05/11/2011   nonobstructive CAD (Dr. Ranae Dixon)   COLONOSCOPY N/A 06/13/2016   Procedure: COLONOSCOPY;  Surgeon: Angel Hippo, MD;  Location: [facility];  Service: Endoscopy;  Laterality: N/A;  1200   COLONOSCOPY WITH PROPOFOL N/A 07/18/2023   Procedure: COLONOSCOPY WITH PROPOFOL;  Surgeon: Angel Macho, MD;  [facility]: [facility];  Service: Endoscopy;  Laterality: N/A;  10:45AM; ASA  3   ESOPHAGOGASTRODUODENOSCOPY (EGD) WITH PROPOFOL N/A 07/18/2023   Procedure: ESOPHAGOGASTRODUODENOSCOPY (EGD) WITH PROPOFOL;  Surgeon: Angel Macho, MD;  [facility]: [facility];  Service: Endoscopy;  Laterality: N/A;  10:45AM;ASA 3   HERNIA REPAIR     INGUINAL HERNIA REPAIR  1982   right   LEFT HEART CATHETERIZATION WITH CORONARY ANGIOGRAM N/A 05/14/2011   Procedure: LEFT HEART CATHETERIZATION WITH CORONARY ANGIOGRAM;  Surgeon: Angel Lex, MD;  Location: [facility] CATH LAB;  Service: Cardiovascular;  Laterality: N/A;  Coronary angiogram and possible PCI   LUMBAR LAMINECTOMY  09/2003   L2-3, 3-4, 4-5   NM MYOCAR PERF WALL MOTION  10/03/2009   bruce myoview - normal perfusion in all regions, EF 70%, no ischemia, low risk   POLYPECTOMY  07/18/2023   Procedure: POLYPECTOMY;  Surgeon: Angel Macho, MD;  [facility];  Service: Endoscopy;;   SAVORY DILATION  07/18/2023   Procedure: SAVORY DILATION;  Surgeon: Angel Macho, MD;  [facility];  Service: Endoscopy;;   TRANSTHORACIC ECHOCARDIOGRAM  03/28/2012   EF 65-70%, grade 1 diastolic dysfunction, mod AV calcification, mild MR   XI ROBOTIC ASSISTED INGUINAL HERNIA REPAIR WITH MESH Left 12/20/2022   Procedure: XI ROBOTIC ASSISTED INGUINAL HERNIA REPAIR WITH MESH;  Surgeon: Angel Macho, MD;  Location: [facility];  Service: General;  Laterality: Left;    Family History: Family History  Problem Relation Age of Onset   Kidney failure Mother        died 18   Coronary artery disease Father        died 73   Heart attack Sister        died of MI in her 36s   Sarcoidosis Daughter        died 21    Social History: Social History   Tobacco Use  Smoking Status Former   Current packs/day: 1.00   Average packs/day: 1 pack/day for 25.0 years (25.0 ttl pk-yrs)   Types: Cigarettes   Passive exposure: Past  Smokeless Tobacco Never  Tobacco Comments   quit smoking cigarettes ~ 1990   Social History   Substance and  Sexual Activity  Alcohol Use Not Currently   Comment: very seldom   Social History   Substance and Sexual Activity  Drug Use No    Allergies: Allergies  Allergen Reactions   Lisinopril Other (See Comments)    Sore throat, no  obvious angioedema or cough   Sulfa Antibiotics Hives   Sulfacetamide Hives   Vioxx [Rofecoxib] Hives   Yellow Jacket Venom [Bee Venom] Swelling    Medications: Current Outpatient Medications  Medication Sig Dispense Refill   acetaminophen (TYLENOL) 500 MG tablet Take 1 tablet (500 mg total) by mouth every 6 (six) hours as needed. (Patient taking differently: Take 500 mg by mouth every 6 (six) hours as needed for mild pain (pain score 1-3).) 30 tablet 0   amiodarone (PACERONE) 200 MG tablet Take 1 tablet (200 mg total) by mouth daily. 90 tablet 3   aspirin EC 81 MG tablet Take 1 tablet (81 mg total) by mouth daily with breakfast. Swallow whole. 90 tablet 2   atorvastatin (LIPITOR) 40 MG tablet Take 1 tablet (40 mg total) by mouth daily. 90 tablet 3   cetirizine (ZYRTEC) 10 MG tablet Take 10 mg by mouth daily as needed for allergies.     dapagliflozin propanediol (FARXIGA) 5 MG TABS tablet Take 1 tablet (5 mg total) by mouth daily. 30 tablet 5   finasteride (PROSCAR) 5 MG tablet Take 1 tablet (5 mg total) by mouth daily. 90 tablet 3   fluticasone (FLONASE) 50 MCG/ACT nasal spray Place 1 spray into both nostrils as needed for allergies or rhinitis.   11   glipiZIDE (GLUCOTROL XL) 5 MG 24 hr tablet Take 10 mg by mouth daily.     isosorbide mononitrate (IMDUR) 30 MG 24 hr tablet Take 15 mg by mouth daily.     meclizine (ANTIVERT) 25 MG tablet Take 25 mg by mouth 3 (three) times daily as needed for dizziness.     metoprolol tartrate (LOPRESSOR) 25 MG tablet Take 1 tablet (25 mg total) by mouth 2 (two) times daily. (Patient taking differently: Take 25 mg by mouth daily.) 180 tablet 3   nitroGLYCERIN (NITROSTAT) 0.4 MG SL tablet Place 1 tablet (0.4 mg total) under  the tongue every 5 (five) minutes as needed for chest pain (severe chest pain or pressure only). 25 tablet 3   NOVOLIN N 100 UNIT/ML injection Inject 10 Units into the skin 2 (two) times daily as needed (If Blood Sugar gets above 100).     ONETOUCH ULTRA test strip USE 1 STRIP TO CHECK BLOOD GLUCOSE LEVEL TWICE DAILY.     pantoprazole (PROTONIX) 40 MG tablet Take 1 tablet (40 mg total) by mouth 2 (two) times daily. 60 tablet 2   Polyethylene Glycol 400 (BLINK TEARS OP) Place 1 drop into both eyes 2 (two) times daily.     silodosin (RAPAFLO) 8 MG CAPS capsule Take 1 capsule (8 mg total) by mouth at bedtime. 90 capsule 3   trospium (SANCTURA) 20 MG tablet Take 1 tablet (20 mg total) by mouth 2 (two) times daily. 60 tablet 11   Vibegron (GEMTESA) 75 MG TABS Take 1 tablet (75 mg total) by mouth daily. 30 tablet 11   No current facility-administered medications for this visit.    Review of Systems: GENERAL: negative for malaise, night sweats HEENT: No changes in hearing or vision, no nose bleeds or other nasal problems. NECK: Negative for lumps, goiter, pain and significant neck swelling RESPIRATORY: Negative for cough, wheezing CARDIOVASCULAR: Negative for chest pain, leg swelling, palpitations, orthopnea GI: SEE HPI MUSCULOSKELETAL: Negative for joint pain or swelling, back pain, and muscle pain. SKIN: Negative for lesions, rash HEMATOLOGY Negative for prolonged bleeding, bruising easily, and swollen nodes. ENDOCRINE: Negative for cold or heat intolerance, polyuria, polydipsia and goiter.  NEURO: negative for tremor, gait imbalance, syncope and seizures. The remainder of the review of systems is noncontributory.   Physical Exam: BP (!) 172/93   Pulse 71   Temp (!) 97.5 F (36.4 C)   Ht 5\' 8"  (1.727 m)   Wt 136 lb 8 oz (61.9 kg)   BMI 20.75 kg/m  GENERAL: The patient is AO x3, in no acute distress. HEENT: Head is normocephalic and atraumatic. EOMI are intact. Mouth is well hydrated  and without lesions. NECK: Supple. No masses LUNGS: Clear to auscultation. No presence of rhonchi/wheezing/rales. Adequate chest expansion HEART: RRR, normal s1 and s2. ABDOMEN: Soft, nontender, no guarding, no peritoneal signs, and nondistended. BS +. No masses.  Imaging/Labs: as above     Latest Ref Rng & Units 08/18/2023   10:56 AM 07/23/2023    1:11 PM 05/13/2023   11:43 AM  CBC  WBC 4.0 - 10.5 K/uL 4.6  4.6  4.7   Hemoglobin 13.0 - 17.0 g/dL 13.2  44.0  10.2   Hematocrit 39.0 - 52.0 % 36.1  39.7  35.2   Platelets 150 - 400 K/uL 265  273  272    No results found for: "IRON", "TIBC", "FERRITIN"  I personally reviewed and interpreted the available labs, imaging and endoscopic files.   CT without contrast 07/2023  IMPRESSION: 1. No noncontrast CT evidence of acute traumatic injury to the chest, abdomen, or pelvis. 2. Asymmetric fibrosis of the right lung base with areas of subpleural bronchiolectasis and honeycombing. 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 Consider pulmonary referral and dedicated interstitial lung disease CT on a nonemergent, outpatient basis if and when clinically appropriate. 3. Emphysema. 4. Coronary artery disease. 5. Severe prostatomegaly. 6. Probable septated parapelvic right renal cyst, incompletely characterized. Recommend nonemergent renal ultrasound to confirm cystic character.  Impression and Plan: Angel Dixon is a 83 y.o. male with PMH DM, CAD, HTN, GERD, HLD, who presents for follow up of  normocytic anemia, and intentional weight loss  #Dysphagia #Unintentional weight loss #Normocytic anemia   Patient recently underwent upper endoscopy and colonoscopy without any overt malignancy or bleeding.  He had esophageal dilation as well and removal of 1 colon polyp   Patient reports contributing difficulty swallowing near the throat area and since dilation was performed this  appears to be oropharyngeal dysphagia.  No further black-colored stools iron profile checked December 2024 without any iron deficiency  Patient main complaint today is unintentional weight loss despite good appetite.  Last 5 pounds in past 1 month  Last hemoglobin A1c 10 checked December 2024 normal thyroid profile.  Weight loss could be from uncontrolled diabetes but given advanced age malignancy needs to be excluded.  Last CT was done without contrast and hence is inadequate study to evaluate for malignancy  -CT abdomen pelvis with IV contrast pancreatic protocol, now -Ensure adequate glucose control -Follow-up with urology for urinary retention as recently Foley catheter was removed -If dysphagia persist may benefit in future from modified barium swallow and SLP evaluation -If complaining of black-colored stools reoccur may pursue capsule endoscopy.  All questions were answered.      Angel Lawman, MD Gastroenterology and Hepatology [facility] Gastroenterology   This chart has been completed using [facility] Dictation software, and while attempts have been made to ensure accuracy , certain words and phrases may not be transcribed as intended

## 2023-08-28 NOTE — Telephone Encounter (Signed)
 PA approved via evicore for CT ABD/PELVIS CPT Code: 57846 Description: CT ABDOMEN & PELVIS W/ Authorization Number: [id number] Case Number: [id number] Review Date: 08/28/2023 11:37:04 AM Expiration Date: 02/24/2024 Status: Your case has been Approved.   PA FOR CT CHEST APPROVED CPT Code: 36644 Description: CT THORAX W/ CONTRAST Authorization Number: [id number] Case Number: [id number] Review Date: 08/28/2023 11:40:49 AM Expiration Date: 02/24/2024 Status: Your case has been Approved.

## 2023-08-28 NOTE — Progress Notes (Signed)
 08/28/2023 9:14 AM   Angel Dixon [birth year] [id number]  Referring provider: Benita Stabile, MD [address] [place],  [place] [address]  Urinary retention   HPI: Angel Dixon is a 83yo here for followup for BPH and urinary retention. He had a foley place don 3/16 due to urinary retention. He had stopped his medications and then developed urinary retention requiring foley placement in the ER. He restarted meds 3/17 and passed voiding trial today   PMH: Past Medical History:  Diagnosis Date   Angina    CAD (coronary artery disease)    moderate, by cath   Diabetes mellitus    DJD (degenerative joint disease)    lumbar lam 4/05   Dyslipidemia    Dysrhythmia    "palpitation", PVCs   GERD (gastroesophageal reflux disease)    Hypertension    Palpitations    PUD (peptic ulcer disease)    endo 03/15/10    Surgical History: Past Surgical History:  Procedure Laterality Date   BACK SURGERY  2005   BIOPSY  07/18/2023   Procedure: BIOPSY;  Surgeon: Franky Macho, MD;  Location: [facility];  Service: Endoscopy;;   CARDIAC CATHETERIZATION  02/22/2006   mild obs. CAD 60-70% narrowing in small inferior branch of 1st diagonal of LAD, smooth 10% narrowing of mid RCA (Dr. 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 Jonette Eva)   CARDIAC CATHETERIZATION  05/11/2011   nonobstructive CAD (Dr. Ranae Palms)   COLONOSCOPY N/A 06/13/2016   Procedure: COLONOSCOPY;  Surgeon: Malissa Hippo, MD;  Location: [facility];  Service: Endoscopy;  Laterality: N/A;  1200   COLONOSCOPY WITH PROPOFOL N/A 07/18/2023   Procedure: COLONOSCOPY WITH PROPOFOL;  Surgeon: Franky Macho, MD;  Location: [facility];  Service: Endoscopy;  Laterality: N/A;  10:45AM; ASA 3   ESOPHAGOGASTRODUODENOSCOPY (EGD) WITH PROPOFOL N/A 07/18/2023   Procedure: ESOPHAGOGASTRODUODENOSCOPY (EGD) WITH PROPOFOL;  Surgeon: Franky Macho, MD;  Location: [facility];  Service: Endoscopy;  Laterality: N/A;  10:45AM;ASA 3   HERNIA REPAIR     INGUINAL HERNIA REPAIR  1982   right   LEFT HEART CATHETERIZATION WITH CORONARY ANGIOGRAM N/A 05/14/2011   Procedure: LEFT HEART CATHETERIZATION WITH CORONARY ANGIOGRAM;  Surgeon: Marykay Lex, MD;  Location: [facility] CATH [facility];  Service: Cardiovascular;  Laterality: N/A;  Coronary angiogram and possible PCI   LUMBAR LAMINECTOMY  09/2003   L2-3, 3-4, 4-5   NM MYOCAR PERF WALL MOTION  10/03/2009   bruce myoview - normal perfusion in all regions, EF 70%, no ischemia, low risk   POLYPECTOMY  07/18/2023   Procedure: POLYPECTOMY;  Surgeon: Franky Macho, MD;  [facility]: [facility];  Service: Endoscopy;;   SAVORY DILATION  07/18/2023   Procedure: SAVORY DILATION;  Surgeon: Franky Macho, MD;  [facility]: [facility];  Service: Endoscopy;;   TRANSTHORACIC ECHOCARDIOGRAM  03/28/2012   EF 65-70%, grade 1 diastolic dysfunction, mod AV calcification, mild Angel   XI ROBOTIC ASSISTED INGUINAL HERNIA REPAIR WITH MESH Left 12/20/2022   Procedure: XI ROBOTIC ASSISTED INGUINAL HERNIA REPAIR WITH MESH;  Surgeon: Franky Macho, MD;  Location: [facility];  Service: General;  Laterality: Left;    Home Medications:  Allergies as of 08/28/2023       Reactions   Lisinopril Other (See Comments)   Sore throat, no obvious angioedema or cough   Sulfa  Antibiotics Hives   Sulfacetamide Hives   Vioxx [rofecoxib] Hives   Yellow Jacket Venom [bee Venom] Swelling        Medication List        Accurate as of August 28, 2023  9:14 AM. If you have any questions, ask your nurse or doctor.          acetaminophen 500 MG tablet Commonly known as: TYLENOL Take 1 tablet (500 mg total) by mouth every 6 (six) hours as needed. What changed: reasons to take this   amiodarone 200 MG tablet Commonly known as: PACERONE Take 1 tablet (200 mg total) by mouth daily.   aspirin EC 81 MG tablet Take 1 tablet (81  mg total) by mouth daily with breakfast. Swallow whole.   atorvastatin 40 MG tablet Commonly known as: LIPITOR Take 1 tablet (40 mg total) by mouth daily.   BLINK TEARS OP Place 1 drop into both eyes 2 (two) times daily.   cetirizine 10 MG tablet Commonly known as: ZYRTEC Take 10 mg by mouth daily as needed for allergies.   dapagliflozin propanediol 5 MG Tabs tablet Commonly known as: Farxiga Take 1 tablet (5 mg total) by mouth daily.   finasteride 5 MG tablet Commonly known as: PROSCAR Take 1 tablet (5 mg total) by mouth daily.   fluticasone 50 MCG/ACT nasal spray Commonly known as: FLONASE Place 1 spray into both nostrils as needed for allergies or rhinitis.   Gemtesa 75 MG Tabs Generic drug: Vibegron Take 1 tablet (75 mg total) by mouth daily.   glipiZIDE 5 MG 24 hr tablet Commonly known as: GLUCOTROL XL Take 10 mg by mouth daily.   isosorbide mononitrate 30 MG 24 hr tablet Commonly known as: IMDUR Take 15 mg by mouth daily.   meclizine 25 MG tablet Commonly known as: ANTIVERT Take 25 mg by mouth 3 (three) times daily as needed for dizziness.   metoprolol tartrate 25 MG tablet Commonly known as: LOPRESSOR Take 1 tablet (25 mg total) by mouth 2 (two) times daily. What changed: when to take this   nitroGLYCERIN 0.4 MG SL tablet Commonly known as: NITROSTAT Place 1 tablet (0.4 mg total) under the tongue every 5 (five) minutes as needed for chest pain (severe chest pain or pressure only).   NovoLIN N 100 UNIT/ML injection Generic drug: insulin NPH Human Inject 10 Units into the skin 2 (two) times daily as needed (If Blood Sugar gets above 100).   OneTouch Ultra test strip Generic drug: glucose blood USE 1 STRIP TO CHECK BLOOD GLUCOSE LEVEL TWICE DAILY.   pantoprazole 40 MG tablet Commonly known as: PROTONIX Take 1 tablet (40 mg total) by mouth 2 (two) times daily.   silodosin 8 MG Caps capsule Commonly known as: RAPAFLO Take 1 capsule (8 mg total) by  mouth at bedtime.   trospium 20 MG tablet Commonly known as: SANCTURA Take 1 tablet (20 mg total) by mouth 2 (two) times daily.        Allergies:  Allergies  Allergen Reactions   Lisinopril Other (See Comments)    Sore throat, no obvious angioedema or cough   Sulfa Antibiotics Hives   Sulfacetamide Hives   Vioxx [Rofecoxib] Hives   Yellow Jacket Venom [Bee Venom] Swelling    Family History: Family History  Problem Relation Age of Onset   Kidney failure Mother        died 61   Coronary artery disease Father        died 48  Heart attack Sister        died of MI in her 30s   Sarcoidosis Daughter        died 25    Social History:  reports that he has quit smoking. His smoking use included cigarettes. He has a 25 pack-year smoking history. He has been exposed to tobacco smoke. He has never used smokeless tobacco. He reports that he does not currently use alcohol. He reports that he does not use drugs.  ROS: All other review of systems were reviewed and are negative except what is noted above in HPI  Physical Exam: BP (!) 148/87   Pulse 85   Constitutional:  Alert and oriented, No acute distress. HEENT: [place] AT, moist mucus membranes.  Trachea midline, no masses. Cardiovascular: No clubbing, cyanosis, or edema. Respiratory: Normal respiratory effort, no increased work of breathing. GI: Abdomen is soft, nontender, nondistended, no abdominal masses GU: No CVA tenderness.  Lymph: No cervical or inguinal lymphadenopathy. Skin: No rashes, bruises or suspicious lesions. Neurologic: Grossly intact, no focal deficits, moving all 4 extremities. Psychiatric: Normal mood and affect.  Laboratory Data: Lab Results  Component Value Date   WBC 4.6 08/18/2023   HGB 12.0 (L) 08/18/2023   HCT 36.1 (L) 08/18/2023   MCV 85.3 08/18/2023   PLT 265 08/18/2023    Lab Results  Component Value Date   CREATININE 1.06 08/18/2023    No results found for: "PSA"  No results found for:  "TESTOSTERONE"  Lab Results  Component Value Date   HGBA1C 8.9 (H) 01/27/2023    Urinalysis    Component Value Date/Time   COLORURINE STRAW (A) 08/18/2023 1042   APPEARANCEUR CLEAR 08/18/2023 1042   APPEARANCEUR Clear 03/13/2023 1023   LABSPEC 1.001 (L) 08/18/2023 1042   PHURINE 6.0 08/18/2023 1042   GLUCOSEU >=500 (A) 08/18/2023 1042   HGBUR NEGATIVE 08/18/2023 1042   BILIRUBINUR NEGATIVE 08/18/2023 1042   BILIRUBINUR Negative 03/13/2023 1023   KETONESUR NEGATIVE 08/18/2023 1042   PROTEINUR NEGATIVE 08/18/2023 1042   UROBILINOGEN 0.2 12/20/2014 1314   NITRITE NEGATIVE 08/18/2023 1042   LEUKOCYTESUR NEGATIVE 08/18/2023 1042    Lab Results  Component Value Date   LABMICR Comment 03/13/2023   BACTERIA NONE SEEN 08/18/2023    Pertinent Imaging:  Results for orders placed during the hospital encounter of 09/04/03  DG Abd 1 View  Narrative Clinical Data:    Postop from lumbar spine surgery.  Abdominal distention and pain. ABDOMEN, ONE VIEW Diffuse gaseous distention of colon is seen as well as small bowel loops and with gas noted to the level of the rectosigmoid region.  This is consistent with a postoperative ileus.  Midline abdominal skin staples are seen. IMPRESSION Ileus pattern.  Provider: Hillary Bow  No results found for this or any previous visit.  No results found for this or any previous visit.  No results found for this or any previous visit.  Results for orders placed during the hospital encounter of 06/19/16  US Renal  Narrative CLINICAL DATA:  BILATERAL flank pain, history hypertension, diabetes mellitus, GERD, coronary artery disease  EXAM: RENAL / URINARY TRACT ULTRASOUND COMPLETE  COMPARISON:  CT abdomen and pelvis 02/20/2007  FINDINGS: Right Kidney:  Length: 11.2 cm. Normal cortical thickness with upper normal cortical echogenicity. Large bilobed central RIGHT renal cyst 6.5 x 3.8 x 3.0 cm, increased in size since previous exam.  Slightly irregular septations at the central portion complicate the lesion. No additional mass, hydronephrosis or shadowing calcification.  Left Kidney:  Length: 11.9 cm. Normal cortical thickness. Upper normal cortical echogenicity. No mass, hydronephrosis, or shadowing calcification  Bladder:  Normal appearance. Enlarged prostate gland 6.8 x 5.2 x 5.9 cm noted.  IMPRESSION: Interval increase in size of a bilobed cyst at the mid RIGHT kidney, demonstrating slightly irregular and thick septation at the central portion ; further characterization of this lesion by Angel imaging with and without contrast recommended to exclude cystic renal neoplasm.  Significant prostatic enlargement.   Electronically Signed By: Ulyses Southward M.D. On: 06/19/2016 15:11  No results found for this or any previous visit.  No results found for this or any previous visit.  No results found for this or any previous visit.   Assessment & Plan:    1. Benign prostatic hyperplasia with urinary obstruction (Primary) Continue rapaflo 8mg . Followup 6-8 weeks with PVR - Bladder Voiding Trial - ciprofloxacin (CIPRO) tablet 500 mg  2. Urinary frequency Continue rapaflo 8mg   3. Nocturia Continue rapaflo 8mg    No follow-ups on file.  Wilkie Aye, MD  [facility] [place]

## 2023-08-28 NOTE — Progress Notes (Signed)
 Fill and Pull Catheter Removal  Patient is present today for a catheter removal.  of sterile water was instilled into the bladder when the patient felt the urge to urinate. 10ml of water was then drained from the balloon.  A 16FR foley cath was removed from the bladder no complications were noted .  Foley catheter intact and time of removal. Patient as then given some time to void on their own.  Patient can void  on their own after some time.  Patient tolerated well.  One oral prophylactic antibiotic given per MD orders  Performed by: Guss Bunde, CMA  Follow up/ Additional notes: MD to see after

## 2023-08-31 ENCOUNTER — Encounter (HOSPITAL_COMMUNITY): Payer: Self-pay | Admitting: *Deleted

## 2023-08-31 ENCOUNTER — Emergency Department (HOSPITAL_COMMUNITY)
Admission: EM | Admit: 2023-08-31 | Discharge: 2023-08-31 | Disposition: A | Discharge status: Home / Self Care | Attending: Emergency Medicine | Admitting: Emergency Medicine

## 2023-08-31 ENCOUNTER — Emergency Department (HOSPITAL_COMMUNITY)

## 2023-08-31 ENCOUNTER — Other Ambulatory Visit: Payer: Self-pay

## 2023-08-31 DIAGNOSIS — M48061 Spinal stenosis, lumbar region without neurogenic claudication: Secondary | ICD-10-CM | POA: Diagnosis not present

## 2023-08-31 DIAGNOSIS — R339 Retention of urine, unspecified: Secondary | ICD-10-CM | POA: Diagnosis present

## 2023-08-31 DIAGNOSIS — N401 Enlarged prostate with lower urinary tract symptoms: Secondary | ICD-10-CM

## 2023-08-31 DIAGNOSIS — Z794 Long term (current) use of insulin: Secondary | ICD-10-CM | POA: Diagnosis not present

## 2023-08-31 DIAGNOSIS — N400 Enlarged prostate without lower urinary tract symptoms: Secondary | ICD-10-CM | POA: Insufficient documentation

## 2023-08-31 DIAGNOSIS — N281 Cyst of kidney, acquired: Secondary | ICD-10-CM | POA: Diagnosis not present

## 2023-08-31 DIAGNOSIS — Z7982 Long term (current) use of aspirin: Secondary | ICD-10-CM | POA: Diagnosis not present

## 2023-08-31 DIAGNOSIS — M4800 Spinal stenosis, site unspecified: Secondary | ICD-10-CM

## 2023-08-31 LAB — CBC WITH DIFFERENTIAL/PLATELET
Abs Immature Granulocytes: 0.02 10*3/uL (ref 0.00–0.07)
Basophils Absolute: 0 10*3/uL (ref 0.0–0.1)
Basophils Relative: 1 %
Eosinophils Absolute: 0.1 10*3/uL (ref 0.0–0.5)
Eosinophils Relative: 1 %
HCT: 35.6 % — ABNORMAL LOW (ref 39.0–52.0)
Hemoglobin: 11.6 g/dL — ABNORMAL LOW (ref 13.0–17.0)
Immature Granulocytes: 0 %
Lymphocytes Relative: 12 %
Lymphs Abs: 0.9 10*3/uL (ref 0.7–4.0)
MCH: 27.8 pg (ref 26.0–34.0)
MCHC: 32.6 g/dL (ref 30.0–36.0)
MCV: 85.4 fL (ref 80.0–100.0)
Monocytes Absolute: 0.9 10*3/uL (ref 0.1–1.0)
Monocytes Relative: 11 %
Neutro Abs: 6.1 10*3/uL (ref 1.7–7.7)
Neutrophils Relative %: 75 %
Platelets: 268 10*3/uL (ref 150–400)
RBC: 4.17 MIL/uL — ABNORMAL LOW (ref 4.22–5.81)
RDW: 12.1 % (ref 11.5–15.5)
WBC: 8 10*3/uL (ref 4.0–10.5)
nRBC: 0 % (ref 0.0–0.2)

## 2023-08-31 LAB — URINALYSIS, ROUTINE W REFLEX MICROSCOPIC
Bilirubin Urine: NEGATIVE
Glucose, UA: >=500 mg/dL — AB
Hgb urine dipstick: NEGATIVE
Ketones, ur: NEGATIVE mg/dL
Leukocytes,Ua: NEGATIVE
Nitrite: NEGATIVE
Protein, ur: NEGATIVE mg/dL
Specific Gravity, Urine: 1.005 (ref 1.005–1.030)
pH: 7 (ref 5.0–8.0)

## 2023-08-31 LAB — BASIC METABOLIC PANEL WITH GFR
Anion gap: 8 (ref 5–15)
BUN: 16 mg/dL (ref 8–23)
CO2: 28 mmol/L (ref 22–32)
Calcium: 9.1 mg/dL (ref 8.9–10.3)
Chloride: 99 mmol/L (ref 98–111)
Creatinine, Ser: 1.26 mg/dL — ABNORMAL HIGH (ref 0.61–1.24)
GFR, Estimated: 57 mL/min — ABNORMAL LOW (ref >60)
Glucose, Bld: 314 mg/dL — ABNORMAL HIGH (ref 70–99)
Potassium: 5.1 mmol/L (ref 3.5–5.1)
Sodium: 135 mmol/L (ref 135–145)

## 2023-08-31 MED ORDER — IOHEXOL 300 MG/ML  SOLN
100.0000 mL | Freq: Once | INTRAMUSCULAR | Status: AC | PRN
  Administered 2023-08-31: 100 mL via INTRAVENOUS

## 2023-08-31 MED ORDER — LIDOCAINE HCL URETHRAL/MUCOSAL 2 % EX GEL
1.0000 | Freq: Once | CUTANEOUS | Status: DC
  Filled 2023-08-31: qty 10

## 2023-08-31 MED ORDER — CIPROFLOXACIN HCL 500 MG PO TABS: 500.0000 mg | ORAL_TABLET | Freq: Two times a day (BID) | ORAL | 0 refills | Status: AC

## 2023-08-31 MED ORDER — LIDOCAINE HCL URETHRAL/MUCOSAL 2 % EX GEL
1.0000 | Freq: Once | CUTANEOUS | Status: AC
  Administered 2023-08-31: 1 via TOPICAL
  Filled 2023-08-31: qty 10

## 2023-08-31 NOTE — ED Notes (Signed)
 Pt back from CT/MR

## 2023-08-31 NOTE — ED Provider Notes (Cosign Needed Addendum)
 Angel Dixon Provider Note   CSN: [id number] Arrival date & time: 08/31/23  1416     History  Chief Complaint  Patient presents with   Urinary Retention    Angel Dixon is a 83 y.o. male.  Patient is an 83 year old male who presents emergency department the chief complaint of urinary retention and possible constipation.  Patient notes that he has not urinated since yesterday and has he has not had a bowel movement since yesterday.  He does have a history of urinary retention and BPH and was seen in the emergency department a few weeks ago during which time a Foley catheter was placed.  He did have it removed 3 days ago and was urinating fine until today.  Patient denies any associated fever, chills, chest pain or shortness of breath.  He does admit to pain to his back.  He notes that the pain does radiate down towards his groin.        Home Medications Prior to Admission medications   Medication Sig Start Date End Date Taking? Authorizing Provider  acetaminophen (TYLENOL) 500 MG tablet Take 1 tablet (500 mg total) by mouth every 6 (six) hours as needed. Patient taking differently: Take 500 mg by mouth every 6 (six) hours as needed for mild pain (pain score 1-3). 01/26/23   Fayrene Helper, PA-C  amiodarone (PACERONE) 200 MG tablet Take 1 tablet (200 mg total) by mouth daily. 11/06/22   Shon Hale, MD  aspirin EC 81 MG tablet Take 1 tablet (81 mg total) by mouth daily with breakfast. Swallow whole. 11/06/22   Shon Hale, MD  atorvastatin (LIPITOR) 40 MG tablet Take 1 tablet (40 mg total) by mouth daily. 11/06/22   Shon Hale, MD  cetirizine (ZYRTEC) 10 MG tablet Take 10 mg by mouth daily as needed for allergies.    Medicine, [facility]  dapagliflozin propanediol (FARXIGA) 5 MG TABS tablet Take 1 tablet (5 mg total) by mouth daily. 11/06/22   Shon Hale, MD  finasteride (PROSCAR) 5 MG tablet Take 1 tablet (5 mg total)  by mouth daily. 08/28/23   McKenzie, Mardene Celeste, MD  fluticasone (FLONASE) 50 MCG/ACT nasal spray Place 1 spray into both nostrils as needed for allergies or rhinitis.  09/20/14   [provider]  glipiZIDE (GLUCOTROL XL) 5 MG 24 hr tablet Take 10 mg by mouth daily. 12/16/22   [provider]  isosorbide mononitrate (IMDUR) 30 MG 24 hr tablet Take 15 mg by mouth daily. 02/10/23   [provider]  meclizine (ANTIVERT) 25 MG tablet Take 25 mg by mouth 3 (three) times daily as needed for dizziness. 11/27/22   [provider]  metoprolol tartrate (LOPRESSOR) 25 MG tablet Take 1 tablet (25 mg total) by mouth 2 (two) times daily. Patient taking differently: Take 25 mg by mouth daily. 11/06/22   Shon Hale, MD  nitroGLYCERIN (NITROSTAT) 0.4 MG SL tablet Place 1 tablet (0.4 mg total) under the tongue every 5 (five) minutes as needed for chest pain (severe chest pain or pressure only). 08/12/20 05/19/29  Hilty, Lisette Abu, MD  NOVOLIN N 100 UNIT/ML injection Inject 10 Units into the skin 2 (two) times daily as needed (If Blood Sugar gets above 100). 10/17/22   [provider]  ONETOUCH ULTRA test strip USE 1 STRIP TO CHECK BLOOD GLUCOSE LEVEL TWICE DAILY. 11/22/20   [provider]  pantoprazole (PROTONIX) 40 MG tablet Take 1 tablet (40 mg total)  by mouth 2 (two) times daily. 11/06/22   Shon Hale, MD  Polyethylene Glycol 400 (BLINK TEARS OP) Place 1 drop into both eyes 2 (two) times daily.    [provider]  silodosin (RAPAFLO) 8 MG CAPS capsule Take 1 capsule (8 mg total) by mouth at bedtime. 08/28/23   McKenzie, Mardene Celeste, MD  trospium (SANCTURA) 20 MG tablet Take 1 tablet (20 mg total) by mouth 2 (two) times daily. 08/28/23   McKenzie, Mardene Celeste, MD  Vibegron (GEMTESA) 75 MG TABS Take 1 tablet (75 mg total) by mouth daily. 05/20/23   McKenzie, Mardene Celeste, MD      Allergies    Lisinopril, Sulfa antibiotics, Sulfacetamide, Vioxx [rofecoxib], and  Yellow jacket venom [bee venom]    Review of Systems   Review of Systems  Genitourinary:  Positive for decreased urine volume.  All other systems reviewed and are negative.   Physical Exam Updated Vital Signs BP (!) 184/120 (BP Location: Right Arm)   Pulse (!) 102   Temp 98.6 F (37 C) (Oral)   Resp 16   Ht 5\' 8"  (1.727 m)   Wt 61.9 kg   SpO2 98%   BMI 20.75 kg/m  Physical Exam Vitals and nursing note reviewed.  Constitutional:      Appearance: Normal appearance.  HENT:     Head: Normocephalic and atraumatic.     Nose: Nose normal.     Mouth/Throat:     Mouth: Mucous membranes are moist.  Eyes:     Extraocular Movements: Extraocular movements intact.     Conjunctiva/sclera: Conjunctivae normal.     Pupils: Pupils are equal, round, and reactive to light.  Cardiovascular:     Rate and Rhythm: Normal rate and regular rhythm.     Pulses: Normal pulses.     Heart sounds: Normal heart sounds. No murmur heard.    No gallop.  Pulmonary:     Effort: Pulmonary effort is normal. No respiratory distress.     Breath sounds: Normal breath sounds. No wheezing or rales.  Abdominal:     General: Abdomen is flat. Bowel sounds are normal.     Palpations: Abdomen is soft.     Tenderness: There is no guarding.     Comments: Tenderness palpation over suprapubic region as well as distention in the suprapubic region  Musculoskeletal:        General: Normal range of motion.     Cervical back: Normal range of motion and neck supple.     Right lower leg: No edema.     Left lower leg: No edema.  Skin:    General: Skin is warm and dry.     Findings: No bruising or rash.  Neurological:     General: No focal deficit present.     Mental Status: He is alert and oriented to person, place, and time. Mental status is at baseline.  Psychiatric:        Mood and Affect: Mood normal.        Behavior: Behavior normal.        Thought Content: Thought content normal.        Judgment: Judgment  normal.     ED Results / Procedures / Treatments   Labs (all labs ordered are listed, but only abnormal results are displayed) Labs Reviewed  BASIC METABOLIC PANEL WITH GFR  CBC WITH DIFFERENTIAL/PLATELET  URINALYSIS, ROUTINE W REFLEX MICROSCOPIC    EKG None  Radiology No results found.  Procedures Procedures  Medications Ordered in ED Medications - No data to display  ED Course/ Medical Decision Making/ A&P                                 Medical Decision Making Amount and/or Complexity of Data Reviewed Labs: ordered. Radiology: ordered.  Risk Prescription drug management.   This patient presents to the ED for concern of urinary retention, possible constipation, lower abdominal pain and groin pain, back pain differential diagnosis includes BPH, urinary tract infection, ureteral stone, acute kidney injury, cauda equina syndrome, vertebral osteomyelitis, epidural abscess    Additional history obtained:  Additional history obtained from daughter External records from outside source obtained and reviewed including medical records  Lab Tests:  I Ordered, and personally interpreted labs.  The pertinent results include: Mild anemia, elevated creatinine, elevated glucose, unremarkable urinalysis   Imaging Studies ordered:  I ordered imaging studies including CT scan of the abdomen and pelvis, MRI of the lumbar spine I independently visualized and interpreted imaging which showed severely enlarged prostate, stable renal cyst, spinal stenosis I agree with the radiologist interpretation   Problem List / ED Course:  Patient is doing much better at this time and is stable for discharge home.  Patient symptoms and pain have completely resolved after Foley catheter placement.  Will leave the Foley in place until he can follow-up with urology again.  Patient has no indication for obvious urinary tract infection at this point and was already placed on ciprofloxacin by  his urologist.  Creatinine was slightly increased I do suspect that this is secondary to his retention.  Glucose was elevated and patient was made aware of this.  He will take his medications when he gets home.  He does not meet criteria for DKA or HHS at this point I do not suspect that patient warrants emergent lowering of his glucose.  Patient does have stable vital signs at this point with no indication for sepsis.  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  Strict turn precautions were provided for any new or worsening symptoms.  Patient and daughter voiced understanding and had no additional questions.  Will call in Cipro at this time as patient notes that he does not have this medication at home though it is on the urologist note for patient to be taking it at this time.  Patient does not warrant any renal adjustment at this point.  Patient case was fully discussed with attending physician who is in agreement to plan at this time.   Social Determinants of Health:  None           Final Clinical Impression(s) / ED Diagnoses Final diagnoses:  None    Rx / DC Orders ED Discharge Orders     None         Lelon Perla, PA-C 08/31/23 1729    Lelon Perla, PA-C 08/31/23 1737    Bethann Berkshire, MD 09/01/23 1123

## 2023-08-31 NOTE — ED Notes (Signed)
 PA-C made aware of bladder scanner revealing 1100 mL of urine in bladder

## 2023-08-31 NOTE — ED Notes (Signed)
 Pt states pain has decreased post insertion of foley catheter

## 2023-08-31 NOTE — ED Triage Notes (Signed)
 Pt with urinary retention since yesterday and constipation. States last BM yesterday. Pt with pain with sitting. Pt with hx of urinary retention.

## 2023-08-31 NOTE — Discharge Instructions (Addendum)
 Please follow-up closely with your urologist on an outpatient basis.  Please continue taking the ciprofloxacin that you are prescribed.  Return to the emergency department immediately for any new or worsening symptoms.

## 2023-09-01 ENCOUNTER — Encounter: Payer: Self-pay | Admitting: Urology

## 2023-09-01 NOTE — Patient Instructions (Signed)

## 2023-09-09 ENCOUNTER — Ambulatory Visit (INDEPENDENT_AMBULATORY_CARE_PROVIDER_SITE_OTHER)

## 2023-09-09 DIAGNOSIS — Z466 Encounter for fitting and adjustment of urinary device: Secondary | ICD-10-CM | POA: Diagnosis not present

## 2023-09-09 DIAGNOSIS — R339 Retention of urine, unspecified: Secondary | ICD-10-CM

## 2023-09-09 NOTE — Progress Notes (Unsigned)
 Patient in office today for catheter check and foley anchor replacement. Foley catheter patent and intact.

## 2023-09-23 ENCOUNTER — Ambulatory Visit (INDEPENDENT_AMBULATORY_CARE_PROVIDER_SITE_OTHER)

## 2023-09-23 DIAGNOSIS — R339 Retention of urine, unspecified: Secondary | ICD-10-CM | POA: Diagnosis not present

## 2023-09-23 MED ORDER — CIPROFLOXACIN HCL 500 MG PO TABS
500.0000 mg | ORAL_TABLET | Freq: Once | ORAL | Status: AC
  Administered 2023-09-23: 500 mg via ORAL

## 2023-09-23 NOTE — Patient Instructions (Signed)

## 2023-09-23 NOTE — Progress Notes (Signed)
 Cath Change/ Replacement  Patient is present today for a catheter change due to urinary retention.  10ml of water  was removed from the balloon, a 16FR foley cath was removed without difficulty.  Patient was cleaned and prepped in a sterile fashion with Betadinex3.  A 16 FR foley cath was replaced into the bladder, no complications were noted. Urine return was noted 30ml and urine was Clear yellow in color. The balloon was filled with 10ml of sterile water . A bedside bag was attached for drainage.  Patient was given proper instruction on catheter care.    Performed by: Jaleesa Cervi LPN  Follow up:  keep scheduled ov

## 2023-10-02 ENCOUNTER — Ambulatory Visit

## 2023-10-07 ENCOUNTER — Ambulatory Visit (HOSPITAL_COMMUNITY)

## 2023-10-14 ENCOUNTER — Ambulatory Visit (INDEPENDENT_AMBULATORY_CARE_PROVIDER_SITE_OTHER): Admitting: Urology

## 2023-10-14 ENCOUNTER — Encounter: Payer: Self-pay | Admitting: Urology

## 2023-10-14 VITALS — BP 160/100 | HR 101

## 2023-10-14 DIAGNOSIS — N401 Enlarged prostate with lower urinary tract symptoms: Secondary | ICD-10-CM | POA: Diagnosis not present

## 2023-10-14 DIAGNOSIS — N138 Other obstructive and reflux uropathy: Secondary | ICD-10-CM

## 2023-10-14 DIAGNOSIS — Z978 Presence of other specified devices: Secondary | ICD-10-CM

## 2023-10-14 MED ORDER — CEPHALEXIN 250 MG PO CAPS
500.0000 mg | ORAL_CAPSULE | Freq: Once | ORAL | Status: AC
  Administered 2023-10-14: 500 mg via ORAL

## 2023-10-14 NOTE — Progress Notes (Signed)
 Name: Angel Dixon DOB: 1940/10/27 MRN: [id number]  History of Present Illness: [name] is a 83 y.o. male who presents today for follow up visit at [facility].  GU History includes: 1. BPH with BOO & LUTS (frequency, nocturia).  At last visit with Dr. Claretta Croft on 08/28/2023: - Seen for BPH and urinary retention. "He had a foley place don 3/16 due to urinary retention. He had stopped his medications and then developed urinary retention requiring foley placement in the ER."  - The plan was: "Continue rapaflo  8mg . Followup 6-8 weeks with PVR".  Since last visit: Urology nurse visits on 09/09/2023 and 09/23/2023 for catheter care.  Today: He reports the catheter is draining well.  He denies gross hematuria, fevers, constipation, flank pain, or abdominal pain.   Medications: Current Outpatient Medications  Medication Sig Dispense Refill   acetaminophen  (TYLENOL ) 500 MG tablet Take 1 tablet (500 mg total) by mouth every 6 (six) hours as needed. (Patient taking differently: Take 500 mg by mouth every 6 (six) hours as needed for mild pain (pain score 1-3).) 30 tablet 0   amiodarone  (PACERONE ) 200 MG tablet Take 1 tablet (200 mg total) by mouth daily. 90 tablet 3   aspirin  EC 81 MG tablet Take 1 tablet (81 mg total) by mouth daily with breakfast. Swallow whole. 90 tablet 2   atorvastatin  (LIPITOR) 40 MG tablet Take 1 tablet (40 mg total) by mouth daily. 90 tablet 3   cetirizine (ZYRTEC) 10 MG tablet Take 10 mg by mouth daily as needed for allergies.     dapagliflozin  propanediol (FARXIGA ) 5 MG TABS tablet Take 1 tablet (5 mg total) by mouth daily. 30 tablet 5   finasteride  (PROSCAR ) 5 MG tablet Take 1 tablet (5 mg total) by mouth daily. 90 tablet 3   fluticasone  (FLONASE ) 50 MCG/ACT nasal spray Place 1 spray into both nostrils as needed for allergies or rhinitis.   11   glipiZIDE  (GLUCOTROL  XL) 5 MG 24 hr tablet Take 10 mg by mouth daily.     isosorbide   mononitrate (IMDUR ) 30 MG 24 hr tablet Take 15 mg by mouth daily.     meclizine (ANTIVERT) 25 MG tablet Take 25 mg by mouth 3 (three) times daily as needed for dizziness.     metoprolol  tartrate (LOPRESSOR ) 25 MG tablet Take 1 tablet (25 mg total) by mouth 2 (two) times daily. (Patient taking differently: Take 25 mg by mouth daily.) 180 tablet 3   nitroGLYCERIN  (NITROSTAT ) 0.4 MG SL tablet Place 1 tablet (0.4 mg total) under the tongue every 5 (five) minutes as needed for chest pain (severe chest pain or pressure only). 25 tablet 3   NOVOLIN N 100 UNIT/ML injection Inject 10 Units into the skin 2 (two) times daily as needed (If Blood Sugar gets above 100).     ONETOUCH ULTRA test strip USE 1 STRIP TO CHECK BLOOD GLUCOSE LEVEL TWICE DAILY.     pantoprazole  (PROTONIX ) 40 MG tablet Take 1 tablet (40 mg total) by mouth 2 (two) times daily. 60 tablet 2   Polyethylene Glycol 400 (BLINK TEARS OP) Place 1 drop into both eyes 2 (two) times daily.     silodosin  (RAPAFLO ) 8 MG CAPS capsule Take 1 capsule (8 mg total) by mouth at bedtime. 90 capsule 3   No current facility-administered medications for this visit.    Allergies: Allergies  Allergen Reactions   Lisinopril  Other (See Comments)    Sore throat, no obvious angioedema or  cough   Sulfa Antibiotics Hives   Sulfacetamide Hives   Vioxx [Rofecoxib] Hives   Yellow Jacket Venom [Bee Venom] Swelling    Past Medical History:  Diagnosis Date   Angina    CAD (coronary artery disease)    moderate, by cath   Diabetes mellitus    DJD (degenerative joint disease)    lumbar lam 4/05   Dyslipidemia    Dysrhythmia    "palpitation", PVCs   GERD (gastroesophageal reflux disease)    Hypertension    Palpitations    PUD (peptic ulcer disease)    endo 03/15/10   Past Surgical History:  Procedure Laterality Date   BACK SURGERY  2005   BIOPSY  07/18/2023   Procedure: BIOPSY;  Surgeon: Hargis Lias, MD;  [facility];  Service:  Endoscopy;;   CARDIAC CATHETERIZATION  02/22/2006   mild obs. CAD 60-70% narrowing in small inferior branch of 1st diagonal of LAD, smooth 10% narrowing of mid RCA (Dr. 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 Savilla Curls)   CARDIAC CATHETERIZATION  05/11/2011   nonobstructive CAD (Dr. Truddie Furrow)   COLONOSCOPY N/A 06/13/2016   Procedure: COLONOSCOPY;  Surgeon: Ruby Corporal, MD;  [facility]: [facility];  Service: Endoscopy;  Laterality: N/A;  1200   COLONOSCOPY WITH PROPOFOL  N/A 07/18/2023   Procedure: COLONOSCOPY WITH PROPOFOL ;  Surgeon: Hargis Lias, MD;  [facility];  Service: Endoscopy;  Laterality: N/A;  10:45AM; ASA 3   ESOPHAGOGASTRODUODENOSCOPY (EGD) WITH PROPOFOL  N/A 07/18/2023   Procedure: ESOPHAGOGASTRODUODENOSCOPY (EGD) WITH PROPOFOL ;  Surgeon: Hargis Lias, MD;  [facility];  Service: Endoscopy;  Laterality: N/A;  10:45AM;ASA 3   HERNIA REPAIR     INGUINAL HERNIA REPAIR  1982   right   LEFT HEART CATHETERIZATION WITH CORONARY ANGIOGRAM N/A 05/14/2011   Procedure: LEFT HEART CATHETERIZATION WITH CORONARY ANGIOGRAM;  Surgeon: Arleen Lacer, MD;  [facility];  Service: Cardiovascular;  Laterality: N/A;  Coronary angiogram and possible PCI   LUMBAR LAMINECTOMY  09/2003   L2-3, 3-4, 4-5   NM MYOCAR PERF WALL MOTION  10/03/2009   bruce myoview  - normal perfusion in all regions, EF 70%, no ischemia, low risk   POLYPECTOMY  07/18/2023   Procedure: POLYPECTOMY;  Surgeon: Hargis Lias, MD;  [facility]: [facility];  Service: Endoscopy;;   SAVORY DILATION  07/18/2023   Procedure: SAVORY DILATION;  Surgeon: Hargis Lias, MD;  [facility];  Service: Endoscopy;;   TRANSTHORACIC ECHOCARDIOGRAM  03/28/2012   EF 65-70%, grade 1 diastolic dysfunction, mod AV calcification, mild MR   XI ROBOTIC ASSISTED INGUINAL HERNIA REPAIR WITH MESH Left  12/20/2022   Procedure: XI ROBOTIC ASSISTED INGUINAL HERNIA REPAIR WITH MESH;  Surgeon: Alanda Allegra, MD;  Location: [facility];  Service: General;  Laterality: Left;   Family History  Problem Relation Age of Onset   Kidney failure Mother        died 35   Coronary artery disease Father        died 52   Heart attack Sister        died of MI in her 30s   Sarcoidosis Daughter        died 16   Social History   Socioeconomic History   Marital status: Married    Spouse name: Not on file   Number of children:  4   Years of education: 35   Highest education level: Not on file  Occupational History   Occupation: retired  Tobacco Use   Smoking status: Former    Current packs/day: 1.00    Average packs/day: 1 pack/day for 25.0 years (25.0 ttl pk-yrs)    Types: Cigarettes    Passive exposure: Past   Smokeless tobacco: Never   Tobacco comments:    quit smoking cigarettes ~ 1990  Vaping Use   Vaping status: Never Used  Substance and Sexual Activity   Alcohol  use: Not Currently    Comment: very seldom   Drug use: No   Sexual activity: Yes  Other Topics Concern   Not on file  Social History Narrative   Not on file   Social Drivers of Health   Financial Resource Strain: Not on file  Food Insecurity: No Food Insecurity (01/27/2023)   Hunger Vital Sign    Worried About Running Out of Food in the Last Year: Never true    Ran Out of Food in the Last Year: Never true  Transportation Needs: No Transportation Needs (01/27/2023)   PRAPARE - Administrator, Civil Service (Medical): No    Lack of Transportation (Non-Medical): No  Physical Activity: Not on file  Stress: Not on file  Social Connections: Not on file  Intimate Partner Violence: Not At Risk (01/27/2023)   Humiliation, Afraid, Rape, and Kick questionnaire    Fear of Current or Ex-Partner: No    Emotionally Abused: No    Physically Abused: No    Sexually Abused: No    Review of Systems Constitutional: Patient  denies any unintentional weight loss or change in strength lntegumentary: Patient denies any rashes or pruritus Cardiovascular: Patient denies chest pain or syncope Respiratory: Patient denies shortness of breath Gastrointestinal: As per HPI Musculoskeletal: Patient denies muscle cramps or weakness Neurologic: Patient denies convulsions or seizures Allergic/Immunologic: Patient denies recent allergic reaction(s) Hematologic/Lymphatic: Patient denies bleeding tendencies Endocrine: Patient denies heat/cold intolerance  GU: As per HPI.  OBJECTIVE Vitals:   10/14/23 1025  BP: (!) 160/100  Pulse: (!) 101   There is no height or weight on file to calculate BMI.  Physical Examination Constitutional: No obvious distress; patient is non-toxic appearing  Cardiovascular: No visible lower extremity edema.  Respiratory: The patient does not have audible wheezing/stridor; respirations do not appear labored  Gastrointestinal: Abdomen non-distended Musculoskeletal: Normal ROM of UEs  Skin: No obvious rashes/open sores  Neurologic: CN 2-12 grossly intact Psychiatric: Answered questions appropriately with normal affect  Hematologic/Lymphatic/Immunologic: No obvious bruises or sites of spontaneous bleeding   ASSESSMENT Benign prostatic hyperplasia with urinary obstruction - Plan: Bladder Voiding Trial, cephALEXin (KEFLEX) capsule 500 mg  Foley catheter in place - Plan: Bladder Voiding Trial, cephALEXin (KEFLEX) capsule 500 mg  Passed voiding trial. Foley catheter to remain out. In addition to continuing Proscar  and Rapaflo , he was advised to maintain adequate daily fluid intake and to avoid / manage constipation to prevent recurrence of acute urinary retention.   Advised nurse visit in 1-2 days for PVR check to ensure continued adequate bladder emptying.   Also offered clean intermittent catheterization (CIC) teaching for PRN use; he declined that.   He is interested in surgery to  definitively address his BPH with BOO. Advised cystoscopy with MD for further evaluation and surgical consultation.    Patient verbalized understanding of and agreement with current plan. All questions were answered.  PLAN Advised the following: 1. Foley catheter  discontinued. 2. Nurse visit in 1-2 days for PVR check. 3. Continue Proscar  (Finasteride ) 5 mg daily. 4. Continue Rapaflo  (Silodosin ) 8 mg daily.  5. If taking, discontinue Sanctura  (Trospium ). 6. If taking, discontinue Gemtesa (Vibegron) 75 mg daily.  7. Maintain adequate daily fluid intake.  8. Avoid / manage constipation.  9. Return for 1st available cystoscopy with any urology MD, BPH surgery consult.   Orders Placed This Encounter  Procedures   Bladder Voiding Trial   Total time spent caring for the patient today was over 30 minutes. This includes time spent on the date of the visit reviewing the patient's chart before the visit, time spent during the visit, and time spent after the visit on documentation. Over 50% of that time was spent in face-to-face time with this patient for direct counseling. E&M based on time and complexity of medical decision making.  It has been explained that the patient is to follow regularly with their PCP in addition to all other providers involved in their care and to follow instructions provided by these respective offices. Patient advised to contact urology clinic if any urologic-pertaining questions, concerns, new symptoms or problems arise in the interim period.  Patient Instructions  Plan for today: 1. Foley catheter discontinued. 2. Nurse visit in 1-2 days for PVR check. 3. Continue Proscar  (Finasteride ) 5 mg daily. 4. Continue Rapaflo  (Silodosin ) 8 mg daily.  5. If taking, discontinue Sanctura  (Trospium ). 6. If taking, discontinue Gemtesa (Vibegron) 75 mg daily.  7. Maintain adequate daily fluid intake.  8. Avoid / manage constipation.  9. Return for cystoscopy and BPH surgery  consult.  Electronically signed by:  Lauretta Ponto, FNP   10/14/23    11:21 AM

## 2023-10-14 NOTE — Progress Notes (Signed)
 Fill and Pull Catheter Removal  Patient is present today for a catheter removal.  370 ml of sterile water  was instilled into the bladder when the patient felt the urge to urinate. 10 ml of water  was then drained from the balloon.  A 16 FR foley cath was removed from the bladder no complications were noted .  Foley catheter intact and time of removal. Patient as then given some time to void on their own.  Patient can void  275 ml on their own after some time.  Patient tolerated well.  One oral prophylactic antibiotic given per MD orders  Performed by: Gorden Latino, CMA  Follow up/ Additional notes: keep cysto appt

## 2023-10-14 NOTE — Patient Instructions (Addendum)
 Plan for today: 1. Foley catheter discontinued. 2. Nurse visit in 1-2 days for PVR check. 3. Continue Proscar  (Finasteride ) 5 mg daily. 4. Continue Rapaflo  (Silodosin ) 8 mg daily.  5. If taking, discontinue Sanctura  (Trospium ). 6. If taking, discontinue Gemtesa (Vibegron) 75 mg daily.  7. Maintain adequate daily fluid intake.  8. Avoid / manage constipation.  9. Return for cystoscopy and BPH surgery consult.

## 2023-10-15 ENCOUNTER — Encounter: Payer: Self-pay | Admitting: Internal Medicine

## 2023-10-15 ENCOUNTER — Ambulatory Visit: Attending: Internal Medicine | Admitting: Internal Medicine

## 2023-10-15 ENCOUNTER — Ambulatory Visit

## 2023-10-15 VITALS — BP 184/98 | HR 77 | Ht 68.0 in | Wt 133.8 lb

## 2023-10-15 DIAGNOSIS — I493 Ventricular premature depolarization: Secondary | ICD-10-CM

## 2023-10-15 MED ORDER — AMIODARONE HCL 200 MG PO TABS: 200.0000 mg | ORAL_TABLET | Freq: Every day | ORAL | 3 refills | Status: AC

## 2023-10-15 NOTE — Progress Notes (Unsigned)
 Enrolled patient for a 3 day Zio XT monitor to be mailed to patients home

## 2023-10-15 NOTE — Progress Notes (Signed)
 HPI Mr. Zientek returns today for followup. 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 The patient otherwise feels well. No syncope. His blood sugar has been controlled. His symptoms are improved. His dyspnea is better. He has had some prostate problems and recently had his indwelling foley removed.   Allergies  Allergen Reactions   Lisinopril  Other (See Comments)    Sore throat, no obvious angioedema or cough   Sulfa Antibiotics Hives   Sulfacetamide Hives   Vioxx [Rofecoxib] Hives   Yellow Jacket Venom [Bee Venom] Swelling     Current Outpatient Medications  Medication Sig Dispense Refill   acetaminophen  (TYLENOL ) 500 MG tablet Take 1 tablet (500 mg total) by mouth every 6 (six) hours as needed. (Patient taking differently: Take 500 mg by mouth every 6 (six) hours as needed for mild pain (pain score 1-3).) 30 tablet 0   amiodarone  (PACERONE ) 200 MG tablet Take 1 tablet (200 mg total) by mouth daily. 90 tablet 3   aspirin  EC 81 MG tablet Take 1 tablet (81 mg total) by mouth daily with breakfast. Swallow whole. 90 tablet 2   atorvastatin  (LIPITOR) 40 MG tablet Take 1 tablet (40 mg total) by mouth daily. 90 tablet 3   cetirizine (ZYRTEC) 10 MG tablet Take 10 mg by mouth daily as needed for allergies.     dapagliflozin  propanediol (FARXIGA ) 5 MG TABS tablet Take 1 tablet (5 mg total) by mouth daily. 30 tablet 5   finasteride  (PROSCAR ) 5 MG tablet Take 1 tablet (5 mg total) by mouth daily. 90 tablet 3   fluticasone  (FLONASE ) 50 MCG/ACT nasal spray Place 1 spray into both nostrils as needed for allergies or rhinitis.   11   glipiZIDE  (GLUCOTROL  XL) 5 MG 24 hr tablet Take 10 mg by mouth daily.     isosorbide  mononitrate (IMDUR ) 30 MG 24 hr tablet Take 15 mg by mouth daily.     meclizine (ANTIVERT) 25 MG tablet Take 25 mg by mouth 3 (three) times daily as needed for dizziness.     metoprolol  tartrate (LOPRESSOR ) 25 MG tablet Take  1 tablet (25 mg total) by mouth 2 (two) times daily. (Patient taking differently: Take 25 mg by mouth daily.) 180 tablet 3   nitroGLYCERIN  (NITROSTAT ) 0.4 MG SL tablet Place 1 tablet (0.4 mg total) under the tongue every 5 (five) minutes as needed for chest pain (severe chest pain or pressure only). 25 tablet 3   NOVOLIN N 100 UNIT/ML injection Inject 10 Units into the skin 2 (two) times daily as needed (If Blood Sugar gets above 100).     ONETOUCH ULTRA test strip USE 1 STRIP TO CHECK BLOOD GLUCOSE LEVEL TWICE DAILY.     pantoprazole  (PROTONIX ) 40 MG tablet Take 1 tablet (40 mg total) by mouth 2 (two) times daily. 60 tablet 2   Polyethylene Glycol 400 (BLINK TEARS OP) Place 1 drop into both eyes 2 (two) times daily.     silodosin  (RAPAFLO ) 8 MG CAPS capsule Take 1 capsule (8 mg total) by mouth at bedtime. 90 capsule 3   No current facility-administered medications for this visit.     Past Medical History:  Diagnosis Date   Angina    CAD (coronary artery disease)    moderate, by cath   Diabetes mellitus    DJD (degenerative joint disease)    lumbar lam 4/05   Dyslipidemia    Dysrhythmia    "  palpitation", PVCs   GERD (gastroesophageal reflux disease)    Hypertension    Palpitations    PUD (peptic ulcer disease)    endo 03/15/10    ROS:   All systems reviewed and negative except as noted in the HPI.   Past Surgical History:  Procedure Laterality Date   BACK SURGERY  2005   BIOPSY  07/18/2023   Procedure: BIOPSY;  Surgeon: Hargis Lias, MD;  [facility];  Service: Endoscopy;;   CARDIAC CATHETERIZATION  02/22/2006   mild obs. CAD 60-70% narrowing in small inferior branch of 1st diagonal of LAD, smooth 10% narrowing of mid RCA (Dr. 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 Savilla Curls)   CARDIAC CATHETERIZATION  05/11/2011   nonobstructive CAD (Dr. Truddie Furrow)   COLONOSCOPY  N/A 06/13/2016   Procedure: COLONOSCOPY;  Surgeon: Ruby Corporal, MD;  [facility];  Service: Endoscopy;  Laterality: N/A;  1200   COLONOSCOPY WITH PROPOFOL  N/A 07/18/2023   Procedure: COLONOSCOPY WITH PROPOFOL ;  Surgeon: Hargis Lias, MD;  [facility];  Service: Endoscopy;  Laterality: N/A;  10:45AM; ASA 3   ESOPHAGOGASTRODUODENOSCOPY (EGD) WITH PROPOFOL  N/A 07/18/2023   Procedure: ESOPHAGOGASTRODUODENOSCOPY (EGD) WITH PROPOFOL ;  Surgeon: Hargis Lias, MD;  [facility];  Service: Endoscopy;  Laterality: N/A;  10:45AM;ASA 3   HERNIA REPAIR     INGUINAL HERNIA REPAIR  1982   right   LEFT HEART CATHETERIZATION WITH CORONARY ANGIOGRAM N/A 05/14/2011   Procedure: LEFT HEART CATHETERIZATION WITH CORONARY ANGIOGRAM;  Surgeon: Arleen Lacer, MD;  Location: [facility] CATH LAB;  Service: Cardiovascular;  Laterality: N/A;  Coronary angiogram and possible PCI   LUMBAR LAMINECTOMY  09/2003   L2-3, 3-4, 4-5   NM MYOCAR PERF WALL MOTION  10/03/2009   bruce myoview  - normal perfusion in all regions, EF 70%, no ischemia, low risk   POLYPECTOMY  07/18/2023   Procedure: POLYPECTOMY;  Surgeon: Hargis Lias, MD;  [facility];  Service: Endoscopy;;   SAVORY DILATION  07/18/2023   Procedure: SAVORY DILATION;  Surgeon: Hargis Lias, MD;  [facility];  Service: Endoscopy;;   TRANSTHORACIC ECHOCARDIOGRAM  03/28/2012   EF 65-70%, grade 1 diastolic dysfunction, mod AV calcification, mild MR   XI ROBOTIC ASSISTED INGUINAL HERNIA REPAIR WITH MESH Left 12/20/2022   Procedure: XI ROBOTIC ASSISTED INGUINAL HERNIA REPAIR WITH MESH;  Surgeon: Alanda Allegra, MD;  Location: [facility];  Service: General;  Laterality: Left;     Family History  Problem Relation Age of Onset   Kidney failure Mother        died 73   Coronary artery disease Father        died 28   Heart attack Sister        died of MI in her 62s   Sarcoidosis Daughter        died 30      Social History   Socioeconomic History   Marital status: Married    Spouse name: Not on file   Number of children: 4   Years of education: 12   Highest education level: Not on file  Occupational History   Occupation: retired  Tobacco Use   Smoking status: Former    Current packs/day: 1.00    Average packs/day: 1 pack/day for 25.0 years (25.0 ttl pk-yrs)    Types: Cigarettes  Passive exposure: Past   Smokeless tobacco: Never   Tobacco comments:    quit smoking cigarettes ~ 1990  Vaping Use   Vaping status: Never Used  Substance and Sexual Activity   Alcohol  use: Not Currently    Comment: very seldom   Drug use: No   Sexual activity: Yes  Other Topics Concern   Not on file  Social History Narrative   Not on file   Social Drivers of Health   Financial Resource Strain: Not on file  Food Insecurity: No Food Insecurity (01/27/2023)   Hunger Vital Sign    Worried About Running Out of Food in the Last Year: Never true    Ran Out of Food in the Last Year: Never true  Transportation Needs: No Transportation Needs (01/27/2023)   PRAPARE - Administrator, Civil Service (Medical): No    Lack of Transportation (Non-Medical): No  Physical Activity: Not on file  Stress: Not on file  Social Connections: Not on file  Intimate Partner Violence: Not At Risk (01/27/2023)   Humiliation, Afraid, Rape, and Kick questionnaire    Fear of Current or Ex-Partner: No    Emotionally Abused: No    Physically Abused: No    Sexually Abused: No     BP (!) 184/98   Pulse 77   Ht 5\' 8"  (1.727 m)   Wt 133 lb 12.8 oz (60.7 kg)   SpO2 98%   BMI 20.34 kg/m   Physical Exam:  Well appearing 83 yo man, NAD HEENT: Unremarkable Neck:  No JVD, no thyromegally Lymphatics:  No adenopathy Back:  No CVA tenderness Lungs:  Clear with no wheezes HEART:  Regular rate rhythm, no murmurs, no rubs, no clicks Abd:  soft, positive bowel sounds, no organomegally, no rebound, no  guarding Ext:  2 plus pulses, no edema, no cyanosis, no clubbing Skin:  No rashes no nodules Neuro:  CN II through XII intact, motor grossly intact  EKG - nsr with no PVC's  A/P Freq PVC's - He appears to be improved. He will continue amiodarone  200 mg daily but none on Sat or Sun. I have asked him to wear a 3 day zio to eval for residual PVC's.If good he will continue 1 gm of amio a week. CAD - he denies anginal symptoms. We will follow. HTN - he has white coat HTN. I have asked him to check his bp at home. If high I would have him switch from metoprolol  to coreg.   Pete Brand Rosann Gorum,MD

## 2023-10-15 NOTE — Patient Instructions (Addendum)
 Medication Instructions:  Your physician has recommended you make the following change in your medication:  Decrease your amiodarone  to 1 tablet daily Monday-Friday. Do not take on Saturday or Sunday  Lab Work: None ordered.  You may go to any [facility] Location for your lab work:  [facility] - [address] ([facility]) - [address] [phone] [address]  [place] - [address]   [place]  - [address]   [place] - [address] - [address] [employer]  - [address] [place] [address] ([facility]   If you have labs (blood work) drawn today and your tests are completely normal, you will receive your results only by: [employer] (if you have MyChart)  If you have any lab test that is abnormal or we need to change your treatment, we will call you or send a MyChart message to review the results.  Testing/Procedures: Delane Fear- Long Term Monitor Instructions     Your physician has requested you wear a ZIO patch monitor for 3 days.  This is a single patch monitor. [employer] supplies one patch monitor per enrollment. Additional  stickers are not available. Please do not apply patch if you will be having a Nuclear Stress Test,  Echocardiogram, Cardiac CT, MRI, or Chest Xray during the period you would be wearing the  monitor. The patch cannot be worn during these tests. You cannot remove and re-apply the  ZIO XT patch monitor.  Your ZIO patch monitor will be mailed 3 day USPS to your address on file. It may take 3-5 days  to receive your monitor after you have been enrolled.  Once you have received your monitor, please review the enclosed instructions. Your monitor  has already been registered assigning a specific monitor serial # to you.     Billing and Patient Assistance Program Information     We have supplied [employer] with any of your insurance  information on file for billing purposes.  [employer] offers a sliding scale Patient Assistance Program for patients that do not have  insurance, or whose insurance does not completely cover the cost of the ZIO monitor.  You must apply for the Patient Assistance Program to qualify for this discounted rate.  To apply, please call [employer] at [phone], select option 4, select option 2, ask to apply for  Patient Assistance Program. Sanna Crystal will ask your household income, and how many people  are in your household. They will quote your out-of-pocket cost based on that information.  [employer] will also be able to set up a 42-month, interest-free payment plan if needed.     Applying the monitor     Shave hair from upper left chest.  Hold abrader disc by orange tab. Rub abrader in 40 strokes over the upper left chest as  indicated in your monitor instructions.  Clean area with 4 enclosed alcohol  pads. Let dry.  Apply patch as indicated in monitor instructions. Patch will be placed under collarbone on left  side of chest with arrow pointing upward.  Rub patch adhesive wings for 2 minutes. Remove white label marked "1". Remove the white  label marked "2". Rub patch adhesive wings for 2 additional minutes.  While looking in a mirror, press and release button in center of patch. A small green light will  flash 3-4 times. This will be  your only indicator that the monitor has been turned on.  Do not shower for the first 24 hours. You may shower after the first 24 hours.  Press the button if you feel a symptom. You will hear a small click. Record Date, Time and  Symptom in the Patient Logbook.  When you are ready to remove the patch, follow instructions on the last 2 pages of Patient  Logbook. Stick patch monitor onto the last page of Patient Logbook.  Place Patient Logbook in the blue and white box. Use locking tab on box and tape box closed  securely. The blue and white box has prepaid postage on it.  Please place it in the mailbox as  soon as possible. Your physician should have your test results approximately 7 days after the  monitor has been mailed back to [facility].  Call [facility] Customer Care at [phone] if you have questions regarding  your ZIO XT patch monitor. Call them immediately if you see an orange light blinking on your  monitor.  If your monitor falls off in less than 4 days, contact our Monitor department at [phone].  If your monitor becomes loose or falls off after 4 days call [employer] at [phone] for  suggestions on securing your monitor.    Follow-Up: At [facility], you and your health needs are our priority.  As part of our continuing mission to provide you with exceptional heart care, we have created designated Provider Care Teams.  These Care Teams include your primary Cardiologist (physician) and Advanced Practice Providers (APPs -  Physician Assistants and Nurse Practitioners) who all work together to provide you with the care you need, when you need it.  Your next appointment:   To be determined after testing  The format for your next appointment:   In Person  Provider:   Manya Sells, MD{or one of the following Advanced Practice Providers on your designated Care Team:   Mertha Abrahams, [place] Bambi Lever "Jonelle Neri" [place], [place] Neda Balk, NP  Note: Remote monitoring is used to monitor your Pacemaker/ ICD from home. This monitoring reduces the number of office visits required to check your device to one time per year. It allows us  to keep an eye on the functioning of your device to ensure it is working properly.

## 2023-10-16 ENCOUNTER — Ambulatory Visit (INDEPENDENT_AMBULATORY_CARE_PROVIDER_SITE_OTHER)

## 2023-10-16 DIAGNOSIS — N138 Other obstructive and reflux uropathy: Secondary | ICD-10-CM

## 2023-10-16 DIAGNOSIS — N401 Enlarged prostate with lower urinary tract symptoms: Secondary | ICD-10-CM

## 2023-10-16 LAB — BLADDER SCAN AMB NON-IMAGING: Scan Result: 48

## 2023-10-16 NOTE — Progress Notes (Signed)
 Bladder Scan completed today.  Patient can void prior to the bladder scan. Bladder scan result: 48  Performed By: Gorden Latino, CMA  Additional notes-

## 2023-10-18 ENCOUNTER — Ambulatory Visit: Admitting: Urology

## 2023-10-21 ENCOUNTER — Encounter (INDEPENDENT_AMBULATORY_CARE_PROVIDER_SITE_OTHER): Payer: Self-pay | Admitting: Gastroenterology

## 2023-10-21 ENCOUNTER — Ambulatory Visit (INDEPENDENT_AMBULATORY_CARE_PROVIDER_SITE_OTHER): Admitting: Gastroenterology

## 2023-10-21 VITALS — BP 133/79 | HR 66 | Temp 97.5°F | Ht 68.0 in | Wt 135.1 lb

## 2023-10-21 DIAGNOSIS — D649 Anemia, unspecified: Secondary | ICD-10-CM | POA: Diagnosis not present

## 2023-10-21 DIAGNOSIS — R634 Abnormal weight loss: Secondary | ICD-10-CM | POA: Diagnosis not present

## 2023-10-21 NOTE — Progress Notes (Signed)
 Referring Provider: Omie Bickers, MD Primary Care Physician:  Omie Bickers, MD Primary GI Physician: Dr. Alita Irwin   Chief Complaint  Patient presents with   Follow-up    Patient here today due to a follow up. Patient denies any sight of dark of bloody stools, or any other current gi issues.    HPI:   Angel Dixon is a 83 y.o. male with past medical history of DM, CAD, HTN, GERD, HLD   Patient presenting today for:  Follow up of anemia and weight loss  Last seen march 2025, at that time, pt had recent EGD/Colonoscopy without overt malignancy or bleeding. Continued to have weight loss. Some slight dysphagia, no further black colored stools.   Recommended CT C/A/P with iv contrast, pancreatic protocol, capsule endoscopy if further melena   Present:  Weight stable today at 135lbs, was 136 in march  States appetite is better. Denies any GI symptoms. No further melena. He states that when he had a bag after his prostate issues he thinks this contributed to his weight loss. Denies rectal bleeding. No constipation or diarrhea.   Recent Iron studies in April, hgb 11.3 with TIBC 251, iron 75, sat 30%, ferritin 207  Currently wearing a heart monitor due to palpitations.   CT without contrast 07/2023 IMPRESSION: 1. No noncontrast CT evidence of acute traumatic injury to the chest, abdomen, or pelvis. 2. Asymmetric fibrosis of the right lung base with areas of subpleural bronchiolectasis and honeycombing. 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 Consider pulmonary referral and dedicated interstitial lung disease CT on a nonemergent, outpatient basis if and when clinically appropriate. 3. Emphysema. 4. Coronary artery disease. 5. Severe prostatomegaly. 6. Probable septated parapelvic right renal cyst, incompletely characterized. Recommend nonemergent renal ultrasound to confirm cystic character. EGD 07/2023 - No endoscopic  esophageal abnormality to explain patient' s dysphagia. Esophagus dilated. Dilated. - Erythematous mucosa in the antrum. Biopsied.:Normal duodenum  Colonoscopy 07/2023 - One 4 mm polyp at the hepatic flexure, removed with a cold snare. Resected and retrieved. - Diverticulosis in the left colon. - Non- bleeding external and internal hemorrhoids. - Melanosis in the colon.   A. STOMACH, RANDOM, BIOPSY:       Gastric antral / oxyntic mucosa without significant diagnostic  alteration.      No H. pylori identified on HE stain.       Negative for intestinal metaplasia or dysplasia.    B. HEPATIC FLEXURE, POLYPECTOMY:       Tubular adenoma.       Negative for high-grade dysplasia.    Filed Weights   10/21/23 0842  Weight: 135 lb 1.6 oz (61.3 kg)     Past Medical History:  Diagnosis Date   Angina    CAD (coronary artery disease)    moderate, by cath   Diabetes mellitus    DJD (degenerative joint disease)    lumbar lam 4/05   Dyslipidemia    Dysrhythmia    "palpitation", PVCs   GERD (gastroesophageal reflux disease)    Hypertension    Palpitations    PUD (peptic ulcer disease)    endo 03/15/10    Past Surgical History:  Procedure Laterality Date   BACK SURGERY  2005   BIOPSY  07/18/2023   Procedure: BIOPSY;  Surgeon: Hargis Lias, MD;  Location: [facility];  Service: Endoscopy;;   CARDIAC CATHETERIZATION  02/22/2006   mild obs. CAD 60-70% narrowing in small inferior branch of  1st diagonal of LAD, smooth 10% narrowing of mid RCA (Dr. 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 Savilla Curls)   CARDIAC CATHETERIZATION  05/11/2011   nonobstructive CAD (Dr. Truddie Furrow)   COLONOSCOPY N/A 06/13/2016   Procedure: COLONOSCOPY;  Surgeon: Ruby Corporal, MD;  Location: [facility];  Service: Endoscopy;  Laterality: N/A;  1200   COLONOSCOPY WITH PROPOFOL  N/A 07/18/2023   Procedure: COLONOSCOPY WITH  PROPOFOL ;  Surgeon: Hargis Lias, MD;  Location: [facility];  Service: Endoscopy;  Laterality: N/A;  10:45AM; ASA 3   ESOPHAGOGASTRODUODENOSCOPY (EGD) WITH PROPOFOL  N/A 07/18/2023   Procedure: ESOPHAGOGASTRODUODENOSCOPY (EGD) WITH PROPOFOL ;  Surgeon: Hargis Lias, MD;  Location: [facility];  Service: Endoscopy;  Laterality: N/A;  10:45AM;ASA 3   HERNIA REPAIR     INGUINAL HERNIA REPAIR  1982   right   LEFT HEART CATHETERIZATION WITH CORONARY ANGIOGRAM N/A 05/14/2011   Procedure: LEFT HEART CATHETERIZATION WITH CORONARY ANGIOGRAM;  Surgeon: Arleen Lacer, MD;  Location: [facility] CATH LAB;  Service: Cardiovascular;  Laterality: N/A;  Coronary angiogram and possible PCI   LUMBAR LAMINECTOMY  09/2003   L2-3, 3-4, 4-5   NM MYOCAR PERF WALL MOTION  10/03/2009   bruce myoview  - normal perfusion in all regions, EF 70%, no ischemia, low risk   POLYPECTOMY  07/18/2023   Procedure: POLYPECTOMY;  Surgeon: Hargis Lias, MD;  [facility];  Service: Endoscopy;;   SAVORY DILATION  07/18/2023   Procedure: SAVORY DILATION;  Surgeon: Hargis Lias, MD;  [facility];  Service: Endoscopy;;   TRANSTHORACIC ECHOCARDIOGRAM  03/28/2012   EF 65-70%, grade 1 diastolic dysfunction, mod AV calcification, mild MR   XI ROBOTIC ASSISTED INGUINAL HERNIA REPAIR WITH MESH Left 12/20/2022   Procedure: XI ROBOTIC ASSISTED INGUINAL HERNIA REPAIR WITH MESH;  Surgeon: Alanda Allegra, MD;  Location: [facility];  Service: General;  Laterality: Left;    Current Outpatient Medications  Medication Sig Dispense Refill   acetaminophen  (TYLENOL ) 500 MG tablet Take 1 tablet (500 mg total) by mouth every 6 (six) hours as needed. (Patient taking differently: Take 500 mg by mouth every 6 (six) hours as needed for mild pain (pain score 1-3).) 30 tablet 0   amiodarone  (PACERONE ) 200 MG tablet Take 1 tablet (200 mg total) by mouth daily. Monday-Friday. Do not take on Saturday or Sunday. 90 tablet 3   aspirin  EC  81 MG tablet Take 1 tablet (81 mg total) by mouth daily with breakfast. Swallow whole. 90 tablet 2   atorvastatin  (LIPITOR) 40 MG tablet Take 1 tablet (40 mg total) by mouth daily. 90 tablet 3   cetirizine (ZYRTEC) 10 MG tablet Take 10 mg by mouth daily as needed for allergies.     finasteride  (PROSCAR ) 5 MG tablet Take 1 tablet (5 mg total) by mouth daily. 90 tablet 3   fluticasone  (FLONASE ) 50 MCG/ACT nasal spray Place 1 spray into both nostrils as needed for allergies or rhinitis.   11   glipiZIDE  (GLUCOTROL  XL) 5 MG 24 hr tablet Take 10 mg by mouth daily.     isosorbide  mononitrate (IMDUR ) 30 MG 24 hr tablet Take 15 mg by mouth daily.     meclizine (ANTIVERT) 25 MG tablet Take 25 mg by mouth 3 (three) times daily as needed for dizziness.     metoprolol  tartrate (LOPRESSOR ) 25 MG tablet Take 1 tablet (25  mg total) by mouth 2 (two) times daily. (Patient taking differently: Take 25 mg by mouth daily.) 180 tablet 3   nitroGLYCERIN  (NITROSTAT ) 0.4 MG SL tablet Place 1 tablet (0.4 mg total) under the tongue every 5 (five) minutes as needed for chest pain (severe chest pain or pressure only). 25 tablet 3   NOVOLIN N 100 UNIT/ML injection Inject 10 Units into the skin 2 (two) times daily as needed (If Blood Sugar gets above 100).     ONETOUCH ULTRA test strip USE 1 STRIP TO CHECK BLOOD GLUCOSE LEVEL TWICE DAILY.     pantoprazole  (PROTONIX ) 40 MG tablet Take 1 tablet (40 mg total) by mouth 2 (two) times daily. 60 tablet 2   Polyethylene Glycol 400 (BLINK TEARS OP) Place 1 drop into both eyes 2 (two) times daily.     silodosin  (RAPAFLO ) 8 MG CAPS capsule Take 1 capsule (8 mg total) by mouth at bedtime. 90 capsule 3   No current facility-administered medications for this visit.    Allergies as of 10/21/2023 - Review Complete 10/21/2023  Allergen Reaction Noted   Lisinopril  Other (See Comments) 10/21/2014   Sulfa antibiotics Hives 05/11/2011   Sulfacetamide Hives 04/17/2018   Vioxx [rofecoxib] Hives  05/11/2011   Yellow jacket venom [bee venom] Swelling 03/27/2012    Social History   Socioeconomic History   Marital status: Married    Spouse name: Not on file   Number of children: 4   Years of education: 12   Highest education level: Not on file  Occupational History   Occupation: retired  Tobacco Use   Smoking status: Former    Current packs/day: 1.00    Average packs/day: 1 pack/day for 25.0 years (25.0 ttl pk-yrs)    Types: Cigarettes    Passive exposure: Past   Smokeless tobacco: Never   Tobacco comments:    quit smoking cigarettes ~ 1990  Vaping Use   Vaping status: Never Used  Substance and Sexual Activity   Alcohol  use: Not Currently    Comment: very seldom   Drug use: No   Sexual activity: Yes  Other Topics Concern   Not on file  Social History Narrative   Not on file   Social Drivers of Health   Financial Resource Strain: Not on file  Food Insecurity: No Food Insecurity (01/27/2023)   Hunger Vital Sign    Worried About Running Out of Food in the Last Year: Never true    Ran Out of Food in the Last Year: Never true  Transportation Needs: No Transportation Needs (01/27/2023)   PRAPARE - Administrator, Civil Service (Medical): No    Lack of Transportation (Non-Medical): No  Physical Activity: Not on file  Stress: Not on file  Social Connections: Not on file    Review of systems General: negative for malaise, night sweats, fever, chills, weight loss Neck: Negative for lumps, goiter, pain and significant neck swelling Resp: Negative for cough, wheezing, dyspnea at rest CV: Negative for chest pain, leg swelling, palpitations, orthopnea +palpitations  GI: denies melena, hematochezia, nausea, vomiting, diarrhea, constipation, dysphagia, odyonophagia, early satiety or unintentional weight loss.  The remainder of the review of systems is noncontributory.  Physical Exam: BP 133/79 (BP Location: Right Arm, Patient Position: Sitting, Cuff Size:  Normal)   Pulse 66   Temp (!) 97.5 F (36.4 C) (Temporal)   Ht 5\' 8"  (1.727 m)   Wt 135 lb 1.6 oz (61.3 kg)   BMI 20.54 kg/m  General:   Alert and oriented. No distress noted. Pleasant and cooperative.  Head:  Normocephalic and atraumatic. Eyes:  Conjuctiva clear without scleral icterus. Mouth:  Oral mucosa pink and moist. Good dentition. No lesions. Heart: Normal rate and rhythm, s1 and s2 heart sounds present.  Lungs: Clear lung sounds in all lobes. Respirations equal and unlabored. Abdomen:  +BS, soft, non-tender and non-distended. No rebound or guarding. No HSM or masses noted. Neurologic:  Alert and  oriented x4 Psych:  Alert and cooperative. Normal mood and affect.  Invalid input(s): "6 MONTHS"   ASSESSMENT: Angel Dixon is a 83 y.o. male presenting today for follow up of anemia and weight loss  Normocytic anemia, previously with melena that subsided , recent EGD and Colonoscopy without findings to explain symptoms. Hgb remains mildly low but most recent iron studies in April were WNL. Consider capsule endoscopy if melena recurs.   Weight is stable around 135-136. Feels appetite has improved. He has upcoming CT C/A/P with contrast next month for further evaluation.    PLAN:  -keep appt for CT in June -consider capsule endoscopy if melena recurs  All questions were answered, patient verbalized understanding and is in agreement with plan as outlined above.   Follow Up: 6 months   Angel Shader L. Jaykub Mackins, MSN, APRN, AGNP-C Adult-Gerontology Nurse Practitioner [facility]

## 2023-10-21 NOTE — Patient Instructions (Signed)
 Reassuringly your iron levels last month looked good! We will await your upcoming CT and be in touch once we have these results  Follow up 6 months

## 2023-10-31 DIAGNOSIS — I493 Ventricular premature depolarization: Secondary | ICD-10-CM

## 2023-11-06 ENCOUNTER — Other Ambulatory Visit: Payer: Self-pay

## 2023-11-06 ENCOUNTER — Emergency Department (HOSPITAL_COMMUNITY)
Admission: EM | Admit: 2023-11-06 | Discharge: 2023-11-06 | Disposition: A | Discharge status: Home / Self Care | Attending: Emergency Medicine | Admitting: Emergency Medicine

## 2023-11-06 ENCOUNTER — Encounter (HOSPITAL_COMMUNITY): Payer: Self-pay

## 2023-11-06 DIAGNOSIS — I10 Essential (primary) hypertension: Secondary | ICD-10-CM | POA: Diagnosis not present

## 2023-11-06 DIAGNOSIS — E119 Type 2 diabetes mellitus without complications: Secondary | ICD-10-CM | POA: Insufficient documentation

## 2023-11-06 DIAGNOSIS — Z79899 Other long term (current) drug therapy: Secondary | ICD-10-CM | POA: Insufficient documentation

## 2023-11-06 DIAGNOSIS — Z7982 Long term (current) use of aspirin: Secondary | ICD-10-CM | POA: Insufficient documentation

## 2023-11-06 DIAGNOSIS — E871 Hypo-osmolality and hyponatremia: Secondary | ICD-10-CM | POA: Diagnosis not present

## 2023-11-06 DIAGNOSIS — Z7984 Long term (current) use of oral hypoglycemic drugs: Secondary | ICD-10-CM | POA: Diagnosis not present

## 2023-11-06 DIAGNOSIS — I2510 Atherosclerotic heart disease of native coronary artery without angina pectoris: Secondary | ICD-10-CM | POA: Insufficient documentation

## 2023-11-06 DIAGNOSIS — R338 Other retention of urine: Secondary | ICD-10-CM | POA: Diagnosis present

## 2023-11-06 HISTORY — DX: Benign prostatic hyperplasia without lower urinary tract symptoms: N40.0

## 2023-11-06 LAB — CBC WITH DIFFERENTIAL/PLATELET
Abs Immature Granulocytes: 0.01 10*3/uL (ref 0.00–0.07)
Basophils Absolute: 0 10*3/uL (ref 0.0–0.1)
Basophils Relative: 0 %
Eosinophils Absolute: 0 10*3/uL (ref 0.0–0.5)
Eosinophils Relative: 0 %
HCT: 36.5 % — ABNORMAL LOW (ref 39.0–52.0)
Hemoglobin: 12.1 g/dL — ABNORMAL LOW (ref 13.0–17.0)
Immature Granulocytes: 0 %
Lymphocytes Relative: 5 %
Lymphs Abs: 0.4 10*3/uL — ABNORMAL LOW (ref 0.7–4.0)
MCH: 28.3 pg (ref 26.0–34.0)
MCHC: 33.2 g/dL (ref 30.0–36.0)
MCV: 85.3 fL (ref 80.0–100.0)
Monocytes Absolute: 0.5 10*3/uL (ref 0.1–1.0)
Monocytes Relative: 7 %
Neutro Abs: 6.2 10*3/uL (ref 1.7–7.7)
Neutrophils Relative %: 88 %
Platelets: 300 10*3/uL (ref 150–400)
RBC: 4.28 MIL/uL (ref 4.22–5.81)
RDW: 13.5 % (ref 11.5–15.5)
WBC: 7.1 10*3/uL (ref 4.0–10.5)
nRBC: 0 % (ref 0.0–0.2)

## 2023-11-06 LAB — BASIC METABOLIC PANEL WITH GFR
Anion gap: 10 (ref 5–15)
BUN: 12 mg/dL (ref 8–23)
CO2: 25 mmol/L (ref 22–32)
Calcium: 8.8 mg/dL — ABNORMAL LOW (ref 8.9–10.3)
Chloride: 99 mmol/L (ref 98–111)
Creatinine, Ser: 1.14 mg/dL (ref 0.61–1.24)
GFR, Estimated: >60 mL/min (ref >60)
Glucose, Bld: 199 mg/dL — ABNORMAL HIGH (ref 70–99)
Potassium: 3.8 mmol/L (ref 3.5–5.1)
Sodium: 134 mmol/L — ABNORMAL LOW (ref 135–145)

## 2023-11-06 LAB — URINALYSIS, W/ REFLEX TO CULTURE (INFECTION SUSPECTED)
Bilirubin Urine: NEGATIVE
Glucose, UA: NEGATIVE mg/dL
Hgb urine dipstick: NEGATIVE
Ketones, ur: NEGATIVE mg/dL
Leukocytes,Ua: NEGATIVE
Nitrite: NEGATIVE
Protein, ur: NEGATIVE mg/dL
Specific Gravity, Urine: 1.005 (ref 1.005–1.030)
pH: 6 (ref 5.0–8.0)

## 2023-11-06 NOTE — ED Provider Notes (Signed)
 [place] EMERGENCY DEPARTMENT AT [facility] Provider Note   CSN: [id number] Arrival date & time: 11/06/23  1126     History  Chief Complaint  Patient presents with   Urinary Retention    Angel Dixon is a 83 y.o. male.  HPI      Angel Dixon is a 83 y.o. male with past medical history of coronary artery disease, BPH, type 2 diabetes, hypertension who presents to the Emergency Department complaining of acute urinary retention since around 1 AM this morning.  Has history of BPH, followed by urology.  Had Foley catheter up until 2 weeks ago when Foley was removed.  He was having difficulty urinating yesterday, did not feel that he was completely emptying his bladder.  Woke this morning around 1 AM with pressure sensation of his lower abdomen and unable to void.  Denies any flank pain or hematuria.    Home Medications Prior to Admission medications   Medication Sig Start Date End Date Taking? Authorizing Provider  amiodarone  (PACERONE ) 200 MG tablet Take 1 tablet (200 mg total) by mouth daily. Monday-Friday. Do not take on Saturday or Sunday. 10/15/23  Yes Tammie Fall, MD  finasteride  (PROSCAR ) 5 MG tablet Take 1 tablet (5 mg total) by mouth daily. 08/28/23  Yes McKenzie, Arden Beck, MD  glipiZIDE  (GLUCOTROL  XL) 5 MG 24 hr tablet Take 10 mg by mouth daily. 12/16/22  Yes [provider]  silodosin  (RAPAFLO ) 8 MG CAPS capsule Take 1 capsule (8 mg total) by mouth at bedtime. 08/28/23  Yes McKenzie, Arden Beck, MD  acetaminophen  (TYLENOL ) 500 MG tablet Take 1 tablet (500 mg total) by mouth every 6 (six) hours as needed. Patient taking differently: Take 500 mg by mouth every 6 (six) hours as needed for mild pain (pain score 1-3). 01/26/23   Debbra Fairy, PA-C  aspirin  EC 81 MG tablet Take 1 tablet (81 mg total) by mouth daily with breakfast. Swallow whole. 11/06/22   Colin Dawley, MD  atorvastatin  (LIPITOR) 40 MG tablet Take 1 tablet (40 mg total) by mouth  daily. 11/06/22   Colin Dawley, MD  cetirizine (ZYRTEC) 10 MG tablet Take 10 mg by mouth daily as needed for allergies.    [facility], [facility]  fluticasone  (FLONASE ) 50 MCG/ACT nasal spray Place 1 spray into both nostrils as needed for allergies or rhinitis.  09/20/14   [provider]  isosorbide  mononitrate (IMDUR ) 30 MG 24 hr tablet Take 15 mg by mouth daily. 02/10/23   [provider]  meclizine (ANTIVERT) 25 MG tablet Take 25 mg by mouth 3 (three) times daily as needed for dizziness. 11/27/22   [provider]  metoprolol  tartrate (LOPRESSOR ) 25 MG tablet Take 1 tablet (25 mg total) by mouth 2 (two) times daily. Patient taking differently: Take 25 mg by mouth daily. 11/06/22   Colin Dawley, MD  nitroGLYCERIN  (NITROSTAT ) 0.4 MG SL tablet Place 1 tablet (0.4 mg total) under the tongue every 5 (five) minutes as needed for chest pain (severe chest pain or pressure only). 08/12/20 05/19/29  Hilty, Aviva Lemmings, MD  NOVOLIN N 100 UNIT/ML injection Inject 10 Units into the skin 2 (two) times daily as needed (If Blood Sugar gets above 100). 10/17/22   [provider]  ONETOUCH ULTRA test strip USE 1 STRIP TO CHECK BLOOD GLUCOSE LEVEL TWICE DAILY. 11/22/20   [provider]  pantoprazole  (PROTONIX ) 40 MG tablet Take 1 tablet (40 mg total) by mouth 2 (two) times  daily. 11/06/22   Colin Dawley, MD  Polyethylene Glycol 400 (BLINK TEARS OP) Place 1 drop into both eyes 2 (two) times daily.    [provider]  trospium  (SANCTURA ) 20 MG tablet Take 1 tablet by mouth 2 (two) times daily.    [provider]      Allergies    Lisinopril , Sulfa antibiotics, Sulfacetamide, Vioxx [rofecoxib], and Yellow jacket venom [bee venom]    Review of Systems   Review of Systems  Constitutional:  Negative for chills and fever.  Respiratory:  Negative for shortness of breath.   Cardiovascular:  Negative for chest pain.  Gastrointestinal:  Negative for  abdominal pain, nausea and vomiting.  Genitourinary:  Positive for decreased urine volume and difficulty urinating. Negative for flank pain, hematuria, penile discharge, penile pain, penile swelling, scrotal swelling and testicular pain.  Neurological:  Negative for dizziness, weakness and numbness.    Physical Exam Updated Vital Signs BP (!) 158/115 (BP Location: Right Arm)   Pulse 68   Temp 98.1 F (36.7 C) (Oral)   Resp 18   Ht 5\' 8"  (1.727 m)   Wt 61.3 kg   SpO2 96%   BMI 20.54 kg/m  Physical Exam Vitals and nursing note reviewed.  Constitutional:      General: He is not in acute distress.    Appearance: Normal appearance.  HENT:     Mouth/Throat:     Mouth: Mucous membranes are moist.  Cardiovascular:     Rate and Rhythm: Normal rate and regular rhythm.     Pulses: Normal pulses.  Pulmonary:     Effort: Pulmonary effort is normal.     Breath sounds: Normal breath sounds.  Abdominal:     General: There is no distension.     Palpations: Abdomen is soft.     Tenderness: There is no abdominal tenderness.     Comments: Abdomen soft nontender on my exam no abdominal distention, Foley catheter was placed prior to my exam no CVA tenderness  Musculoskeletal:        General: Normal range of motion.     Right lower leg: No edema.     Left lower leg: No edema.  Skin:    General: Skin is warm.     Capillary Refill: Capillary refill takes less than 2 seconds.  Neurological:     General: No focal deficit present.     Mental Status: He is alert.     Sensory: No sensory deficit.     Motor: No weakness.     ED Results / Procedures / Treatments   Labs (all labs ordered are listed, but only abnormal results are displayed) Labs Reviewed  URINALYSIS, W/ REFLEX TO CULTURE (INFECTION SUSPECTED) - Abnormal; Notable for the following components:      Result Value   Color, Urine STRAW (*)    Bacteria, UA RARE (*)    All other components within normal limits  CBC WITH  DIFFERENTIAL/PLATELET  BASIC METABOLIC PANEL WITH GFR    EKG None  Radiology No results found.  Procedures Procedures    Medications Ordered in ED Medications - No data to display  ED Course/ Medical Decision Making/ A&P                                 Medical Decision Making Patient here with acute urinary retention, history of BPH currently being followed by urology had Foley catheter  in place until 2 weeks ago when Foley catheter was removed.  Difficulty voiding yesterday, voiding small amounts.  Unable to void at all since 1 AM this morning.  Acute urinary retention likely secondary to enlarged prostate, infection, neoplasm also considered.   Bladder scan shows 1228 cc urine, plan to place Foley catheter   Amount and/or Complexity of Data Reviewed Labs: ordered.    Details: No evidence of leukocytosis, chemistries blood sugar elevated bicarb and ion gap reassuring.  Has not taken his medications this afternoon.  Urinalysis without evidence of infection Discussion of management or test interpretation with external provider(s): Patient endorses immediate relief of his symptoms after placement of Foley catheter.  Coud catheter placed by nursing staff without difficulty.  He has had approximately 1000 cc of urine output without gross hematuria or clots.  Will leave Foley catheter in place and patient will follow-up closely with his urologist.  Denies any symptoms at this time states he is ready for discharge home.  Vital signs are reassuring.           Final Clinical Impression(s) / ED Diagnoses Final diagnoses:  Acute urinary retention    Rx / DC Orders ED Discharge Orders     None         Catherne Clubs, PA-C 11/06/23 1425    Jerilynn Montenegro, MD 11/07/23 [phone]

## 2023-11-06 NOTE — ED Triage Notes (Signed)
 Pt arrived via POV from home c/o urinary retention since yesterday. Pt endorses lower abdominal pain. Pt denies hematuria.

## 2023-11-06 NOTE — ED Notes (Signed)
 Pt bladder scan with the largest volume of . Coude Foley has been placed by a verbal order and inserted by [place] and this Clinical research associate assisted.

## 2023-11-06 NOTE — ED Notes (Signed)
 Pt would like to keep standard drainage bag.

## 2023-11-06 NOTE — Discharge Instructions (Addendum)
 Keep the Foley catheter in place, call your urologist to arrange follow-up appointment.

## 2023-11-07 ENCOUNTER — Ambulatory Visit: Payer: Self-pay | Admitting: Internal Medicine

## 2023-11-11 ENCOUNTER — Emergency Department (HOSPITAL_COMMUNITY)
Admission: EM | Admit: 2023-11-11 | Discharge: 2023-11-11 | Disposition: A | Discharge status: Home / Self Care | Attending: Emergency Medicine | Admitting: Emergency Medicine

## 2023-11-11 ENCOUNTER — Encounter (HOSPITAL_COMMUNITY): Payer: Self-pay

## 2023-11-11 ENCOUNTER — Other Ambulatory Visit: Payer: Self-pay

## 2023-11-11 DIAGNOSIS — Z794 Long term (current) use of insulin: Secondary | ICD-10-CM | POA: Diagnosis not present

## 2023-11-11 DIAGNOSIS — Z7982 Long term (current) use of aspirin: Secondary | ICD-10-CM | POA: Insufficient documentation

## 2023-11-11 DIAGNOSIS — T83098A Other mechanical complication of other indwelling urethral catheter, initial encounter: Secondary | ICD-10-CM | POA: Diagnosis present

## 2023-11-11 DIAGNOSIS — Y732 Prosthetic and other implants, materials and accessory gastroenterology and urology devices associated with adverse incidents: Secondary | ICD-10-CM | POA: Diagnosis not present

## 2023-11-11 DIAGNOSIS — T839XXA Unspecified complication of genitourinary prosthetic device, implant and graft, initial encounter: Secondary | ICD-10-CM

## 2023-11-11 NOTE — ED Provider Notes (Signed)
 [place] EMERGENCY DEPARTMENT AT [facility] Provider Note   CSN: [id number] Arrival date & time: 11/11/23  1358     History  Chief Complaint  Patient presents with   foley catheter problem    Angel Dixon is a 83 y.o. male.  HPI     Angel Dixon is a 83 y.o. male who presents to the Emergency Department requesting evaluation of his Foley catheter.  Foley catheter was placed here 5 days ago for acute urinary retention.  He has upcoming appointment with urology.  He states he was trying to adjust the tubing when the catheter was pulled from the insertion site of his penis.  He states that he began leaking urine at his penis.  He denies any bleeding or pain.  Urine is still flowing into the Foley catheter bag.  He denies seeing any cloudy urine, blood or blood clots.  No flank pain, fever or chills.  Home Medications Prior to Admission medications   Medication Sig Start Date End Date Taking? Authorizing Provider  acetaminophen  (TYLENOL ) 500 MG tablet Take 1 tablet (500 mg total) by mouth every 6 (six) hours as needed. Patient taking differently: Take 500 mg by mouth every 6 (six) hours as needed for mild pain (pain score 1-3). 01/26/23   Debbra Fairy, PA-C  amiodarone  (PACERONE ) 200 MG tablet Take 1 tablet (200 mg total) by mouth daily. Monday-Friday. Do not take on Saturday or Sunday. 10/15/23   Tammie Fall, MD  aspirin  EC 81 MG tablet Take 1 tablet (81 mg total) by mouth daily with breakfast. Swallow whole. 11/06/22   Colin Dawley, MD  atorvastatin  (LIPITOR) 40 MG tablet Take 1 tablet (40 mg total) by mouth daily. 11/06/22   Colin Dawley, MD  cetirizine (ZYRTEC) 10 MG tablet Take 10 mg by mouth daily as needed for allergies.    [facility], [facility]  finasteride  (PROSCAR ) 5 MG tablet Take 1 tablet (5 mg total) by mouth daily. 08/28/23   McKenzie, Arden Beck, MD  fluticasone  (FLONASE ) 50 MCG/ACT nasal spray Place 1 spray into both nostrils as needed for  allergies or rhinitis.  09/20/14   [provider]  glipiZIDE  (GLUCOTROL  XL) 5 MG 24 hr tablet Take 10 mg by mouth daily. 12/16/22   [provider]  isosorbide  mononitrate (IMDUR ) 30 MG 24 hr tablet Take 15 mg by mouth daily. 02/10/23   [provider]  meclizine (ANTIVERT) 25 MG tablet Take 25 mg by mouth 3 (three) times daily as needed for dizziness. 11/27/22   [provider]  metoprolol  tartrate (LOPRESSOR ) 25 MG tablet Take 1 tablet (25 mg total) by mouth 2 (two) times daily. Patient taking differently: Take 25 mg by mouth daily. 11/06/22   Colin Dawley, MD  nitroGLYCERIN  (NITROSTAT ) 0.4 MG SL tablet Place 1 tablet (0.4 mg total) under the tongue every 5 (five) minutes as needed for chest pain (severe chest pain or pressure only). 08/12/20 05/19/29  Hilty, Aviva Lemmings, MD  NOVOLIN N 100 UNIT/ML injection Inject 10 Units into the skin 2 (two) times daily as needed (If Blood Sugar gets above 100). 10/17/22   [provider]  ONETOUCH ULTRA test strip USE 1 STRIP TO CHECK BLOOD GLUCOSE LEVEL TWICE DAILY. 11/22/20   [provider]  pantoprazole  (PROTONIX ) 40 MG tablet Take 1 tablet (40 mg total) by mouth 2 (two) times daily. 11/06/22   Colin Dawley, MD  Polyethylene Glycol 400 (BLINK TEARS OP) Place 1 drop into both  eyes 2 (two) times daily.    [provider]  silodosin  (RAPAFLO ) 8 MG CAPS capsule Take 1 capsule (8 mg total) by mouth at bedtime. 08/28/23   McKenzie, Arden Beck, MD  trospium  (SANCTURA ) 20 MG tablet Take 1 tablet by mouth 2 (two) times daily.    [provider]      Allergies    Lisinopril , Sulfa antibiotics, Sulfacetamide, Vioxx [rofecoxib], and Yellow jacket venom [bee venom]    Review of Systems   Review of Systems  Constitutional:  Negative for chills and fever.  Respiratory:  Negative for cough and shortness of breath.   Cardiovascular:  Negative for chest pain.  Gastrointestinal:  Negative for abdominal  pain, diarrhea, nausea and vomiting.  Genitourinary:  Negative for decreased urine volume, difficulty urinating, flank pain, hematuria, penile pain, penile swelling and scrotal swelling.  Musculoskeletal:  Negative for back pain.  Neurological:  Negative for dizziness, weakness and headaches.  Psychiatric/Behavioral:  Negative for confusion.     Physical Exam Updated Vital Signs BP (!) 154/94 (BP Location: Right Arm)   Pulse 89   Temp 98 F (36.7 C) (Oral)   Resp 17   Ht 5\' 8"  (1.727 m)   Wt 61.3 kg   SpO2 98%   BMI 20.54 kg/m  Physical Exam Vitals and nursing note reviewed.  Constitutional:      General: He is not in acute distress.    Appearance: Normal appearance. He is not ill-appearing or toxic-appearing.  Cardiovascular:     Rate and Rhythm: Normal rate and regular rhythm.     Pulses: Normal pulses.  Pulmonary:     Effort: Pulmonary effort is normal.     Breath sounds: Normal breath sounds.  Abdominal:     Palpations: Abdomen is soft.     Tenderness: There is no abdominal tenderness.  Genitourinary:    Penis: Normal.      Comments: Foley catheter in place.  Draining light yellow urine into the Foley catheter bag.  No hematuria or clots.  I do not see any leaking from the Foley catheter at this time. Musculoskeletal:        General: Normal range of motion.  Skin:    General: Skin is warm.     Capillary Refill: Capillary refill takes less than 2 seconds.  Neurological:     General: No focal deficit present.     Mental Status: He is alert.     Sensory: No sensory deficit.     Motor: No weakness.     ED Results / Procedures / Treatments   Labs (all labs ordered are listed, but only abnormal results are displayed) Labs Reviewed  URINALYSIS, ROUTINE W REFLEX MICROSCOPIC    EKG None  Radiology No results found.  Procedures Procedures    Medications Ordered in ED Medications - No data to display  ED Course/ Medical Decision Making/ A&P                                  Medical Decision Making Patient here for evaluation of leaking from his Foley catheter.  Foley was placed here 5 days ago for acute urinary retention.  He has upcoming appointment with urology.  He states he was adjusting the tubing of his Foley catheter when the tubing was accidentally pulled on causing leaking from his penis.  He denies any pain bloody urine or clots.  No fever or chills  or abdominal pain.  Foley catheter appears to be in place.  I do not appreciate any leaking at this time.  Leaking may be from dislodgment of the Foley catheter.  Less likely sediment or clotting.  Doubt UTI. Pt is well appearing  Amount and/or Complexity of Data Reviewed Labs: ordered. Discussion of management or test interpretation with external provider(s): Patient had reassuring urinalysis 5 days ago  Foley catheter was irrigated without difficulty by nursing staff.  Catheter tubing was readjusted, no leaking of urine.  Patient stands still no leaking states feels better.  Tubing was also taped to his leg urine draining into the Foley catheter bag without difficulty.  No symptoms suggestive of UTI at this time.  Patient does have upcoming appointment with urology.  Do not feel that repeat urinalysis is indicated at this time.             Final Clinical Impression(s) / ED Diagnoses Final diagnoses:  Complication of Foley catheter, initial encounter [facility])    Rx / DC Orders ED Discharge Orders     None         Mitzie Anda 11/11/23 1802    Early Glisson, MD 11/12/23 [phone]

## 2023-11-11 NOTE — ED Triage Notes (Signed)
 Pt arrived via pOV c/o urine leaking around his foley catheter out from the end of his urethra. Pt reports this began yesterday. Pt reports he was here 5 days ago and had the foley catheter placed due acute urinary retention. Denies hematuria.

## 2023-11-11 NOTE — Discharge Instructions (Signed)
 You may keep the tubing taped as needed if it feels more secure.  Please keep your upcoming appointment with the urologist.  Return to the emergency department if you develop any new or worsening symptoms.

## 2023-11-15 ENCOUNTER — Ambulatory Visit (INDEPENDENT_AMBULATORY_CARE_PROVIDER_SITE_OTHER)

## 2023-11-15 ENCOUNTER — Telehealth: Payer: Self-pay

## 2023-11-15 DIAGNOSIS — N401 Enlarged prostate with lower urinary tract symptoms: Secondary | ICD-10-CM | POA: Diagnosis not present

## 2023-11-15 DIAGNOSIS — N138 Other obstructive and reflux uropathy: Secondary | ICD-10-CM | POA: Diagnosis not present

## 2023-11-15 NOTE — Telephone Encounter (Signed)
 Patient called and states his cath bag is leaking.  Patient also states that he is voiding around the catheter. Patient is put on nurse visited scheduled assess catheter. Patient voiced understanding.Aaron Aas

## 2023-11-15 NOTE — Progress Notes (Signed)
 Patient came in today for catheter bag change due to bag leaking. Patient also states that he is voiding around the catheter. Patient is made aware per Dr. Claretta Croft to increase Rapaflo  to BID. Per Dr. Claretta Croft irrigated patient catheter.

## 2023-11-19 ENCOUNTER — Ambulatory Visit (HOSPITAL_COMMUNITY)
Admission: RE | Admit: 2023-11-19 | Discharge: 2023-11-19 | Disposition: A | Discharge status: Home / Self Care | Source: Ambulatory Visit | Attending: Gastroenterology | Admitting: Gastroenterology

## 2023-11-19 DIAGNOSIS — K8020 Calculus of gallbladder without cholecystitis without obstruction: Secondary | ICD-10-CM | POA: Diagnosis not present

## 2023-11-19 DIAGNOSIS — J479 Bronchiectasis, uncomplicated: Secondary | ICD-10-CM | POA: Diagnosis not present

## 2023-11-19 DIAGNOSIS — N400 Enlarged prostate without lower urinary tract symptoms: Secondary | ICD-10-CM | POA: Diagnosis not present

## 2023-11-19 DIAGNOSIS — I2510 Atherosclerotic heart disease of native coronary artery without angina pectoris: Secondary | ICD-10-CM | POA: Diagnosis not present

## 2023-11-19 DIAGNOSIS — R634 Abnormal weight loss: Secondary | ICD-10-CM | POA: Diagnosis present

## 2023-11-19 DIAGNOSIS — J439 Emphysema, unspecified: Secondary | ICD-10-CM | POA: Insufficient documentation

## 2023-11-19 MED ORDER — IOHEXOL 300 MG/ML  SOLN
100.0000 mL | Freq: Once | INTRAMUSCULAR | Status: AC | PRN
  Administered 2023-11-19: 100 mL via INTRAVENOUS

## 2023-11-20 ENCOUNTER — Ambulatory Visit (INDEPENDENT_AMBULATORY_CARE_PROVIDER_SITE_OTHER): Payer: Self-pay | Admitting: Gastroenterology

## 2023-11-25 ENCOUNTER — Encounter: Payer: Self-pay | Admitting: Urology

## 2023-11-25 ENCOUNTER — Ambulatory Visit: Admitting: Urology

## 2023-11-25 VITALS — BP 148/81 | HR 91

## 2023-11-25 DIAGNOSIS — N401 Enlarged prostate with lower urinary tract symptoms: Secondary | ICD-10-CM

## 2023-11-25 MED ORDER — CIPROFLOXACIN HCL 500 MG PO TABS
500.0000 mg | ORAL_TABLET | Freq: Once | ORAL | Status: AC
  Administered 2023-11-25: 500 mg via ORAL

## 2023-11-25 NOTE — Patient Instructions (Signed)

## 2023-11-25 NOTE — Progress Notes (Signed)
   11/25/23  CC: urinary retention   HPI: [name] is a 83yo here for cystoscopy for urinary retention Blood pressure (!) 148/81, pulse 91. NED. A&Ox3.   No respiratory distress   Abd soft, NT, ND Normal phallus with bilateral descended testicles  Cystoscopy Procedure Note  Patient identification was confirmed, informed consent was obtained, and patient was prepped using Betadine solution.  Lidocaine  jelly was administered per urethral meatus.     Pre-Procedure: - Inspection reveals a normal caliber ureteral meatus.  Procedure: The flexible cystoscope was introduced without difficulty - No urethral strictures/lesions are present. - Enlarged prostate no median - Normal bladder neck - Bilateral ureteral orifices identified - Bladder mucosa  reveals no ulcers, tumors, or lesions - No bladder stones - No trabeculation     Post-Procedure: - Patient tolerated the procedure well  Assessment/ Plan: We discussed the management of his BPH including continued medical therapy, Rezum, Urolift, TURP and simple prostatectomy. After discussing the options the patient has elected to proceed with Urolift. Risks/benefits/alternatives discussed.   No follow-ups on file.  Angel Clara, MD

## 2023-11-25 NOTE — Progress Notes (Signed)
 Catheter Removal  Patient is present today for a catheter removal per MD order  10ml of water  was drained from the balloon. A 16FR foley cath was removed from the bladder, no complications were noted. Patient tolerated well.  Performed by: Veleria, CMA  Follow up/ Additional notes:  MD to see after for Cystoscopy  Simple Catheter Placement  Due to urinary retention patient is present today for a foley cath placement.  Patient was cleaned and prepped in a sterile fashion with Betadinex3 . A 16 FR foley catheter was inserted, urine return was noted  , urine was clear in color.  The balloon was filled with 10cc of sterile water .  A leg bag was attached for drainage. Patient was also given a night bag to take home and was given instruction on how to change from one bag to another.  Patient was given instruction on proper catheter care.  Patient tolerated well, no complications were noted   Performed by: Abram, CMA  Additional notes/ Follow up:  for surgery

## 2023-11-25 NOTE — H&P (View-Only) (Signed)
   11/25/23  CC: urinary retention   HPI: Mr Arbie is a 83yo here for cystoscopy for urinary retention Blood pressure (!) 148/81, pulse 91. NED. A&Ox3.   No respiratory distress   Abd soft, NT, ND Normal phallus with bilateral descended testicles  Cystoscopy Procedure Note  Patient identification was confirmed, informed consent was obtained, and patient was prepped using Betadine solution.  Lidocaine  jelly was administered per urethral meatus.     Pre-Procedure: - Inspection reveals a normal caliber ureteral meatus.  Procedure: The flexible cystoscope was introduced without difficulty - No urethral strictures/lesions are present. - Enlarged prostate no median - Normal bladder neck - Bilateral ureteral orifices identified - Bladder mucosa  reveals no ulcers, tumors, or lesions - No bladder stones - No trabeculation     Post-Procedure: - Patient tolerated the procedure well  Assessment/ Plan: We discussed the management of his BPH including continued medical therapy, Rezum, Urolift, TURP and simple prostatectomy. After discussing the options the patient has elected to proceed with Urolift. Risks/benefits/alternatives discussed.   No follow-ups on file.  [name], MD

## 2023-11-27 ENCOUNTER — Telehealth: Payer: Self-pay | Admitting: Urology

## 2023-11-27 NOTE — Telephone Encounter (Signed)
 Wants surgery after July 4th, he has too much going on

## 2023-12-03 ENCOUNTER — Other Ambulatory Visit: Payer: Self-pay

## 2023-12-04 ENCOUNTER — Telehealth: Payer: Self-pay

## 2023-12-04 ENCOUNTER — Other Ambulatory Visit: Payer: Self-pay | Admitting: Urology

## 2023-12-04 ENCOUNTER — Ambulatory Visit

## 2023-12-04 DIAGNOSIS — N138 Other obstructive and reflux uropathy: Secondary | ICD-10-CM

## 2023-12-04 DIAGNOSIS — Z466 Encounter for fitting and adjustment of urinary device: Secondary | ICD-10-CM

## 2023-12-04 NOTE — Telephone Encounter (Signed)
 Patient came into office with cath leaking. Urine will not go into cath bag through tube.  Please advise.

## 2023-12-04 NOTE — Progress Notes (Signed)
 Patient came today to have his catheter adjusted. 10mL was drained from catheter balloon, catheter was then adjusted into correct position. Catheter balloon was refilled with 15mL of sterile water . Patient tolerated well.

## 2023-12-04 NOTE — Telephone Encounter (Signed)
 Patient added to NV schedule for catheter check

## 2023-12-16 ENCOUNTER — Ambulatory Visit

## 2023-12-16 ENCOUNTER — Telehealth: Payer: Self-pay | Admitting: Urology

## 2023-12-16 DIAGNOSIS — Z978 Presence of other specified devices: Secondary | ICD-10-CM

## 2023-12-16 NOTE — Telephone Encounter (Signed)
 Needs leg anchor he does not know how to do it

## 2023-12-16 NOTE — Progress Notes (Signed)
 Cath came in to have cath anchor replaced

## 2023-12-16 NOTE — Telephone Encounter (Signed)
 Please have patient come by at 230pm today for NV and we will place anchor.  I will add to schedule.

## 2023-12-18 NOTE — Pre-Procedure Instructions (Signed)
 Attempted pre-op phone call. Line was busy.

## 2023-12-19 ENCOUNTER — Encounter (HOSPITAL_COMMUNITY)
Admission: RE | Admit: 2023-12-19 | Discharge: 2023-12-19 | Disposition: A | Discharge status: Home / Self Care | Source: Ambulatory Visit | Attending: Urology | Admitting: Urology

## 2023-12-20 ENCOUNTER — Encounter (HOSPITAL_COMMUNITY): Payer: Self-pay

## 2023-12-20 ENCOUNTER — Other Ambulatory Visit: Payer: Self-pay

## 2023-12-23 ENCOUNTER — Ambulatory Visit (HOSPITAL_COMMUNITY): Admitting: Anesthesiology

## 2023-12-23 ENCOUNTER — Ambulatory Visit (HOSPITAL_COMMUNITY)
Admission: RE | Admit: 2023-12-23 | Discharge: 2023-12-23 | Disposition: A | Discharge status: Home / Self Care | Attending: Urology | Admitting: Urology

## 2023-12-23 ENCOUNTER — Encounter (HOSPITAL_COMMUNITY)
Admission: RE | Disposition: A | Discharge status: Home / Self Care | Payer: Self-pay | Source: Home / Self Care | Attending: Urology

## 2023-12-23 ENCOUNTER — Encounter (HOSPITAL_COMMUNITY): Payer: Self-pay | Admitting: Urology

## 2023-12-23 ENCOUNTER — Other Ambulatory Visit: Payer: Self-pay

## 2023-12-23 DIAGNOSIS — N138 Other obstructive and reflux uropathy: Secondary | ICD-10-CM | POA: Diagnosis not present

## 2023-12-23 DIAGNOSIS — Z8673 Personal history of transient ischemic attack (TIA), and cerebral infarction without residual deficits: Secondary | ICD-10-CM | POA: Diagnosis not present

## 2023-12-23 DIAGNOSIS — I25119 Atherosclerotic heart disease of native coronary artery with unspecified angina pectoris: Secondary | ICD-10-CM | POA: Diagnosis not present

## 2023-12-23 DIAGNOSIS — E119 Type 2 diabetes mellitus without complications: Secondary | ICD-10-CM | POA: Insufficient documentation

## 2023-12-23 DIAGNOSIS — Z87891 Personal history of nicotine dependence: Secondary | ICD-10-CM | POA: Diagnosis not present

## 2023-12-23 DIAGNOSIS — N401 Enlarged prostate with lower urinary tract symptoms: Secondary | ICD-10-CM | POA: Insufficient documentation

## 2023-12-23 HISTORY — PX: CYSTOSCOPY WITH INSERTION OF UROLIFT: SHX6678

## 2023-12-23 LAB — GLUCOSE, CAPILLARY: Glucose-Capillary: 129 mg/dL — ABNORMAL HIGH (ref 70–99)

## 2023-12-23 SURGERY — CYSTOSCOPY WITH INSERTION OF UROLIFT
Anesthesia: General | Site: Prostate

## 2023-12-23 MED ORDER — CEFAZOLIN SODIUM-DEXTROSE 2-4 GM/100ML-% IV SOLN
2.0000 g | INTRAVENOUS | Status: AC
  Administered 2023-12-23: 2 g via INTRAVENOUS
  Filled 2023-12-23: qty 100

## 2023-12-23 MED ORDER — FENTANYL CITRATE (PF) 100 MCG/2ML IJ SOLN
INTRAMUSCULAR | Status: AC
  Filled 2023-12-23: qty 2

## 2023-12-23 MED ORDER — PROPOFOL 10 MG/ML IV BOLUS
INTRAVENOUS | Status: AC
  Filled 2023-12-23: qty 20

## 2023-12-23 MED ORDER — OXYCODONE HCL 5 MG PO TABS: 5.0000 mg | ORAL_TABLET | Freq: Once | ORAL | Status: DC | PRN

## 2023-12-23 MED ORDER — WATER FOR IRRIGATION, STERILE IR SOLN
Status: DC | PRN
  Administered 2023-12-23: 3000 mL

## 2023-12-23 MED ORDER — PROPOFOL 10 MG/ML IV BOLUS
INTRAVENOUS | Status: DC | PRN
  Administered 2023-12-23: 30 mg via INTRAVENOUS
  Administered 2023-12-23: 20 mg via INTRAVENOUS
  Administered 2023-12-23: 10 mg via INTRAVENOUS
  Administered 2023-12-23: 80 mg via INTRAVENOUS

## 2023-12-23 MED ORDER — TRAMADOL HCL 50 MG PO TABS: 50.0000 mg | ORAL_TABLET | Freq: Four times a day (QID) | ORAL | 0 refills | Status: AC | PRN

## 2023-12-23 MED ORDER — CHLORHEXIDINE GLUCONATE 0.12 % MT SOLN
15.0000 mL | Freq: Once | OROMUCOSAL | Status: AC
  Administered 2023-12-23: 15 mL via OROMUCOSAL

## 2023-12-23 MED ORDER — FENTANYL CITRATE (PF) 100 MCG/2ML IJ SOLN
INTRAMUSCULAR | Status: DC | PRN
  Administered 2023-12-23: 25 ug via INTRAVENOUS

## 2023-12-23 MED ORDER — LACTATED RINGERS IV SOLN: INTRAVENOUS | Status: DC

## 2023-12-23 MED ORDER — FENTANYL CITRATE PF 50 MCG/ML IJ SOSY: 25.0000 ug | PREFILLED_SYRINGE | INTRAMUSCULAR | Status: DC | PRN

## 2023-12-23 MED ORDER — ORAL CARE MOUTH RINSE: 15.0000 mL | Freq: Once | OROMUCOSAL | Status: AC

## 2023-12-23 MED ORDER — ONDANSETRON HCL 4 MG/2ML IJ SOLN: 4.0000 mg | Freq: Once | INTRAMUSCULAR | Status: DC | PRN

## 2023-12-23 MED ORDER — OXYCODONE HCL 5 MG/5ML PO SOLN: 5.0000 mg | Freq: Once | ORAL | Status: DC | PRN

## 2023-12-23 SURGICAL SUPPLY — 18 items
BAG DRAIN URO TABLE W/ADPT NS (BAG) ×2 IMPLANT
BAG HAMPER (MISCELLANEOUS) ×2 IMPLANT
CLOTH BEACON ORANGE TIMEOUT ST (SAFETY) ×2 IMPLANT
GLOVE BIO SURGEON STRL SZ8 (GLOVE) ×2 IMPLANT
GLOVE BIOGEL PI IND STRL 7.0 (GLOVE) ×4 IMPLANT
GOWN STRL REUS W/TWL LRG LVL3 (GOWN DISPOSABLE) ×2 IMPLANT
GOWN STRL REUS W/TWL XL LVL3 (GOWN DISPOSABLE) ×2 IMPLANT
KIT TURNOVER CYSTO (KITS) ×2 IMPLANT
MANIFOLD NEPTUNE II (INSTRUMENTS) ×2 IMPLANT
PACK CYSTO (CUSTOM PROCEDURE TRAY) ×2 IMPLANT
PAD ARMBOARD POSITIONER FOAM (MISCELLANEOUS) ×2 IMPLANT
POSITIONER HEAD 8X9X4 ADT (SOFTGOODS) ×2 IMPLANT
SYSTEM UROLIFT 2 CART W/ HNDL (Male Continence) ×2 IMPLANT
SYSTEM UROLIFT 2 CARTRIDGE (Male Continence) ×6 IMPLANT
TOWEL OR 17X26 4PK STRL BLUE (TOWEL DISPOSABLE) ×2 IMPLANT
TRAY FOLEY W/BAG SLVR 16FR ST (SET/KITS/TRAYS/PACK) ×2 IMPLANT
WATER STERILE IRR 3000ML UROMA (IV SOLUTION) ×2 IMPLANT
WATER STERILE IRR 500ML POUR (IV SOLUTION) ×2 IMPLANT

## 2023-12-23 NOTE — Transfer of Care (Signed)
 Immediate Anesthesia Transfer of Care Note  Patient: Angel Dixon  Procedure(s) Performed: CYSTOSCOPY WITH INSERTION OF UROLIFT (Prostate)  Patient Location: PACU  Anesthesia Type:General  Level of Consciousness: awake and patient cooperative  Airway & Oxygen Therapy: Patient Spontanous Breathing  Post-op Assessment: Report given to RN and Post -op Vital signs reviewed and stable  Post vital signs: Reviewed and stable  Last Vitals:  Vitals Value Taken Time  BP 96/73 12/23/23 10:34  Temp 36.5 C 12/23/23 10:34  Pulse 64 12/23/23 10:36  Resp 12 12/23/23 10:36  SpO2 100 % 12/23/23 10:36  Vitals shown include unfiled device data.  Last Pain:  Vitals:   12/23/23 0843  TempSrc: Oral  PainSc: 0-No pain      Patients Stated Pain Goal: 7 (12/23/23 0843)  Complications: No notable events documented.

## 2023-12-23 NOTE — Anesthesia Preprocedure Evaluation (Signed)
 Anesthesia Evaluation  Patient identified by MRN, date of birth, ID band Patient awake    Reviewed: Allergy & Precautions, H&P , NPO status , Patient's Chart, lab work & pertinent test results, reviewed documented beta blocker date and time   Airway Mallampati: II  TM Distance: >3 FB Neck ROM: full    Dental no notable dental hx.    Pulmonary neg pulmonary ROS, former smoker   Pulmonary exam normal breath sounds clear to auscultation       Cardiovascular Exercise Tolerance: Good hypertension, + angina  + CAD  negative cardio ROS + dysrhythmias  Rhythm:regular Rate:Normal     Neuro/Psych TIAnegative neurological ROS  negative psych ROS   GI/Hepatic negative GI ROS, Neg liver ROS, PUD,GERD  ,,  Endo/Other  negative endocrine ROSdiabetes    Renal/GU Renal diseasenegative Renal ROS  negative genitourinary   Musculoskeletal   Abdominal   Peds  Hematology negative hematology ROS (+) Blood dyscrasia, anemia   Anesthesia Other Findings   Reproductive/Obstetrics negative OB ROS                              Anesthesia Physical Anesthesia Plan  ASA: 3  Anesthesia Plan: General   Post-op Pain Management:    Induction:   PONV Risk Score and Plan: Propofol  infusion  Airway Management Planned:   Additional Equipment:   Intra-op Plan:   Post-operative Plan:   Informed Consent: I have reviewed the patients History and Physical, chart, labs and discussed the procedure including the risks, benefits and alternatives for the proposed anesthesia with the patient or authorized representative who has indicated his/her understanding and acceptance.     Dental Advisory Given  Plan Discussed with: CRNA  Anesthesia Plan Comments:         Anesthesia Quick Evaluation

## 2023-12-23 NOTE — Anesthesia Procedure Notes (Signed)
 Date/Time: 12/23/2023 10:01 AM  Performed by: Barbarann Verneita RAMAN, CRNAPre-anesthesia Checklist: Patient identified, Emergency Drugs available, Suction available, Timeout performed and Patient being monitored Patient Re-evaluated:Patient Re-evaluated prior to induction Oxygen Delivery Method: Nasal Cannula

## 2023-12-23 NOTE — Op Note (Signed)
   PREOPERATIVE DIAGNOSIS:  Benign prostatic hypertrophy with bladder outlet obstruction.  POSTOPERATIVE DIAGNOSIS:  Benign prostatic hypertrophy with bladder outlet obstruction.  PROCEDURE:  Cystoscopy with implantation of UroLift devices, 4 implants.  SURGEON:  [name], M.D.  ANESTHESIA:  MAC  ANTIBIOTICS: ancef   SPECIMEN:  None.  DRAINS:  A 16-French Foley catheter.  BLOOD LOSS:  Minimal.  COMPLICATIONS:  None.  INDICATIONS: The Patient is an 83 year old male with BPH and bladder outlet obstruction.  He has failed medical therapy and has elected UroLift for definitive treatment.  FINDINGS OF PROCEDURE:  He was taken to the operating room where a general anesthetic was induced.  He was placed in lithotomy position and was fitted with PAS hose.  His perineum and genitalia were prepped with chlorhexidine , and he was draped in usual sterile fashion.  Cystoscopy was performed using the UroLift scope and 0 degree lens. Examination revealed a normal urethra.  The external sphincter was intact.  Prostatic urethra was approximately 4 cm in length with lateral lobe enlargement. There was also little bit of bladder neck elevation. Inspection of bladder revealed mild-to-moderate trabeculation with no tumors, stones, or inflammation.  No cellules or diverticula were noted. Ureteral orifices were in their normal anatomic position effluxing clear urine.  After initial cystoscopy, the visual obturator was replaced with the first UroLift device.  This was turned to the 9 o'clock position and pulled back to the veru and then slightly advanced.  Pressure was then applied to the right lateral lobe and the UroLift device was deployed.  The second UroLift device was then inserted and applied to the left lateral lobe at 3 o'clock and deployed in the mid prostatic urethra. After this, there was still some apparent obstruction closer to the bladder neck.  So a second level of  UroLift your left device was applied between the mid urethra and the proximal urethra providing further patency to the prostatic urethra. The scope was removed and a 16-French Foley catheter was inserted without difficulty. The balloon was filled with 10 mL sterile fluid, and the catheter was placed to straight drainage.  COMPLICATIONS: None   CONDITION: Stable, extubated, transferred to PACU  PLAN: The patient will be discharged home and followup in 2 days for a voiding trial.

## 2023-12-23 NOTE — Interval H&P Note (Signed)
 History and Physical Interval Note:  12/23/2023 9:35 AM  Angel Dixon  has presented today for surgery, with the diagnosis of BPH with urinary obstruction.  The various methods of treatment have been discussed with the patient and family. After consideration of risks, benefits and other options for treatment, the patient has consented to  Procedure(s): CYSTOSCOPY WITH INSERTION OF UROLIFT (N/A) as a surgical intervention.  The patient's history has been reviewed, patient examined, no change in status, stable for surgery.  I have reviewed the patient's chart and labs.  Questions were answered to the patient's satisfaction.     [name]

## 2023-12-24 NOTE — Progress Notes (Unsigned)
 Name: Angel Dixon DOB: 17-Jun-1940 MRN: [id number]  Diagnoses: Post-operative state  HPI: Angel Dixon presents post-operatively.  Relevant History includes: 1. BPH with BOO & LUTS (frequency, nocturia).   Today He presents s/p the following procedures by Dr. Sherrilee on 12/23/2023:  PREOPERATIVE DIAGNOSIS:  Benign prostatic hypertrophy with bladder outlet obstruction.   POSTOPERATIVE DIAGNOSIS:  Benign prostatic hypertrophy with bladder outlet obstruction.   PROCEDURE:  Cystoscopy with implantation of UroLift devices, 4 implants.  Postop course: Today: He reports ***  He reports the catheter is draining ***.  He {Actions; denies-reports:120008} gross hematuria.  He {Actions; denies-reports:120008} acute flank pain, abdominal pain, fevers, nausea, or vomiting.   Medications: Current Outpatient Medications  Medication Sig Dispense Refill   acetaminophen  (TYLENOL ) 500 MG tablet Take 1 tablet (500 mg total) by mouth every 6 (six) hours as needed. 30 tablet 0   amiodarone  (PACERONE ) 200 MG tablet Take 1 tablet (200 mg total) by mouth daily. Monday-Friday. Do not take on Saturday or Sunday. 90 tablet 3   aspirin  EC 81 MG tablet Take 1 tablet (81 mg total) by mouth daily with breakfast. Swallow whole. 90 tablet 2   atorvastatin  (LIPITOR) 40 MG tablet Take 1 tablet (40 mg total) by mouth daily. 90 tablet 3   cetirizine (ZYRTEC) 10 MG tablet Take 10 mg by mouth daily as needed for allergies.     finasteride  (PROSCAR ) 5 MG tablet Take 1 tablet (5 mg total) by mouth daily. 90 tablet 3   fluticasone  (FLONASE ) 50 MCG/ACT nasal spray Place 1 spray into both nostrils as needed for allergies or rhinitis.   11   glipiZIDE  (GLUCOTROL  XL) 5 MG 24 hr tablet Take 10 mg by mouth daily.     isosorbide  mononitrate (IMDUR ) 30 MG 24 hr tablet Take 15 mg by mouth daily.     meclizine (ANTIVERT) 25 MG tablet Take 25 mg by mouth 3 (three) times daily as needed for dizziness.     metoprolol   tartrate (LOPRESSOR ) 25 MG tablet Take 1 tablet (25 mg total) by mouth 2 (two) times daily. (Patient taking differently: Take 25 mg by mouth daily.) 180 tablet 3   nitroGLYCERIN  (NITROSTAT ) 0.4 MG SL tablet Place 1 tablet (0.4 mg total) under the tongue every 5 (five) minutes as needed for chest pain (severe chest pain or pressure only). 25 tablet 3   NOVOLIN N 100 UNIT/ML injection Inject 10 Units into the skin daily before breakfast.     ONETOUCH ULTRA test strip USE 1 STRIP TO CHECK BLOOD GLUCOSE LEVEL TWICE DAILY.     pantoprazole  (PROTONIX ) 40 MG tablet Take 1 tablet (40 mg total) by mouth 2 (two) times daily. (Patient taking differently: Take 40 mg by mouth daily. May take an additional dose if needed) 60 tablet 2   Polyethylene Glycol 400 (BLINK TEARS OP) Place 1 drop into both eyes 2 (two) times daily.     silodosin  (RAPAFLO ) 8 MG CAPS capsule Take 1 capsule (8 mg total) by mouth at bedtime. 90 capsule 3   traMADol  (ULTRAM ) 50 MG tablet Take 1 tablet (50 mg total) by mouth every 6 (six) hours as needed. 15 tablet 0   trospium  (SANCTURA ) 20 MG tablet Take 20 mg by mouth 2 (two) times daily.     No current facility-administered medications for this visit.    Allergies: Allergies  Allergen Reactions   Lisinopril  Other (See Comments)    Sore throat, no obvious angioedema or cough   Sulfa Antibiotics  Hives   Vioxx [Rofecoxib] Hives   Yellow Jacket Venom [Bee Venom] Swelling    Past Medical History:  Diagnosis Date   Angina    BPH (benign prostatic hyperplasia)    CAD (coronary artery disease)    moderate, by cath   Diabetes mellitus    DJD (degenerative joint disease)    lumbar lam 4/05   Dyslipidemia    Dysrhythmia    palpitation, PVCs   GERD (gastroesophageal reflux disease)    Hypertension    Palpitations    PUD (peptic ulcer disease)    endo 03/15/10   Past Surgical History:  Procedure Laterality Date   BACK SURGERY  2005   BIOPSY  07/18/2023   Procedure: BIOPSY;   Surgeon: Cinderella Deatrice FALCON, MD;  Location: [facility];  Service: Endoscopy;;   CARDIAC CATHETERIZATION  02/22/2006   mild obs. CAD 60-70% narrowing in small inferior branch of 1st diagonal of LAD, smooth 10% narrowing of mid RCA (Dr. 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 FABIENE Pinion)   CARDIAC CATHETERIZATION  05/11/2011   nonobstructive CAD (Dr. CHARM Clay)   COLONOSCOPY N/A 06/13/2016   Procedure: COLONOSCOPY;  Surgeon: Claudis RAYMOND Rivet, MD;  Location: [facility];  Service: Endoscopy;  Laterality: N/A;  1200   COLONOSCOPY WITH PROPOFOL  N/A 07/18/2023   Procedure: COLONOSCOPY WITH PROPOFOL ;  Surgeon: Cinderella Deatrice FALCON, MD;  Location: [facility];  Service: Endoscopy;  Laterality: N/A;  10:45AM; ASA 3   ESOPHAGOGASTRODUODENOSCOPY (EGD) WITH PROPOFOL  N/A 07/18/2023   Procedure: ESOPHAGOGASTRODUODENOSCOPY (EGD) WITH PROPOFOL ;  Surgeon: Cinderella Deatrice FALCON, MD;  Location: [facility];  Service: Endoscopy;  Laterality: N/A;  10:45AM;ASA 3   HERNIA REPAIR     INGUINAL HERNIA REPAIR  1982   right   LEFT HEART CATHETERIZATION WITH CORONARY ANGIOGRAM N/A 05/14/2011   Procedure: LEFT HEART CATHETERIZATION WITH CORONARY ANGIOGRAM;  Surgeon: Alm LELON Clay, MD;  [facility];  Service: Cardiovascular;  Laterality: N/A;  Coronary angiogram and possible PCI   LUMBAR LAMINECTOMY  09/2003   L2-3, 3-4, 4-5   NM MYOCAR PERF WALL MOTION  10/03/2009   bruce myoview  - normal perfusion in all regions, EF 70%, no ischemia, low risk   POLYPECTOMY  07/18/2023   Procedure: POLYPECTOMY;  Surgeon: Cinderella Deatrice FALCON, MD;  Location: [facility];  Service: Endoscopy;;   SAVORY DILATION  07/18/2023   Procedure: SAVORY DILATION;  Surgeon: Cinderella Deatrice FALCON, MD;  Location: [facility];  Service: Endoscopy;;   TRANSTHORACIC ECHOCARDIOGRAM  03/28/2012   EF 65-70%, grade 1 diastolic dysfunction, mod AV calcification, mild MR    XI ROBOTIC ASSISTED INGUINAL HERNIA REPAIR WITH MESH Left 12/20/2022   Procedure: XI ROBOTIC ASSISTED INGUINAL HERNIA REPAIR WITH MESH;  Surgeon: Mavis Anes, MD;  Location: [facility];  Service: General;  Laterality: Left;   Family History  Problem Relation Age of Onset   Kidney failure Mother        died 34   Coronary artery disease Father        died 2   Heart attack Sister        died of MI in her 31s   Sarcoidosis Daughter        died 15   Social History   Socioeconomic History   Marital status: Married    Spouse name: Not on file   Number of children: 4  Years of education: 3   Highest education level: Not on file  Occupational History   Occupation: retired  Tobacco Use   Smoking status: Former    Current packs/day: 1.00    Average packs/day: 1 pack/day for 25.0 years (25.0 ttl pk-yrs)    Types: Cigarettes    Passive exposure: Past   Smokeless tobacco: Never   Tobacco comments:    quit smoking cigarettes ~ 1990  Vaping Use   Vaping status: Never Used  Substance and Sexual Activity   Alcohol  use: Not Currently    Comment: very seldom   Drug use: No   Sexual activity: Yes  Other Topics Concern   Not on file  Social History Narrative   Not on file   Social Drivers of Health   Financial Resource Strain: Not on file  Food Insecurity: No Food Insecurity (01/27/2023)   Hunger Vital Sign    Worried About Running Out of Food in the Last Year: Never true    Ran Out of Food in the Last Year: Never true  Transportation Needs: No Transportation Needs (01/27/2023)   PRAPARE - Administrator, Civil Service (Medical): No    Lack of Transportation (Non-Medical): No  Physical Activity: Not on file  Stress: Not on file  Social Connections: Not on file  Intimate Partner Violence: Not At Risk (01/27/2023)   Humiliation, Afraid, Rape, and Kick questionnaire    Fear of Current or Ex-Partner: No    Emotionally Abused: No    Physically Abused: No    Sexually  Abused: No    SUBJECTIVE  Review of Systems Constitutional: Patient denies any unintentional weight loss or change in strength lntegumentary: Patient denies any rashes or pruritus Cardiovascular: Patient denies chest pain or syncope Respiratory: Patient denies shortness of breath Gastrointestinal: ***Patient denies nausea, vomiting, constipation, or diarrhea ***As per HPI Musculoskeletal: Patient denies muscle cramps or weakness Neurologic: Patient denies convulsions or seizures Allergic/Immunologic: Patient denies recent allergic reaction(s) Hematologic/Lymphatic: Patient denies bleeding tendencies Endocrine: Patient denies heat/cold intolerance  GU: As per HPI.  OBJECTIVE There were no vitals filed for this visit. There is no height or weight on file to calculate BMI.  Physical Examination Constitutional: No obvious distress; patient is non-toxic appearing  Cardiovascular: No visible lower extremity edema.  Respiratory: The patient does not have audible wheezing/stridor; respirations do not appear labored  Gastrointestinal: Abdomen non-distended Musculoskeletal: Normal ROM of UEs  Skin: No obvious rashes/open sores  Neurologic: CN 2-12 grossly intact Psychiatric: Answered questions appropriately with normal affect  Hematologic/Lymphatic/Immunologic: No obvious bruises or sites of spontaneous bleeding  ***GU: Catheter draining ***clear yellow urine.  UA: ***negative ***positive for *** leukocytes, *** blood, ***nitrites Urine microscopy: *** WBC/hpf, *** RBC/hpf, *** bacteria ***glucosuria (secondary to ***Jardiance ***Farxiga  use) ***otherwise unremarkable  PVR: *** ml  ASSESSMENT No diagnosis found. *** We reviewed the operative procedures and findings.  Pre-operative symptoms are *** since the procedure.   ***voiding trial?  We agreed to plan for follow up in *** months or sooner if needed. Patient verbalized understanding of and agreement with current plan. All  questions were answered.  PLAN Advised the following: Foley catheter ***discontinued. ***No follow-ups on file.  ***No global period (charge regular E&M) - Cryotherapy - Cystoscopy w/ bladder Botox - Cystoscopy w/ bladder biopsy - Cystoscopy w/ retrograde pyelogram - Cystoscopy w/ hydrodistention - Cystoscopy w/ ureteral stent placement - Cystolitholapaxy - Ureteroscopy w/ ureteral stent placement - Ureteroscopy w/ stone removal - Urolift - TURBT -  Prostate biopsy  ***10 day global period: - Circumcision  ***Everything else pretty much is 90 day global period (including TURP)  No orders of the defined types were placed in this encounter.   It has been explained that the patient is to follow regularly with their PCP in addition to all other providers involved in their care and to follow instructions provided by these respective offices. Patient advised to contact urology clinic if any urologic-pertaining questions, concerns, new symptoms or problems arise in the interim period.  There are no Patient Instructions on file for this visit.  Electronically signed by:  Lauraine KYM Oz, MSN, FNP-C, CUNP 12/24/2023 1:21 PM

## 2023-12-25 ENCOUNTER — Encounter: Payer: Self-pay | Admitting: Urology

## 2023-12-25 ENCOUNTER — Ambulatory Visit: Admitting: Urology

## 2023-12-25 VITALS — BP 150/91 | HR 76

## 2023-12-25 DIAGNOSIS — Z09 Encounter for follow-up examination after completed treatment for conditions other than malignant neoplasm: Secondary | ICD-10-CM

## 2023-12-25 DIAGNOSIS — R338 Other retention of urine: Secondary | ICD-10-CM

## 2023-12-25 DIAGNOSIS — N138 Other obstructive and reflux uropathy: Secondary | ICD-10-CM | POA: Diagnosis not present

## 2023-12-25 DIAGNOSIS — N401 Enlarged prostate with lower urinary tract symptoms: Secondary | ICD-10-CM

## 2023-12-25 DIAGNOSIS — R339 Retention of urine, unspecified: Secondary | ICD-10-CM

## 2023-12-25 DIAGNOSIS — I252 Old myocardial infarction: Secondary | ICD-10-CM | POA: Insufficient documentation

## 2023-12-25 MED ORDER — CIPROFLOXACIN HCL 500 MG PO TABS
500.0000 mg | ORAL_TABLET | Freq: Once | ORAL | Status: AC
  Administered 2023-12-25: 500 mg via ORAL

## 2023-12-25 NOTE — Patient Instructions (Signed)
 PLAN 1. Foley catheter discontinued. 2. Continue Proscar  (Finasteride ) 5 mg daily. 3. Discontinue Rapaflo  (Silodosin ) 8 mg daily.  4. May continue Sanctura  (Trospium ); hold this medication if you are having any difficulty voiding. 5. Avoid / manage constipation.

## 2023-12-25 NOTE — Progress Notes (Signed)
 Fill and Pull Catheter Removal  Patient is present today for a catheter removal.  150 ml of sterile water  was instilled into the bladder when the patient felt the urge to urinate. 10 ml of water  was then drained from the balloon.  A 16 FR foley cath was removed from the bladder no complications were noted .  Foley catheter intact and time of removal. Patient as then given some time to void on their own.  Patient can void  75 ml on their own after some time.  Patient tolerated well.  One oral prophylactic antibiotic given per MD orders  Performed by: Angel Dixon, CMA  Follow up/ Additional notes: Keep f/u appt

## 2023-12-28 NOTE — Anesthesia Postprocedure Evaluation (Signed)
 Anesthesia Post Note  Patient: DEMARYIUS IMRAN  Procedure(s) Performed: CYSTOSCOPY WITH INSERTION OF UROLIFT (Prostate)  Patient location during evaluation: Phase II Anesthesia Type: General Level of consciousness: awake Pain management: pain level controlled Vital Signs Assessment: post-procedure vital signs reviewed and stable Respiratory status: spontaneous breathing and respiratory function stable Cardiovascular status: blood pressure returned to baseline and stable Postop Assessment: no headache and no apparent nausea or vomiting Anesthetic complications: no Comments: Late entry   No notable events documented.   Last Vitals:  Vitals:   12/23/23 1045 12/23/23 1057  BP: (!) 175/106 (!) 177/100  Pulse: 68 74  Resp: 12   Temp:  36.6 C  SpO2: 93% 100%    Last Pain:  Vitals:   12/24/23 1436  TempSrc:   PainSc: 0-No pain                 Yvonna JINNY Bosworth

## 2024-03-13 ENCOUNTER — Ambulatory Visit: Payer: Medicare HMO | Admitting: Urology

## 2024-03-13 ENCOUNTER — Encounter: Payer: Self-pay | Admitting: Urology

## 2024-03-13 VITALS — BP 156/96 | HR 98

## 2024-03-13 DIAGNOSIS — N138 Other obstructive and reflux uropathy: Secondary | ICD-10-CM | POA: Diagnosis not present

## 2024-03-13 DIAGNOSIS — R351 Nocturia: Secondary | ICD-10-CM

## 2024-03-13 DIAGNOSIS — R339 Retention of urine, unspecified: Secondary | ICD-10-CM

## 2024-03-13 DIAGNOSIS — N401 Enlarged prostate with lower urinary tract symptoms: Secondary | ICD-10-CM | POA: Diagnosis not present

## 2024-03-13 DIAGNOSIS — R338 Other retention of urine: Secondary | ICD-10-CM | POA: Diagnosis not present

## 2024-03-13 DIAGNOSIS — N3000 Acute cystitis without hematuria: Secondary | ICD-10-CM

## 2024-03-13 LAB — MICROSCOPIC EXAMINATION: WBC, UA: >30 /HPF — AB (ref 0–5)

## 2024-03-13 LAB — URINALYSIS, ROUTINE W REFLEX MICROSCOPIC
Bilirubin, UA: NEGATIVE
Glucose, UA: NEGATIVE
Ketones, UA: NEGATIVE
Nitrite, UA: NEGATIVE
Protein,UA: NEGATIVE
Specific Gravity, UA: <1.005 — ABNORMAL LOW (ref 1.005–1.030)
Urobilinogen, Ur: 0.2 mg/dL (ref 0.2–1.0)
pH, UA: 6 (ref 5.0–7.5)

## 2024-03-13 MED ORDER — TROSPIUM CHLORIDE 20 MG PO TABS: 20.0000 mg | ORAL_TABLET | Freq: Two times a day (BID) | ORAL | 11 refills | Status: AC

## 2024-03-13 MED ORDER — SILODOSIN 8 MG PO CAPS: 8.0000 mg | ORAL_CAPSULE | Freq: Every day | ORAL | 3 refills | Status: AC

## 2024-03-13 MED ORDER — FINASTERIDE 5 MG PO TABS: 5.0000 mg | ORAL_TABLET | Freq: Every day | ORAL | 3 refills | Status: AC

## 2024-03-13 NOTE — Patient Instructions (Signed)

## 2024-03-13 NOTE — Progress Notes (Signed)
 03/13/2024 11:12 AM   Angel Dixon 05/24/1941 [id number]  Referring provider: No referring provider defined for this encounter.  No chief complaint on file.   HPI: [name] is a 83yo here for followup for BPH with nocturia and urinary retention. IPSS 11 QOL 2 on rapaflo  8mg  and finasteride  5mg  daily. Urine stream strong. No straining to urinate. Nocturia 0-1x. No dysuria or hematuria. No UTIs since last visit. He had an issues with urinary retention after taking flonase .   PMH: Past Medical History:  Diagnosis Date   Angina    BPH (benign prostatic hyperplasia)    CAD (coronary artery disease)    moderate, by cath   Diabetes mellitus    DJD (degenerative joint disease)    lumbar lam 4/05   Dyslipidemia    Dysrhythmia    palpitation, PVCs   GERD (gastroesophageal reflux disease)    Hypertension    Palpitations    PUD (peptic ulcer disease)    endo 03/15/10    Surgical History: Past Surgical History:  Procedure Laterality Date   BACK SURGERY  2005   BIOPSY  07/18/2023   Procedure: BIOPSY;  Surgeon: Cinderella Deatrice FALCON, MD;  [facility]: [facility];  Service: Endoscopy;;   CARDIAC CATHETERIZATION  02/22/2006   mild obs. CAD 60-70% narrowing in small inferior branch of 1st diagonal of LAD, smooth 10% narrowing of mid RCA (Dr. 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 FABIENE Pinion)   CARDIAC CATHETERIZATION  05/11/2011   nonobstructive CAD (Dr. CHARM Clay)   COLONOSCOPY N/A 06/13/2016   Procedure: COLONOSCOPY;  Surgeon: Claudis RAYMOND Rivet, MD;  Location: [facility];  Service: Endoscopy;  Laterality: N/A;  1200   COLONOSCOPY WITH PROPOFOL  N/A 07/18/2023   Procedure: COLONOSCOPY WITH PROPOFOL ;  Surgeon: Cinderella Deatrice FALCON, MD;  [facility]: [facility];  Service: Endoscopy;  Laterality: N/A;  10:45AM; ASA 3   CYSTOSCOPY WITH INSERTION OF UROLIFT N/A 12/23/2023   Procedure: CYSTOSCOPY  WITH INSERTION OF UROLIFT;  Surgeon: Sherrilee Belvie CROME, MD;  Location: [facility];  Service: Urology;  Laterality: N/A;   ESOPHAGOGASTRODUODENOSCOPY (EGD) WITH PROPOFOL  N/A 07/18/2023   Procedure: ESOPHAGOGASTRODUODENOSCOPY (EGD) WITH PROPOFOL ;  Surgeon: Cinderella Deatrice FALCON, MD;  [facility]: [facility];  Service: Endoscopy;  Laterality: N/A;  10:45AM;ASA 3   HERNIA REPAIR     INGUINAL HERNIA REPAIR  1982   right   LEFT HEART CATHETERIZATION WITH CORONARY ANGIOGRAM N/A 05/14/2011   Procedure: LEFT HEART CATHETERIZATION WITH CORONARY ANGIOGRAM;  Surgeon: Alm LELON Clay, MD;  Location: [facility] CATH LAB;  Service: Cardiovascular;  Laterality: N/A;  Coronary angiogram and possible PCI   LUMBAR LAMINECTOMY  09/2003   L2-3, 3-4, 4-5   NM MYOCAR PERF WALL MOTION  10/03/2009   bruce myoview  - normal perfusion in all regions, EF 70%, no ischemia, low risk   POLYPECTOMY  07/18/2023   Procedure: POLYPECTOMY;  Surgeon: Cinderella Deatrice FALCON, MD;  [facility];  Service: Endoscopy;;   SAVORY DILATION  07/18/2023   Procedure: SAVORY DILATION;  Surgeon: Cinderella Deatrice FALCON, MD;  [facility]: [facility];  Service: Endoscopy;;   TRANSTHORACIC ECHOCARDIOGRAM  03/28/2012   EF 65-70%, grade 1 diastolic dysfunction, mod AV calcification, mild MR   XI ROBOTIC ASSISTED INGUINAL HERNIA REPAIR WITH MESH Left 12/20/2022   Procedure: XI ROBOTIC ASSISTED INGUINAL HERNIA REPAIR WITH MESH;  Surgeon: Mavis Anes, MD;  Location: [facility];  Service: General;  Laterality: Left;    Home Medications:  Allergies as of 03/13/2024       Reactions   Lisinopril  Other (See Comments)   Sore throat, no obvious angioedema or cough   Sulfa Antibiotics Hives   Vioxx [rofecoxib] Hives   Yellow Jacket Venom [bee Venom] Swelling        Medication List        Accurate as of March 13, 2024 11:12 AM. If you have any questions, ask your nurse or doctor.          acetaminophen  500 MG tablet Commonly known as: TYLENOL  Take 1 tablet  (500 mg total) by mouth every 6 (six) hours as needed.   amiodarone  200 MG tablet Commonly known as: PACERONE  Take 1 tablet (200 mg total) by mouth daily. Monday-Friday. Do not take on Saturday or Sunday.   aspirin  EC 81 MG tablet Take 1 tablet (81 mg total) by mouth daily with breakfast. Swallow whole.   atorvastatin  40 MG tablet Commonly known as: LIPITOR Take 1 tablet (40 mg total) by mouth daily.   BLINK TEARS OP Place 1 drop into both eyes 2 (two) times daily.   cetirizine 10 MG tablet Commonly known as: ZYRTEC Take 10 mg by mouth daily as needed for allergies.   finasteride  5 MG tablet Commonly known as: PROSCAR  Take 1 tablet (5 mg total) by mouth daily.   fluticasone  50 MCG/ACT nasal spray Commonly known as: FLONASE  Place 1 spray into both nostrils as needed for allergies or rhinitis.   glipiZIDE  5 MG 24 hr tablet Commonly known as: GLUCOTROL  XL Take 10 mg by mouth daily.   isosorbide  mononitrate 30 MG 24 hr tablet Commonly known as: IMDUR  Take 15 mg by mouth daily.   meclizine 25 MG tablet Commonly known as: ANTIVERT Take 25 mg by mouth 3 (three) times daily as needed for dizziness.   metoprolol  tartrate 25 MG tablet Commonly known as: LOPRESSOR  Take 1 tablet (25 mg total) by mouth 2 (two) times daily. What changed: when to take this   nitroGLYCERIN  0.4 MG SL tablet Commonly known as: NITROSTAT  Place 1 tablet (0.4 mg total) under the tongue every 5 (five) minutes as needed for chest pain (severe chest pain or pressure only).   NovoLIN N 100 UNIT/ML injection Generic drug: insulin  NPH Human Inject 10 Units into the skin daily before breakfast.   OneTouch Ultra test strip Generic drug: glucose blood USE 1 STRIP TO CHECK BLOOD GLUCOSE LEVEL TWICE DAILY.   pantoprazole  40 MG tablet Commonly known as: PROTONIX  Take 1 tablet (40 mg total) by mouth 2 (two) times daily. What changed:  when to take this additional instructions   silodosin  8 MG Caps  capsule Commonly known as: RAPAFLO  Take 1 capsule (8 mg total) by mouth at bedtime.   traMADol  50 MG tablet Commonly known as: Ultram  Take 1 tablet (50 mg total) by mouth every 6 (six) hours as needed.   trospium  20 MG tablet Commonly known as: SANCTURA  Take 20 mg by mouth 2 (two) times daily.        Allergies:  Allergies  Allergen Reactions   Lisinopril  Other (See Comments)    Sore throat, no obvious angioedema or cough   Sulfa Antibiotics Hives   Vioxx [Rofecoxib] Hives   Yellow Jacket Venom [Bee Venom] Swelling    Family History: Family History  Problem Relation Age of Onset   Kidney failure Mother  died 19   Coronary artery disease Father        died 80   Heart attack Sister        died of MI in her 91s   Sarcoidosis Daughter        died 3    Social History:  reports that he has quit smoking. His smoking use included cigarettes. He has a 25 pack-year smoking history. He has been exposed to tobacco smoke. He has never used smokeless tobacco. He reports that he does not currently use alcohol . He reports that he does not use drugs.  ROS: All other review of systems were reviewed and are negative except what is noted above in HPI  Physical Exam: BP (!) 156/96   Pulse 98   Constitutional:  Alert and oriented, No acute distress. HEENT: [place] AT, moist mucus membranes.  Trachea midline, no masses. Cardiovascular: No clubbing, cyanosis, or edema. Respiratory: Normal respiratory effort, no increased work of breathing. GI: Abdomen is soft, nontender, nondistended, no abdominal masses GU: No CVA tenderness.  Lymph: No cervical or inguinal lymphadenopathy. Skin: No rashes, bruises or suspicious lesions. Neurologic: Grossly intact, no focal deficits, moving all 4 extremities. Psychiatric: Normal mood and affect.  Laboratory Data: Lab Results  Component Value Date   WBC 7.1 11/06/2023   HGB 12.1 (L) 11/06/2023   HCT 36.5 (L) 11/06/2023   MCV 85.3 11/06/2023    PLT 300 11/06/2023    Lab Results  Component Value Date   CREATININE 1.14 11/06/2023    No results found for: PSA  No results found for: TESTOSTERONE  Lab Results  Component Value Date   HGBA1C 8.9 (H) 01/27/2023    Urinalysis    Component Value Date/Time   COLORURINE STRAW (A) 11/06/2023 1158   APPEARANCEUR CLEAR 11/06/2023 1158   APPEARANCEUR Clear 03/13/2023 1023   LABSPEC 1.005 11/06/2023 1158   PHURINE 6.0 11/06/2023 1158   GLUCOSEU NEGATIVE 11/06/2023 1158   HGBUR NEGATIVE 11/06/2023 1158   BILIRUBINUR NEGATIVE 11/06/2023 1158   BILIRUBINUR Negative 03/13/2023 1023   KETONESUR NEGATIVE 11/06/2023 1158   PROTEINUR NEGATIVE 11/06/2023 1158   UROBILINOGEN 0.2 12/20/2014 1314   NITRITE NEGATIVE 11/06/2023 1158   LEUKOCYTESUR NEGATIVE 11/06/2023 1158    Lab Results  Component Value Date   LABMICR Comment 03/13/2023   BACTERIA RARE (A) 11/06/2023    Pertinent Imaging:  Results for orders placed during the hospital encounter of 09/04/03  DG Abd 1 View  Narrative Clinical Data:    Postop from lumbar spine surgery.  Abdominal distention and pain. ABDOMEN, ONE VIEW Diffuse gaseous distention of colon is seen as well as small bowel loops and with gas noted to the level of the rectosigmoid region.  This is consistent with a postoperative ileus.  Midline abdominal skin staples are seen. IMPRESSION Ileus pattern.  Provider: Dick Pines  No results found for this or any previous visit.  No results found for this or any previous visit.  No results found for this or any previous visit.  Results for orders placed during the hospital encounter of 06/19/16  US  Renal  Narrative CLINICAL DATA:  BILATERAL flank pain, history hypertension, diabetes mellitus, GERD, coronary artery disease  EXAM: RENAL / URINARY TRACT ULTRASOUND COMPLETE  COMPARISON:  CT abdomen and pelvis 02/20/2007  FINDINGS: Right Kidney:  Length: 11.2 cm. Normal cortical thickness  with upper normal cortical echogenicity. Large bilobed central RIGHT renal cyst 6.5 x 3.8 x 3.0 cm, increased in size since previous exam.  Slightly irregular septations at the central portion complicate the lesion. No additional mass, hydronephrosis or shadowing calcification.  Left Kidney:  Length: 11.9 cm. Normal cortical thickness. Upper normal cortical echogenicity. No mass, hydronephrosis, or shadowing calcification  Bladder:  Normal appearance. Enlarged prostate gland 6.8 x 5.2 x 5.9 cm noted.  IMPRESSION: Interval increase in size of a bilobed cyst at the mid RIGHT kidney, demonstrating slightly irregular and thick septation at the central portion ; further characterization of this lesion by MR imaging with and without contrast recommended to exclude cystic renal neoplasm.  Significant prostatic enlargement.   Electronically Signed By: Oneil Kiss M.D. On: 06/19/2016 15:11  No results found for this or any previous visit.  No results found for this or any previous visit.  No results found for this or any previous visit.   Assessment & Plan:    1. Benign prostatic hyperplasia with urinary obstruction (Primary) Continue rapalfo 8mg  - Urinalysis, Routine w reflex microscopic  2. Urinary retention Continue rapaflo  8mg  and finasteride   3. Nocturia Continue sanctura  20mg  BID   No follow-ups on file.  [name], MD  [facility] [place]

## 2024-03-16 LAB — URINE CULTURE

## 2024-03-27 ENCOUNTER — Ambulatory Visit: Admitting: Urology

## 2024-03-30 ENCOUNTER — Telehealth: Payer: Self-pay | Admitting: Urology

## 2024-03-30 NOTE — Telephone Encounter (Signed)
 Called pt to make him aware Dr. Sherrilee prescribe both Trospium  Chloride 20 MG BID and Finasteride  5 mg daily. Pt voiced understanding

## 2024-03-30 NOTE — Telephone Encounter (Signed)
 Patient called into the office today with general questions/concerns regarding medications. He takes Finasteride  5mg  and Trospium  Chloride 20mg  and wants to know if he needs both. Patient may be reached at [phone] to discuss questions.

## 2024-04-02 ENCOUNTER — Encounter (INDEPENDENT_AMBULATORY_CARE_PROVIDER_SITE_OTHER): Payer: Self-pay | Admitting: Gastroenterology

## 2024-04-02 ENCOUNTER — Other Ambulatory Visit (HOSPITAL_COMMUNITY): Payer: Self-pay

## 2024-04-02 DIAGNOSIS — R0789 Other chest pain: Secondary | ICD-10-CM

## 2024-04-09 ENCOUNTER — Encounter (INDEPENDENT_AMBULATORY_CARE_PROVIDER_SITE_OTHER): Payer: Self-pay | Admitting: Gastroenterology

## 2024-05-09 ENCOUNTER — Emergency Department (HOSPITAL_COMMUNITY)

## 2024-05-09 ENCOUNTER — Emergency Department (HOSPITAL_COMMUNITY)
Admission: EM | Admit: 2024-05-09 | Discharge: 2024-05-10 | Disposition: A | Attending: Emergency Medicine | Admitting: Emergency Medicine

## 2024-05-09 ENCOUNTER — Other Ambulatory Visit: Payer: Self-pay

## 2024-05-09 ENCOUNTER — Encounter (HOSPITAL_COMMUNITY): Payer: Self-pay | Admitting: Emergency Medicine

## 2024-05-09 DIAGNOSIS — Z79899 Other long term (current) drug therapy: Secondary | ICD-10-CM | POA: Insufficient documentation

## 2024-05-09 DIAGNOSIS — R002 Palpitations: Secondary | ICD-10-CM | POA: Insufficient documentation

## 2024-05-09 DIAGNOSIS — R0789 Other chest pain: Secondary | ICD-10-CM | POA: Insufficient documentation

## 2024-05-09 DIAGNOSIS — R42 Dizziness and giddiness: Secondary | ICD-10-CM | POA: Insufficient documentation

## 2024-05-09 DIAGNOSIS — E119 Type 2 diabetes mellitus without complications: Secondary | ICD-10-CM | POA: Insufficient documentation

## 2024-05-09 DIAGNOSIS — Z794 Long term (current) use of insulin: Secondary | ICD-10-CM | POA: Insufficient documentation

## 2024-05-09 DIAGNOSIS — I129 Hypertensive chronic kidney disease with stage 1 through stage 4 chronic kidney disease, or unspecified chronic kidney disease: Secondary | ICD-10-CM | POA: Insufficient documentation

## 2024-05-09 DIAGNOSIS — R0602 Shortness of breath: Secondary | ICD-10-CM | POA: Insufficient documentation

## 2024-05-09 DIAGNOSIS — I2510 Atherosclerotic heart disease of native coronary artery without angina pectoris: Secondary | ICD-10-CM | POA: Insufficient documentation

## 2024-05-09 DIAGNOSIS — Z7984 Long term (current) use of oral hypoglycemic drugs: Secondary | ICD-10-CM | POA: Insufficient documentation

## 2024-05-09 DIAGNOSIS — Z7982 Long term (current) use of aspirin: Secondary | ICD-10-CM | POA: Insufficient documentation

## 2024-05-09 DIAGNOSIS — N189 Chronic kidney disease, unspecified: Secondary | ICD-10-CM | POA: Insufficient documentation

## 2024-05-09 LAB — HEPATIC FUNCTION PANEL
ALT: 11 U/L (ref 0–44)
AST: 13 U/L — ABNORMAL LOW (ref 15–41)
Albumin: 3.7 g/dL (ref 3.5–5.0)
Alkaline Phosphatase: 107 U/L (ref 38–126)
Bilirubin, Direct: 0.2 mg/dL (ref 0.0–0.2)
Indirect Bilirubin: 0.4 mg/dL (ref 0.3–0.9)
Total Bilirubin: 0.6 mg/dL (ref 0.0–1.2)
Total Protein: 6.4 g/dL — ABNORMAL LOW (ref 6.5–8.1)

## 2024-05-09 LAB — CBC WITH DIFFERENTIAL/PLATELET
Abs Immature Granulocytes: 0.02 K/uL (ref 0.00–0.07)
Basophils Absolute: 0 K/uL (ref 0.0–0.1)
Basophils Relative: 1 %
Eosinophils Absolute: 0.1 K/uL (ref 0.0–0.5)
Eosinophils Relative: 3 %
HCT: 32.3 % — ABNORMAL LOW (ref 39.0–52.0)
Hemoglobin: 10.7 g/dL — ABNORMAL LOW (ref 13.0–17.0)
Immature Granulocytes: 0 %
Lymphocytes Relative: 22 %
Lymphs Abs: 1 K/uL (ref 0.7–4.0)
MCH: 28.4 pg (ref 26.0–34.0)
MCHC: 33.1 g/dL (ref 30.0–36.0)
MCV: 85.7 fL (ref 80.0–100.0)
Monocytes Absolute: 0.6 K/uL (ref 0.1–1.0)
Monocytes Relative: 12 %
Neutro Abs: 2.9 K/uL (ref 1.7–7.7)
Neutrophils Relative %: 62 %
Platelets: 283 K/uL (ref 150–400)
RBC: 3.77 MIL/uL — ABNORMAL LOW (ref 4.22–5.81)
RDW: 12.4 % (ref 11.5–15.5)
WBC: 4.6 K/uL (ref 4.0–10.5)
nRBC: 0 % (ref 0.0–0.2)

## 2024-05-09 LAB — BASIC METABOLIC PANEL WITH GFR
Anion gap: 4 — ABNORMAL LOW (ref 5–15)
BUN: 14 mg/dL (ref 8–23)
CO2: 31 mmol/L (ref 22–32)
Calcium: 8.6 mg/dL — ABNORMAL LOW (ref 8.9–10.3)
Chloride: 102 mmol/L (ref 98–111)
Creatinine, Ser: 1.04 mg/dL (ref 0.61–1.24)
GFR, Estimated: >60 mL/min (ref >60)
Glucose, Bld: 270 mg/dL — ABNORMAL HIGH (ref 70–99)
Potassium: 4 mmol/L (ref 3.5–5.1)
Sodium: 137 mmol/L (ref 135–145)

## 2024-05-09 LAB — CBG MONITORING, ED: Glucose-Capillary: 288 mg/dL — ABNORMAL HIGH (ref 70–99)

## 2024-05-09 LAB — LIPASE, BLOOD: Lipase: 39 U/L (ref 11–51)

## 2024-05-09 LAB — PRO BRAIN NATRIURETIC PEPTIDE: Pro Brain Natriuretic Peptide: 87.6 pg/mL (ref <300.0)

## 2024-05-09 LAB — TROPONIN T, HIGH SENSITIVITY: Troponin T High Sensitivity: <15 ng/L (ref 0–19)

## 2024-05-09 LAB — MAGNESIUM: Magnesium: 2.1 mg/dL (ref 1.7–2.4)

## 2024-05-09 NOTE — ED Provider Notes (Signed)
  EMERGENCY DEPARTMENT AT [facility] Provider Note   CSN: [id number] Arrival date & time: 05/09/24  1911     Patient presents with: Palpitations and Headache   Angel Dixon is a 83 y.o. male.   HPI Patient presents for palpitations.  Medical history includes DM, HTN, HLD, GERD, PUD, BPH, CAD, CKD, atrial fibrillation.  He is prescribed amiodarone .  He is not sure if he is still taking it.  He underwent Zio patch monitoring in May.  There were 2 short episodes of NSVT, occasional PACs, and frequent PVCs.  Earlier today, he was in his normal state of health.  This evening, he experienced an episode of palpitations, described as feeling like his heart is racing.  At the time, he had associated dizziness and shortness of breath.  He took a sublingual nitroglycerin  which seemed to cause a headache.  His episode lasted for approximately 30 minutes.  Currently, he endorses some mild dizziness and shortness of breath.  He denies any other current symptoms.    Prior to Admission medications   Medication Sig Start Date End Date Taking? Authorizing Provider  acetaminophen  (TYLENOL ) 500 MG tablet Take 1 tablet (500 mg total) by mouth every 6 (six) hours as needed. 01/26/23   Nivia Colon, PA-C  amiodarone  (PACERONE ) 200 MG tablet Take 1 tablet (200 mg total) by mouth daily. Monday-Friday. Do not take on Saturday or Sunday. 10/15/23   Waddell Danelle ORN, MD  aspirin  EC 81 MG tablet Take 1 tablet (81 mg total) by mouth daily with breakfast. Swallow whole. 11/06/22   Pearlean Manus, MD  atorvastatin  (LIPITOR) 40 MG tablet Take 1 tablet (40 mg total) by mouth daily. 11/06/22   Pearlean Manus, MD  cetirizine (ZYRTEC) 10 MG tablet Take 10 mg by mouth daily as needed for allergies.    Medicine, [facility]  finasteride  (PROSCAR ) 5 MG tablet Take 1 tablet (5 mg total) by mouth daily. 03/13/24   McKenzie, Belvie CROME, MD  fluticasone  (FLONASE ) 50 MCG/ACT nasal spray Place 1 spray into both  nostrils as needed for allergies or rhinitis.  09/20/14   [provider]  glipiZIDE  (GLUCOTROL  XL) 5 MG 24 hr tablet Take 10 mg by mouth daily. 12/16/22   [provider]  isosorbide  mononitrate (IMDUR ) 30 MG 24 hr tablet Take 15 mg by mouth daily. 02/10/23   [provider]  meclizine (ANTIVERT) 25 MG tablet Take 25 mg by mouth 3 (three) times daily as needed for dizziness. 11/27/22   [provider]  metoprolol  tartrate (LOPRESSOR ) 25 MG tablet Take 1 tablet (25 mg total) by mouth 2 (two) times daily. Patient taking differently: Take 25 mg by mouth daily. 11/06/22   Pearlean Manus, MD  nitroGLYCERIN  (NITROSTAT ) 0.4 MG SL tablet Place 1 tablet (0.4 mg total) under the tongue every 5 (five) minutes as needed for chest pain (severe chest pain or pressure only). 08/12/20 05/19/29  Mona Vinie BROCKS, MD  NOVOLIN N 100 UNIT/ML injection Inject 10 Units into the skin daily before breakfast. 10/17/22   [provider]  ONETOUCH ULTRA test strip USE 1 STRIP TO CHECK BLOOD GLUCOSE LEVEL TWICE DAILY. 11/22/20   [provider]  pantoprazole  (PROTONIX ) 40 MG tablet Take 1 tablet (40 mg total) by mouth 2 (two) times daily. Patient taking differently: Take 40 mg by mouth daily. May take an additional dose if needed 11/06/22   Pearlean Manus, MD  Polyethylene Glycol 400 (BLINK TEARS OP) Place 1 drop into  both eyes 2 (two) times daily.    [provider]  silodosin  (RAPAFLO ) 8 MG CAPS capsule Take 1 capsule (8 mg total) by mouth at bedtime. 03/13/24   McKenzie, Belvie CROME, MD  traMADol  (ULTRAM ) 50 MG tablet Take 1 tablet (50 mg total) by mouth every 6 (six) hours as needed. 12/23/23 12/22/24  Sherrilee Belvie CROME, MD  trospium  (SANCTURA ) 20 MG tablet Take 1 tablet (20 mg total) by mouth 2 (two) times daily. 03/13/24   Sherrilee Belvie CROME, MD    Allergies: Lisinopril , Sulfa antibiotics, Vioxx [rofecoxib], and Yellow jacket venom [bee venom]    Review of Systems   Respiratory:  Positive for chest tightness and shortness of breath.   Cardiovascular:  Positive for palpitations.  Neurological:  Positive for dizziness and headaches.  All other systems reviewed and are negative.   Updated Vital Signs BP 138/72   Pulse 70   Temp 98.2 F (36.8 C) (Oral)   Resp (!) 23   Ht 5' 8 (1.727 m)   Wt 61.7 kg   SpO2 96%   BMI 20.68 kg/m   Physical Exam Vitals and nursing note reviewed.  Constitutional:      General: He is not in acute distress.    Appearance: He is well-developed. He is not ill-appearing, toxic-appearing or diaphoretic.  HENT:     Head: Normocephalic and atraumatic.  Eyes:     Extraocular Movements: Extraocular movements intact.     Conjunctiva/sclera: Conjunctivae normal.  Cardiovascular:     Rate and Rhythm: Normal rate and regular rhythm.     Heart sounds: No murmur heard. Pulmonary:     Effort: Pulmonary effort is normal. No respiratory distress.     Breath sounds: No wheezing or rales.  Chest:     Chest wall: No tenderness.  Abdominal:     General: There is no distension.     Palpations: Abdomen is soft.     Tenderness: There is no abdominal tenderness.  Musculoskeletal:        General: No swelling. Normal range of motion.     Cervical back: Neck supple.     Right lower leg: No edema.     Left lower leg: No edema.  Skin:    General: Skin is warm and dry.     Coloration: Skin is not jaundiced or pale.  Neurological:     General: No focal deficit present.     Mental Status: He is alert and oriented to person, place, and time.     Cranial Nerves: No cranial nerve deficit.     Sensory: No sensory deficit.     Motor: No weakness.     Coordination: Coordination normal.  Psychiatric:        Mood and Affect: Mood normal.        Behavior: Behavior normal.     (all labs ordered are listed, but only abnormal results are displayed) Labs Reviewed  BASIC METABOLIC PANEL WITH GFR - Abnormal; Notable for the following  components:      Result Value   Glucose, Bld 270 (*)    Calcium  8.6 (*)    Anion gap 4 (*)    All other components within normal limits  CBC WITH DIFFERENTIAL/PLATELET - Abnormal; Notable for the following components:   RBC 3.77 (*)    Hemoglobin 10.7 (*)    HCT 32.3 (*)    All other components within normal limits  HEPATIC FUNCTION PANEL - Abnormal; Notable for the following components:  Total Protein 6.4 (*)    AST 13 (*)    All other components within normal limits  CBG MONITORING, ED - Abnormal; Notable for the following components:   Glucose-Capillary 288 (*)    All other components within normal limits  MAGNESIUM   LIPASE, BLOOD  PRO BRAIN NATRIURETIC PEPTIDE  TROPONIN T, HIGH SENSITIVITY  TROPONIN T, HIGH SENSITIVITY    EKG: EKG Interpretation Date/Time:  Saturday May 09 2024 19:22:13 EST Ventricular Rate:  88 PR Interval:  168 QRS Duration:  83 QT Interval:  369 QTC Calculation: 447 R Axis:   37  Text Interpretation: Sinus rhythm Atrial premature complex Probable left atrial enlargement Abnormal R-wave progression, early transition Probable inferior infarct, old Lateral leads are also involved Confirmed by Melvenia Motto [phone]) on 05/09/2024 10:37:58 PM  Radiology: DG Chest Portable 1 View Result Date: 05/09/2024 EXAM: 1 VIEW(S) XRAY OF THE CHEST 05/09/2024 08:02:53 PM COMPARISON: 05/13/2023. CLINICAL HISTORY: FINDINGS: LUNGS AND PLEURA: Scarring in the right lung base. No acute confluent airspace opacities. No pleural effusion. No pneumothorax. HEART AND MEDIASTINUM: No acute abnormality of the cardiac and mediastinal silhouettes. BONES AND SOFT TISSUES: No acute osseous abnormality. IMPRESSION: 1. No acute cardiopulmonary process. Electronically signed by: Franky Crease MD 05/09/2024 08:19 PM EST RP Workstation: [id number]     Procedures   Medications Ordered in the ED - No data to display                                  Medical Decision Making Amount  and/or Complexity of Data Reviewed Labs: ordered. Radiology: ordered.   This patient presents to the ED for concern of palpitations, this involves an extensive number of treatment options, and is a complaint that carries with it a high risk of complications and morbidity.  The differential diagnosis includes arrhythmia, PVCs, anxiety, dehydration, metabolic derangements   Co morbidities / Chronic conditions that complicate the patient evaluation  DM, HTN, HLD, GERD, PUD, BPH, CAD, CKD, atrial fibrillation   Additional history obtained:  Additional history obtained from EMR External records from outside source obtained and reviewed including N/A   Lab Tests:  I Ordered, and personally interpreted labs.  The pertinent results include: Normal hemoglobin, no leukocytosis, normal electrolytes, normal troponin   Imaging Studies ordered:  I ordered imaging studies including chest x-ray I independently visualized and interpreted imaging which showed no acute findings I agree with the radiologist interpretation   Cardiac Monitoring: / EKG:  The patient was maintained on a cardiac monitor.  I personally viewed and interpreted the cardiac monitored which showed an underlying rhythm of: Sinus rhythm   Problem List / ED Course / Critical interventions / Medication management  Patient presenting for transient 30-minute episode of palpitations, dizziness, chest tightness, and shortness of breath.  On arrival in the ED, his vital signs are normal.  On cardiac monitor, he is in a normal sinus rhythm with normal rate.  He is normotensive.  He is well-appearing on exam.  Current breathing is unlabored.  No cardiac rubs or murmurs are appreciated on auscultation.  Patient was kept on monitor and workup was initiated.  Patient's lab work was unremarkable.  On cardiac monitor, patient maintained normal sinus rhythm with a normal heart rate.  He remained asymptomatic while in the emergency department.   Patient to follow-up with cardiology.  He was discharged in stable condition.     Final diagnoses:  Palpitations  ED Discharge Orders          Ordered    Ambulatory referral to Cardiology       Comments: If you have not heard from the Cardiology office within the next 72 hours please call [phone].   05/10/24 0009               Melvenia Motto, MD 05/10/24 0011

## 2024-05-09 NOTE — ED Triage Notes (Signed)
 Patient endorses feeling dizzy, with heart palpitations and sob that started 30 mins ago.  Patient took a nitroglycerin  and now has a headache.

## 2024-05-10 LAB — TROPONIN T, HIGH SENSITIVITY: Troponin T High Sensitivity: <15 ng/L (ref 0–19)

## 2024-05-10 NOTE — Discharge Instructions (Addendum)
 Your test results today were reassuring.  Follow-up with cardiology for consideration of repeat cardiac monitor.  If you do not hear from their office in the next 2 days, call telephone number below.  Return to the emergency department for any new or worsening symptoms of concern.

## 2025-03-19 ENCOUNTER — Ambulatory Visit: Admitting: Urology
